# Patient Record
Sex: Female | Born: 1950
Health system: Southern US, Community
[De-identification: ages and names within clinical notes are randomized; demographics above are authoritative.]

## PROBLEM LIST (undated history)

## (undated) DIAGNOSIS — J302 Other seasonal allergic rhinitis: Secondary | ICD-10-CM

## (undated) DIAGNOSIS — I10 Essential (primary) hypertension: Secondary | ICD-10-CM

## (undated) DIAGNOSIS — C801 Malignant (primary) neoplasm, unspecified: Secondary | ICD-10-CM

## (undated) DIAGNOSIS — M5430 Sciatica, unspecified side: Secondary | ICD-10-CM

## (undated) DIAGNOSIS — M1990 Unspecified osteoarthritis, unspecified site: Secondary | ICD-10-CM

## (undated) DIAGNOSIS — K5792 Diverticulitis of intestine, part unspecified, without perforation or abscess without bleeding: Secondary | ICD-10-CM

## (undated) DIAGNOSIS — F32A Depression, unspecified: Secondary | ICD-10-CM

## (undated) DIAGNOSIS — R82998 Other abnormal findings in urine: Secondary | ICD-10-CM

## (undated) DIAGNOSIS — B019 Varicella without complication: Secondary | ICD-10-CM

## (undated) DIAGNOSIS — E079 Disorder of thyroid, unspecified: Secondary | ICD-10-CM

## (undated) DIAGNOSIS — F329 Major depressive disorder, single episode, unspecified: Secondary | ICD-10-CM

## (undated) DIAGNOSIS — I639 Cerebral infarction, unspecified: Secondary | ICD-10-CM

## (undated) HISTORY — DX: Disorder of thyroid, unspecified: E07.9

## (undated) HISTORY — DX: Varicella without complication: B01.9

## (undated) HISTORY — DX: Other abnormal findings in urine: R82.998

## (undated) HISTORY — DX: Other seasonal allergic rhinitis: J30.2

## (undated) HISTORY — DX: Malignant (primary) neoplasm, unspecified: C80.1

## (undated) HISTORY — DX: Diverticulitis of intestine, part unspecified, without perforation or abscess without bleeding: K57.92

## (undated) HISTORY — DX: Depression, unspecified: F32.A

## (undated) HISTORY — DX: Unspecified osteoarthritis, unspecified site: M19.90

## (undated) HISTORY — DX: Major depressive disorder, single episode, unspecified: F32.9

## (undated) HISTORY — PX: WISDOM TOOTH EXTRACTION: SHX21

## (undated) HISTORY — DX: Cerebral infarction, unspecified: I63.9

---

## 1977-03-15 HISTORY — PX: MINOR BREAST BIOPSY: SHX5977

## 2002-07-16 ENCOUNTER — Emergency Department (HOSPITAL_COMMUNITY): Admission: EM | Admit: 2002-07-16 | Discharge: 2002-07-16 | Payer: Self-pay | Admitting: Emergency Medicine

## 2003-04-13 ENCOUNTER — Observation Stay (HOSPITAL_COMMUNITY): Admission: AD | Admit: 2003-04-13 | Discharge: 2003-04-14 | Payer: Self-pay | Admitting: Family Medicine

## 2003-05-15 ENCOUNTER — Encounter: Admission: RE | Admit: 2003-05-15 | Discharge: 2003-05-15 | Payer: Self-pay | Admitting: Family Medicine

## 2008-09-12 DIAGNOSIS — I639 Cerebral infarction, unspecified: Secondary | ICD-10-CM

## 2008-09-12 HISTORY — DX: Cerebral infarction, unspecified: I63.9

## 2008-09-24 ENCOUNTER — Ambulatory Visit: Payer: Self-pay | Admitting: Cardiology

## 2008-09-24 ENCOUNTER — Inpatient Hospital Stay (HOSPITAL_COMMUNITY): Admission: EM | Admit: 2008-09-24 | Discharge: 2008-09-27 | Payer: Self-pay | Admitting: Emergency Medicine

## 2008-09-25 ENCOUNTER — Encounter (INDEPENDENT_AMBULATORY_CARE_PROVIDER_SITE_OTHER): Payer: Self-pay | Admitting: Internal Medicine

## 2008-09-25 ENCOUNTER — Encounter (INDEPENDENT_AMBULATORY_CARE_PROVIDER_SITE_OTHER): Payer: Self-pay | Admitting: Emergency Medicine

## 2008-09-25 ENCOUNTER — Encounter (INDEPENDENT_AMBULATORY_CARE_PROVIDER_SITE_OTHER): Payer: Self-pay | Admitting: Neurology

## 2008-09-26 ENCOUNTER — Ambulatory Visit: Payer: Self-pay | Admitting: Surgery

## 2008-09-26 ENCOUNTER — Encounter (INDEPENDENT_AMBULATORY_CARE_PROVIDER_SITE_OTHER): Payer: Self-pay | Admitting: Internal Medicine

## 2008-10-02 ENCOUNTER — Telehealth (INDEPENDENT_AMBULATORY_CARE_PROVIDER_SITE_OTHER): Payer: Self-pay

## 2008-10-03 ENCOUNTER — Ambulatory Visit: Payer: Self-pay

## 2008-10-03 ENCOUNTER — Encounter: Payer: Self-pay | Admitting: Cardiology

## 2008-10-04 ENCOUNTER — Telehealth (INDEPENDENT_AMBULATORY_CARE_PROVIDER_SITE_OTHER): Payer: Self-pay | Admitting: *Deleted

## 2008-12-02 DIAGNOSIS — J309 Allergic rhinitis, unspecified: Secondary | ICD-10-CM | POA: Insufficient documentation

## 2008-12-04 ENCOUNTER — Ambulatory Visit: Payer: Self-pay | Admitting: Cardiology

## 2008-12-04 DIAGNOSIS — I214 Non-ST elevation (NSTEMI) myocardial infarction: Secondary | ICD-10-CM

## 2008-12-04 DIAGNOSIS — I252 Old myocardial infarction: Secondary | ICD-10-CM | POA: Insufficient documentation

## 2008-12-04 DIAGNOSIS — E669 Obesity, unspecified: Secondary | ICD-10-CM | POA: Insufficient documentation

## 2009-02-03 ENCOUNTER — Encounter: Admission: RE | Admit: 2009-02-03 | Discharge: 2009-02-03 | Payer: Self-pay | Admitting: Family Medicine

## 2009-02-18 ENCOUNTER — Ambulatory Visit: Payer: Self-pay | Admitting: Cardiology

## 2009-02-19 ENCOUNTER — Telehealth (INDEPENDENT_AMBULATORY_CARE_PROVIDER_SITE_OTHER): Payer: Self-pay | Admitting: *Deleted

## 2009-06-03 ENCOUNTER — Encounter: Admission: RE | Admit: 2009-06-03 | Discharge: 2009-06-03 | Payer: Self-pay | Admitting: Family Medicine

## 2009-06-19 ENCOUNTER — Ambulatory Visit: Payer: Self-pay | Admitting: Cardiology

## 2009-10-24 ENCOUNTER — Ambulatory Visit: Payer: Self-pay | Admitting: Cardiology

## 2009-10-24 DIAGNOSIS — E785 Hyperlipidemia, unspecified: Secondary | ICD-10-CM | POA: Insufficient documentation

## 2010-04-14 NOTE — Assessment & Plan Note (Signed)
Summary: 3 month rov 401.1  pfh,rn   Visit Type:  Follow-up Primary Provider:  None  CC:  HTN.  History of Present Illness: The patient presents for follow up of hypertension. Since I last saw her she says that her home blood pressure readings have been in the 130s over 80s. She has had no chest pressure, neck or arm discomfort. She has had no palpitations, presyncope or syncope. She has had no PND or orthopnea. She has been through the stress of the death of her mother and says that at times her blood pressures been creeping up a little bit.  Current Medications (verified): 1)  Hydrochlorothiazide 25 Mg Tabs (Hydrochlorothiazide) .... Daily As Needed Per Pt 2)  Bystolic 10 Mg Tabs (Nebivolol Hcl) .... One Daily 3)  Vitamin D3 400 Unit Tabs (Cholecalciferol) .Marland Kitchen.. 1 By Mouth Daily 4)  Coq10 30 Mg Caps (Coenzyme Q10) .Marland Kitchen.. 1 By Mouth Dialy 5)  Vitamin C 500 Mg  Tabs (Ascorbic Acid) .Marland Kitchen.. 1 By Mouth Daily 6)  Chlorella .Marland Kitchen.. 1 Tsp. Daily  Allergies (verified): 1)  ! Penicillin 2)  ! Sulfa  Past History:  Past Medical History: Reviewed history from 12/04/2008 and no changes required. 1. Acute left-sided cerebrovascular accident. (2010) 2. Dyslipidemia. 3. Elevated troponin. 4. Hypertensive urgency (accelerated hypertension). 5. Hypokalemia. 6. Elevated TSH. 7. Obesity. 8. LVH    Past Surgical History: Reviewed history from 12/02/2008 and no changes required.  Prior breast biopsy.   Review of Systems       As stated in the HPI and negative for all other systems.   Vital Signs:  Patient profile:   60 year old female Height:      64 inches Weight:      197 pounds BMI:     33.94 Pulse rate:   62 / minute Resp:     18 per minute BP sitting:   148 / 98  (right arm)  Vitals Entered By: Marrion Coy, CNA (October 24, 2009 9:52 AM)  Physical Exam  General:  Well developed, well nourished, in no acute distress. Head:  normocephalic and atraumatic Eyes:  PERRLA/EOM intact;  conjunctiva and lids normal. Neck:  Neck supple, no JVD. No masses, thyromegaly or abnormal cervical nodes. Chest Wall:  no deformities or breast masses noted Lungs:  Clear bilaterally to auscultation and percussion. Heart:  Non-displaced PMI, chest non-tender; regular rate and rhythm, S1, S2 without murmurs, rubs or gallops. Carotid upstroke normal, no bruit. Normal abdominal aortic size, no bruits. Femorals normal pulses, no bruits. Pedals normal pulses. No edema, no varicosities. Abdomen:  Bowel sounds positive; abdomen soft and non-tender without masses, organomegaly, or hernias noted. No hepatosplenomegaly, obese Msk:  Back normal, normal gait. Muscle strength and tone normal. Extremities:  No clubbing or cyanosis. Neurologic:  Alert and oriented x 3. Skin:  Intact without lesions or rashes. Cervical Nodes:  no significant adenopathy Psych:  Normal affect.   EKG  Procedure date:  10/24/2009  Findings:      Sinus rhythm, rate 62, left ventricular hypertrophy, premature and treated in traction, poor anterior R-wave progression  Impression & Recommendations:  Problem # 1:  HYPERTENSION (ICD-401.9) She says that on occasional weeks her blood pressure may be elevated slightly. I discussed with her possibly taking 12.5 mg of HCTZ in addition to her other meds on those weeks and watching her salt. She'll keep a close eye on her blood pressure and use this therapy as needed. Hopefully if she continues to lose  weight and exercise blood pressures will continue to be at target. Orders: EKG w/ Interpretation (93000)  Problem # 2:  OBESITY, UNSPECIFIED (ICD-278.00) I applaud her weight loss to date and encourage more of the same.  Problem # 3:  DYSLIPIDEMIA (ICD-272.4) She will get a lipid profile in about 6 months with liver enzymes. This will be either prior to or at the time of the next appointment.  Patient Instructions: 1)  Your physician recommends that you schedule a follow-up  appointment in: 6 months with Dr Antoine Poche 2)  Your physician recommends that you continue on your current medications as directed. Please refer to the Current Medication list given to you today.

## 2010-04-14 NOTE — Assessment & Plan Note (Signed)
Summary: per check out/sf   Visit Type:  Follow-up Primary Provider:  None  CC:  HTN.  History of Present Illness: The patient presents for followup of hypertension. Since the last appointment her blood pressure has predominantly been better controlled though she reports systolics in the 150s typically. She overall is feeling better. She's been exercising. She been watching her diet and has lost 7 pounds. She does notice that if she eats salt her blood pressure tends to go up. She is having no new chest pressure, neck or arm discomfort. She's having no palpitations, presyncope or syncope. She is having no PND or orthopnea. She tolerated the increased dose of diastolic though she notices her heart rate staying in the 50s. She's not having symptoms related to this.  Current Medications (verified): 1)  Simvastatin 10 Mg Tabs (Simvastatin) .... Daily 2)  Hydrochlorothiazide 25 Mg Tabs (Hydrochlorothiazide) .... Daily As Needed Per Pt 3)  Aspirin 325 Mg  Tabs (Aspirin) .... Daily 4)  Bystolic 10 Mg Tabs (Nebivolol Hcl) .... One Daily  Allergies (verified): 1)  ! Penicillin 2)  ! Sulfa  Past History:  Past Medical History: Reviewed history from 12/04/2008 and no changes required. 1. Acute left-sided cerebrovascular accident. (2010) 2. Dyslipidemia. 3. Elevated troponin. 4. Hypertensive urgency (accelerated hypertension). 5. Hypokalemia. 6. Elevated TSH. 7. Obesity. 8. LVH    Past Surgical History: Reviewed history from 12/02/2008 and no changes required.  Prior breast biopsy.   Review of Systems       As stated in the HPI and negative for all other systems.   Vital Signs:  Patient profile:   60 year old female Height:      64 inches Weight:      198 pounds BMI:     34.11 Pulse rate:   76 / minute BP sitting:   153 / 95 Cuff size:   large  Vitals Entered By: Burnett Kanaris, CNA (June 19, 2009 3:24 PM)  Physical Exam  General:  Well developed, well nourished, in no  acute distress. Head:  normocephalic and atraumatic Eyes:  PERRLA/EOM intact; conjunctiva and lids normal. Neck:  Neck supple, no JVD. No masses, thyromegaly or abnormal cervical nodes. Chest Wall:  no deformities or breast masses noted Lungs:  Clear bilaterally to auscultation and percussion. Abdomen:  Bowel sounds positive; abdomen soft and non-tender without masses, organomegaly, or hernias noted. No hepatosplenomegaly. Msk:  Back normal, normal gait. Muscle strength and tone normal. Extremities:  No clubbing or cyanosis. Neurologic:  Alert and oriented x 3. Psych:  Normal affect.   Detailed Cardiovascular Exam  Heart    Inspection: no deformities or lifts noted.      Palpation: normal PMI with no thrills palpable.      Auscultation: S1 and S2 within normal limits, no S3, positive S4, no clicks, no rubs, no murmurs.  Vascular    Abdominal Aorta: no palpable masses, pulsations, or audible bruits.      Femoral Pulses: normal femoral pulses bilaterally.      Pedal Pulses: pulses normal in all 4 extremities    Radial Pulses: normal radial pulses bilaterally.      Peripheral Circulation: no clubbing, cyanosis, or edema noted with normal capillary refill.     EKG  Procedure date:  06/19/2009  Findings:      sinus rhythm, rate 76, left axis deviation, left ventricular hypertrophy with repolarization changes, poor anterior R-wave progression, no change from previous  Impression & Recommendations:  Problem # 1:  HYPERTENSION (ICD-401.9) We had a long discussion about this. She does not want to take another medication at this point and wants very much to pursue lifestyle changes to treat her blood pressure. We discussed strategies for this. If in 3 months she has not had a target of 140/90 or less I will add another agent. Orders: EKG w/ Interpretation (93000)  Problem # 2:  OBESITY, UNSPECIFIED (ICD-278.00) We discussed weight loss strategies and I'm proud of her progress to  date.  Patient Instructions: 1)  Your physician recommends that you schedule a follow-up appointment in: 3 months with Dr Antoine Poche 2)  Your physician recommends that you continue on your current medications as directed. Please refer to the Current Medication list given to you today.

## 2010-06-21 LAB — POCT I-STAT, CHEM 8
BUN: 17 mg/dL (ref 6–23)
Chloride: 104 mEq/L (ref 96–112)
Potassium: 3.1 mEq/L — ABNORMAL LOW (ref 3.5–5.1)
Sodium: 141 mEq/L (ref 135–145)

## 2010-06-21 LAB — POCT CARDIAC MARKERS
CKMB, poc: 2.4 ng/mL (ref 1.0–8.0)
Myoglobin, poc: 158 ng/mL (ref 12–200)
Troponin i, poc: 0.08 ng/mL (ref 0.00–0.09)

## 2010-06-21 LAB — DIFFERENTIAL
Basophils Absolute: 0 10*3/uL (ref 0.0–0.1)
Lymphocytes Relative: 19 % (ref 12–46)
Monocytes Absolute: 0.8 10*3/uL (ref 0.1–1.0)
Neutro Abs: 7.7 10*3/uL (ref 1.7–7.7)

## 2010-06-21 LAB — LIPID PANEL
Cholesterol: 200 mg/dL (ref 0–200)
HDL: 61 mg/dL (ref >39)
Triglycerides: 84 mg/dL (ref <150)

## 2010-06-21 LAB — CK TOTAL AND CKMB (NOT AT ARMC): Total CK: 579 U/L — ABNORMAL HIGH (ref 7–177)

## 2010-06-21 LAB — MAGNESIUM: Magnesium: 2.3 mg/dL (ref 1.5–2.5)

## 2010-06-21 LAB — CBC
Hemoglobin: 14.3 g/dL (ref 12.0–15.0)
Platelets: 170 10*3/uL (ref 150–400)
RDW: 13.5 % (ref 11.5–15.5)

## 2010-06-21 LAB — CARDIAC PANEL(CRET KIN+CKTOT+MB+TROPI)
Relative Index: 0.6 (ref 0.0–2.5)
Troponin I: 0.32 ng/mL — ABNORMAL HIGH (ref 0.00–0.06)

## 2010-06-21 LAB — SEDIMENTATION RATE: Sed Rate: 2 mm/hr (ref 0–22)

## 2010-06-21 LAB — TSH: TSH: 8.145 u[IU]/mL — ABNORMAL HIGH (ref 0.350–4.500)

## 2010-06-21 LAB — POTASSIUM: Potassium: 3.3 mEq/L — ABNORMAL LOW (ref 3.5–5.1)

## 2010-07-28 NOTE — Discharge Summary (Signed)
[name], [name]       ACCOUNT NO.:  [id number]   MEDICAL RECORD NO.:  [id number]          PATIENT TYPE:  INP   LOCATION:  3701                         FACILITY:  [facility]   PHYSICIAN:  [name], MD    DATE OF BIRTH:  31-Jan-1951   DATE OF ADMISSION:  09/24/2008  DATE OF DISCHARGE:  09/27/2008                               DISCHARGE SUMMARY   REASON FOR ADMISSION:  Right-sided numbness and peripheral visual loss  in the right eye.   FINAL DISCHARGE DIAGNOSES:  1. Acute left-sided cerebrovascular accident.  2. Dyslipidemia.  3. Elevated troponin.  4. Hypertensive urgency (accelerated hypertension).  5. Hypokalemia.  6. Elevated TSH.  7. Obesity.   CONSULTS DURING THIS ADMISSION:  1. Cardiology consult for elevated troponin.  2. Neurology consult for acute CVA.   PROCEDURES DONE DURING THIS ADMISSION:  CT scan of the head without  contrast was read as having no acute intracranial abnormalities, but  extensive chronic microvascular white matter disease.  There was an old  superior cerebellar infarct and old left basal ganglia lacunar infarct.  MRI of the brain was done subsequent to that and it showed acute infarct  in the left lateral thalamus.  A 2-D echocardiogram was done and showed  65% ejection fraction with no obvious cardiac source of emboli.  Carotid  Doppler was done and showed no significant extracranial carotid artery  stenosis.  Vertebral arteries were patent bilaterally with antegrade  flow.   BRIEF HISTORY OF PRESENT ILLNESS AND HOSPITAL COURSE:  This is a 60-year-  old African American female with a history of untreated hypertension who  came in to the emergency room with complaint of right-sided numbness of  both right upper and lower extremities and also face with peripheral  visual loss on the right eye, and it started 1 hour prior to  presentation in the emergency room.  As noted above, MRI did reveal that  the patient had an acute left  thalamus infarction and the patient was  treated for this.  The patient started on aspirin prior to diagnosis and  this was continued.  Zocor was added because of the patient's  dyslipidemia and her blood pressure was controlled with initiation of  Norvasc and hydrochlorothiazide.  The patient seen by Cardiology because  of elevated troponin and recommendation was for the patient to undergo a  left cardiac cath considering all her risk factors, but she declined  having a left heart catheterization.  A stress Cardiolite study was  therefore recommended.  However, because this could not be done today,  the patient says that she would like to go home and have this done in  the outpatient setting with [facility].  For re-culture, I told  the physician assistant with [facility] by name [name] and she  has assured me that [facility] would contact this patient and  schedule a stress Cardiolite study in the outpatient setting.  We also  consulted Neurology and the opinion was that the patient should also  undergo an MRA of the cranial arteries.  The patient has however  declined any further study at this time.  She has declined the MRA of  the spine, my explanations as to why to help [name] with further management,  and she has also declined even a transcranial Dopplers at this time.  She says that she is tired of being in the hospital and would like to go  home and she is not going to undergo any more study.  She also knows the  fact that she is a claustrophobic to the MRI machine.  Today, the  patient says that she still has slight numbness in the right arm even  though significantly improved from presentation.  Because of this, I  offered for her to stay and have more workup done, especially with the  MRA, but she declined to staying any further.  Because she is not  willing to have any more studies done, we are forced to discharge at  this time, which is actually her wish.   She has no other symptoms.  No  chest pain.  No shortness of breath.  No abdominal pain.  No nausea.  No  vomiting.  No diarrhea.  No constipation.  No dysuria.  No frequency.  No urgency.  She was noted to have elevated TSH, but the free T4 and T3  has not been resulted here.  So, tentatively the patient has  hypothyroidism, but we will be able to diagnose this better when we have  back these two studies.   PHYSICAL EXAMINATION:  VITALS:  Temperature 97.6, pulse 75, respirations  18, blood pressure 130/85, saturating 99% on room air.  CHEST:  Clear to auscultation bilaterally.  ABDOMEN:  Soft, nontender.  EXTREMITIES:  No clubbing, no cyanosis, no edema.  CARDIOVASCULAR:  First and second heart sounds only.  CENTRAL NERVOUS SYSTEM:  Nonfocal.  Cranial nerves intact II through  XII.  There is slight tingling on the right side, but no loss of  sensation.   MEDICATIONS:  She is to go home on the following medications,  1. Norvasc 10 mg p.o. daily.  2. Hydrochlorothiazide 25 mg p.o. daily.  3. Zocor 10 mg p.o. daily.  4. Aspirin 325 mg p.o. daily.   FOLLOWUP:  She is to follow up with primary care physician in 1-2 weeks  with the help of social workers.  She is also to follow with neurologist  Dr. [name] in 2 weeks.  The patient is to call for this appointment.  She is to follow with [facility] for outpatient stress test.  According to my discussion with the physician assistant, Rosalyn Charters  Cardiology would arrange for this stress test and the patient will be  contacted by them.      [name], MD  Electronically Signed     GU/MEDQ  D:  09/27/2008  T:  09/28/2008  Job:  [id number]   cc:   Hillis Range, MD  Dr. Pearlean Brownie

## 2010-07-28 NOTE — H&P (Signed)
[name], [name]       ACCOUNT NO.:  [id number]   MEDICAL RECORD NO.:  [id number]          PATIENT TYPE:  INP   LOCATION:  3704                         FACILITY:  [facility]   PHYSICIAN:  Oswald Hillock, MD        DATE OF BIRTH:  [birth year]   DATE OF ADMISSION:  09/24/2008  DATE OF DISCHARGE:                              HISTORY & PHYSICAL   CHIEF COMPLAINT:  Right-sided numbness and peripheral vision loss in  right eye.   HISTORY OF PRESENT ILLNESS:  The patient is a 60 year old African  American female with history of untreated hypertension who presented to  the emergency room with complaints of right-sided numbness both right  upper as well as lower extremity including the face and associated with  peripheral vision loss in the right eye that started about 1 hour prior  to her arrival in the emergency room.  Apparently, the patient was  exercising at that time.  Denied any chest pain, shortness of breath,  palpitations, dizziness, loss of consciousness, or any focal weakness of  any part of the body.  The numbness that she describes is just a heavy  feeling with associated tingling.  The patient has never had similar  symptoms in the past.   EMERGENCY DEPARTMENT COURSE:  Dr. Doug Sou, the ER physician  evaluated the patient and had CT done, which revealed extensive chronic  microvascular white matter disease and mild cerebellar atrophy, old  superior cerebral artery infarct, and old left basal ganglia lacune.  Her initial blood pressure was 197/114, which later on improved and as  her blood pressure improved, her symptoms got better.  However, at the  time of this interview, her blood pressure is back up to 209/119 and she  is getting recurrence of her symptoms, though her vision is intact at  this time.  She denies having had similar symptoms in the past, states  that she has been otherwise healthy.  She did have an admission in the  hospital here in 2005 for  diagnosis of chest pain.   PAST MEDICAL HISTORY:  Hypertension.  The patient had been on  hydrochlorothiazide in the past, but has stopped taking it.   PAST SURGICAL HISTORY:  History of breast biopsy.   CURRENT MEDICATIONS:  None.   ALLERGIES:  PENICILLIN.   SOCIAL HISTORY:  The patient denies alcohol, tobacco, or drug use.  She  lives with the family.  She moved to [place] from [place].   FAMILY HISTORY:  Father has history of hypertension and kidney stone.  Her mother has congestive heart failure, hypertension, diabetes, and  osteoarthritis and siblings have asthma, diabetes, and hypertension.   REVIEW OF SYSTEMS:  An extensive system review of systems was done and  all systems are negative except for the positives as mentioned in the  history of present illness.  The patient reports of some weight gain in  the recent past.   PHYSICAL EXAMINATION:  VITAL SIGNS:  On admission, pulse 104, blood  pressure 190/114, respiratory rate of 14, and temperature 98.1 and O2  sats of 99% on 2 L nasal cannula.  At the  time of this interview, her  blood pressure is 209/119.  GENERAL:  The patient is consciousness, alert, oriented to time, place,  and person, in no acute distress.  HEENT:  No scleral icterus.  No pallor.  Ears, negative.  Poor dental  hygiene.  NECK:  Supple.  No lymphadenopathy.  No JVD.  No carotid bruits.  CHEST:  Breath sounds heard bilaterally.  Good air entry.  No  adventitious sounds.  CVS:  S1 and S2 plus regular.  No gallop or rub.  ABDOMEN:  Soft and nontender.  Bowel sounds present.  EXTREMITIES:  No cyanosis, clubbing, or edema.  NEUROLOGIC:  The patient's fundus exam did not reveal any acute  hemorrhages.  The patient does have intact vision at the time of this  interview in both eyes.  No upper and cranial nerve palsy.  Motor  examination, power grade 5/5 all over.  Reflexes are present.  Plantars  are bilaterally downgoing.  Sensations are present to  both light touch  and pain.  The patient still complained of a right-sided numbness and  tingling at the time of this interview.  Gait was within normal limits.   LABORATORY DATA:  CT scan of the of the head revealed extensive chronic  microvascular white disease and mild cerebellar atrophy.   Old superior cerebral artery infarct in old left basal ganglia lacune.  CK was 158, MB 2.4, troponin 0.08.  sodium 141, potassium 3.1, chloride  104, CO2 is 26, BUN 17, creatinine 1.2, WBC count 1.07, hemoglobin 14.3,  hematocrit 41.7, platelet count of 170.  EKG showed sinus tach and left  axis deviation, LVH and nonspecific ST-T wave changes.  No ST elevation  noted.  Significant changes of LVH and some T-wave changes compared with  previous EKGs.   IMPRESSION AND PLAN:  This is a case of 60 year old African American  female with history of untreated hypertension who presents with sudden  onset of right-sided numbness and peripheral vision loss in the right  eye.  1. Right numbness, peripheral vision loss and uncontrolled      hypertension.  There is concern for hypertensive encephalopathy,      given the transient nature of the symptoms and relation to elevated      blood pressure as mentioned above.  The patient's symptoms have      recurred after her blood pressure has gone back up.  We will start      her on a nicardipine drip with goal to titrate her blood pressure      down to 160-170 systolic and 100-110 diastolic at this time.  She      has already received aspirin in the ER and this case was discussed      with the E- link doctor on call for the critical care medicine and      he is in agreement with this.  We will start baseline stroke workup      and get an MRI of the brain and get baseline labs including ESR,      CRP as well as a TSH, fasting lipid panel, hemoglobin A1c, and      homocysteine level.  We will check bilateral carotid Dopplers with      2D echocardiogram and  transcranial Dopplers and followup with her      results.  Neurology will need to be consulted in the morning.  2. Nonspecific EKG changes.  We will admit the patient to telemetry,  recycle enzymes, and followup.  3. Hypokalemia.  The patient received supplementation in the ER, we      will monitor.  4. Deep venous thrombosis/gastrointestinal prophylaxis.  Protonix and      subcu heparin.  5. History of old cerebrovascular accidents with low deficits.  The      patient does have clear old cerebellar and old left basal ganglia      lacunar infarct.  However, she states that she has never had any      symptoms as a result.  As mentioned above, extensive stroke workup      will be done and the patient will be started on aspirin and we may      need to add Plavix to her regimen as well.   Also, case was discussed in detail with Dr. Ethelda Chick, the ER  physician, who refused to consult Neurology.  I discussed the case in  detail with the critical care doctor on call for eLink, and he will  monitor the patient in the ICU.      Oswald Hillock, MD  Electronically Signed     BA/MEDQ  D:  09/24/2008  T:  09/25/2008  Job:  [id number]   cc:   Doug Sou, M.D.  Primcary Care Physician

## 2010-07-28 NOTE — Consult Note (Signed)
NAME:  [name], [name] NO.:  [id number]   MEDICAL RECORD NO.:  [id number]          PATIENT TYPE:  INP   LOCATION:  3704                         FACILITY:  [facility]   PHYSICIAN:  Hillis Range, MD       DATE OF BIRTH:  [birth year]   DATE OF CONSULTATION:  DATE OF DISCHARGE:                                 CONSULTATION   REQUESTING PHYSICIAN:  Hospitalist Team E.   HISTORY OF PRESENT ILLNESS:  [name] is a 60 year old  female with a history of hypertension and obesity who was admitted with  an acute stroke and observed to have elevated cardiac markers.  The  patient reports living a very sedentary lifestyle, but recently has  desired to become more active.  She has had intermittent shortness of  breath chronically.  Over the past few days, she has attempted to do  exercise.  She notes that on July 13 she participated in an exercise  class, which involved sprinting, walking, and running.  She notes that  she developed recurrent shortness of breath during the exercise and had  to drop out of activities on multiple occasions.  She denies chest pain,  arm/jaw pain or other cardiac symptoms.  She subsequently developed  unsteadiness when walking and visual disturbance.  She was brought to  the [facility] for further evaluation.  Upon arrival, she  developed peripheral vision loss within the right eye.  She was found to  have an acute stroke.  Her initial blood pressure was 200/110.  An EKG  at that time revealed sinus rhythm with left ventricular hypertrophy,  septal infarct with ST elevation in V1 through V3.  The patient denies  any symptoms of chest pain or shortness of breath at that particular  time.  She was admitted for further management.  Her neurologic symptoms  have resolved and she has been treated with aspirin.  She is now resting  comfortably.  She denies chest pain, further shortness of breath,  palpitations, presyncope, or syncope.  She  has chronic intermittent  lower extremity edema, but denies orthopnea or PND.   PAST MEDICAL HISTORY:  1. Hypertension.  2. Allergic rhinitis.   SURGERIES:  Prior breast biopsy.   ALLERGIES:  PENICILLIN.   HOME MEDICATIONS:  None.   SOCIAL HISTORY:  The patient lives in [place].  She is widowed.  She  denies tobacco, alcohol, or drug use.  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   FAMILY HISTORY:  The patient has multiple family members with diabetes  and hypertension.  Her mother had congestive heart failure in her 4s.  The patient is unaware of any significant family history of premature  coronary artery disease.   REVIEW OF SYSTEMS:  All systems are reviewed and negative except as  outlined in the HPI above.   PHYSICAL EXAMINATION:  Telemetry reveals sinus rhythm.  VITAL SIGNS:  Blood pressure 147/101, heart rate 82, respirations 18,  and sats 97% on room air, afebrile.  GENERAL:  The patient is an obese female who is  anxious, but in no acute  distress.  She is alert and oriented x3.  HEENT:  Normocephalic and atraumatic.  Sclerae clear.  Conjunctivae  pink.  Oropharynx clear.  NECK:  Supple.  No thyromegaly, JVD, or bruits.  LUNGS:  Clear to auscultation bilaterally.  HEART:  Regular rate and rhythm.  No murmurs, rubs, or gallops.  GASTROINTESTINAL:  Soft, nontender, nondistended.  Positive bowel  sounds.  EXTREMITIES:  No clubbing, cyanosis, or edema.  NEUROLOGIC:  Strength and sensation are intact.  SKIN:  No ecchymosis or lacerations.  MUSCULOSKELETAL:  No deformity or atrophy.  PSYCHIATRIC:  Euthymic mood.  Flat affect.   EKG today revealed sinus rhythm at 90 beats per minute with left  ventricular hypertrophy and early repolarization abnormalities.  I  reviewed her admission EKG, which reveals as described above sinus  rhythm with left ventricular hypertrophy.  She has a septal  infarct  pattern with ST elevation in V1 through V3 of unclear significance.   LABORATORY DATA:  Peak troponin 0.48, peak CK 579, peak MB 4.1, total  cholesterol 200, triglycerides 84, LDL 122, HDL 61, TSH 8.145, and  hemoglobin A1c 5.1.   Echocardiogram reveals a preserved ejection fraction with moderate left  ventricular hypertrophy.   Chest x-ray has not been performed.  A head CT from September 24, 2008,  reveals extensive microvascular disease and mild cerebral hypertrophy  and old superior cerebellar infarct and old left basal ganglia lacune is  noted.   MRI from September 25, 2008, reveals an acute infarct in the left lateral  thalamus with moderately severe chronic microvascular ischemia in the  white matter.   IMPRESSION:  [name] is a 60 year old female with obesity  and hypertension who is admitted with an acute stroke.  She reports  symptoms of shortness of breath over the past few days, but has not had  chest discomfort.  During her hospital stay, her cardiac enzymes have  revealed a troponin of 0.48.  Though, she has had elevation of her CK  and CK-MB, these are not of a traditional cardiac origin.  The patient  has moderate left ventricular hypertrophy, but a preserved ejection  fraction.  She has multiple risk factors for coronary artery disease  including a sedentary lifestyle, obesity, and hypertension.  Given the  patient's elevated troponin in the setting of hypertensive urgency and  an acute stroke, I am concerned for coronary artery disease.  She also  has cerebrovascular disease previously documented.  I have recommended  left heart catheterization to further evaluate the patient's coronary  anatomy.  We had a long discussion regarding risks, benefits, and  alternatives to left heart catheterization.  Presently, the patient  declines catheterization, but would be willing to proceed with a stress  test.  If her exercise stress test is high risk then she  might consider  heart catheterization; however, if it is low risk then she would opt for  medical therapy at this time.  We will ask Neurology for their input  regarding the patient's recent stroke and appropriate therapies.  We  will also ask them to advice [name] regarding the timing of her stress test.  The patient has been initiated on Norvasc for her hypertension and also  Zocor for elevation in her LDL.  We will follow the patient during her  hospital stay.   PLAN:  1. GXT Myoview.  2. Norvasc 10 mg daily.  3. Aspirin 325 mg daily.  4. Zocor 10 mg at  bedtime.  5. Neurologic consultation.      Hillis Range, MD  Electronically Signed     JA/MEDQ  D:  09/26/2008  T:  09/26/2008  Job:  [id number]   cc:   Hospitalist Service

## 2010-07-28 NOTE — Consult Note (Signed)
[name], [name]       ACCOUNT NO.:  [id number]   MEDICAL RECORD NO.:  [id number]          PATIENT TYPE:  INP   LOCATION:  3704                         FACILITY:  [facility]   PHYSICIAN:  Casimiro Needle L. Reynolds, M.D.DATE OF BIRTH:  19-Mar-1950   DATE OF CONSULTATION:  09/26/2008  DATE OF DISCHARGE:                                 CONSULTATION   CHIEF COMPLAINT:  Right-sided weakness with left thalamic  cerebrovascular accident.   HISTORY OF PRESENT ILLNESS:  This is a 60 year old African American  female with untreated hypertension.  The patient was presented  previously on hydrochlorothiazide, but stopped this medication on her  own in the past.  Apparently, the patient on September 24, 2008 was taking  part in a boot camp at her gym at that time they were doing strenuous  exercise.  The patient's class started about 6 p.m. and the patient  states that she was last felt normal was 6:15 p.m.  At approximately  6:15, she noticed that she was also feeling as though she was having  high blood pressure, sweating profusely and did not feel at baseline.  She pulled herself out of the class, kneel down at the edges of the gym  for approximately 1 minute.  At that time, the patient noticed that her  right visual field became blurry and she started seeing black spots in  her right visual field.  The patient was then escorted to a couch where  she laid down put her feet up.  Soon after she noted that her right side  was tingling from the right lower face down to her right foot.  At that  time, EMS was called.  Due to the patient's symptoms not improving, she  was brought to the emergency room.  At the emergency room, the patient  states that she was unable to read the right aspect of a chart placed in  front of her on arrival; however, within 30 to 40-minute, she states her  visual field came back.  Unfortunately her right-sided numbness did not  improve.  The patient at that time was admitted.   On consultation, the  patient is walking around the room.  She states her visual field has no  deficits.  She is left with a slight tingling in her right fingertips  and right toes and the right side of her face does not feel back to  baseline.  It is noted the patient has no dysarthria.  Her gait is  within normal limits and she is able to do fine motor movements that she  is speaking up washcloths and placing pens on her table.   PAST MEDICAL HISTORY:  1. Hypertension.  2. Breast biopsy.  3. History of stroke of unknown timing.   Stroke risk factors include hypertension, hyperlipidemia, obesity.   MEDICATIONS AT HOME:  She was on no medications, did not take aspirin.  At this time, the patient is on amlodipine 10 mg, aspirin 325 mg daily,  heparin 5000 units q.8 h., Protonix 40 mg q.12 h., hydralazine 10 mg q.6  h.   ALLERGIES:  PENICILLIN.   FAMILY HISTORY:  Father had hypertension and kidney stones.  Mother had  congestive heart failure, hypertension, and diabetes.  Siblings have  asthma, diabetes, and hypertension.   SOCIAL HISTORY:  She lives with her family.  She states she does not  drink, smoke, or do illicit drugs.   REVIEW OF SYSTEMS:  Negative except for the above.   PHYSICAL EXAMINATION:  VITAL SIGNS:  Temperature 97.8, blood pressure  147/101, heart rate is 80, respiration 16, O2 sats 100%.  GENERAL:  Lungs are clear to auscultation bilaterally.  No rhonchi or  wheezing.  ABDOMEN:  Soft, nontender, nondistended.  Bowel sounds in all four  quadrants.  CARDIOVASCULAR:  Regular rate and rhythm.  S1 and S2 audible.  No bruits  audible.  NECK:  Negative for bruits bilaterally.  NEUROLOGIC:  Cranial nerves II through XII are intact.  Pupils are  equal, round, and reactive accommodating to light.  Extraocular muscles  are intact.  The patient has no facial droop.  Sensation V1 to V3  bilaterally, grossly intact.  Tongue is midline.  The patient is  negative for  drift upper or lower extremities bilaterally.  Muscle  strength is 5/5 globally.  Finger-to-nose, heel-to-shin is smooth  bilaterally.  Sensation, the patient notes a decrease in sensation on  her right dorsum of her hand and the right dorsum of her foot.  Visual  fields are grossly intact at this time.  The patient's gait is smooth.  normal arm swing, normal stands.  The patient has no problem with  Romberg or balance.   LABORATORY STUDIES:  HbA1c is 5.1, CK is 440, CK-MB is 2.8, troponin  0.27, TSH is high at 8.145.  Sodium 141, potassium 3.1, chloride 104,  BUN 17, creatinine 1.2, glucose 102.  White blood cell count is 10.7,  platelets 170, hemoglobin and hematocrit 15.0 and 44.0.  Cholesterol  200, triglycerides 84, LDL is 182, HDL is 61.  EKG shows normal sinus  rhythm.  CT of head shows extensive microvascular white matter disease,  old superior cerebellum infarct and old left basal ganglia lacuna.  MRI  shows acute left lateral thalamus infarct.   ASSESSMENT:  This is a 60 year old African American female with acute  small vessel/hypertensive thalamic left cerebrovascular accident.  At  this time, recommendations are blood pressure control, aspirin daily  with we control correct hypothyroidism.  We will obtain an MRA of the  brain and transcranial Doppler.  At this time, the carotid Dopplers and  2-D echo are pending.   Thank you very much for this consultation.     ______________________________  Felicie Morn, PA-C      Marolyn Hammock. Thad Ranger, M.D.  Electronically Signed    DS/MEDQ  D:  09/26/2008  T:  09/27/2008  Job:  [id number]   cc:   [facility] team E

## 2010-07-31 NOTE — Discharge Summary (Signed)
[name], [name]       ACCOUNT NO.:  [id number]   MEDICAL RECORD NO.:  [id number]          PATIENT TYPE:  INP   LOCATION:  3701                         FACILITY:  [facility]   PHYSICIAN:  [name], MD    DATE OF BIRTH:  06-18-1950   DATE OF ADMISSION:  09/24/2008  DATE OF DISCHARGE:  09/27/2008                               DISCHARGE SUMMARY   ADDENDUM:  This addendum is to address the issue of elevated troponin  versus possible acute myocardial infarction.  As can be seen by the  final discharge diagnosis, we did not really think this patient had  acute myocardial infarction.  We did believe that her elevated troponin  was from accelerated hypertension.  Because of this, beta-blocker was  not prescribed at the time of discharge.  The patient was to go for  stress Cardiolite study and a possible left heart catheterization with  [facility] in the outpatient setting.  This was desired.  Again,  because we did not a confirmed diagnosis of acute myocardial infarction  and thought the elevated troponin was due to hypertensive urgency, beta-  blocker was not mandatorily prescribed at discharge.  Further evaluation  would be at [facility] post stress test and a possible left  heart catheterization.      [name], MD  Electronically Signed     GU/MEDQ  D:  10/09/2008  T:  10/09/2008  Job:  [id number]

## 2010-07-31 NOTE — H&P (Signed)
[name], VARI                   ACCOUNT NO.:  [id number]   MEDICAL RECORD NO.:  [id number]                   PATIENT TYPE:  INP   LOCATION:  4734                                 FACILITY:  [facility]   PHYSICIAN:  Santiago Bumpers. Hensel, M.D.             DATE OF BIRTH:  1950-07-15   DATE OF ADMISSION:  04/13/2003  DATE OF DISCHARGE:  04/14/2003                                HISTORY & PHYSICAL   CHIEF COMPLAINT:  Chest pressure x2-3 days.   HISTORY OF PRESENT ILLNESS:  A 60 year old black female with a history of  hypertension reports 2-3 days of intermittent chest pressure, left-sided,  occurring at rest, lasting typically a few minutes but at max an hour and a  half.  Feels like the pressure is deep inside, suspended, between her  chest and back.  Sometimes this same feeling extends into the left side of  her neck or deeper into the left side of her back or into the right anterior  chest.  No radiation down the arms.  No shortness of breath.  No nausea,  vomiting, or diaphoresis associated with the pain.  The pain does get better  with resting.  It is not aggravated by anything the patient can remember.  No history of similar pain in the past.  Patient blames her complaints on  not getting enough rest recently.  She is currently without chest pain in  the emergency room.   PAST MEDICAL HISTORY:  1. Hypertension:  She was prescribed hydrochlorothiazide in the past but     stopped taking it years ago secondary to difficulties with insurance and     finances.  2. Spontaneous vaginal delivery x2 many years ago.   PAST SURGICAL HISTORY:  Breast biopsy.   MEDICATIONS:  No over-the-counter or prescription medications but the  patient does take multiple herbals, including Aquazon, [place], elimination  tonic, [place], Tera Partridge (cat's claw), FiberCon, all of which are  based on [employer], which she has been on for at least three  years.   ALLERGIES:   PENICILLIN causes swelling of her face, rash, pruritus, but no  respiratory difficulty.   SOCIAL HISTORY:  Denies tobacco use, alcohol use, or illicit drug use.  She  is self-employed as an Industrial/product designer.  Single.  Lives alone.  No regular caffeine use.  She moved to [place] from [place],  [place] and has family in the area.   FAMILY HISTORY:  Father is living with hypertension and kidney stones.  Mother is living with CHF, hypertension, diabetes, and osteoarthritis.  Siblings have asthma, diabetes, and hypertension.  Her two children are  healthy.  There is no family history of MI, CAD, or sudden death.   REVIEW OF SYSTEMS:  Patient reports burping often over the past several  days, which is something new for her.  Also endorses nasal congestion and a  weight gain of maybe 10 pounds in the last  week, which she blames on foods  and hormone changes.  No headache, visual changes, blurry vision, or spot in  her visual field.  She reports occasionally catching herself holding my  breath.  She does report occasional right shoulder discomfort/pain/numbness  radiating down her right arm but reports a history of right rotator cuff  problems.  She occasionally gets numbness/pins and needles sensation in her  left hand and forearm when that extremity is elevated for several minutes.   PHYSICAL EXAMINATION:  VITAL SIGNS:  Temperature initially 100.2, down to  98.7, blood pressure initially 218/102 and subsequently 156/102, 192/113,  and following clonidine 0.2 mg p.o. x1, her blood pressure was 166/94.  Heart rate ranged from 80 to 94.  Respiratory rate 10-20.  O2 sat 97-98% on  room air.  GENERAL:  A comfortable, overweight black middle-aged female.  No acute  distress.  Alert and oriented x 4.  HEENT:  PERRL.  EOMI.  [place]/AT.  MMM.  OP without erythema or exudate.  NECK:  No lymphadenopathy.  No thyromegaly.  Supple.  LUNGS:  No tenderness on palpation of the chest  wall.  Lungs are clear to  auscultation bilaterally with good air flow and effort.  No wheezes, rales  or rhonchi.  No increased work of breathing.  HEART:  Regular rate and rhythm.  No murmurs, rubs or gallops.  There are 2+  radial and DP pulses bilaterally.  Capillary refill less than 2 seconds.  ABDOMEN:  Soft, nontender, nondistended.  Normal bowel sounds.  No masses.  EXTREMITIES:  No edema.  No calf tenderness to palpation.  No cyanosis.  Moves all extremities symmetrically.  NEUROLOGIC:  Cranial nerves II-XII are intact.  No focal deficits.   LABS:  White count 8.6, hemoglobin 12, hematocrit 36.9, platelets 277, MCV  low normal at 79.8, RDW elevated at 15.6.  I-STAT showed serum sodium 139,  potassium 3.7, chloride 106, bicarb 28, BUN 15, creatinine high normal at  1.5, glucose 91.  The pH 7.308, slightly low, and pCO2 slightly elevated at  55.4.  Point-of-care enzymes were negative x3 sets with myoglobin of 45.5,  40.3, and 43.8.  CK-MB of 1.6, 2.2, and 2.6, and troponin I of less than  0.05 x3 sets.   Chest x-ray, PA and lateral, showed no acute disease but a tortuous aorta.  CT of the chest and abdomen with contrast showed no evidence of dissection  of PE and no acute findings.  A small periumbilical hernia was noted.   EKG:  Normal sinus rhythm at 77 beats per minute.  Normal axis.  No acute  ST/T wave changes.   ASSESSMENT/PLAN:  A 60 year old black female with a history of hypertension  presents with left-sided chest pressure, intermittent over 2-3 days.  Occasional radiation and elevated blood pressures.  Assessment #1:  Chest pressure:  Atypical cardiac pain.  No evidence of  dissection or pulmonary embolus on CT and negative point-of-care cardiac  enzymes and EKG; however, with her elevated blood pressure, the EDP felt she  needed admission for observation, blood pressure control, rule out myocardial infarction.  Her differential includes angina, gastroesophageal   reflux disease, (associated burping?), musculoskeletal, (although less  likely, as not reproducible pain with palpation).  Consider starting the  patient on a PPI or H2 blocker.  Risk stratification as an outpatient with  exercise treadmill test, likely to be adequate, considering her limited  cardiac risk factors.  Consider checking fasting lipids as well, if it has  not been done recently.  Assessment #2:  Hypertension:  Patient was given 0.2 mg of clonidine orally  in the ER with slight improvement.  We will start her on hydrochlorothiazide  25 mg daily to see how her blood pressure does and will not continue the  clonidine.  Discussed the risks of uncontrolled hypertension with the  patient, and she is okay with restarting prescription blood pressure meds if  she needs them.  Assessment #3:  Herbal medication ingestion:  Patient has been on many  herbal treatments for years with unknown side effects.  Could they be  contributing to her hypertension and symptoms?  Assessment #4:  __________:  A 23-hour observation for now.  Will probably  DC home tomorrow if labs are okay and symptoms resolved.  She does need a  primary doctor for hypertension followup and risk stratification and  possibly an outpatient cardiology followup.     Georgina Peer, M.D.                 William A. Leveda Anna, M.D.   JM/MEDQ  D:  04/14/2003  T:  04/14/2003  Job:  [id number]

## 2010-07-31 NOTE — Discharge Summary (Signed)
[name], [name]                   ACCOUNT NO.:  [id number]   MEDICAL RECORD NO.:  [id number]                   PATIENT TYPE:  INP   LOCATION:  4734                                 FACILITY:  [facility]   PHYSICIAN:  Santiago Bumpers. Hensel, M.D.             DATE OF BIRTH:  06/09/1950   DATE OF ADMISSION:  04/13/2003  DATE OF DISCHARGE:  04/14/2003                                 DISCHARGE SUMMARY   DISCHARGE DIAGNOSES:  1. Chest pain, atypical.  2. Hypertension.   DISCHARGE INSTRUCTIONS:   MEDICATIONS:  1. Hydrochlorothiazide 25 mg p.o. daily.  2. Aspirin 81 mg p.o. daily.  3. Prilosec OTC 20 mg 1 p.o. daily for reflux if it continues to be an     issue.   DIET:  To decrease fat and salt intake.   FOLLOWUP:  1. [name] with Dr. Georgina Gordon; [name] is to call     [phone] for an appointment in 2 weeks.  For followup, [name] will need a     cardiac stress treadmill test as an outpatient and labs to rule out     pheochromocytoma at the patient's request.   BRIEF HISTORY AND HOSPITAL COURSE:  PROBLEM #1 - CHEST PAIN, ATYPICAL:  Mr.  [name] is a 60 year old black female with history of hypertension,  currently untreated, who presented with 2-3 days of intermittent chest  pressure and left-sided chest pain, found to be atypical chest pain and  ruled out for PE on admission CT and was ruled out for an MI with serial  cardiac enzymes and EKGs.  [name] was admitted secondary to her history of  uncontrolled hypertension.  Please see #2.  Differential did include angina  versus GERD, of which [name] does have a positive history and symptoms.  History did not suggest musculoskeletal.  It was felt that risk  stratification was best done as an outpatient with an exercise treadmill  test, her risk factors being age greater than 50 and hypertension.   PROBLEM #2 - HYPERTENSION:  In this long-term hypertensive, admission blood  pressure was 218/102 and decreased  with clonidine in the ED.  [name] was placed  on HCTZ, which [name] had been on previous, and an aspirin.  Low-salt/low-fat  diet was advised.  [name] was with a blood pressure of 116/77 upon discharge  and was to follow up closely.  [name] was educated quite a bit on the  importance of control of her hypertension and [name] felt that [name] understood,  did have questions with her husband with a pheochromocytoma, which they felt  was an exposure cause rather than hereditary, and [name] would like to be ruled  out for this as an outpatient.   PROBLEM #3 - DISPOSITION:  [name] was discharged to home on hospital day #2  with followup as above.  [name] was chest-pain-free and asymptomatic.  Of note,  [name] was an unassigned patient to the [facility],  is very interested in finding a local PCP, for which [name] will make an  appointment with Dr. Dahlia Gordon.  [name] has a long list of herbal medications  that [name] continues to take, will need to be researched and educate PCP as  well as patient about these medications.      Krystal Gins, MD                        Santiago Bumpers. Leveda Anna, M.D.    JS/MEDQ  D:  05/23/2003  T:  05/24/2003  Job:  [id number]   cc:   [name]

## 2011-01-24 ENCOUNTER — Emergency Department (HOSPITAL_COMMUNITY)
Admission: EM | Admit: 2011-01-24 | Discharge: 2011-01-24 | Disposition: A | Discharge status: Home / Self Care | Attending: Emergency Medicine | Admitting: Emergency Medicine

## 2011-01-24 ENCOUNTER — Emergency Department (HOSPITAL_COMMUNITY)

## 2011-01-24 DIAGNOSIS — R040 Epistaxis: Secondary | ICD-10-CM | POA: Insufficient documentation

## 2011-01-24 DIAGNOSIS — I10 Essential (primary) hypertension: Secondary | ICD-10-CM

## 2011-01-24 HISTORY — DX: Essential (primary) hypertension: I10

## 2011-01-24 MED ORDER — PHENYLEPHRINE HCL 1 % NA SOLN
NASAL | Status: AC
  Administered 2011-01-24: 20:00:00 via NASAL
  Filled 2011-01-24: qty 15

## 2011-01-24 MED ORDER — NEBIVOLOL HCL 5 MG PO TABS: 5.0000 mg | ORAL_TABLET | Freq: Every day | ORAL | Status: DC

## 2011-01-24 MED ORDER — PHENYLEPHRINE HCL 0.5 % NA SOLN
2.0000 [drp] | Freq: Four times a day (QID) | NASAL | Status: DC | PRN
  Filled 2011-01-24: qty 15

## 2011-01-24 MED ORDER — PHENYLEPHRINE HCL 0.25 % NA SOLN
2.0000 | Freq: Four times a day (QID) | NASAL | Status: DC | PRN
Start: 2011-01-24 — End: 2011-01-24
  Administered 2011-01-24: 2 via NASAL
  Filled 2011-01-24: qty 15

## 2011-01-24 NOTE — ED Notes (Signed)
Made RN aware of the blood pressure

## 2011-01-24 NOTE — ED Notes (Signed)
Pt reports intermittent rt nare epistaxis that began approx 1500 today - pt w/ a hx of htn and states has had difficulty getting her medication and so has not been able to take her medications for a few weeks. No bleeding noted at present. Pt resting comfortably on bed in no acute distress, family at bedside.

## 2011-01-24 NOTE — ED Notes (Signed)
Rx given x1 D/c instructions reviewed w/ pt and family - pt and family deny any further questions or concerns at present.  

## 2011-01-25 ENCOUNTER — Encounter (HOSPITAL_COMMUNITY): Payer: Self-pay | Admitting: *Deleted

## 2011-01-25 ENCOUNTER — Emergency Department (HOSPITAL_COMMUNITY)
Admission: EM | Admit: 2011-01-25 | Discharge: 2011-01-26 | Disposition: A | Discharge status: Home / Self Care | Attending: Emergency Medicine | Admitting: Emergency Medicine

## 2011-01-25 DIAGNOSIS — R040 Epistaxis: Secondary | ICD-10-CM | POA: Insufficient documentation

## 2011-01-25 DIAGNOSIS — I10 Essential (primary) hypertension: Secondary | ICD-10-CM | POA: Insufficient documentation

## 2011-01-25 NOTE — ED Notes (Signed)
Pt in c/o intermittent nose bleeds since yesterday, seen for same last night, pt sent home with new medication for BP but has not been able to fill it yet, pt states she feels tired at this time

## 2011-01-25 NOTE — ED Provider Notes (Signed)
History     CSN: [id number] Arrival date & time: 01/24/2011  5:23 PM   First MD Initiated Contact with Patient 01/24/11 1855      Chief Complaint  Patient presents with  . Hypertension    pt in from home with c/o nose bleed and hypertension states onset around 1500 denies pain or discomfort at the time nose bleed is controlled     (Consider location/radiation/quality/duration/timing/severity/associated sxs/prior treatment) Patient is a 60 y.o. female presenting with hypertension and nosebleeds.  Hypertension This is a chronic problem. The problem occurs constantly. The problem has not changed since onset.Pertinent negatives include no chest pain, no headaches and no shortness of breath. Associated symptoms comments: States sometimes her hand tingles but denies weakness. The symptoms are aggravated by nothing. The symptoms are relieved by nothing. She has tried nothing for the symptoms.  Epistaxis  This is a new problem. The current episode started less than 1 hour ago. The problem occurs constantly. The problem has been resolved. The problem is associated with an unknown factor. The bleeding has been from the right nare. She has tried applying pressure for the symptoms. The treatment provided significant relief. Her past medical history is significant for allergies. Her past medical history does not include frequent nosebleeds.    Past Medical History  Diagnosis Date  . Hypertension     History reviewed. No pertinent past surgical history.  No family history on file.  History  Substance Use Topics  . Smoking status: Never Smoker   . Smokeless tobacco: Not on file  . Alcohol Use: No    OB History    Grav Para Term Preterm Abortions TAB SAB Ect Mult Living                  Review of Systems  HENT: Positive for nosebleeds.   Respiratory: Negative for shortness of breath.   Cardiovascular: Negative for chest pain.  Neurological: Negative for headaches.  All other systems  reviewed and are negative.    Allergies  Penicillins and Sulfonamide derivatives  Home Medications   Current Outpatient Rx  Name Route Sig Dispense Refill  . NEBIVOLOL HCL 5 MG PO TABS Oral Take 5 mg by mouth daily.      . NEBIVOLOL HCL 5 MG PO TABS Oral Take 1 tablet (5 mg total) by mouth daily. 30 tablet 1    BP 183/111  Pulse 82  Temp(Src) 98.8 F (37.1 C) (Oral)  Resp 18  SpO2 98%  Physical Exam  Nursing note and vitals reviewed. Constitutional: She is oriented to person, place, and time. She appears well-developed and well-nourished. No distress.  HENT:  Head: Normocephalic and atraumatic.       Right nare with fresh blood but hemostatic  Eyes: EOM are normal. Pupils are equal, round, and reactive to light.  Cardiovascular: Normal rate, regular rhythm, normal heart sounds and intact distal pulses.  Exam reveals no friction rub.   No murmur heard. Pulmonary/Chest: Effort normal and breath sounds normal. She has no wheezes. She has no rales.  Abdominal: Soft. Bowel sounds are normal. She exhibits no distension. There is no tenderness. There is no rebound and no guarding.  Musculoskeletal: Normal range of motion. She exhibits no tenderness.       No edema  Neurological: She is alert and oriented to person, place, and time. No cranial nerve deficit.  Skin: Skin is warm and dry. No rash noted.  Psychiatric: She has a normal mood and affect.  Her behavior is normal.    ED Course  Procedures (including critical care time)  Labs Reviewed - No data to display Ct Head Wo Contrast  01/24/2011  *RADIOLOGY REPORT*  Clinical Data: Uncontrolled hypertension.  Nose bleed.  CT HEAD WITHOUT CONTRAST  Technique:  Contiguous axial images were obtained from the base of the skull through the vertex without contrast.  Comparison: 09/24/2008  Findings: There is no acute intracranial hemorrhage, infarction, or mass lesion.  Scattered periventricular white matter lucencies consistent with  chronic small vessel ischemic disease.  No visible evidence of the previous left thalamic infarct.  No osseous abnormality.  IMPRESSION: No acute intracranial abnormality.  Original Report Authenticated By: Gwynn Burly, M.D.     1. Epistaxis   2. Hypertension       MDM   Pt with anterior nosebleed most likely from recent allergies.  Hemostatic now and will treat with neosynephrine.  Secondly HTN and stopped BP meds months ago.  Repeat BP with continued HTN and will ppx her home dose of bystolic.  Pt denies HA, CP, or SOB and head CT neg.       Gwyneth Sprout, MD 01/25/11 [phone]

## 2011-01-26 LAB — CBC
HCT: 37.5 % (ref 36.0–46.0)
Hemoglobin: 12.5 g/dL (ref 12.0–15.0)
RBC: 4.08 MIL/uL (ref 3.87–5.11)
RDW: 13.2 % (ref 11.5–15.5)
WBC: 12.8 10*3/uL — ABNORMAL HIGH (ref 4.0–10.5)

## 2011-01-26 MED ORDER — AZITHROMYCIN 250 MG PO TABS: 250.0000 mg | ORAL_TABLET | Freq: Every day | ORAL | Status: DC

## 2011-01-26 MED ORDER — SODIUM CHLORIDE 0.9 % IV SOLN
Freq: Once | INTRAVENOUS | Status: AC
  Administered 2011-01-26: 03:00:00 via INTRAVENOUS

## 2011-01-26 MED ORDER — OXYMETAZOLINE HCL 0.05 % NA SOLN
1.0000 | Freq: Once | NASAL | Status: AC
  Administered 2011-01-26: 1 via NASAL

## 2011-01-26 MED ORDER — OXYMETAZOLINE HCL 0.05 % NA SOLN
NASAL | Status: AC
  Administered 2011-01-26: 04:00:00
  Filled 2011-01-26: qty 15

## 2011-01-26 MED ORDER — COCAINE HCL 4 % EX SOLN: 4.0000 mL | Freq: Once | CUTANEOUS | Status: DC

## 2011-01-26 MED ORDER — HYDROCODONE-ACETAMINOPHEN 5-500 MG PO TABS: 1.0000 | ORAL_TABLET | Freq: Four times a day (QID) | ORAL | Status: DC | PRN

## 2011-01-26 MED ORDER — COCAINE HCL 4 % EX SOLN
CUTANEOUS | Status: AC
  Administered 2011-01-26: 01:00:00
  Filled 2011-01-26: qty 4

## 2011-01-26 NOTE — ED Provider Notes (Signed)
Medical screening examination/treatment/procedure(s) were performed by non-physician practitioner and as supervising physician I was immediately available for consultation/collaboration. Devoria Albe, MD, Armando Gang   Ward Givens, MD 01/26/11 [phone]

## 2011-01-26 NOTE — ED Notes (Signed)
Pt to ED with nosebleed that started approx 40 min ago. Pt states was seen here yesterday for same. Pt denies any dizziness/fatigue. Holding pressure to nose at this time. Dondra Spry, FNP to bedside

## 2011-01-26 NOTE — ED Notes (Signed)
Report to [place], [place]

## 2011-01-26 NOTE — ED Notes (Signed)
Pt ambulated without assistance

## 2011-01-26 NOTE — ED Notes (Signed)
Pt resting on stretcher.  No nosebleed noted at this time. Pt denies any needs or concerns at this time. Will cont to monitor

## 2011-01-26 NOTE — ED Notes (Signed)
Patient stable upon discharge.  Discharged home with son and husband.  Patient nose bleed under control and states understanding of discharge papers.

## 2011-01-26 NOTE — ED Provider Notes (Signed)
History     CSN: [id number] Arrival date & time: 01/25/2011 11:16 PM   First MD Initiated Contact with Patient 01/26/11 0052      Chief Complaint  Patient presents with  . Epistaxis    (Consider location/radiation/quality/duration/timing/severity/associated sxs/prior treatment) Patient is a 60 y.o. female presenting with nosebleeds. The history is provided by the patient and a relative.  Epistaxis  This is a recurrent problem. The current episode started 6 to 12 hours ago. The problem has been gradually worsening. The bleeding has been from the right nare. She has tried applying pressure and vasoconstrictors for the symptoms. The treatment provided no relief.    Past Medical History  Diagnosis Date  . Hypertension     History reviewed. No pertinent past surgical history.  History reviewed. No pertinent family history.  History  Substance Use Topics  . Smoking status: Never Smoker   . Smokeless tobacco: Not on file  . Alcohol Use: No    OB History    Grav Para Term Preterm Abortions TAB SAB Ect Mult Living                  Review of Systems  Constitutional: Negative.   HENT: Positive for nosebleeds. Negative for congestion and rhinorrhea.   Eyes: Negative.   Respiratory: Negative.   Cardiovascular: Negative.   Gastrointestinal: Negative.   Musculoskeletal: Negative.   Neurological: Negative for dizziness, weakness, numbness and headaches.  Hematological: Does not bruise/bleed easily.  Psychiatric/Behavioral: Negative.     Allergies  Penicillins and Sulfonamide derivatives  Home Medications   Current Outpatient Rx  Name Route Sig Dispense Refill  . AZITHROMYCIN 250 MG PO TABS Oral Take 1 tablet (250 mg total) by mouth daily. 6 tablet 0    Take 2 tabs on day one than 1 tab daily therafter  . HYDROCODONE-ACETAMINOPHEN 5-500 MG PO TABS Oral Take 1-2 tablets by mouth every 6 (six) hours as needed for pain. 15 tablet 0  . NEBIVOLOL HCL 5 MG PO TABS Oral Take  5 mg by mouth daily. Has not had this medication filled yet      BP 134/82  Pulse 77  Temp(Src) 97.6 F (36.4 C) (Oral)  Resp 14  SpO2 100%  Physical Exam  Constitutional: She is oriented to person, place, and time. She appears well-developed.  HENT:  Head: Normocephalic.  Nose: Mucosal edema present. No sinus tenderness, nasal deformity or septal deviation. Epistaxis is observed. Right sinus exhibits no maxillary sinus tenderness and no frontal sinus tenderness.  Eyes: EOM are normal.  Neck: Neck supple.  Cardiovascular: Regular rhythm.   Pulmonary/Chest: Breath sounds normal.  Musculoskeletal: Normal range of motion.  Neurological: She is oriented to person, place, and time.  Skin: Skin is warm and dry.    ED Course  EPISTAXIS MANAGEMENT Date/Time: 01/26/2011 1:22 AM Performed by: Arman Filter Authorized by: Arman Filter Consent: Verbal consent obtained. The procedure was performed in an emergent situation. Consent given by: patient Site marked: the operative site was not marked Patient identity confirmed: verbally with patient Treatment site: right anterior Comments: Cocaine applied to anterior nostril after pressure held for 15 minutes   EPISTAXIS MANAGEMENT Date/Time: 01/26/2011 3:35 AM Performed by: Arman Filter Authorized by: Arman Filter Consent: Verbal consent obtained. Consent given by: patient Patient understanding: patient states understanding of the procedure being performed Site marked: the operative site was not marked Patient identity confirmed: verbally with patient Patient sedated: no Treatment site: right anterior  Repair method: nasal balloon Post-procedure assessment: bleeding decreased Treatment complexity: complex Recurrence: recurrence of recent bleed Patient tolerance: Patient tolerated the procedure well with no immediate complications.   (including critical care time)  Labs Reviewed  CBC - Abnormal; Notable for the following:      WBC 12.8 (*)    All other components within normal limits   Ct Head Wo Contrast  01/24/2011  *RADIOLOGY REPORT*  Clinical Data: Uncontrolled hypertension.  Nose bleed.  CT HEAD WITHOUT CONTRAST  Technique:  Contiguous axial images were obtained from the base of the skull through the vertex without contrast.  Comparison: 09/24/2008  Findings: There is no acute intracranial hemorrhage, infarction, or mass lesion.  Scattered periventricular white matter lucencies consistent with chronic small vessel ischemic disease.  No visible evidence of the previous left thalamic infarct.  No osseous abnormality.  IMPRESSION: No acute intracranial abnormality.  Original Report Authenticated By: Gwynn Burly, M.D.   4:53 AM Ambulated in hall without difficulty Will DC home with antibiotic, pain medications and followup with ENT tomorrow 1. Epistaxis       MDM  Anterior nasal bleed        Arman Filter, NP 01/26/11 0336  Arman Filter, NP 01/26/11 [phone]

## 2011-01-26 NOTE — ED Notes (Signed)
Pt ambulated to sink to wash her hands. Pt c/o dizziness and feeling fatigue. Pt got very diaphoretic and passed out while laying on stretcher. Pt easily aroused but is very lethargic. Pt placed on cardiac monitor. PIV placed. IVF infusing. B/P 142/92, HR 77. Gail, FNP to bedside

## 2011-02-01 ENCOUNTER — Encounter (HOSPITAL_COMMUNITY): Payer: Self-pay | Admitting: *Deleted

## 2011-02-01 ENCOUNTER — Emergency Department (HOSPITAL_COMMUNITY)
Admission: EM | Admit: 2011-02-01 | Discharge: 2011-02-01 | Disposition: A | Discharge status: Home / Self Care | Attending: Emergency Medicine | Admitting: Emergency Medicine

## 2011-02-01 DIAGNOSIS — D649 Anemia, unspecified: Secondary | ICD-10-CM

## 2011-02-01 DIAGNOSIS — R5381 Other malaise: Secondary | ICD-10-CM | POA: Insufficient documentation

## 2011-02-01 DIAGNOSIS — I10 Essential (primary) hypertension: Secondary | ICD-10-CM | POA: Insufficient documentation

## 2011-02-01 DIAGNOSIS — Z79899 Other long term (current) drug therapy: Secondary | ICD-10-CM | POA: Insufficient documentation

## 2011-02-01 DIAGNOSIS — R040 Epistaxis: Secondary | ICD-10-CM | POA: Insufficient documentation

## 2011-02-01 DIAGNOSIS — R0602 Shortness of breath: Secondary | ICD-10-CM | POA: Insufficient documentation

## 2011-02-01 LAB — CBC
Hemoglobin: 9.1 g/dL — ABNORMAL LOW (ref 12.0–15.0)
MCH: 30.4 pg (ref 26.0–34.0)
RBC: 2.99 MIL/uL — ABNORMAL LOW (ref 3.87–5.11)

## 2011-02-01 LAB — PROTIME-INR: Prothrombin Time: 13.8 seconds (ref 11.6–15.2)

## 2011-02-01 MED ORDER — CEPHALEXIN 500 MG PO CAPS: 500.0000 mg | ORAL_CAPSULE | Freq: Three times a day (TID) | ORAL | Status: DC

## 2011-02-01 MED ORDER — FERROUS SULFATE 325 (65 FE) MG PO TABS: 325.0000 mg | ORAL_TABLET | Freq: Two times a day (BID) | ORAL | Status: DC

## 2011-02-01 NOTE — ED Provider Notes (Signed)
History     CSN: [id number] Arrival date & time: 02/01/2011  2:59 PM   First MD Initiated Contact with Patient 02/01/11 1527      Chief Complaint  Patient presents with  . Epistaxis  . Weakness    (Consider location/radiation/quality/duration/timing/severity/associated sxs/prior treatment) Patient is a 60 y.o. female presenting with nosebleeds and weakness. The history is provided by the patient.  Epistaxis  This is a recurrent problem. The current episode started more than 1 week ago (started Nov 12). The problem occurs constantly. The problem has been gradually improving. The problem is associated with an unknown factor. The bleeding has been from the right nare. She has tried vasoconstrictors, a nasal tampon and applying pressure for the symptoms. The treatment provided moderate relief. Past medical history comments: HTN.  Weakness Primary symptoms do not include headaches, fever or nausea.  Additional symptoms include weakness.    Past Medical History  Diagnosis Date  . Hypertension     History reviewed. No pertinent past surgical history.  No family history on file.  History  Substance Use Topics  . Smoking status: Never Smoker   . Smokeless tobacco: Not on file  . Alcohol Use: No    OB History    Grav Para Term Preterm Abortions TAB SAB Ect Mult Living                  Review of Systems  Constitutional: Negative for fever.  HENT: Positive for nosebleeds.   Respiratory: Positive for shortness of breath. Negative for cough.   Cardiovascular: Negative for chest pain.  Gastrointestinal: Negative for nausea, abdominal pain and diarrhea.  Neurological: Positive for weakness. Negative for headaches.  All other systems reviewed and are negative.    Allergies  Penicillins and Sulfa antibiotics  Home Medications   Current Outpatient Rx  Name Route Sig Dispense Refill  . VITAMIN C 1000 MG PO TABS Oral Take 1,000 mg by mouth daily.      . AZITHROMYCIN 250 MG  PO TABS Oral Take 250-500 mg by mouth daily. 2 tablets the first day then 1 tablet remainder days. Finished on Saturday (11/17)     . B COMPLEX-C PO TABS Oral Take 1 tablet by mouth daily.      . CO Q 10 PO Oral Take 1 tablet by mouth daily.      Carma Leaven M PLUS PO TABS Oral Take 1 tablet by mouth daily.      . NEBIVOLOL HCL 5 MG PO TABS Oral Take 5 mg by mouth daily.      Marland Kitchen VITAMIN D (CHOLECALCIFEROL) PO Oral Take 1 tablet by mouth daily.      . AZITHROMYCIN 250 MG PO TABS Oral Take 250-500 mg by mouth daily. 2 the first day then 1 tablet for remainder days.  Finished on Saturday (11/17)      BP 160/98  Pulse 83  Temp(Src) 98.2 F (36.8 C) (Oral)  SpO2 99%  Physical Exam  Nursing note and vitals reviewed. Constitutional: She is oriented to person, place, and time. She appears well-developed and well-nourished. No distress.  HENT:  Head: Normocephalic and atraumatic.  Nose: No nasal septal hematoma. Epistaxis (right nostril) is observed. Foreign body (nasal packing in place) is present.  Eyes: EOM are normal. Pupils are equal, round, and reactive to light.  Neck: Normal range of motion.  Cardiovascular: Normal rate and normal heart sounds.   Pulmonary/Chest: Effort normal and breath sounds normal. No respiratory distress.  Abdominal:  Soft. She exhibits no distension. There is no tenderness.  Musculoskeletal: Normal range of motion.  Neurological: She is alert and oriented to person, place, and time.  Skin: Skin is warm and dry.    ED Course  Procedures (including critical care time)  No results found.  Results for orders placed during the hospital encounter of 02/01/11  CBC      Component Value Range   WBC 9.4  4.0 - 10.5 (K/uL)   RBC 2.99 (*) 3.87 - 5.11 (MIL/uL)   Hemoglobin 9.1 (*) 12.0 - 15.0 (g/dL)   HCT 40.9 (*) 81.1 - 46.0 (%)   MCV 93.3  78.0 - 100.0 (fL)   MCH 30.4  26.0 - 34.0 (pg)   MCHC 32.6  30.0 - 36.0 (g/dL)   RDW 91.4  78.2 - 95.6 (%)   Platelets 257  150  - 400 (K/uL)  PROTIME-INR      Component Value Range   Prothrombin Time 13.8  11.6 - 15.2 (seconds)   INR 1.04  0.00 - 1.49     1. Anemia   2. Bleeding nose       MDM  3:39 PM Pt seen and examined. Pt with ongoing nose bleed since Nov 12. She had packing placed either Nov 12 or 13th. She has been on antibiotics since then. Patient states that she has been feeling weak and has noticed SOB when walking up a flight of stairs. Pt was noted to be hypertensive when she was at the ENT office earlier today. She was sent here for bloodwork and to deal with her elevated BP.    5:13 PM Dr. Merceda Elks was at bedside and cauterized three potential areas of bleeding. He then repacked the nose and will see patient in several days in office to remove. Pt with mild anemia on CBC. Will advise iron pills and close follow up with her doctor to recheck her Hgb level. Pt advised to return to ED if weakness or SOB worsens or the bleeding returns.  Daleen Bo 02/02/11 2130

## 2011-02-01 NOTE — ED Notes (Signed)
Dr Jearld Fenton office called and requested cbc to be performed.  ermd to do work up and Dr Jearld Fenton will follow up later

## 2011-02-01 NOTE — ED Notes (Signed)
Pt states feels better. No bleeding at present. No distress. Md states to wait for discharge and will let me know when she can go.  Pt aware waiting for reply.

## 2011-02-01 NOTE — ED Notes (Signed)
To ed for eval of HTN. Pt has nare packed due to nose bleed. Told to come to ed to have b/p evaluated prior to have packing removed.

## 2011-02-01 NOTE — H&P (Signed)
[name] is an 60 y.o. female.   Chief Complaint: Epistaxis HPI:   Subjective:  She is here for epistaxis that's been going on for one week. She is having continuous bleeding through her right-sided pack or 6 days. The pack was placed in the emergency room. She was in the office today with high blood pressure and lightheadedness. She was sent back to the emergency room to be evaluated and control for the hypertension.      *  Past Medical History  Diagnosis Date  . Hypertension     History reviewed. No pertinent past surgical history.  No family history on file. Social History:  reports that she has never smoked. She does not have any smokeless tobacco history on file. She reports that she does not drink alcohol. Her drug history not on file.  Allergies:  Allergies  Allergen Reactions  . Penicillins Swelling  . Sulfa Antibiotics Itching    No current facility-administered medications on file as of 02/01/2011.   No current outpatient prescriptions on file as of 02/01/2011.    Results for orders placed during the hospital encounter of 02/01/11 (from the past 48 hour(s))  CBC     Status: Abnormal   Collection Time   02/01/11  4:00 PM      Component Value Range Comment   WBC 9.4  4.0 - 10.5 (K/uL)    RBC 2.99 (*) 3.87 - 5.11 (MIL/uL)    Hemoglobin 9.1 (*) 12.0 - 15.0 (g/dL)    HCT 04.5 (*) 40.9 - 46.0 (%)    MCV 93.3  78.0 - 100.0 (fL)    MCH 30.4  26.0 - 34.0 (pg)    MCHC 32.6  30.0 - 36.0 (g/dL)    RDW 81.1  91.4 - 78.2 (%)    Platelets 257  150 - 400 (K/uL)   PROTIME-INR     Status: Normal   Collection Time   02/01/11  4:00 PM      Component Value Range Comment   Prothrombin Time 13.8  11.6 - 15.2 (seconds)    INR 1.04  0.00 - 1.49     No results found.  Review of Systems  Constitutional: Negative.   HENT: Negative.   Skin: Negative.     Blood pressure 159/88, pulse 79, temperature 97.9 F (36.6 C), temperature source Oral, SpO2  99.00%. Physical Exam  Constitutional: She appears well-developed.  HENT:  Head: Normocephalic and atraumatic.  Right Ear: External ear normal.  Left Ear: External ear normal.  Mouth/Throat: Oropharynx is clear and moist.       She had some areas of possible bleeding on the upper right anterior septum and right inferior turbinate. There was also a spot on the floor the nose. All of these areas were cauterized with silver nitrate after placing Afrin/lidocaine pledgets. A Merocel sponge soaked in bacitracin was placed into the nose and taped across the dorsum of the nose.     Assessment/Plan Epistaxis-she has a Miracil packing was placed on Keflex by the emergency room physician. She will followup in 4-5 days for pack removal sooner if any further bleeding.  [name] M 02/01/2011, 5:31 PM

## 2011-02-02 NOTE — ED Provider Notes (Signed)
I saw and evaluated the patient, reviewed the resident's note and I agree with the findings and plan.  Patient was referred to ED from ENT office since patient feeling weak and lightheaded. Seen by [name], patient had nasal packing. H/H checked and ENT to ED to remove packing and need to control nasal bleeding.  Shelda Jakes, MD 02/02/11 [phone]

## 2013-05-22 ENCOUNTER — Encounter (HOSPITAL_COMMUNITY): Payer: Self-pay | Admitting: *Deleted

## 2013-05-22 ENCOUNTER — Inpatient Hospital Stay (HOSPITAL_COMMUNITY)
Admission: AD | Admit: 2013-05-22 | Discharge: 2013-05-24 | DRG: 305 | Disposition: A | Discharge status: Home / Self Care | Source: Ambulatory Visit | Attending: Internal Medicine | Admitting: Internal Medicine

## 2013-05-22 DIAGNOSIS — Z8673 Personal history of transient ischemic attack (TIA), and cerebral infarction without residual deficits: Secondary | ICD-10-CM

## 2013-05-22 DIAGNOSIS — R1013 Epigastric pain: Secondary | ICD-10-CM | POA: Diagnosis present

## 2013-05-22 DIAGNOSIS — I214 Non-ST elevation (NSTEMI) myocardial infarction: Secondary | ICD-10-CM

## 2013-05-22 DIAGNOSIS — I160 Hypertensive urgency: Secondary | ICD-10-CM | POA: Diagnosis present

## 2013-05-22 DIAGNOSIS — I69998 Other sequelae following unspecified cerebrovascular disease: Secondary | ICD-10-CM

## 2013-05-22 DIAGNOSIS — E785 Hyperlipidemia, unspecified: Secondary | ICD-10-CM

## 2013-05-22 DIAGNOSIS — E669 Obesity, unspecified: Secondary | ICD-10-CM

## 2013-05-22 DIAGNOSIS — Z8249 Family history of ischemic heart disease and other diseases of the circulatory system: Secondary | ICD-10-CM

## 2013-05-22 DIAGNOSIS — I447 Left bundle-branch block, unspecified: Secondary | ICD-10-CM | POA: Diagnosis present

## 2013-05-22 DIAGNOSIS — J309 Allergic rhinitis, unspecified: Secondary | ICD-10-CM

## 2013-05-22 DIAGNOSIS — R109 Unspecified abdominal pain: Secondary | ICD-10-CM | POA: Diagnosis present

## 2013-05-22 DIAGNOSIS — I10 Essential (primary) hypertension: Principal | ICD-10-CM

## 2013-05-22 DIAGNOSIS — Z6832 Body mass index (BMI) 32.0-32.9, adult: Secondary | ICD-10-CM

## 2013-05-22 DIAGNOSIS — R209 Unspecified disturbances of skin sensation: Secondary | ICD-10-CM | POA: Diagnosis present

## 2013-05-22 DIAGNOSIS — Z598 Other problems related to housing and economic circumstances: Secondary | ICD-10-CM

## 2013-05-22 DIAGNOSIS — Z5987 Material hardship due to limited financial resources, not elsewhere classified: Secondary | ICD-10-CM

## 2013-05-22 LAB — BASIC METABOLIC PANEL
BUN: 13 mg/dL (ref 6–23)
CO2: 27 meq/L (ref 19–32)
Calcium: 9.1 mg/dL (ref 8.4–10.5)
Chloride: 103 mEq/L (ref 96–112)
Creatinine, Ser: 0.96 mg/dL (ref 0.50–1.10)
GFR calc Af Amer: 72 mL/min — ABNORMAL LOW (ref >90)
GFR, EST NON AFRICAN AMERICAN: 62 mL/min — AB (ref >90)
GLUCOSE: 94 mg/dL (ref 70–99)
POTASSIUM: 3.4 meq/L — AB (ref 3.7–5.3)
Sodium: 142 mEq/L (ref 137–147)

## 2013-05-22 LAB — URINALYSIS, ROUTINE W REFLEX MICROSCOPIC
BILIRUBIN URINE: NEGATIVE
Glucose, UA: NEGATIVE mg/dL
Ketones, ur: NEGATIVE mg/dL
Leukocytes, UA: NEGATIVE
NITRITE: NEGATIVE
PROTEIN: NEGATIVE mg/dL
UROBILINOGEN UA: 0.2 mg/dL (ref 0.0–1.0)
pH: 6 (ref 5.0–8.0)

## 2013-05-22 LAB — URINE MICROSCOPIC-ADD ON

## 2013-05-22 NOTE — MAU Note (Addendum)
PT SAYS SHE TOOK  HER BP AT HOME TODAY WITH HOME WRIST  MACHINE-  HIGH-   SO  SHE LAYED DOWN.  THEN SHE WENT OUTSIDE-  AND HER  RIGHT HAND FELT TIGHT-  BUT USUALLY DOES.  SAYS TIP OF TONGUE FELT  BURNING.     SHE TAKES BP MED-  FROM [facility]- DR HOCHRIN- LAST SEEN -2013-   SHE  STOPPED TAKING ON HER OWN AND STARTED TAKING-  NATTOKINASE- JAP HERB-  SHE TAKES EVERYDAY-  BUT HAS NOT HAD ANY X2 WEEKS.    Marland Kitchen. LAST Monday- SAW DR Charlann LangeSCHARTZ-  [address]-  DID LABS-   BP WAS UP-    NO MEDS OR RX.       AT 6PM-TONIGHT -  SHE WENT TO URGENT CARE- FOR BP-  THEY TOLD HER TO GO TO HOSPITAL.   SO SHE CAME HERE.    PT SAYS SHE HAS TOOTH PAIN- WHERE FILLING HAS COME OUT - AND SHE THINKS THAT  IS WHY HER BP IS UP.  SAYS SHE FEELS LIKE SHE HAS A LOT OF GAS.

## 2013-05-22 NOTE — MAU Provider Note (Signed)
History     CSN: [id number]  Arrival date and time: 05/22/13 4098   First Provider Initiated Contact with Patient 05/22/13 2019      Chief Complaint  Patient presents with  . Hypertension   HPI [name] is a 63 y.o. G2P0 who presents to MAU from Urgent Care for HTN. The patient states that she was managed by her PCP and on medications until 2013 when she weaned herself off of her medications and began taking a [place] herbal supplement. She states that she ran out 2 weeks ago. She took BP at home today and it was elevated which prompted her to seek care. She states that she is also having tooth pain because of a lost filling. She already has an appointment with her Dentist. She denies headache, changes in vision, blurred vision, floaters, chest pain, numbness or tingling.   OB History   Grav Para Term Preterm Abortions TAB SAB Ect Mult Living   2         2      Past Medical History  Diagnosis Date  . Hypertension     Past Surgical History  Procedure Laterality Date  . Wisdom tooth extraction    . Minor breast biopsy      History reviewed. No pertinent family history.  History  Substance Use Topics  . Smoking status: Never Smoker   . Smokeless tobacco: Not on file  . Alcohol Use: No    Allergies:  Allergies  Allergen Reactions  . Penicillins Swelling  . Sulfa Antibiotics Itching     (Not in a hospital admission)  Review of Systems  Eyes: Negative for blurred vision and double vision.  Cardiovascular: Negative for chest pain.  Neurological: Negative for dizziness, tingling, sensory change, focal weakness and headaches.   Physical Exam   Blood pressure 160/87, pulse 76, temperature 98.2 F (36.8 C), temperature source Oral, resp. rate 18, height 5\' 4"  (1.626 m), weight 190 lb 12.8 oz (86.546 kg), SpO2 97.00%.  Physical Exam  Constitutional: She is oriented to person, place, and time. She appears well-developed and well-nourished. No  distress.  HENT:  Head: Normocephalic and atraumatic.  Cardiovascular: Normal rate.   Respiratory: Effort normal.  Neurological: She is alert and oriented to person, place, and time.  Skin: Skin is warm and dry. No erythema.  Psychiatric: She has a normal mood and affect.   Results for orders placed during the hospital encounter of 05/22/13 (from the past 24 hour(s))  URINALYSIS, ROUTINE W REFLEX MICROSCOPIC     Status: Abnormal   Collection Time    05/22/13  7:55 PM      Result Value Ref Range   Color, Urine YELLOW  YELLOW   APPearance CLEAR  CLEAR   Specific Gravity, Urine <1.005 (*) 1.005 - 1.030   pH 6.0  5.0 - 8.0   Glucose, UA NEGATIVE  NEGATIVE mg/dL   Hgb urine dipstick TRACE (*) NEGATIVE   Bilirubin Urine NEGATIVE  NEGATIVE   Ketones, ur NEGATIVE  NEGATIVE mg/dL   Protein, ur NEGATIVE  NEGATIVE mg/dL   Urobilinogen, UA 0.2  0.0 - 1.0 mg/dL   Nitrite NEGATIVE  NEGATIVE   Leukocytes, UA NEGATIVE  NEGATIVE  URINE MICROSCOPIC-ADD ON     Status: Abnormal   Collection Time    05/22/13  7:55 PM      Result Value Ref Range   Squamous Epithelial / LPF RARE  RARE   WBC, UA 0-2  <3 WBC/hpf  RBC / HPF 0-2  <3 RBC/hpf   Bacteria, UA FEW (*) RARE  BASIC METABOLIC PANEL     Status: Abnormal   Collection Time    05/22/13  8:55 PM      Result Value Ref Range   Sodium 142  137 - 147 mEq/L   Potassium 3.4 (*) 3.7 - 5.3 mEq/L   Chloride 103  96 - 112 mEq/L   CO2 27  19 - 32 mEq/L   Glucose, Bld 94  70 - 99 mg/dL   BUN 13  6 - 23 mg/dL   Creatinine, Ser 1.610.96  0.50 - 1.10 mg/dL   Calcium 9.1  8.4 - 09.610.5 mg/dL   GFR calc non Af Amer 62 (*) >90 mL/min   GFR calc Af Amer 72 (*) >90 mL/min      Patient Vitals for the past 24 hrs:  BP Temp Temp src Pulse Resp SpO2 Height Weight  05/23/13 0015 160/87 mmHg - - 76 18 97 % - -  05/23/13 0001 145/87 mmHg - - 75 15 97 % - -  05/22/13 2345 173/85 mmHg - - 72 24 98 % - -  05/22/13 2315 152/106 mmHg - - 72 17 99 % - -  05/22/13 2245  157/90 mmHg - - 75 17 98 % - -  05/22/13 2147 190/110 mmHg 98.2 F (36.8 C) Oral 85 20 100 % - -  05/22/13 2143 - - - - - 98 % - -  05/22/13 2112 205/99 mmHg - - 83 - - - -  05/22/13 2022 205/99 mmHg - - 83 - - - -  05/22/13 2012 193/104 mmHg - - 86 - - - -  05/22/13 1947 209/126 mmHg 98.9 F (37.2 C) Oral 92 18 99 % 5\' 4"  (1.626 m) 190 lb 12.8 oz (86.546 kg)    MAU Course  Procedures None  MDM Discussed patient with Dr. Criss AlvineGoldston. Recommends checking BMP, if no acute kidney concerns. Start on HCTZ outpatient [facility].  Discussed with Dr. Erin FullingHarraway-Smith. [facility] due to severe range BPs BMP ordered prior to transfer  Assessment and Plan  A: HTN  P: Transfer to [facility] by [facility] for further evalution  Freddi StarrJulie N Ethier, PA-C  05/23/2013, 12:26 AM

## 2013-05-22 NOTE — ED Notes (Signed)
Bed: WA06 Expected date:  Expected time:  Means of arrival:  Comments: [facility]

## 2013-05-22 NOTE — ED Notes (Signed)
PER EMS - [facility], hx HTN, noncompliant, c/o 1/10 HA, a+ox4, neuros intact, no other complaints.

## 2013-05-22 NOTE — ED Notes (Signed)
Initial Contact - pt resting on stretcher with family at bs, a+ox4.  Pt reports c/o 1/10 L sided HA, also reports "burning" to tip of tongue "which is what happened when i had my stroke before".  Pt denies weakness, dizziness.  Tongue midline, no facial asymmetry, speech clear, speaking full sentences.  PERRL.  MAEI, +csm/+pulses. Skin PWD.  Pt denies CP/palpitations.  NAD.

## 2013-05-22 NOTE — MAU Note (Signed)
Went to urgent care b/c BP was high at home; at urgent care systolic was in the 220s. Urgent care told her to come to the hospital; [facility]. Denies chest pain/SOB/headache/palpitations/dizziness. Has history of HTN but currently not on meds. History of stroke d/t BP in 2010.

## 2013-05-23 ENCOUNTER — Inpatient Hospital Stay (HOSPITAL_COMMUNITY)

## 2013-05-23 ENCOUNTER — Encounter (HOSPITAL_COMMUNITY): Payer: Self-pay | Admitting: Internal Medicine

## 2013-05-23 DIAGNOSIS — I10 Essential (primary) hypertension: Principal | ICD-10-CM

## 2013-05-23 DIAGNOSIS — I160 Hypertensive urgency: Secondary | ICD-10-CM | POA: Diagnosis present

## 2013-05-23 DIAGNOSIS — Z8673 Personal history of transient ischemic attack (TIA), and cerebral infarction without residual deficits: Secondary | ICD-10-CM

## 2013-05-23 DIAGNOSIS — R109 Unspecified abdominal pain: Secondary | ICD-10-CM | POA: Diagnosis present

## 2013-05-23 DIAGNOSIS — I517 Cardiomegaly: Secondary | ICD-10-CM

## 2013-05-23 LAB — COMPREHENSIVE METABOLIC PANEL
ALBUMIN: 4 g/dL (ref 3.5–5.2)
ALBUMIN: 3.6 g/dL (ref 3.5–5.2)
ALK PHOS: 64 U/L (ref 39–117)
ALK PHOS: 57 U/L (ref 39–117)
ALT: 17 U/L (ref 0–35)
ALT: 15 U/L (ref 0–35)
AST: 24 U/L (ref 0–37)
AST: 23 U/L (ref 0–37)
BILIRUBIN TOTAL: 1.3 mg/dL — AB (ref 0.3–1.2)
BUN: 10 mg/dL (ref 6–23)
BUN: 14 mg/dL (ref 6–23)
CHLORIDE: 102 meq/L (ref 96–112)
CO2: 27 meq/L (ref 19–32)
CO2: 27 mEq/L (ref 19–32)
CREATININE: 0.88 mg/dL (ref 0.50–1.10)
Calcium: 9.1 mg/dL (ref 8.4–10.5)
Calcium: 9 mg/dL (ref 8.4–10.5)
Chloride: 101 mEq/L (ref 96–112)
Creatinine, Ser: 1.08 mg/dL (ref 0.50–1.10)
GFR calc Af Amer: 62 mL/min — ABNORMAL LOW (ref >90)
GFR calc non Af Amer: 54 mL/min — ABNORMAL LOW (ref >90)
GFR, EST AFRICAN AMERICAN: 80 mL/min — AB (ref >90)
GFR, EST NON AFRICAN AMERICAN: 69 mL/min — AB (ref >90)
GLUCOSE: 95 mg/dL (ref 70–99)
GLUCOSE: 105 mg/dL — AB (ref 70–99)
POTASSIUM: 3.5 meq/L — AB (ref 3.7–5.3)
POTASSIUM: 3.6 meq/L — AB (ref 3.7–5.3)
Sodium: 142 mEq/L (ref 137–147)
Sodium: 139 mEq/L (ref 137–147)
Total Bilirubin: 1.2 mg/dL (ref 0.3–1.2)
Total Protein: 7.7 g/dL (ref 6.0–8.3)
Total Protein: 6.9 g/dL (ref 6.0–8.3)

## 2013-05-23 LAB — CBC WITH DIFFERENTIAL/PLATELET
BASOS PCT: 0 % (ref 0–1)
Basophils Absolute: 0 10*3/uL (ref 0.0–0.1)
Eosinophils Absolute: 0.1 10*3/uL (ref 0.0–0.7)
Eosinophils Relative: 1 % (ref 0–5)
HEMATOCRIT: 39.2 % (ref 36.0–46.0)
HEMOGLOBIN: 13.7 g/dL (ref 12.0–15.0)
Lymphocytes Relative: 31 % (ref 12–46)
Lymphs Abs: 2.6 10*3/uL (ref 0.7–4.0)
MCH: 31.6 pg (ref 26.0–34.0)
MCHC: 34.9 g/dL (ref 30.0–36.0)
MCV: 90.5 fL (ref 78.0–100.0)
MONOS PCT: 10 % (ref 3–12)
Monocytes Absolute: 0.8 10*3/uL (ref 0.1–1.0)
NEUTROS ABS: 5 10*3/uL (ref 1.7–7.7)
Neutrophils Relative %: 59 % (ref 43–77)
Platelets: 172 10*3/uL (ref 150–400)
RBC: 4.33 MIL/uL (ref 3.87–5.11)
RDW: 13.4 % (ref 11.5–15.5)
WBC: 8.5 10*3/uL (ref 4.0–10.5)

## 2013-05-23 LAB — CBC
HCT: 41 % (ref 36.0–46.0)
Hemoglobin: 14 g/dL (ref 12.0–15.0)
MCH: 31 pg (ref 26.0–34.0)
MCHC: 34.1 g/dL (ref 30.0–36.0)
MCV: 90.9 fL (ref 78.0–100.0)
PLATELETS: 172 10*3/uL (ref 150–400)
RBC: 4.51 MIL/uL (ref 3.87–5.11)
RDW: 13.3 % (ref 11.5–15.5)
WBC: 7.9 10*3/uL (ref 4.0–10.5)

## 2013-05-23 LAB — MRSA PCR SCREENING: MRSA BY PCR: NEGATIVE

## 2013-05-23 LAB — GLUCOSE, CAPILLARY
GLUCOSE-CAPILLARY: 101 mg/dL — AB (ref 70–99)
GLUCOSE-CAPILLARY: 95 mg/dL (ref 70–99)
Glucose-Capillary: 89 mg/dL (ref 70–99)

## 2013-05-23 LAB — TROPONIN I: Troponin I: <0.3 ng/mL (ref <0.30)

## 2013-05-23 LAB — RAPID URINE DRUG SCREEN, HOSP PERFORMED
Amphetamines: NOT DETECTED
BENZODIAZEPINES: NOT DETECTED
Barbiturates: NOT DETECTED
Cocaine: NOT DETECTED
Opiates: NOT DETECTED
Tetrahydrocannabinol: NOT DETECTED

## 2013-05-23 LAB — HEMOGLOBIN A1C
HEMOGLOBIN A1C: 5.7 % — AB (ref <5.7)
Mean Plasma Glucose: 117 mg/dL — ABNORMAL HIGH (ref <117)

## 2013-05-23 LAB — TSH: TSH: 6.914 u[IU]/mL — AB (ref 0.350–4.500)

## 2013-05-23 MED ORDER — POTASSIUM CHLORIDE CRYS ER 20 MEQ PO TBCR
EXTENDED_RELEASE_TABLET | ORAL | Status: AC
  Filled 2013-05-23: qty 2

## 2013-05-23 MED ORDER — POTASSIUM CHLORIDE CRYS ER 20 MEQ PO TBCR: 40.0000 meq | EXTENDED_RELEASE_TABLET | Freq: Once | ORAL | Status: DC

## 2013-05-23 MED ORDER — POTASSIUM CHLORIDE CRYS ER 20 MEQ PO TBCR
40.0000 meq | EXTENDED_RELEASE_TABLET | Freq: Once | ORAL | Status: AC
  Administered 2013-05-23: 40 meq via ORAL

## 2013-05-23 MED ORDER — CARVEDILOL 6.25 MG PO TABS
6.2500 mg | ORAL_TABLET | Freq: Two times a day (BID) | ORAL | Status: DC
  Administered 2013-05-23 – 2013-05-24 (×3): 6.25 mg via ORAL
  Filled 2013-05-23 (×5): qty 1

## 2013-05-23 MED ORDER — ACETAMINOPHEN 325 MG PO TABS: 650.0000 mg | ORAL_TABLET | ORAL | Status: DC | PRN

## 2013-05-23 MED ORDER — CLONIDINE HCL 0.1 MG PO TABS
0.1000 mg | ORAL_TABLET | Freq: Four times a day (QID) | ORAL | Status: DC | PRN
  Administered 2013-05-23: 0.1 mg via ORAL
  Filled 2013-05-23: qty 1

## 2013-05-23 MED ORDER — AMLODIPINE BESYLATE 10 MG PO TABS
10.0000 mg | ORAL_TABLET | Freq: Every day | ORAL | Status: DC
  Administered 2013-05-23 – 2013-05-24 (×2): 10 mg via ORAL
  Filled 2013-05-23 (×2): qty 1

## 2013-05-23 MED ORDER — ASPIRIN 81 MG PO CHEW
81.0000 mg | CHEWABLE_TABLET | Freq: Every day | ORAL | Status: DC
  Administered 2013-05-23 – 2013-05-24 (×2): 81 mg via ORAL
  Filled 2013-05-23 (×2): qty 1

## 2013-05-23 MED ORDER — ENOXAPARIN SODIUM 40 MG/0.4ML ~~LOC~~ SOLN
40.0000 mg | Freq: Every day | SUBCUTANEOUS | Status: DC
  Administered 2013-05-23: 40 mg via SUBCUTANEOUS
  Filled 2013-05-23 (×2): qty 0.4

## 2013-05-23 MED ORDER — LABETALOL HCL 5 MG/ML IV SOLN
0.5000 mg/min | INTRAVENOUS | Status: DC
  Administered 2013-05-23: 1.5 mg/min via INTRAVENOUS
  Administered 2013-05-23: 0.5 mg/min via INTRAVENOUS
  Administered 2013-05-23: 20 mg via INTRAVENOUS
  Filled 2013-05-23 (×2): qty 100

## 2013-05-23 MED ORDER — IOHEXOL 300 MG/ML  SOLN
25.0000 mL | INTRAMUSCULAR | Status: AC
  Administered 2013-05-23 (×2): 25 mL via ORAL

## 2013-05-23 MED ORDER — LABETALOL HCL 5 MG/ML IV SOLN: 20.0000 mg | Freq: Once | INTRAVENOUS | Status: DC

## 2013-05-23 MED ORDER — IOHEXOL 300 MG/ML  SOLN
100.0000 mL | Freq: Once | INTRAMUSCULAR | Status: AC | PRN
  Administered 2013-05-23: 100 mL via INTRAVENOUS

## 2013-05-23 MED ORDER — HYDRALAZINE HCL 20 MG/ML IJ SOLN: 10.0000 mg | Freq: Four times a day (QID) | INTRAMUSCULAR | Status: DC | PRN

## 2013-05-23 NOTE — ED Notes (Signed)
Pharmacy aware of need to send IV infusion to ED

## 2013-05-23 NOTE — ED Notes (Signed)
MD and charge nurse aware that patient still awaiting provider Patient remains hypertensive but denies complaints at this time Call bell in reach

## 2013-05-23 NOTE — Progress Notes (Signed)
SLP called RN who reports pt passed stroke swallow screen and does not need SLP eval.  Please reorder if desire. Krystal Burnetamara Laporsche Hoeger, MS [facility] SLP [phone]

## 2013-05-23 NOTE — ED Notes (Signed)
Meal given to patient after OK by Dr. Dierdre Highmanpitz

## 2013-05-23 NOTE — Progress Notes (Signed)
Nutrition Brief Note  Patient identified on the Malnutrition Screening Tool (MST) Report  Wt Readings from Last 15 Encounters:  05/23/13 189 lb 6 oz (85.9 kg)  10/24/09 197 lb (89.359 kg)  06/19/09 198 lb (89.812 kg)  02/18/09 205 lb (92.987 kg)  12/04/08 200 lb (90.719 kg)  10/03/08 192 lb (87.091 kg)    Body mass index is 32.49 kg/(m^2). Patient meets criteria for class I obesity based on current BMI.   Current diet order is heart healthy, patient is consuming approximately 100% of meals at this time. Labs and medications reviewed. Potassium low, getting oral replacement.   Pt with history of previous CVA and hypertension presently on herbal medications presents to the ER with complaints of elevated blood pressure. Reports good appetite at home, 3 meals/day, and 5 pound intentional weight loss in the past month. Tries to eat healthy, drink water, and eating mostly baked/grilled meats and vegetables.   Provided pt with handout from [employer] entitled "Hypertension Nutrition Therapy". Discussed sources of sodium in the diet and encouraged lower sodium choices in addition to pt consuming adequate fruits, vegetables, low fat dairy, and whole grains. Teach back method used. Expect good compliance.   No nutrition interventions warranted at this time. If nutrition issues arise, please consult RD.   Levon HedgerHeather Baron MS, RD, LDN [phone] Pager [phone] After Hours Pager

## 2013-05-23 NOTE — ED Notes (Addendum)
Order for Stroke Swallow Screen noted  Swallow screen previously completed prior to giving PO K-DUR, see documentation  Dr. Toniann FailKakrakandy made aware

## 2013-05-23 NOTE — ED Notes (Signed)
Pt requesting something to drink 

## 2013-05-23 NOTE — ED Notes (Signed)
Labetalol infusion titrated per order, see MAR Patient continues to remain asymptomatic Side rails up, call bell in reach

## 2013-05-23 NOTE — Progress Notes (Signed)
Echocardiogram 2D Echocardiogram has been performed.  Kamerin Grumbine 05/23/2013, 12:24 PM 

## 2013-05-23 NOTE — ED Notes (Signed)
MD at bedside. 

## 2013-05-23 NOTE — ED Notes (Signed)
Pt complain of right hand edema and chills. Pt states her mouth and skin feels dry. Pt denies pain.

## 2013-05-23 NOTE — Progress Notes (Signed)
CARE MANAGEMENT NOTE 05/23/2013  Patient:  [name],[name]   Account Number:  [id number]  Date Initiated:  05/23/2013  Documentation initiated by:  Helaina Stefano  Subjective/Objective Assessment:   hypertensive crisis     Action/Plan:   home when stable   Anticipated DC Date:  05/26/2013   Anticipated DC Plan:  HOME/SELF CARE  In-house referral  NA      DC Planning Services  CM consult  [facility]      Choice offered to / List presented to:             Status of service:  In process, will continue to follow Medicare Important Message given?  NA - LOS <3 / Initial given by admissions (If response is "NO", the following Medicare IM given date fields will be blank) Date Medicare IM given:   Date Additional Medicare IM given:    Discharge Disposition:    Per UR Regulation:  Reviewed for med. necessity/level of care/duration of stay  If discussed at Long Length of Stay Meetings, dates discussed:    Comments:  [id number]/Tavien Chestnut Stark JockDavis, RN, BSN, [place] [phone] Chart Reviewed for discharge and hospital needs. Discharge needs at time of review:  None present will follow for needs. Review of patient progress due on [id number]. iv labetalol drip for control. Patient will need wellness clinic appt and match program for meds once stable and ready to be dcd.

## 2013-05-23 NOTE — ED Notes (Signed)
MD made aware no one has signed up for pt

## 2013-05-23 NOTE — H&P (Signed)
[facility] History and Physical  Amadi Yoshino ZOX:[id number] DOB: [birth year] DOA: 05/22/2013  Referring physician: ER physician. PCP: No primary provider on file.   Chief Complaint: Elevated blood pressure.  HPI: [name] is a 63 y.o. female with history of previous CVA and hypertension presently on herbal medications presents to the ER with complaints of elevated blood pressure. Patient states that over the last 2-3 days patient has been feeling increased tightness of her right upper extremity with some numbness in the right toes and numbness around her tongue. She has had previous stroke which has left her with numbness of the right side but patient states that over the last 2 days it has further worsened. She usually takes herbal medications for blood pressure which she has not taken for last 2 weeks. She checked her blood pressure and was found to be elevated and had gone to an urgent care center and was referred to the ER. In the ER patient was found to have systolic blood pressure in the 220s and diastolic in the 120s. Patient was started on labetalol infusion. In addition patient states that her last few days patient has been having stabbing pains in the abdomen. Denies any chest pain shortness of breath nausea vomiting diarrhea. Patient otherwise denies any difficulty speaking swallowing or any vision problems.   Review of Systems: As presented in the history of presenting illness, rest negative.  Past Medical History  Diagnosis Date  . Hypertension    Past Surgical History  Procedure Laterality Date  . Wisdom tooth extraction    . Minor breast biopsy     Social History:  reports that she has never smoked. She does not have any smokeless tobacco history on file. She reports that she does not drink alcohol or use illicit drugs. Where does patient live home. Can patient participate in ADLs? Yes.  Allergies  Allergen Reactions  . Penicillins Swelling  .  Sulfa Antibiotics Itching    Family History:  Family History  Problem Relation Age of Onset  . Heart failure Mother       Prior to Admission medications   Medication Sig Start Date End Date Taking? Authorizing Provider  Ascorbic Acid (VITAMIN C) 1000 MG tablet Take 1,000 mg by mouth every morning.    Yes Historical Provider, MD  B Complex-C (B-COMPLEX WITH VITAMIN C) tablet Take 1 tablet by mouth daily.     Yes Historical Provider, MD  Flaxseed, Linseed, (FLAX SEED OIL PO) Take 5 mLs by mouth every morning.    Yes Historical Provider, MD  LevOCARNitine (L-CARNITINE PO) Take 800 mg by mouth every morning.    Yes Historical Provider, MD  Multiple Vitamins-Iron (CHLORELLA PO) Take 2 tablets by mouth every morning.    Yes Historical Provider, MD  NATTOKINASE PO Take 1 tablet by mouth every morning.   Yes Historical Provider, MD  SPIRULINA PO Take 1 tablet by mouth every morning.   Yes Historical Provider, MD  ferrous sulfate 325 (65 FE) MG tablet Take 1 tablet (325 mg total) by mouth 2 (two) times daily. 02/01/11 02/01/12  Daleen Bo, MD    Physical Exam: Filed Vitals:   05/23/13 0530 05/23/13 0545 05/23/13 0600 05/23/13 0615  BP: 122/86 148/94 127/80 154/83  Pulse: 64 64 67 86  Temp:      TempSrc:      Resp: 17  13 21   Height:      Weight:      SpO2: 97% 97% 94% 95%  General:  Well-developed and nourished.  Eyes: Anicteric no pallor.  ENT: No discharge from the ears eyes nose mouth.  Neck: No mass felt.  Cardiovascular: S1-S2 heard.  Respiratory: No rhonchi or crepitations.  Abdomen: Soft nontender bowel sounds present. No guarding or rigidity.  Skin: No rash.  Musculoskeletal: No edema.  Psychiatric: Appears normal.  Neurologic: Alert awake oriented to time place and person. Moves all extremities 5 x 5. No facial symmetry. Tongue is midline.  Labs on Admission:  Basic Metabolic Panel:  Recent Labs Lab 05/22/13 2055 05/23/13 0226  NA 142 142  K 3.4*  3.5*  CL 103 102  CO2 27 27  GLUCOSE 94 95  BUN 13 10  CREATININE 0.96 0.88  CALCIUM 9.1 9.1   Liver Function Tests:  Recent Labs Lab 05/23/13 0226  AST 24  ALT 17  ALKPHOS 64  BILITOT 1.3*  PROT 7.7  ALBUMIN 4.0   No results found for this basename: LIPASE, AMYLASE,  in the last 168 hours No results found for this basename: AMMONIA,  in the last 168 hours CBC:  Recent Labs Lab 05/23/13 0427  WBC 7.9  HGB 14.0  HCT 41.0  MCV 90.9  PLT 172   Cardiac Enzymes:  Recent Labs Lab 05/23/13 0226  TROPONINI <0.30    BNP (last 3 results) No results found for this basename: PROBNP,  in the last 8760 hours CBG: No results found for this basename: GLUCAP,  in the last 168 hours  Radiological Exams on Admission: No results found.  EKG: Independently reviewed. Sinus rhythm with LBBB.  Assessment/Plan Principal Problem:   Hypertensive urgency Active Problems:   History of CVA (cerebrovascular accident)   Abdominal discomfort   1. Hypertensive emergency - patient has been started on labetalol infusion. I have ordered CT of the head and MRI of the brain to make sure there is no acute stroke. Once stroke was ruled out patient's blood pressure has to be controlled with different antihypertensives. Patient will be on neurochecks and swallow evaluation until then. If patient rules in for stroke further stroke workup to be done. 2. New LBBB - have discussed the EKG with Dr. Anne FuSkains cardiologist. At this time patient denies any chest pain but has had some chest discomfort off and on last week. Cycle cardiac markers and check 2-D echo. 3. History of previous CVA presently on no medications - pending CT head MRI brain. 4. Abdominal discomfort - patient's abdomen appears benign but patient states that she's been getting frequent stabbing pain in the epigastric area. Given the hypertension check CT abdomen and pelvis.   I have reviewed patient's old charts and labs.  Code Status:  For code.   Family Communication: None.   Disposition Plan: Admit to inpatient.    Vivika Poythress N. [facility] Pager [phone].  If 7PM-7AM, please contact night-coverage [email or web address] Password [email or web address] 05/23/2013, 6:37 AM

## 2013-05-23 NOTE — MAU Provider Note (Signed)
Attestation of Attending Supervision of Advanced Practitioner (CNM/NP): Evaluation and management procedures were performed by the Advanced Practitioner under my supervision and collaboration.  I have reviewed the Advanced Practitioner's note and chart, and I agree with the management and plan.  HARRAWAY-SMITH, Kaire Stary 6:54 AM

## 2013-05-23 NOTE — Progress Notes (Signed)
Patient Demographics  [name], is a 63 y.o. female, DOB - 10/27/1950, ZOX:[id number]  Admit date - 05/22/2013   Admitting Physician Eduard ClosArshad N Kakrakandy, MD  Outpatient Primary MD for the patient is No primary provider on file.  LOS - 1   Chief Complaint  Patient presents with  . Hypertension        Assessment & Plan    1.Hypertensive emergency - due to running out of home medications caused by financial constraints, blood pressure stable, stop IV labetalol infusion, placed on oral medications, titrate blood pressure medications and monitor clinically.       2. Right upper extremity tingling and numbness with history of CVA in the past. Currently symptom free, head CT is negative, MRI pending. Placed on aspirin 81 mg daily. Will monitor. patient has been started on labetalol infusion. Patient will be on neurochecks and swallow evaluation until then.      3.New LBBB - have discussed the EKG with Dr. Anne FuSkains cardiologist either admitting physician. At this time patient denies any chest pain but has had some chest discomfort off and on last week. Cycle cardiac markers and check 2-D echo.      4. Nonspecific Abdominal discomfort on admission - patient's abdomen appears benign but patient states that she's been getting frequent stabbing pain in the epigastric area. Given the hypertension check CT abdomen and pelvis was ordered upon admission which is pending.       Code Status: Full  Family Communication:    Disposition Plan: Home   Procedures CT head, CT abdomen pelvis, MRI brain, echo gram   Consults   Dr. Loraine LericheMark skains cardiology over the phone by admitting physician   Medications  Scheduled Meds: . amLODipine  10 mg Oral Daily  . carvedilol  6.25 mg Oral BID WC    . potassium chloride  40 mEq Oral Once   Continuous Infusions:  PRN Meds:.acetaminophen, cloNIDine, hydrALAZINE  DVT Prophylaxis  Lovenox   Lab Results  Component Value Date   PLT 172 05/23/2013    Antibiotics     Anti-infectives   None          Subjective:   [name] today has, No headache, No chest pain, mild generalized abdominal pain - No Nausea, No new weakness tingling or numbness, No Cough - SOB.    Objective:   Filed Vitals:   05/23/13 0615 05/23/13 0652 05/23/13 0800 05/23/13 0900  BP: 154/83 150/78  125/68  Pulse: 86 70  66  Temp:  97.3 F (36.3 C) 98 F (36.7 C)   TempSrc:  Oral Oral   Resp: 21 18  0  Height:  5\' 4"  (1.626 m)    Weight:  85.9 kg (189 lb 6 oz)    SpO2: 95% 100%  98%    Wt Readings from Last 3 Encounters:  05/23/13 85.9 kg (189 lb 6 oz)  10/24/09 89.359 kg (197 lb)  06/19/09 89.812 kg (198 lb)     Intake/Output Summary (Last 24 hours) at 05/23/13 1108 Last data filed at 05/23/13 0728  Gross per 24 hour  Intake      0 ml  Output    450 ml  Net   -450 ml  Physical Exam  Awake Alert, Oriented X 3, No new F.N deficits, Normal affect [facility].AT,PERRAL Supple Neck,No JVD, No cervical lymphadenopathy appriciated.  Symmetrical Chest wall movement, Good air movement bilaterally, CTAB RRR,No Gallops,Rubs or new Murmurs, No Parasternal Heave +ve B.Sounds, Abd Soft, Non tender, No organomegaly appriciated, No rebound - guarding or rigidity. No Cyanosis, Clubbing or edema, No new Rash or bruise      Data Review   Micro Results Recent Results (from the past 240 hour(s))  MRSA PCR SCREENING     Status: None   Collection Time    05/23/13  6:49 AM      Result Value Ref Range Status   MRSA by PCR NEGATIVE  NEGATIVE Final   Comment:            The GeneXpert MRSA Assay (FDA     approved for NASAL specimens     only), is one component of a     comprehensive MRSA colonization     surveillance program. It is not      intended to diagnose MRSA     infection nor to guide or     monitor treatment for     MRSA infections.    Radiology Reports Ct Head Wo Contrast  05/23/2013   CLINICAL DATA Headache and hypertension.  Right hand numbness  EXAM CT HEAD WITHOUT CONTRAST  TECHNIQUE Contiguous axial images were obtained from the base of the skull through the vertex without intravenous contrast.  COMPARISON CT 01/24/2011  FINDINGS Ventricle size is normal. Patchy hypodensity throughout the cerebral white matter bilaterally is similar to the prior study. No acute infarct. Negative for hemorrhage or mass. Calvarium is intact. Atherosclerotic disease.  IMPRESSION Chronic microvascular ischemia.  No acute abnormality.  SIGNATURE  Electronically Signed   By: Marlan Palau M.D.   On: 05/23/2013 10:52    CBC  Recent Labs Lab 05/23/13 0427 05/23/13 0750  WBC 7.9 8.5  HGB 14.0 13.7  HCT 41.0 39.2  PLT 172 172  MCV 90.9 90.5  MCH 31.0 31.6  MCHC 34.1 34.9  RDW 13.3 13.4  LYMPHSABS  --  2.6  MONOABS  --  0.8  EOSABS  --  0.1  BASOSABS  --  0.0    Chemistries   Recent Labs Lab 05/22/13 2055 05/23/13 0226 05/23/13 0750  NA 142 142 139  K 3.4* 3.5* 3.6*  CL 103 102 101  CO2 27 27 27   GLUCOSE 94 95 105*  BUN 13 10 14   CREATININE 0.96 0.88 1.08  CALCIUM 9.1 9.1 9.0  AST  --  24 23  ALT  --  17 15  ALKPHOS  --  64 57  BILITOT  --  1.3* 1.2   ------------------------------------------------------------------------------------------------------------------ estimated creatinine clearance is 57.3 ml/min (by C-G formula based on Cr of 1.08). ------------------------------------------------------------------------------------------------------------------ No results found for this basename: HGBA1C,  in the last 72 hours ------------------------------------------------------------------------------------------------------------------ No results found for this basename: CHOL, HDL, LDLCALC, TRIG, CHOLHDL,  LDLDIRECT,  in the last 72 hours ------------------------------------------------------------------------------------------------------------------ No results found for this basename: TSH, T4TOTAL, FREET3, T3FREE, THYROIDAB,  in the last 72 hours ------------------------------------------------------------------------------------------------------------------ No results found for this basename: VITAMINB12, FOLATE, FERRITIN, TIBC, IRON, RETICCTPCT,  in the last 72 hours  Coagulation profile No results found for this basename: INR, PROTIME,  in the last 168 hours  No results found for this basename: DDIMER,  in the last 72 hours  Cardiac Enzymes  Recent Labs Lab 05/23/13 0226 05/23/13 0750  TROPONINI <  0.30 <0.30   ------------------------------------------------------------------------------------------------------------------ No components found with this basename: POCBNP,      Time Spent in minutes   35   Susa Raring K M.D on 05/23/2013 at 11:08 AM  Between 7am to 7pm - Pager - [phone]  After 7pm go to [email or web address] - password [email or web address]  And look for the night coverage person covering for me after hours  [facility] Office  [phone]

## 2013-05-24 LAB — LIPID PANEL
CHOL/HDL RATIO: 2.7 ratio
Cholesterol: 150 mg/dL (ref 0–200)
HDL: 56 mg/dL (ref >39)
LDL Cholesterol: 77 mg/dL (ref 0–99)
Triglycerides: 85 mg/dL (ref <150)
VLDL: 17 mg/dL (ref 0–40)

## 2013-05-24 LAB — POTASSIUM: POTASSIUM: 3.7 meq/L (ref 3.7–5.3)

## 2013-05-24 LAB — GLUCOSE, CAPILLARY: Glucose-Capillary: 86 mg/dL (ref 70–99)

## 2013-05-24 MED ORDER — HYDRALAZINE HCL 50 MG PO TABS: 50.0000 mg | ORAL_TABLET | Freq: Three times a day (TID) | ORAL | Status: DC

## 2013-05-24 MED ORDER — HYDRALAZINE HCL 50 MG PO TABS
50.0000 mg | ORAL_TABLET | Freq: Three times a day (TID) | ORAL | Status: DC
  Administered 2013-05-24: 50 mg via ORAL
  Filled 2013-05-24 (×4): qty 1

## 2013-05-24 MED ORDER — CARVEDILOL 6.25 MG PO TABS: 6.2500 mg | ORAL_TABLET | Freq: Two times a day (BID) | ORAL | Status: DC

## 2013-05-24 MED ORDER — CLONIDINE HCL 0.1 MG PO TABS: 0.1000 mg | ORAL_TABLET | Freq: Four times a day (QID) | ORAL | Status: DC | PRN

## 2013-05-24 MED ORDER — CLONIDINE HCL 0.1 MG PO TABS: 0.1000 mg | ORAL_TABLET | Freq: Three times a day (TID) | ORAL | Status: DC

## 2013-05-24 MED ORDER — ASPIRIN 81 MG PO CHEW: 81.0000 mg | CHEWABLE_TABLET | Freq: Every day | ORAL | Status: DC

## 2013-05-24 MED ORDER — AMLODIPINE BESYLATE 10 MG PO TABS: 10.0000 mg | ORAL_TABLET | Freq: Every day | ORAL | Status: DC

## 2013-05-24 MED ORDER — OMEPRAZOLE 40 MG PO CPDR: 40.0000 mg | DELAYED_RELEASE_CAPSULE | Freq: Every day | ORAL | Status: DC

## 2013-05-24 MED ORDER — CLONIDINE HCL 0.1 MG PO TABS
0.1000 mg | ORAL_TABLET | Freq: Three times a day (TID) | ORAL | Status: DC
  Filled 2013-05-24 (×3): qty 1

## 2013-05-24 NOTE — Discharge Instructions (Signed)
Follow with Primary MD No primary provider on file. in 7 days   Get CBC, CMP, checked 7 days by Primary MD and again as instructed by your Primary MD. Get a 2 view Chest X ray done next visit if you had Pneumonia of Lung problems at the Hospital.   Activity: As tolerated with Full fall precautions use walker/cane & assistance as needed   Disposition Home     Diet: Heart Healthy   For Heart failure patients - Check your Weight same time everyday, if you gain over 2 pounds, or you develop in leg swelling, experience more shortness of breath or chest pain, call your Primary MD immediately. Follow Cardiac Low Salt Diet and 1.8 lit/day fluid restriction.   On your next visit with her primary care physician please Get Medicines reviewed and adjusted.  Please request your Prim.MD to go over all Hospital Tests and Procedure/Radiological results at the follow up, please get all Hospital records sent to your Prim MD by signing hospital release before you go home.   If you experience worsening of your admission symptoms, develop shortness of breath, life threatening emergency, suicidal or homicidal thoughts you must seek medical attention immediately by calling 911 or calling your MD immediately  if symptoms less severe.  You Must read complete instructions/literature along with all the possible adverse reactions/side effects for all the Medicines you take and that have been prescribed to you. Take any new Medicines after you have completely understood and accpet all the possible adverse reactions/side effects.   Do not drive and provide baby sitting services if your were admitted for syncope or siezures until you have seen by Primary MD or a Neurologist and advised to do so again.  Do not drive when taking Pain medications.    Do not take more than prescribed Pain, Sleep and Anxiety Medications  Special Instructions: If you have smoked or chewed Tobacco  in the last 2 yrs please stop smoking,  stop any regular Alcohol  and or any Recreational drug use.  Wear Seat belts while driving.   Please note  You were cared for by a hospitalist during your hospital stay. If you have any questions about your discharge medications or the care you received while you were in the hospital after you are discharged, you can call the unit and asked to speak with the hospitalist on call if the hospitalist that took care of you is not available. Once you are discharged, your primary care physician will handle any further medical issues. Please note that NO REFILLS for any discharge medications will be authorized once you are discharged, as it is imperative that you return to your primary care physician (or establish a relationship with a primary care physician if you do not have one) for your aftercare needs so that they can reassess your need for medications and monitor your lab values.

## 2013-05-24 NOTE — Progress Notes (Signed)
Pt discharged home via family; Pt and family given and explained all discharge instructions, carenotes, and prescriptions; pt and family stated understanding and denied questions/concerns; all f/u appointments in place; IV removed without complicaitons; pt stable at time of discharge  

## 2013-05-24 NOTE — Discharge Summary (Signed)
[name], is a 63 y.o. female  DOB Mar 04, 1951  MRN [id number].  Admission date:  05/22/2013  Admitting Physician  Eduard Clos, MD  Discharge Date:  05/24/2013   Primary MD  No primary provider on file.  Recommendations for primary care physician for things to follow:    Monitor BP, insure outpt Neurology, Cardiology follow up.    Admission Diagnosis  Unspecified essential hypertension [401.9] Abdominal discomfort [789.00] History of CVA (cerebrovascular accident) [V12.54] Hypertensive urgency [401.9]   Discharge Diagnosis  Unspecified essential hypertension [401.9] Abdominal discomfort [789.00] History of CVA (cerebrovascular accident) [V12.54] Hypertensive urgency [401.9]   Principal Problem:   Hypertensive urgency Active Problems:   History of CVA (cerebrovascular accident)   Abdominal discomfort      Past Medical History  Diagnosis Date  . Hypertension     Past Surgical History  Procedure Laterality Date  . Wisdom tooth extraction    . Minor breast biopsy       Discharge Condition: Stable    Follow UP  Follow-up Information   Follow up with [facility]    . Schedule an appointment as soon as possible for a visit in 1 week.   Contact information:   [address] [place] [address] [phone]      Follow up with Donato Schultz, MD. Schedule an appointment as soon as possible for a visit in 1 week.   Specialty:  Cardiology   Contact information:   [address] [place] [address] [phone]       Follow up with Gates Rigg, MD. Schedule an appointment as soon as possible for a visit in 1 week.   Specialties:  Neurology, Radiology   Contact information:   [address] [place]  [address] [phone]         Discharge Instructions  and  Discharge Medications          Discharge Orders   Future Orders Complete By Expires   Diet - low sodium heart healthy  As directed    Discharge instructions  As directed    Comments:     Follow with Primary MD No primary provider on file. in 7 days   Get CBC, CMP, checked 7 days by Primary MD and again as instructed by your Primary MD. Get a 2 view Chest X ray done next visit if you had Pneumonia of Lung problems at the Hospital.   Activity: As tolerated with Full fall precautions use walker/cane & assistance as needed   Disposition  Home    Diet: Heart Healthy    For Heart failure patients - Check your Weight same time everyday, if you gain over 2 pounds, or you develop in leg swelling, experience more shortness of breath or chest pain, call your Primary MD immediately. Follow Cardiac Low Salt Diet and 1.8 lit/day fluid restriction.   On your next visit with her primary care physician please Get Medicines reviewed and adjusted.  Please request your Prim.MD to go over all Hospital Tests and Procedure/Radiological results at the follow up, please get all Hospital records sent to your Prim MD by signing hospital release before you go home.   If you experience worsening of your admission symptoms, develop shortness of breath, life threatening emergency, suicidal or homicidal thoughts you must seek medical attention immediately by calling 911 or calling your MD immediately  if symptoms less severe.  You Must read complete instructions/literature along with all the possible adverse reactions/side effects for all the Medicines you take and that have been prescribed to you. Take any new Medicines after you have completely understood and accpet all the possible adverse reactions/side effects.   Do not drive and provide baby sitting services if your were admitted for syncope or siezures until you have seen by Primary MD or a  Neurologist and advised to do so again.  Do not drive when taking Pain medications.    Do not take more than prescribed Pain, Sleep and Anxiety Medications  Special Instructions: If you have smoked or chewed Tobacco  in the last 2 yrs please stop smoking, stop any regular Alcohol  and or any Recreational drug use.  Wear Seat belts while driving.   Please note  You were cared for by a hospitalist during your hospital stay. If you have any questions about your discharge medications or the care you received while you were in the hospital after you are discharged, you can call the unit and asked to speak with the hospitalist on call if the hospitalist that took care of you is not available. Once you are discharged, your primary care physician will handle any further medical issues. Please note that NO REFILLS for any discharge medications will be authorized once you are discharged, as it is imperative that you return to your primary care physician (or establish a relationship with a primary care physician if you do not have one) for your aftercare needs so that they can reassess your need for medications and monitor your lab values.   Discharge patient  As directed    Increase activity slowly  As directed        Medication List         amLODipine 10 MG tablet  Commonly known as:  NORVASC  Take 1 tablet (10 mg total) by mouth daily.     aspirin 81 MG chewable tablet  Chew 1 tablet (81 mg total) by mouth daily.     B-complex with vitamin C tablet  Take 1 tablet by mouth daily.     carvedilol 6.25 MG tablet  Commonly known as:  COREG  Take 1 tablet (6.25 mg total) by mouth 2 (two) times daily with a meal.     CHLORELLA PO  Take 2 tablets by mouth every morning.     cloNIDine 0.1 MG tablet  Commonly known as:  CATAPRES  Take 1 tablet (0.1 mg total) by mouth 3 (three) times daily.     ferrous sulfate 325 (65 FE) MG tablet  Take 1 tablet (325 mg total) by mouth 2 (two) times daily.      FLAX SEED OIL PO  Take 5 mLs by mouth every morning.     hydrALAZINE 50 MG tablet  Commonly known as:  APRESOLINE  Take 1 tablet (50 mg total) by mouth every 8 (eight) hours.     L-CARNITINE PO  Take 800 mg by mouth every morning.     NATTOKINASE PO  Take 1 tablet by mouth every morning.     omeprazole 40 MG capsule  Commonly known as:  PRILOSEC  Take 1 capsule (40 mg total) by mouth daily.     SPIRULINA PO  Take 1 tablet by mouth every morning.     vitamin C 1000 MG tablet  Take 1,000 mg by mouth every morning.          Diet and Activity recommendation: See Discharge Instructions above   Consults obtained -  Cardiology over the phone Dr Anne Fu   Major procedures and Radiology Reports - PLEASE review detailed and final reports for all details, in brief -       Ct Head Wo Contrast  05/23/2013   CLINICAL DATA Headache and hypertension.  Right hand numbness  EXAM CT HEAD WITHOUT CONTRAST  TECHNIQUE Contiguous axial images were obtained from the base of the skull through the vertex without intravenous contrast.  COMPARISON CT 01/24/2011  FINDINGS Ventricle size is normal. Patchy hypodensity throughout the cerebral white matter bilaterally is similar to the prior study. No acute infarct. Negative for hemorrhage or mass. Calvarium is intact. Atherosclerotic disease.  IMPRESSION Chronic microvascular ischemia.  No acute abnormality.  SIGNATURE  Electronically Signed   By: Marlan Palau M.D.   On: 05/23/2013 10:52   Mri Brain Without Contrast  05/23/2013   CLINICAL DATA Right hand numbness.  Hypertensive emergency.  Headaches.  EXAM MRI HEAD WITHOUT CONTRAST  TECHNIQUE Multiplanar, multiecho pulse sequences of the brain and surrounding structures were obtained without intravenous contrast.  COMPARISON CT HEAD W/O CM dated 05/23/2013  FINDINGS The diffusion-weighted images demonstrate no evidence for acute or subacute infarction. Extensive periventricular and subcortical white  matter changes are advanced for age. There is relatively little atrophy. No hemorrhage or mass lesion is present. Ventricles are of normal size. No significant extra-axial fluid collection is present.  Flow is present in the major intracranial arteries. The globes and orbits are intact. The paranasal sinuses are clear. There is minimal fluid in the left mastoid air cells. No obstructing nasopharyngeal lesion is evident.  IMPRESSION 1. Extensive periventricular and subcortical white matter changes are advanced for age. The finding is nonspecific but can be seen in the setting of chronic microvascular ischemia, a demyelinating process such as multiple sclerosis, vasculitis, complicated migraine headaches, or as the sequelae of a prior infectious or inflammatory process. The pattern is somewhat suspicious for a demyelinating process such as multiple sclerosis. 2. No acute intracranial abnormality. Specifically, no evidence for acute or subacute infarction.  SIGNATURE  Electronically Signed   By: Gennette Pac M.D.   On: 05/23/2013 16:47   Ct Abdomen Pelvis W Contrast  05/23/2013   CLINICAL DATA Abdominal discomfort, gas, history hypertension  EXAM CT ABDOMEN AND PELVIS WITH CONTRAST  TECHNIQUE Multidetector CT imaging of the abdomen and pelvis was performed using the standard protocol following bolus administration of intravenous contrast. Sagittal and coronal MPR images reconstructed from axial data set.  CONTRAST OMNIPAQUE IOHEXOL 300 MG/ML  SOLN.  Dilute oral contrast.  COMPARISON 04/13/2003 CT abdomen  FINDINGS Minimal dependent atelectasis right lower lobe these.  Umbilical hernia containing fat.  9 mm cyst laterally left kidney image 35.  Tiny nonobstructing calculus mid right kidney image 39.  Artifacts from dense contrast in right colon traverse right kidney.  Liver, spleen, pancreas, kidneys, and adrenal glands otherwise normal appearance.  Normal appendix.  Probable 3.0 x 2.5 cm diameter uterine  leiomyoma image 65.  Unremarkable adnexae.  Atherosclerotic calcification at aortic bifurcation.  Scattered diverticulosis of distal descending and sigmoid colon with questionable sigmoid wall thickening versus artifact from underdistention.  Decompressed bladder.  Incomplete gastric distention unable to exclude gastric wall thickening at proximal to mid stomach.  Small bowel loops unremarkable.  No mass, adenopathy, free fluid, or definite inflammatory process.  Degenerative facet disease changes lower lumbar spine.  IMPRESSION Distal colonic diverticulosis with questionable long segment of mild wall thickening versus artifact from underdistention, could be related to muscular hypertrophy or prior diverticulitis; no acute pericolic infiltrate changes identified to suggest acute diverticulitis.  Tiny nonobstructing right renal calculus.  Umbilical hernia containing fat.  Incompletely distended stomach, unable to exclude wall thickening at proximal to mid stomach.  Probable uterine leiomyoma.  SIGNATURE  Electronically Signed   By: Ulyses SouthwardMark  Boles M.D.   On: 05/23/2013 11:09    Echo - see full report, Ef 60%, Grade 2 diastolic dysfunction   Micro Results      Recent Results (from the past 240 hour(s))  MRSA PCR SCREENING     Status: None   Collection Time    05/23/13  6:49 AM      Result Value Ref Range Status   MRSA by PCR NEGATIVE  NEGATIVE Final   Comment:            The GeneXpert MRSA Assay (FDA     approved for NASAL specimens     only), is one component of a     comprehensive MRSA colonization     surveillance program. It is not     intended to diagnose MRSA     infection nor to guide or     monitor treatment for     MRSA infections.     History of present illness and  Hospital Course:     Kindly see H&P for history of present illness and admission details, please review complete Labs, Consult reports and Test reports for all details in brief [name], is a 63 y.o.  female, patient with history of HTN, CVA, was admitted to the hospital with hypertensive crisis subjective headaches and right-sided tingling and numbness, she was placed on IV labetalol drip, underwent head CT which was unremarkable.    Hypertensive emergency - due to running out of home medications caused by financial constraints, blood pressure stable, stopped IV labetalol infusion today, placed on oral medications, she stable she symptom-free will be discharged home.    Right upper extremity tingling and numbness with history of CVA in the past. Currently symptom free, head CT is negative, MRI shows nonspecific chronic changes suggestive of microvascular ischemia versus demyelination, placed on aspirin, outpatient followup with neurology recommended.      New LBBB - her case was discussed with Dr. Anne FuSkains cardiologist either admitting physician. At this time patient denies any chest pain, no chest pain since yesterday or discomfort, troponins negative, echogram shows preserved EF of 60% with chronic grade 2 diastolic dysfunction and with no wall motion abnormalities. Will be placed on aspirin and beta blocker with outpatient followup with cardiology.      Nonspecific Abdominal discomfort on admission - patient's abdomen appears benign but patient states that she's been getting frequent stabbing pain in the epigastric area. Her pain and discomfort has resolved, CT scan with no acute findings, possible uterine Leomyeoma will have her follow with her PCP and primary OB physician for that. Possible stomach thickening placed on omeprazole. If GI symptoms reoccur in the future consider GI  consultation in the outpatient setting.     Today   Subjective:   [name] today has no headache,no chest abdominal pain,no new weakness tingling or numbness, feels much better wants to go home today.    Objective:   Blood pressure 156/82, pulse 65, temperature 98.1 F (36.7 C), temperature  source Oral, resp. rate 17, height 5\' 4"  (1.626 m), weight 85.9 kg (189 lb 6 oz), SpO2 100.00%.   Intake/Output Summary (Last 24 hours) at 05/24/13 1116 Last data filed at 05/23/13 1958  Gross per 24 hour  Intake    480 ml  Output    700 ml  Net   -220 ml    Exam Awake Alert, Oriented *3, No new F.N deficits, Normal affect [name],PERRAL Supple Neck,No JVD, No cervical lymphadenopathy appriciated.  Symmetrical Chest wall movement, Good air movement bilaterally, CTAB RRR,No Gallops,Rubs or new Murmurs, No Parasternal Heave +ve B.Sounds, Abd Soft, Non tender, No organomegaly appriciated, No rebound -guarding or rigidity. No Cyanosis, Clubbing or edema, No new Rash or bruise  Data Review   CBC w Diff: Lab Results  Component Value Date   WBC 8.5 05/23/2013   HGB 13.7 05/23/2013   HCT 39.2 05/23/2013   PLT 172 05/23/2013   LYMPHOPCT 31 05/23/2013   MONOPCT 10 05/23/2013   EOSPCT 1 05/23/2013   BASOPCT 0 05/23/2013    CMP: Lab Results  Component Value Date   NA 139 05/23/2013   K 3.7 05/24/2013   CL 101 05/23/2013   CO2 27 05/23/2013   BUN 14 05/23/2013   CREATININE 1.08 05/23/2013   PROT 6.9 05/23/2013   ALBUMIN 3.6 05/23/2013   BILITOT 1.2 05/23/2013   ALKPHOS 57 05/23/2013   AST 23 05/23/2013   ALT 15 05/23/2013  .   Total Time in preparing paper work, data evaluation and todays exam - 35 minutes  Leroy Sea M.D on 05/24/2013 at 11:16 AM  [facility] Office  [phone]

## 2013-06-01 ENCOUNTER — Telehealth: Payer: Self-pay | Admitting: Family Medicine

## 2013-06-01 NOTE — Telephone Encounter (Signed)
Error

## 2013-06-01 NOTE — Telephone Encounter (Signed)
This is the daughter of Gwenyth AllegraOdenia Hamilton.  [facility].  Currently does not have a PCP.  She was on Bystolic and an OTC herbal supplement.  Because her bp was doing so well, she stopped the Bystolic and continued to take the OTC supplement.  She ran out of the supplement 2 weeks ago and BP spiked to "223/130", per pt. [facility]  Needs to be seen in a week per hospital discharge.  Please advise.  Thanks!

## 2013-06-04 ENCOUNTER — Ambulatory Visit (INDEPENDENT_AMBULATORY_CARE_PROVIDER_SITE_OTHER): Admitting: Cardiology

## 2013-06-04 ENCOUNTER — Encounter: Payer: Self-pay | Admitting: Cardiology

## 2013-06-04 VITALS — BP 124/88 | HR 72 | Ht 64.0 in | Wt 189.0 lb

## 2013-06-04 DIAGNOSIS — E669 Obesity, unspecified: Secondary | ICD-10-CM

## 2013-06-04 DIAGNOSIS — I160 Hypertensive urgency: Secondary | ICD-10-CM

## 2013-06-04 DIAGNOSIS — I447 Left bundle-branch block, unspecified: Secondary | ICD-10-CM

## 2013-06-04 DIAGNOSIS — I10 Essential (primary) hypertension: Secondary | ICD-10-CM

## 2013-06-04 DIAGNOSIS — Z8673 Personal history of transient ischemic attack (TIA), and cerebral infarction without residual deficits: Secondary | ICD-10-CM

## 2013-06-04 NOTE — Patient Instructions (Signed)
Your physician recommends that you continue on your current medications as directed. Please refer to the Current Medication list given to you today.  Your physician has requested that you have a lexiscan myoview. For further information please visit [email or web address]. Please follow instruction sheet, as given.  Your physician recommends that you schedule a follow-up appointment as needed  

## 2013-06-04 NOTE — Progress Notes (Signed)
[address] [place], [place]  [address] Phone: [phone] Fax:  [phone]  Date:  06/04/2013   ID:  [name], DOB 05/10/1950, MRN [id number]  PCP:  No primary provider on file.   History of Present Illness: [name] is a 63 y.o. female with history of hypertension, stroke admitted with hypertensive urgency with subjective headaches, right-sided tingling and numbness originally placed on IV labetalol for blood pressure 220/120, head CT unremarkable. She was restarted on medications. Had trouble getting her previous medications because of financial constraints. MRI of brain done as well showing chronic changes, microvascular ischemia. Aspirin. Outpatient followup with neurology recommended.   Original EKG demonstrated left bundle branch block. This was discussed via telephone with me. No chest pain but may have had discomfort off and on previous week. Echocardiogram with normal LV function.  Left tooth infection. Overall she's feeling better. She has been intolerant to a statin in the past. Not interested. She is going to get set up with primary physician.  Wt Readings from Last 3 Encounters:  06/04/13 189 lb (85.73 kg)  05/23/13 189 lb 6 oz (85.9 kg)  10/24/09 197 lb (89.359 kg)     Past Medical History  Diagnosis Date  . Hypertension     Past Surgical History  Procedure Laterality Date  . Wisdom tooth extraction    . Minor breast biopsy      Current Outpatient Prescriptions  Medication Sig Dispense Refill  . amLODipine (NORVASC) 10 MG tablet Take 1 tablet (10 mg total) by mouth daily.  30 tablet  0  . Ascorbic Acid (VITAMIN C) 1000 MG tablet Take 1,000 mg by mouth every morning.       . B Complex-C (B-COMPLEX WITH VITAMIN C) tablet Take 1 tablet by mouth daily.        . carvedilol (COREG) 6.25 MG tablet Take 1 tablet (6.25 mg total) by mouth 2 (two) times daily with a meal.  60 tablet  0  . cloNIDine (CATAPRES) 0.1 MG tablet  Take 1 tablet (0.1 mg total) by mouth 3 (three) times daily.  90 tablet  0  . Flaxseed, Linseed, (FLAX SEED OIL PO) Take 5 mLs by mouth every morning.       . hydrALAZINE (APRESOLINE) 50 MG tablet Take 50 mg by mouth 2 (two) times daily.      . LevOCARNitine (L-CARNITINE PO) Take 800 mg by mouth every morning.       . Multiple Vitamins-Iron (CHLORELLA PO) Take 2 tablets by mouth every morning.       Marland Kitchen. NATTOKINASE PO Take 1 tablet by mouth every morning.      Marland Kitchen. SPIRULINA PO Take 1 tablet by mouth every morning.       No current facility-administered medications for this visit.    Allergies:    Allergies  Allergen Reactions  . Penicillins Swelling  . Sulfa Antibiotics Itching    Social History:  The patient  reports that she has never smoked. She does not have any smokeless tobacco history on file. She reports that she does not drink alcohol or use illicit drugs.   Family History  Problem Relation Age of Onset  . Heart failure Mother   . Hypertension Father   . Hypertension Sister   . Hypertension Brother   . Hypertension Sister     ROS:  Please see the history of present illness.   Positive occasional abdominal discomfort, stabbing-like pain. Denies any  syncope, chest pain, shortness of breath, fevers   All other systems reviewed and negative.   PHYSICAL EXAM: VS:  BP 124/88  Pulse 72  Ht 5\' 4"  (1.626 m)  Wt 189 lb (85.73 kg)  BMI 32.43 kg/m2 Well nourished, well developed, in no acute distress HEENT: normal, [place]/AT, EOMI Neck: no JVD, normal carotid upstroke, no bruit Cardiac:  normal S1, S2; RRR; no murmur Lungs:  clear to auscultation bilaterally, no wheezing, rhonchi or rales Abd: soft, nontender, no hepatomegaly, no bruits Ext: no edema, 2+ distal pulses Skin: warm and dry GU: deferred Neuro: no focal abnormalities noted, AAO x 3  EKG:  05/22/13-left bundle branch block, sinus rhythm, PVCs  Echocardiogram: 05/23/13- EF 65%, pseudonormalization  Labs: 05/24/13- LDL  77, creatinine 1.08, potassium 3.6, troponin normal, hemoglobin 14, hemoglobin A1c 5.7  ASSESSMENT AND PLAN:  1. Hypertension-admitted 3/15 with hypertensive emergency, right-sided tingling/headache. Once medications were able to be resumed, improved. We discussed the rationale behind her medications. Ultimately, she states that she would not like to take any of the medications. This may not be an option for her. Low-salt diet. We also discussed the possibility of instead of clonidine or hydralazine trying ACE inhibitor and mild diuretic first. I will let her primary physician dictate this. With her previous stroke, I advocated statin use. She does not wish to take statin. Also I asked her to make sure that she has her thyroid hormone fully evaluated. Her TSH was slightly elevated. Continue to lose weight. This will also help with hypertension. 2. Obesity-as above 3. Elevated TSH-as above 4. Left bundle branch block-I will check a pharmacologic stress test to ensure that there is no evidence of ischemia. Echocardiogram reassuring. 5. We will followup with stress test.  Signed, Donato Schultz, MD [facility]  06/04/2013 8:40 AM

## 2013-06-08 ENCOUNTER — Encounter: Admitting: Internal Medicine

## 2013-06-08 NOTE — Telephone Encounter (Signed)
Ok but not until next mext month.

## 2013-06-12 NOTE — Telephone Encounter (Signed)
Pt has been sch for 06-19-13

## 2013-06-19 ENCOUNTER — Ambulatory Visit (INDEPENDENT_AMBULATORY_CARE_PROVIDER_SITE_OTHER): Admitting: Internal Medicine

## 2013-06-19 ENCOUNTER — Encounter: Payer: Self-pay | Admitting: Internal Medicine

## 2013-06-19 VITALS — BP 130/90 | HR 73 | Temp 98.4°F | Ht 62.5 in | Wt 188.0 lb

## 2013-06-19 DIAGNOSIS — Z5189 Encounter for other specified aftercare: Secondary | ICD-10-CM | POA: Insufficient documentation

## 2013-06-19 DIAGNOSIS — Z8673 Personal history of transient ischemic attack (TIA), and cerebral infarction without residual deficits: Secondary | ICD-10-CM

## 2013-06-19 DIAGNOSIS — I447 Left bundle-branch block, unspecified: Secondary | ICD-10-CM

## 2013-06-19 DIAGNOSIS — R0989 Other specified symptoms and signs involving the circulatory and respiratory systems: Secondary | ICD-10-CM

## 2013-06-19 DIAGNOSIS — R7989 Other specified abnormal findings of blood chemistry: Secondary | ICD-10-CM | POA: Insufficient documentation

## 2013-06-19 DIAGNOSIS — R946 Abnormal results of thyroid function studies: Secondary | ICD-10-CM

## 2013-06-19 DIAGNOSIS — R0609 Other forms of dyspnea: Secondary | ICD-10-CM

## 2013-06-19 DIAGNOSIS — I10 Essential (primary) hypertension: Secondary | ICD-10-CM

## 2013-06-19 DIAGNOSIS — R0683 Snoring: Secondary | ICD-10-CM | POA: Insufficient documentation

## 2013-06-19 DIAGNOSIS — E049 Nontoxic goiter, unspecified: Secondary | ICD-10-CM

## 2013-06-19 LAB — BASIC METABOLIC PANEL
BUN: 16 mg/dL (ref 6–23)
CHLORIDE: 102 meq/L (ref 96–112)
CO2: 29 meq/L (ref 19–32)
CREATININE: 1.1 mg/dL (ref 0.4–1.2)
Calcium: 9.2 mg/dL (ref 8.4–10.5)
GFR: 67.53 mL/min (ref >60.00)
GLUCOSE: 94 mg/dL (ref 70–99)
POTASSIUM: 4.2 meq/L (ref 3.5–5.1)
Sodium: 138 mEq/L (ref 135–145)

## 2013-06-19 LAB — TSH: TSH: 2.07 u[IU]/mL (ref 0.35–5.50)

## 2013-06-19 LAB — T4, FREE: Free T4: 0.95 ng/dL (ref 0.60–1.60)

## 2013-06-19 LAB — T3, FREE: T3, Free: 2.6 pg/mL (ref 2.3–4.2)

## 2013-06-19 NOTE — Progress Notes (Signed)
Chief Complaint  Patient presents with  . Establish Care    HPI: Patient comes in as new patient visit . Previous care was at very [name] places until she was hospitalized last month  From hosp for hypertensive emergency  Admitted for blood pressure control and noted she had razor sharp pains in her chest Clydie Braun blood pressure was very high; she went to the ED She has a history of CVA and non-Q-wave AMI 2010 I don't have the records  Previous care through   [facility] previous. 10 years  Ago . Husband was in the [employer] and then passed away from cancer.  Prev local care was through dr Asencion Islam  functional medicine and then cardiac  [place] when she had her event and release .  After stroke  2010   The no one . Primary care.   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  Medication off and  On and was controlled  .   bystolic given by dr Caprice Red and stopped  And ran out of it.  And hctz at begninng  Se feeling dry and had palpitation .  Added.  potassium  Had cva from ht  Was in a boot camp.  More overweight at that ime.  While exercise  developed Loss of vision on right  get up in the hospital. She has lost weight since that time but is still not optimal for her prefers not to take medicines. However she takes a lot of nontraditional supplements that she feels are natural and can bring her blood pressure down but is willing to treat what is necessary. Below is cardiology assessment last month outpatient followup  ASSESSMENT AND PLAN:  1. Hypertension-admitted 3/15 with hypertensive emergency, right-sided tingling/headache. Once medications were able to be resumed, improved. We discussed the rationale behind her medications. Ultimately, she states that she would not like to take any of the medications. This may not be an option for her. Low-salt diet. We also discussed the possibility of instead of clonidine or hydralazine trying ACE  inhibitor and mild diuretic first. I will let her primary physician dictate this. With her previous stroke, I advocated statin use. She does not wish to take statin. Also I asked her to make sure that she has her thyroid hormone fully evaluated. Her TSH was slightly elevated. Continue to lose weight. This will also help with hypertension. 2. Obesity-as above 3. Elevated TSH-as above 4. Left bundle branch block-I will check a pharmacologic stress test to ensure that there is no evidence of ischemia. Echocardiogram reassuring. 5. We will followup with stress test. Signed,  Donato Schultz, MD [facility]  06/04/2013 8:40 AM   ROS: Has noted a string-like thickening on the base of her neck question thyroid no family history GEN/ HEENT: No fever, significant weight changes sweats headaches hs strings of spider like in field of vision  vision problems hearing changes,may have  Sig snoring  fam hx of OSA seasonal allergies no asthma  CV/ PULM; No chest pain shortness of breath cough, syncope,has swelluing since out of hospital  But no change in exercise tolerance. GI /GU: No adominal pain, vomiting, change in bowel habits. No blood in the stool. No significant GU symptoms. SKIN/HEME: ,no acute skin rashes suspicious lesions or bleeding. No lymphadenopathy, nodules, masses.  NEURO/ PSYCH:  No neurologic signs such as weakness numbness. No depression hx depression when husband died  No meds  Ok now  No severe anxiety. IMM/ Allergy: No unusual infections.  Allergy .   REST of 12 system review negative except as per HPI or [place]     Past Medical History  Diagnosis Date  . Hypertension   . Arthritis   . Depression   . Seasonal allergies   . Stroke 09/2008    ? AMI?   Marland Kitchen Chicken pox   . Diverticulitis     Family History  Problem Relation Age of Onset  . Heart failure Mother   . Heart disease Mother   . Diabetes Mother   . Hypertension Father   . Hypertension Sister   . Hypertension Brother   . Cancer  Brother     Prostate  . Hypertension Sister   . Diabetes Maternal Grandmother     History   Social History  . Marital Status: Widowed    Spouse Name: N/A    Number of Children: N/A  . Years of Education: N/A   Social History Main Topics  . Smoking status: Never Smoker   . Smokeless tobacco: None  . Alcohol Use: Yes     Comment: Social Use only  . Drug Use: No  . Sexual Activity: None   Other Topics Concern  . 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 amLODipine (NORVASC) 10 MG tablet Take 1 tablet (10 mg total) by mouth daily.  . Ascorbic Acid (VITAMIN C) 1000 MG tablet Take 1,000 mg by mouth every morning.   . B Complex-C (B-COMPLEX WITH VITAMIN C) tablet Take 1 tablet by mouth daily.    . carvedilol (COREG) 6.25 MG tablet Take 1 tablet (6.25 mg total) by mouth 2 (two) times daily with a meal.  . Cholecalciferol (VITAMIN D PO) Take 1,000 Units by mouth daily.  . cloNIDine (CATAPRES) 0.1 MG tablet Take 1 tablet (0.1 mg total) by mouth 3 (three) times daily.  . Coenzyme Q10 (CO Q-10) 200 MG CAPS Take by mouth.  . Flaxseed, Linseed, (FLAX SEED OIL PO) Take 5 mLs by mouth every morning.   . hydrALAZINE (APRESOLINE) 50 MG tablet Take 50 mg by mouth 2 (two) times daily.  . MULTIPLE VITAMIN PO Take by mouth.  Marland Kitchen NATTOKINASE PO Take 1 tablet by mouth every morning.  Marland Kitchen POTASSIUM PO Take 99 mg by mouth.  . Probiotic Product (PROBIOTIC PO) Take by mouth. Bifido Beadlet 50+  . RA KRILL OIL 500 MG CAPS Take by mouth.  . SPIRULINA PO Take 1 tablet by mouth every morning.  . LevOCARNitine (L-CARNITINE PO) Take  800 mg by mouth every morning.   . [DISCONTINUED] Multiple Vitamins-Iron (CHLORELLA PO) Take 2 tablets by mouth every morning.     EXAM:  BP 130/90  Pulse 73  Temp(Src) 98.4 F (36.9 C) (Oral)  Ht 5' 2.5" (1.588 m)  Wt 188 lb (85.276 kg)  BMI 33.82 kg/m2  SpO2 96%  Body mass index is 33.82 kg/(m^2).  GENERAL: vitals reviewed and listed above, alert,  oriented, appears well hydrated and in no acute distress HEENT: atraumatic, conjunctiva  clear, no obvious abnormalities on inspection of external nose and ears OP : no lesion edema or exudate  NECK: no obvious masses on inspection   Palpable thyroid isthmus non tender  LUNGS: clear to auscultation bilaterally, no wheezes, rales or rhonchi, good air movement CV: HRRR, no clubbing cyanosis or  peripheral edema nl cap refill  MS: moves all extremities without noticeable focal  abnormality PSYCH: pleasant and cooperative, no obvious depression or anxiety Lab Results  Component Value Date   WBC 8.5 05/23/2013   HGB 13.7 05/23/2013   HCT 39.2 05/23/2013   PLT 172 05/23/2013   GLUCOSE 94 06/19/2013   CHOL 150 05/24/2013   TRIG 85 05/24/2013   HDL 56 05/24/2013   LDLCALC 77 05/24/2013   ALT 15 05/23/2013   AST 23 05/23/2013   NA 138 06/19/2013   K 4.2 06/19/2013   CL 102 06/19/2013   CREATININE 1.1 06/19/2013   BUN 16 06/19/2013   CO2 29 06/19/2013   TSH 2.07 06/19/2013   INR 1.04 02/01/2011   HGBA1C 5.7* 05/23/2013    ASSESSMENT AND PLAN:  Discussed the following assessment and plan:  Hypertension - ht heart disease? much better  strategies discussed had se of thioazide ran out of bystolic before presenting this time  - Plan: TSH, T4, free, T3, free, Basic metabolic panel, CANCELED: Thyroid antibodies  Elevated TSH - Plan: TSH, T4, free, T3, free, Basic metabolic panel, Thyroid antibodies, US Soft Tissue Head/Neck, CANCELED: Thyroid antibodies  Goiter - Plan: TSH, T4, free, T3, free, Basic metabolic panel, US Soft Tissue Head/Neck, CANCELED:  Thyroid antibodies  History of CVA (cerebrovascular accident) -  2010 elevated tropininfelt to be from accelerated ht. - Plan: TSH, T4, free, T3, free, Basic metabolic panel, CANCELED: Thyroid antibodies  Left bundle branch block (LBBB) on electrocardiogram - Plan: TSH, T4, free, T3, free, Basic metabolic panel, CANCELED: Thyroid antibodies  Alternative medicine - Taking medicines for blood pressure discussed risk benefit and currently not working agrees to stay on current medicine at this time  Snoring - Possibility of sleep apnea get more information if consistent consider more evaluation if appropriate. Could explain difficult hypertension Discussed importance of blood pressure control no matter what it takes and that lifestyle as off and on and off but certainly important at this point she should be controlled to avoid stroke and heart attack and heart failure. Discussed her left bundle branch block as a risk factor. She will followup with cardiology even if she did not agree with planned. I am unsure if she had a stroke and an MI or one or the other. Either way she has a vascular risk. Healthcare maintenance she is willing to do a colonoscopy doesn't remember her previous one but is overdue probably Not that interested in mammogram. Can discuss these things later. Lipid statin medicine was brought up by cardiology will discuss this later. Also told her we can discuss changing her medication around in the future but since her blood pressure was so much better would not want to change it today. -Patient advised to return or notify health care team  if symptoms worsen ,persist or new concerns arise.  Patient Instructions  Stay on same medications at this time . Consider switching some in the future   But first we should evaluated the thyroid function.  Will  Be contacted about  ultrasound tests of thyroid. Keep cardiology follow up. Suggest  get your colonoscopy   We can do a referral when  you are ready .   Plan preventive visit in    1-2 months no labs  Because already done      PaoniaWanda K. Adalai Perl M.D.  Pre visit review using our clinic review tool, if applicable. No additional management support is needed unless otherwise documented below in the visit note.  hosp summary  History of present illness and Hospital Course:  Kindly see H&P for history of present illness and admission details, please review complete Labs, Consult reports and Test reports for all details in brief Verlon SettingLina Brewington-Glenn, is a 63 y.o. female, patient with history of HTN, CVA, was admitted to the hospital with hypertensive crisis subjective headaches and right-sided tingling and numbness, she was placed on IV labetalol drip, underwent head CT which was unremarkable.  Hypertensive emergency - due to running out of home medications caused by financial constraints, blood pressure stable, stopped IV labetalol infusion today, placed on oral medications, she stable she symptom-free will be discharged home.  Right upper extremity tingling and numbness with history of CVA in the past. Currently symptom free, head CT is negative, MRI shows nonspecific chronic changes suggestive of microvascular ischemia versus demyelination, placed on aspirin, outpatient followup with neurology recommended.  New LBBB - her case was discussed with Dr. Anne FuSkains cardiologist either admitting physician. At this time patient denies any chest pain, no chest pain since yesterday or discomfort, troponins negative, echogram shows preserved EF of 60% with chronic grade 2 diastolic dysfunction and with no wall motion abnormalities. Will be placed on aspirin and beta blocker with outpatient followup with cardiology.  Nonspecific Abdominal discomfort on admission - patient's abdomen appears benign but patient states that she's been getting frequent stabbing pain in the epigastric area. Her pain and discomfort has resolved, CT scan with no acute  findings, possible uterine Leomyeoma will have her follow with her PCP and primary OB physician for that. Possible stomach thickening placed on omeprazole. If GI symptoms reoccur in the future consider GI consultation in the outpatient setting.  Prev hospitalizations  DATE OF ADMISSION: 09/24/2008  DATE OF DISCHARGE: 09/27/2008  DISCHARGE SUMMARY  REASON FOR ADMISSION: Right-sided numbness and peripheral visual loss  in the right eye.  FINAL DISCHARGE DIAGNOSES:  1. Acute left-sided cerebrovascular accident.  2. Dyslipidemia.  3. Elevated troponin.  Felt tp be from  Ht and not mi  4. Hypertensive urgency (accelerated hypertension).  5. Hypokalemia.  6. Elevated TSH.  7. Obesity.  CONSULTS DURING THIS ADMISSION:  1. Cardiology consult for elevated troponin.  2. Neurology consult for acute CVA.  PROCEDURES DONE DURING THIS ADMISSION: CT scan of the head without  contrast was read as having no acute intracranial abnormalities, but  extensive chronic microvascular white matter disease. There was an old  superior cerebellar infarct and old left basal ganglia lacunar infarct.  MRI of the brain was done subsequent to that and it showed acute infarct  in the left lateral thalamus. A 2-D echocardiogram was done and showed  65% ejection fraction with no obvious cardiac source of emboli. Carotid  Doppler was done and showed no significant extracranial carotid artery  stenosis. Vertebral arteries were patent bilaterally with antegrade  flow.

## 2013-06-19 NOTE — Patient Instructions (Signed)
Stay on same medications at this time . Consider switching some in the future   But first we should evaluated the thyroid function.  Will  Be contacted about  ultrasound tests of thyroid. Keep cardiology follow up. Suggest get your colonoscopy   We can do a referral when you are ready .   Plan preventive visit in    1-2 months no labs  Because already done

## 2013-06-20 ENCOUNTER — Encounter: Payer: Self-pay | Admitting: Cardiology

## 2013-06-20 ENCOUNTER — Telehealth: Payer: Self-pay | Admitting: Internal Medicine

## 2013-06-20 LAB — THYROID ANTIBODIES
THYROID PEROXIDASE ANTIBODY: 969 [IU]/mL — AB (ref <35.0)
Thyroglobulin Ab: <20 U/mL (ref <40.0)

## 2013-06-20 NOTE — Telephone Encounter (Signed)
Relevant patient education assigned to patient using Emmi. ° °

## 2013-06-22 ENCOUNTER — Other Ambulatory Visit: Payer: Self-pay | Admitting: Internal Medicine

## 2013-06-24 ENCOUNTER — Encounter: Payer: Self-pay | Admitting: Internal Medicine

## 2013-06-24 DIAGNOSIS — E063 Autoimmune thyroiditis: Secondary | ICD-10-CM | POA: Insufficient documentation

## 2013-06-25 ENCOUNTER — Encounter (HOSPITAL_COMMUNITY)

## 2013-06-25 ENCOUNTER — Ambulatory Visit (HOSPITAL_COMMUNITY): Attending: Cardiovascular Disease | Admitting: Radiology

## 2013-06-25 VITALS — BP 142/80 | Ht 64.0 in | Wt 186.0 lb

## 2013-06-25 DIAGNOSIS — I4949 Other premature depolarization: Secondary | ICD-10-CM

## 2013-06-25 DIAGNOSIS — R002 Palpitations: Secondary | ICD-10-CM | POA: Insufficient documentation

## 2013-06-25 DIAGNOSIS — R079 Chest pain, unspecified: Secondary | ICD-10-CM

## 2013-06-25 DIAGNOSIS — I447 Left bundle-branch block, unspecified: Secondary | ICD-10-CM

## 2013-06-25 DIAGNOSIS — Z8249 Family history of ischemic heart disease and other diseases of the circulatory system: Secondary | ICD-10-CM | POA: Insufficient documentation

## 2013-06-25 DIAGNOSIS — R0989 Other specified symptoms and signs involving the circulatory and respiratory systems: Secondary | ICD-10-CM | POA: Insufficient documentation

## 2013-06-25 DIAGNOSIS — R0609 Other forms of dyspnea: Secondary | ICD-10-CM | POA: Insufficient documentation

## 2013-06-25 DIAGNOSIS — R0789 Other chest pain: Secondary | ICD-10-CM | POA: Insufficient documentation

## 2013-06-25 MED ORDER — TECHNETIUM TC 99M SESTAMIBI GENERIC - CARDIOLITE
30.0000 | Freq: Once | INTRAVENOUS | Status: AC | PRN
  Administered 2013-06-25: 30 via INTRAVENOUS

## 2013-06-25 MED ORDER — ADENOSINE (DIAGNOSTIC) 3 MG/ML IV SOLN
0.5600 mg/kg | Freq: Once | INTRAVENOUS | Status: AC
  Administered 2013-06-25: 47.4 mg via INTRAVENOUS

## 2013-06-25 MED ORDER — TECHNETIUM TC 99M SESTAMIBI GENERIC - CARDIOLITE
10.0000 | Freq: Once | INTRAVENOUS | Status: AC | PRN
  Administered 2013-06-25: 10 via INTRAVENOUS

## 2013-06-25 NOTE — Progress Notes (Signed)
[facility] SITE 3 NUCLEAR MED [address] [place], [place] [address] [phone]    Cardiology Nuclear Med Study  [name] is a 63 y.o. female     MRN : [id number]     DOB: 09/19/1950  Procedure Date: 06/25/2013  Nuclear Med Background Indication for Stress Test:  Evaluation for Ischemia, 3/15 Post Hospital:Hypertensive emergency and New Diagnosis of LBBB History:  '10 MPI: NL EF: 65% 3/15 ECHO: EF: 65%  Cardiac Risk Factors: CVA, Family History - CAD, Hypertension and LBBB  Symptoms:  Chest Tightness, DOE and Palpitations   Nuclear Pre-Procedure Caffeine/Decaff Intake:  None NPO After: 12:00am   Lungs:  clear O2 Sat: 98% on room air. IV 0.9% NS with Angio Cath:  22g  IV Site: R Hand  IV Started by:  Cathlyn Parsonsynthia Hasspacher, RN  Chest Size (in):  40 Cup Size: C  Height: 5\' 4"  (1.626 m)  Weight:  186 lb (84.369 kg)  BMI:  Body mass index is 31.91 kg/(m^2). Tech Comments:  No Coreg x 12 hrs    Nuclear Med Study 1 or 2 day study: 1 day  Stress Test Type:  Adenosine  Reading MD: n/a  Order Authorizing Provider:  Louis MatteMark Skains,MD  Resting Radionuclide: Technetium 7633m Sestamibi  Resting Radionuclide Dose: 11.0 mCi   Stress Radionuclide:  Technetium 2833m Sestamibi  Stress Radionuclide Dose: 33.0 mCi           Stress Protocol Rest HR: 60 Stress HR: 89  Rest BP:127/60 Stress BP: 157/78  Exercise Time (min): n/a METS: n/a   Predicted Max HR: 138 bpm % Max HR: 56.52 bpm Rate Pressure Product: [id number]   Dose of Adenosine (mg):  47.3 Dose of Lexiscan: n/a mg  Dose of Atropine (mg): n/a Dose of Dobutamine: n/a mcg/kg/min (at max HR)  Stress Test Technologist: Milana NaSabrina Williams, EMT-P  Nuclear Technologist:  Doyne Keelonya Yount, CNMT     Rest Procedure:  Myocardial perfusion imaging was performed at rest 45 minutes following the intravenous administration of Technetium 6333m Sestamibi. Rest ECG: NSR-LVH  Stress Procedure:  The patient received IV adenosine at 140  mcg/kg/min for 4 minutes.  Technetium 5533m Sestamibi was injected at the 2 minute mark and quantitative spect images were obtained after a 45 minute delay. Stress ECG: PVC's, couplets and AIVR noted during the study  QPS Raw Data Images:  Mild breast attenuation.  Normal left ventricular size. Stress Images:  There is decreased uptake in the anterior wall. Rest Images:  There is decreased uptake in the anterior wall. Subtraction (SDS):  There is a fixed anteriour defect that is most consistent with breast attenuation. Transient Ischemic Dilatation (Normal <1.22):  0.99 Lung/Heart Ratio (Normal <0.45):  0.26  Quantitative Gated Spect Images QGS EDV:  N/A ml QGS ESV:  N/A ml  Impression Exercise Capacity:  Adenosine study with no exercise. BP Response:  Normal blood pressure response. Clinical Symptoms:  Dyspnea, left neck pressure ECG Impression:  Scattered PVC's, couplets and period of AIVR Comparison with Prior Nuclear Study: No significant change from previous study  Overall Impression:  Low risk stress nuclear study with fixed distal anteroseptal defect, worse at rest than stress. Suspect breast attenuation artifact, although study not gated. Frequent arrhythmias noted in the setting of adenosine infusion.  LV Ejection Fraction: Study not gated.  LV Wall Motion:  Non-gated.  Chrystie NoseKenneth C. Hilty, MD, [facility]ACC Board Certified in Nuclear Cardiology Attending Cardiologist [facility]

## 2013-06-27 NOTE — Telephone Encounter (Signed)
Are you taking over these medications?

## 2013-06-28 NOTE — Telephone Encounter (Signed)
Yes please refill x 6 months

## 2013-07-02 ENCOUNTER — Encounter: Payer: Self-pay | Admitting: Family Medicine

## 2013-07-06 ENCOUNTER — Ambulatory Visit
Admission: RE | Admit: 2013-07-06 | Discharge: 2013-07-06 | Disposition: A | Discharge status: Home / Self Care | Source: Ambulatory Visit | Attending: Internal Medicine | Admitting: Internal Medicine

## 2013-07-06 DIAGNOSIS — R7989 Other specified abnormal findings of blood chemistry: Secondary | ICD-10-CM

## 2013-07-06 DIAGNOSIS — E049 Nontoxic goiter, unspecified: Secondary | ICD-10-CM

## 2013-07-08 NOTE — ED Provider Notes (Signed)
CSN: [id number]     Arrival date & time 05/22/13  1926 History   First MD Initiated Contact with Patient 05/22/13 2158     Chief Complaint  Patient presents with  . Hypertension      [facility], hx HTN, noncompliant, c/o 1/10 HA, a+ox4, neuros intact,   Past Medical History  Diagnosis Date  . Hypertension   . Arthritis   . Depression   . Seasonal allergies   . Stroke 09/2008    ? AMI?   Marland Kitchen. Chicken pox   . Diverticulitis    Past Surgical History  Procedure Laterality Date  . Wisdom tooth extraction    . Minor breast biopsy  1979   Family History  Problem Relation Age of Onset  . Heart failure Mother   . Heart disease Mother   . Diabetes Mother   . Hypertension Father   . Hypertension Sister   . Hypertension Brother   . Cancer Brother     Prostate  . Hypertension Sister   . Diabetes Maternal Grandmother    History  Substance Use Topics  . Smoking status: Never Smoker   . Smokeless tobacco: Not on file  . Alcohol Use: Yes     Comment: Social Use only   OB History   Grav Para Term Preterm Abortions TAB SAB Ect Mult Living   2         2     Review of Systems  All other systems reviewed and are negative  Allergies  Penicillins and Sulfa antibiotics  Home Medications   Prior to Admission medications   Medication Sig Start Date End Date Taking? Authorizing Provider  Ascorbic Acid (VITAMIN C) 1000 MG tablet Take 1,000 mg by mouth every morning.    Yes Historical Provider, MD  B Complex-C (B-COMPLEX WITH VITAMIN C) tablet Take 1 tablet by mouth daily.     Yes Historical Provider, MD  Flaxseed, Linseed, (FLAX SEED OIL PO) Take 5 mLs by mouth every morning.    Yes Historical Provider, MD  LevOCARNitine (L-CARNITINE PO) Take 800 mg by mouth every morning.    Yes Historical Provider, MD  NATTOKINASE PO Take 1 tablet by mouth every morning.   Yes Historical Provider, MD  SPIRULINA PO Take 1 tablet by mouth every morning.   Yes Historical  Provider, MD  amLODipine (NORVASC) 10 MG tablet TAKE ONE TABLET BY MOUTH DAILY    Madelin HeadingsWanda K Panosh, MD  carvedilol (COREG) 6.25 MG tablet Take 1 tablet (6.25 mg total) by mouth 2 (two) times daily with a meal. 05/24/13   Leroy SeaPrashant K Singh, MD  Cholecalciferol (VITAMIN D PO) Take 1,000 Units by mouth daily.    Historical Provider, MD  cloNIDine (CATAPRES) 0.1 MG tablet TAKE ONE TABLET BY MOUTH EVERY 6 HOURS AS NEEDED    Madelin HeadingsWanda K Panosh, MD  Coenzyme Q10 (CO Q-10) 200 MG CAPS Take by mouth.    Historical Provider, MD  hydrALAZINE (APRESOLINE) 50 MG tablet TAKE 1 TABLET BY MOUTH EVERY 8 HOURS    Madelin HeadingsWanda K Panosh, MD  MULTIPLE VITAMIN PO Take by mouth.    Historical Provider, MD  POTASSIUM PO Take 99 mg by mouth.    Historical Provider, MD  Probiotic Product (PROBIOTIC PO) Take by mouth. Bifido Beadlet 50+    Historical Provider, MD  RA KRILL OIL 500 MG CAPS Take by mouth.    Historical Provider, MD   BP 156/82  Pulse 65  Temp(Src) 98.1 F (36.7 C) (Oral)  Resp 17  Ht 5\' 4"  (1.626 m)  Wt 189 lb 6 oz (85.9 kg)  BMI 32.49 kg/m2  SpO2 100% Physical Exam  Nursing note and vitals reviewed. Constitutional: She is oriented to person, place, and time. She appears well-developed and well-nourished. No distress.  HENT:  Head: Normocephalic and atraumatic.  Eyes: Pupils are equal, round, and reactive to light.  Neck: Normal range of motion.  Cardiovascular: Normal rate and intact distal pulses.   Pulmonary/Chest: No respiratory distress.  Abdominal: Normal appearance. She exhibits no distension.  Musculoskeletal: Normal range of motion.  Neurological: She is alert and oriented to person, place, and time. She has normal strength. No cranial nerve deficit. GCS eye subscore is 4. GCS verbal subscore is 5. GCS motor subscore is 6.  Skin: Skin is warm and dry. No rash noted.  Psychiatric: She has a normal mood and affect. Her behavior is normal.    ED Course  Procedures (including critical care  time) Labs Review Labs Reviewed  URINALYSIS, ROUTINE W REFLEX MICROSCOPIC - Abnormal; Notable for the following:    Specific Gravity, Urine <1.005 (*)    Hgb urine dipstick TRACE (*)    All other components within normal limits  URINE MICROSCOPIC-ADD ON - Abnormal; Notable for the following:    Bacteria, UA FEW (*)    All other components within normal limits  BASIC METABOLIC PANEL - Abnormal; Notable for the following:    Potassium 3.4 (*)    GFR calc non Af Amer 62 (*)    GFR calc Af Amer 72 (*)    All other components within normal limits  COMPREHENSIVE METABOLIC PANEL - Abnormal; Notable for the following:    Potassium 3.5 (*)    Total Bilirubin 1.3 (*)    GFR calc non Af Amer 69 (*)    GFR calc Af Amer 80 (*)    All other components within normal limits  COMPREHENSIVE METABOLIC PANEL - Abnormal; Notable for the following:    Potassium 3.6 (*)    Glucose, Bld 105 (*)    GFR calc non Af Amer 54 (*)    GFR calc Af Amer 62 (*)    All other components within normal limits  TSH - Abnormal; Notable for the following:    TSH 6.914 (*)    All other components within normal limits  HEMOGLOBIN A1C - Abnormal; Notable for the following:    Hemoglobin A1C 5.7 (*)    Mean Plasma Glucose 117 (*)    All other components within normal limits  GLUCOSE, CAPILLARY - Abnormal; Notable for the following:    Glucose-Capillary 101 (*)    All other components within normal limits  MRSA PCR SCREENING  TROPONIN I  CBC  TROPONIN I  TROPONIN I  CBC WITH DIFFERENTIAL  URINE RAPID DRUG SCREEN (HOSP PERFORMED)  GLUCOSE, CAPILLARY  LIPID PANEL  GLUCOSE, CAPILLARY  POTASSIUM  GLUCOSE, CAPILLARY          EKG Interpretation   Date/Time:  Tuesday May 22 2013 21:48:37 EDT Ventricular Rate:  78 PR Interval:  202 QRS Duration: 123 QT Interval:  413 QTC Calculation: 470 R Axis:   -45 Text Interpretation:  Sinus rhythm Multiple premature complexes, vent   Left bundle branch block  Nonspecific ST abnormality Confirmed by OPITZ   MD, BRIAN ([id number]) on 05/23/2013 2:14:14 AM      MDM   Final diagnoses:  HYPERTENSION  Nelia Shi, MD 07/08/13 [phone]

## 2013-07-09 ENCOUNTER — Telehealth: Payer: Self-pay | Admitting: Family Medicine

## 2013-07-09 NOTE — Telephone Encounter (Signed)
Patient called and left a message on my machine that she needs a refill of Coreg.  This was given to her in the hospital.  Will you be taking over prescription?  Please advise.  Thanks!

## 2013-07-10 ENCOUNTER — Other Ambulatory Visit: Payer: Self-pay | Admitting: Family Medicine

## 2013-07-10 DIAGNOSIS — E041 Nontoxic single thyroid nodule: Secondary | ICD-10-CM

## 2013-07-10 MED ORDER — CARVEDILOL 6.25 MG PO TABS: 6.2500 mg | ORAL_TABLET | Freq: Two times a day (BID) | ORAL | Status: DC

## 2013-07-10 NOTE — Telephone Encounter (Signed)
[facility].

## 2013-07-17 ENCOUNTER — Telehealth: Payer: Self-pay | Admitting: Internal Medicine

## 2013-07-17 NOTE — Telephone Encounter (Signed)
Patient Information:  Caller Name: [name]  Phone: [phone]  Patient: [name], [name]  Gender: Female  DOB: 10/29/1950  Age: [age] Years  PCP: [name], [name] (Family Practice)  Office Follow Up:  Does the office need to follow up with this patient?: Yes  Instructions For The Office: needs instructions on current BP meds and if anything should change based on her sxs   Symptoms  Reason For Call & Symptoms: BP 119/71 and HR69 and 0830; BP 131/72 and HR44 and 0840; readings were prior to taking meds; HR back to 60 currently; has been feeling drained; has no energy; ankles have swollen at times; also c/o cough for the past few days, but wondered if this is allergies; says the cough is random, even while she is in the house; feels nervous at times; feels like her heart is fast, but when she checks it, it is not; feels dry, but is drinking plenty of fluids; coloring in hands is darker; [name] is wondering if her meds are related to the sxs; started Apresoline 50mg  BID, Clonidine 0.1mg  q6h, Amlodipine 10mg  daily and Coreg 6.25mg  BID 3/12; was off Coreg for a wk because she was out, but restarted 4/28  Reviewed Health History In EMR: Yes  Reviewed Medications In EMR: Yes  Reviewed Allergies In EMR: Yes  Reviewed Surgeries / Procedures: Yes  Date of Onset of Symptoms: 07/16/2013  Guideline(s) Used:  High Blood Pressure  Disposition Per Guideline:   Discuss with PCP and Callback by Nurse Today  Reason For Disposition Reached:   Taking BP medications and feels is having side effects (e.g., impotence, cough, dizziness)  Advice Given:  N/A  Patient Will Follow Care Advice:  YES

## 2013-07-17 NOTE — Telephone Encounter (Signed)
[facility] be seen in the office.  Appt made for 07/18/2013 @ 3:45.

## 2013-07-18 ENCOUNTER — Ambulatory Visit (INDEPENDENT_AMBULATORY_CARE_PROVIDER_SITE_OTHER): Admitting: Internal Medicine

## 2013-07-18 ENCOUNTER — Encounter: Payer: Self-pay | Admitting: Internal Medicine

## 2013-07-18 VITALS — BP 126/82 | HR 72 | Temp 98.6°F | Ht 62.5 in | Wt 185.0 lb

## 2013-07-18 DIAGNOSIS — I447 Left bundle-branch block, unspecified: Secondary | ICD-10-CM

## 2013-07-18 DIAGNOSIS — E063 Autoimmune thyroiditis: Secondary | ICD-10-CM | POA: Diagnosis not present

## 2013-07-18 DIAGNOSIS — I10 Essential (primary) hypertension: Secondary | ICD-10-CM

## 2013-07-18 DIAGNOSIS — R5383 Other fatigue: Secondary | ICD-10-CM

## 2013-07-18 DIAGNOSIS — K089 Disorder of teeth and supporting structures, unspecified: Secondary | ICD-10-CM | POA: Insufficient documentation

## 2013-07-18 DIAGNOSIS — E049 Nontoxic goiter, unspecified: Secondary | ICD-10-CM | POA: Diagnosis not present

## 2013-07-18 DIAGNOSIS — E041 Nontoxic single thyroid nodule: Secondary | ICD-10-CM

## 2013-07-18 DIAGNOSIS — R5381 Other malaise: Secondary | ICD-10-CM

## 2013-07-18 NOTE — Assessment & Plan Note (Signed)
Reviewed ultrasound with patient as well as thyroid tests consistent with Hashimoto's thyroiditis or multinodular goiter with predominant mitral it needs biopsy.

## 2013-07-18 NOTE — Patient Instructions (Addendum)
It is possible that tgoing off and on the medicine could be part of the problem but concern that you had a low pulse at some point.   I want you to see cardiology  We need to get you appt.   Ask them about the tooth isse.   i will send a message also to the cardiologist  About this  i am concerned that you pulse goes up and down and unceratin ifthe meidcation could be part of the problem or perhaps you should have a loop recorder to see if getting  rhythym problems .  Please  Proceed. With thyroid biopsy after this assessment.

## 2013-07-18 NOTE — Progress Notes (Signed)
Chief Complaint  Patient presents with  . Follow-up    HPI: Comes in as requested because of sx defined in CAN   Had ran out of coreg for a week and then restarted   Felt tired  And pulse slow reported 50 range  .Pulse up and dpown 45 and then 60 .  Not really SOB but body a low burn. And right arm hand tight. Feels extremely tired and has not felt this way before but no specific chest pain shortness of breath she has had some swelling in her feet over the last couple days. If she puts them up it does decrease. To have thyroid biopsy but  had questions and not yet scheduled  For a week was oiut of coreg cause pharmacy. Didn't have it in stock and then did give her some that looked different she started it back since April 28. She wonders if her blood pressures too low at times in the 118 range. Denies any palpitations otherwise. She states that she had some type of cardiology test what looks like an adenosine stress test and cardiology was trying to call her and they playing phone tag and she didn't have a followup. Visit that she knows of. Cough  At night involuntary movements   At night.  She describes them as twitchy.  Left  Lower molar   has lost a filling 3-4 times wants it to get fixed no fever or pain at this point but wonders if it's making her feel bad and she doesn't want to have a mercury Because of it worries about exposure is saving up to get a porcelain   ROS: See pertinent positives and negatives per HPI. She is on a few herbals but nothing new mostly food dehydrated. She is taking over-the-counter potassium pills.  Past Medical History  Diagnosis Date  . Hypertension   . Arthritis   . Depression   . Seasonal allergies   . Stroke 09/2008    ? AMI?   Marland Kitchen Chicken pox   . Diverticulitis     Family History  Problem Relation Age of Onset  . Heart failure Mother   . Heart disease Mother   . Diabetes Mother   . Hypertension Father   . Hypertension Sister   . Hypertension  Brother   . Cancer Brother     Prostate  . Hypertension Sister   . Diabetes Maternal Grandmother     History   Social History  . Marital Status: Widowed    Spouse Name: N/A    Number of Children: N/A  . Years of Education: N/A   Social History Main Topics  . Smoking status: Never Smoker   . Smokeless tobacco: None  . Alcohol Use: Yes     Comment: Social Use only  . Drug Use: No  . Sexual Activity: None   Other Topics Concern  . 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 amLODipine (NORVASC) 10 MG tablet TAKE ONE TABLET BY MOUTH DAILY  .  Ascorbic Acid (VITAMIN C) 1000 MG tablet Take 1,000 mg by mouth every morning.   . B Complex-C (B-COMPLEX WITH VITAMIN C) tablet Take 1 tablet by mouth daily.    . carvedilol (COREG) 6.25 MG tablet Take 1 tablet (6.25 mg total) by mouth 2 (two) times daily with a meal.  . Cholecalciferol (VITAMIN D PO) Take 1,000 Units by mouth daily.  . cloNIDine (CATAPRES) 0.1 MG tablet TAKE ONE TABLET BY MOUTH EVERY 6 HOURS AS NEEDED  . Coenzyme Q10 (CO Q-10) 200 MG CAPS Take by mouth.  . hydrALAZINE (APRESOLINE) 50 MG tablet TAKE 1 TABLET BY MOUTH EVERY 8 HOURS  . MULTIPLE VITAMIN PO Take by mouth.  . Multiple Vitamins-Iron (CHLORELLA PO) Take by mouth.  Marland Kitchen. NATTOKINASE PO Take 1 tablet by mouth every morning.  . NON FORMULARY [place]ley [place]  . POTASSIUM PO Take 99 mg by mouth.  . Probiotic Product (PROBIOTIC PO) Take by mouth. Bifido Beadlet 50+  . RA KRILL OIL 500 MG CAPS Take by mouth.  . SPIRULINA PO Take 1 tablet by mouth every morning.  . Flaxseed, Linseed, (FLAX SEED OIL  PO) Take 5 mLs by mouth every morning.   . LevOCARNitine (L-CARNITINE PO) Take 800 mg by mouth every morning.     EXAM:  BP 126/82  Pulse 72  Temp(Src) 98.6 F (37 C) (Oral)  Ht 5' 2.5" (1.588 m)  Wt 185 lb (83.915 kg)  BMI 33.28 kg/m2  SpO2 96%  Body mass index is 33.28 kg/(m^2). Repeat blood pressure by me was 144/80 her machine wrist was 144/96 on the left. Review of her readings showed mostly in control below 140/90 occasional 118 pulse is usually in the 70s but occasionally 45 and 50. Wyatt respirations no respiratory distress nontoxic GENERAL: vitals reviewed and listed above, alert, oriented, appears well hydrated and in no acute distress HEENT: atraumatic, conjunctiva  clear, no obvious abnormalities on inspection of external nose and ears left lower molar Off no swelling OP : no lesion edema or exudate  NECK: no obvious masses on inspection mild palpability of the left submandibular node nontender LUNGS: clear to auscultation bilaterally, no wheezes, rales or rhonchi, good air movement CV: HRRR, no clubbing cyanosis there is +1  peripheral edema nl cap refill  MS: moves all extremities without noticeable focal  abnormality PSYCH: pleasant and cooperative, no obvious depression or anxiety Lab Results  Component Value Date   WBC 8.5 05/23/2013   HGB 13.7 05/23/2013   HCT 39.2 05/23/2013   PLT 172 05/23/2013   GLUCOSE 94 06/19/2013   CHOL 150 05/24/2013   TRIG 85 05/24/2013   HDL 56 05/24/2013   LDLCALC 77 05/24/2013   ALT 15 05/23/2013   AST 23 05/23/2013   NA 138 06/19/2013   K 4.2 06/19/2013   CL 102 06/19/2013   CREATININE 1.1 06/19/2013   BUN 16 06/19/2013   CO2 29 06/19/2013   TSH 2.07 06/19/2013   INR 1.04 02/01/2011   HGBA1C 5.7* 05/23/2013    ASSESSMENT AND PLAN:  Discussed the following assessment and plan:  Hypertension - seemingly controlled  home monitor is acceptable for monitoring  Fatigue - Concern that this could be cardiac and/or related to her heart a vascular  medications and not her thyroid at this time. She's recorded a low pulse rate at times  Left bundle branch block (LBBB) on electrocardiogram  Autoimmune thyroiditis  Goiter  Right thyroid nodule  Disorder of tooth - pt doesnot want a mercury  filling Concern about low energy feeling and new onset edema and history of low pulse rate intermittently. She was off Corag and went back on but it doesn't seem to fit that pattern. Her medicines may need to be changed I am concerned she could be having some other arrhythmia or intermittent heart block or other rhythms .  I don't think her thyroid disease is causing her symptoms at this time but will need followup would have her proceed with the thyroid biopsy after she is evaluated by cardiology for the current symptoms. Encouraged her to sign up for the patient portal so communication will be better cassette is been an issue with intact. I would sure to make of the twitching at night he is on some supplements her last potassium was normal but may need a recheck. Keep her June appointment in the meantime We'll send a message to cardiology for him to contact her and get an appointment to address the symptoms. -Patient advised to return or notify health care team  if symptoms worsen ,persist or new concerns arise.  Patient Instructions  It is possible that tgoing off and on the medicine could be part of the problem but concern that you had a low pulse at some point.   I want you to see cardiology  We need to get you appt.   Ask them about the tooth isse.   i will send a message also to the cardiologist  About this  i am concerned that you pulse goes up and down and unceratin ifthe meidcation could be part of the problem or perhaps you should have a loop recorder to see if getting  rhythym problems .  Please  Proceed. With thyroid biopsy after this assessment.     Neta MendsWanda K. Panosh M.D.

## 2013-07-19 ENCOUNTER — Telehealth: Payer: Self-pay | Admitting: Cardiology

## 2013-07-19 NOTE — Telephone Encounter (Signed)
New message ° ° ° ° ° °Returned Kenyatta's call °

## 2013-07-20 NOTE — Telephone Encounter (Signed)
Follow up    Pt has not heard back from the nurse---she said please call her before you leave today

## 2013-07-24 NOTE — Telephone Encounter (Signed)
Follow up     Returning [name]'s call.  Pt is having ankle edema, heart "flip flopping", low energy, coughing,and dry mouth.

## 2013-07-24 NOTE — Telephone Encounter (Signed)
Patient saw PCP. Dr. Fabian SharpPanosh last week who recommends she f/u with her cardiologist. Patient has been feeling unwell for 2 weeks on/off and is concerned it has to do with all the medications she takes.  HR as low as high 30s at times. Ankle edema Sleeping poorly Dizzy Low energy   Since seeing her PCP she has cut her heart meds down. Taking Amlodipine--as ordered             Coreg 6.25 mg--1/2 tab bid  Hydralazine 50 mg  1/2 tab bid  Advised I would send message to Dr. Anne FuSkains for advice, poss add on appointment.

## 2013-07-25 ENCOUNTER — Encounter: Payer: Self-pay | Admitting: Cardiology

## 2013-07-25 ENCOUNTER — Ambulatory Visit (INDEPENDENT_AMBULATORY_CARE_PROVIDER_SITE_OTHER): Admitting: Cardiology

## 2013-07-25 VITALS — BP 136/96 | HR 84 | Ht 62.5 in | Wt 183.0 lb

## 2013-07-25 DIAGNOSIS — I447 Left bundle-branch block, unspecified: Secondary | ICD-10-CM | POA: Insufficient documentation

## 2013-07-25 DIAGNOSIS — I498 Other specified cardiac arrhythmias: Secondary | ICD-10-CM

## 2013-07-25 DIAGNOSIS — R001 Bradycardia, unspecified: Secondary | ICD-10-CM

## 2013-07-25 DIAGNOSIS — I10 Essential (primary) hypertension: Secondary | ICD-10-CM

## 2013-07-25 NOTE — Patient Instructions (Signed)
Schedule 24 hour holter monitor   Your physician recommends that you schedule a follow-up appointment in: 1 month

## 2013-07-25 NOTE — Progress Notes (Signed)
[address] [place], [place]  [address] Phone: [phone] Fax:  [phone]  Date:  07/25/2013   ID:  [name], DOB 09/06/1950, MRN [id number]  PCP:  Lorretta HarpPANOSH,WANDA KOTVAN, MD   History of Present Illness: [name] is a 63 y.o. female with history of hypertension, stroke admitted with hypertensive urgency with subjective headaches, right-sided tingling and numbness originally placed on IV labetalol for blood pressure 220/120, head CT unremarkable. She was restarted on medications. Had trouble getting her previous medications because of financial constraints. MRI of brain done as well showing chronic changes, microvascular ischemia. Aspirin. Outpatient followup with neurology recommended.   Original EKG demonstrated left bundle branch block. Echocardiogram with normal LV function.  She has been intolerant to a statin in the past. Not interested.  Dr. Fabian SharpPanosh has requested my assistance once again because of concern that she may have had a low pulse at some point. She also wanted me to address the tooth issue. She was concerned that pulse fluctuates up and down and is uncertain if the medication could be part of the problem or perhaps she may need further recording to see if rhythm problems are present. Waiting for my opinion prior to biopsy of thyroid.   Heart was fluttering, slow and fast.   Hydralazine and amlopdipine in the morning feels a mild fluttering. Mild cough. Edema in feet (? Amlodipine).   Wt Readings from Last 3 Encounters:  07/25/13 183 lb (83.008 kg)  07/18/13 185 lb (83.915 kg)  06/25/13 186 lb (84.369 kg)     Past Medical History  Diagnosis Date  . Hypertension   . Arthritis   . Depression   . Seasonal allergies   . Stroke 09/2008    ? AMI?   Marland Kitchen. Chicken pox   . Diverticulitis     Past Surgical History  Procedure Laterality Date  . Wisdom tooth extraction    . Minor breast biopsy  1979    Current Outpatient  Prescriptions  Medication Sig Dispense Refill  . amLODipine (NORVASC) 10 MG tablet TAKE ONE TABLET BY MOUTH DAILY  30 tablet  5  . Ascorbic Acid (VITAMIN C) 1000 MG tablet Take 1,000 mg by mouth every morning.       . B Complex-C (B-COMPLEX WITH VITAMIN C) tablet Take 1 tablet by mouth daily.        . carvedilol (COREG) 6.25 MG tablet Take 3.125 mg by mouth 2 (two) times daily with a meal.      . Cholecalciferol (VITAMIN D PO) Take 1,000 Units by mouth daily.      . cloNIDine (CATAPRES) 0.1 MG tablet TAKE 0.05 TABLET BY MOUTH twice a day with 6 hours in between each dose      . Coenzyme Q10 (CO Q-10) 200 MG CAPS Take by mouth.      . Flaxseed, Linseed, (FLAX SEED OIL PO) Take 5 mLs by mouth every morning.       . hydrALAZINE (APRESOLINE) 50 MG tablet TAKE 1/2 TABLET BY MOUTH EVERY 8 HOURS( once in the am and once in the pm)      . LevOCARNitine (L-CARNITINE PO) Take 800 mg by mouth every morning.       . MULTIPLE VITAMIN PO Take by mouth.      . Multiple Vitamins-Iron (CHLORELLA PO) Take by mouth.      Marland Kitchen. NATTOKINASE PO Take 1 tablet by mouth every morning. Helps blood flow      .  NON FORMULARY Barley Green      . POTASSIUM PO Take 99 mg by mouth.      . Probiotic Product (PROBIOTIC PO) Take by mouth. Bifido Beadlet 50+      . RA KRILL OIL 500 MG CAPS Take by mouth.      . SPIRULINA PO Take 1 tablet by mouth every morning.       No current facility-administered medications for this visit.    Allergies:    Allergies  Allergen Reactions  . Penicillins Swelling  . Sulfa Antibiotics Itching    Social History:  The patient  reports that she has never smoked. She does not have any smokeless tobacco history on file. She reports that she drinks alcohol. She reports that she does not use illicit drugs.   Family History  Problem Relation Age of Onset  . Heart failure Mother   . Heart disease Mother   . Diabetes Mother   . Hypertension Father   . Hypertension Sister   . Hypertension  Brother   . Cancer Brother     Prostate  . Hypertension Sister   . Diabetes Maternal Grandmother     ROS:  Please see the history of present illness.   Positive occasional abdominal discomfort, stabbing-like pain. Denies any syncope, chest pain, shortness of breath, fevers   All other systems reviewed and negative.   PHYSICAL EXAM: VS:  BP 136/96  Pulse 84  Ht 5' 2.5" (1.588 m)  Wt 183 lb (83.008 kg)  BMI 32.92 kg/m2 Well nourished, well developed, in no acute distress HEENT: normal, [place]/AT, EOMI Neck: no JVD, normal carotid upstroke, no bruit Cardiac:  normal S1, S2; RRR; no murmur Lungs:  clear to auscultation bilaterally, no wheezing, rhonchi or rales Abd: soft, nontender, no hepatomegaly, no bruits Ext: no edema, 2+ distal pulses Skin: warm and dry GU: deferred Neuro: no focal abnormalities noted, AAO x 3  EKG:  05/22/13-left bundle branch block, sinus rhythm, PVCs  Echocardiogram: 05/23/13- EF 65%, pseudonormalization  NUC stress 06/26/13 - low risk. No ischemia.  Labs: 05/24/13- LDL 77, creatinine 1.08, potassium 3.6, troponin normal, hemoglobin 14, hemoglobin A1c 5.7  ASSESSMENT AND PLAN:  1. Bradycardia-I will check an event monitor. 35 as low at home. On auscultation I didn't appreciate ectopic beats. This may be what she is feeling as well. Some of her shoulder pain/left upper chest wall pain maybe musculoskeletal. If Holter monitor is reassuring, she may proceed with dental work. 2. Hypertension-admitted 3/15 with hypertensive emergency, right-sided tingling/headache. Once medications were able to be resumed, improved. I'm fine with her taking half dose of the hydralazine as directed. Continue to take daily blood pressures and bring them with you to your followup appointment. Previously we discussed the rationale behind her medications. Ultimately, she states that she would not like to take any of the medications. This may not be an option for her. Low-salt diet. We also  discussed the possibility of instead of clonidine or hydralazine trying ACE inhibitor and mild diuretic first. I will let her primary physician dictate this. With her previous stroke, I advocated statin use. She does not wish to take statin. Awaiting biopsy. Continue to lose weight. This will also help with hypertension. 3. Obesity-as above 4. Elevated TSH-as above 5. Left bundle branch block-NUC stress reassuring. Echocardiogram reassuring. 6. 1 month follow up.   Signed, Donato SchultzMark Skains, MD [facility]  07/25/2013 2:51 PM

## 2013-07-25 NOTE — Telephone Encounter (Signed)
Have her come in today (2:15pm ok). Thanks.

## 2013-07-25 NOTE — Telephone Encounter (Signed)
lmovm Debbie Rilla Buckman RN  

## 2013-07-26 ENCOUNTER — Encounter (INDEPENDENT_AMBULATORY_CARE_PROVIDER_SITE_OTHER)

## 2013-07-26 ENCOUNTER — Encounter: Payer: Self-pay | Admitting: *Deleted

## 2013-07-26 DIAGNOSIS — I495 Sick sinus syndrome: Secondary | ICD-10-CM | POA: Diagnosis not present

## 2013-07-26 DIAGNOSIS — R001 Bradycardia, unspecified: Secondary | ICD-10-CM

## 2013-07-26 NOTE — Telephone Encounter (Signed)
LVM for patient to call the office regarding scheduling an appointment.

## 2013-07-26 NOTE — Progress Notes (Signed)
Patient ID: Verlon SettingLina Gordon, female   DOB: 06/03/1950, 63 y.o.   MRN: [id number] E-Cardio 24 hour holter monitor applied to patient.

## 2013-08-03 ENCOUNTER — Telehealth: Payer: Self-pay | Admitting: Family Medicine

## 2013-08-03 NOTE — Telephone Encounter (Signed)
Message copied by Nils Flack on Fri Aug 03, 2013  9:11 AM ------      Message from: [facility], [place] [facility]      Created: Thu Aug 02, 2013 12:29 PM      Regarding: thyroid biopsy       I know there has been and unexpected death in the family  And that could be the problem.             Misty could you contact Patient and see  When she can proceed with this procedure?      Thanks      WP      ----- Message -----         From: Alden Server, RN         Sent: 08/02/2013   8:43 AM           To: Madelin Headings, MD            Dr Fabian Sharp,            Just wanted to give you an update on the above patient:            Our office has made mutliple attempts to contact the above patient to schedule a thyroid biopsy.   The patient has not returned our calls.              Thanks!      Henri, RN      [facility]      [phone]                   ------

## 2013-08-10 NOTE — Telephone Encounter (Signed)
Left a message on home/cell for the pt to return my call. 

## 2013-08-10 NOTE — Telephone Encounter (Signed)
Patient called no answer.Left message to call me Dr.Skains reviewed 24 hour holter monitor.

## 2013-08-17 NOTE — Telephone Encounter (Signed)
Left a message on the patient's home/cell for her to return my call.

## 2013-08-20 ENCOUNTER — Telehealth: Payer: Self-pay

## 2013-08-20 NOTE — Telephone Encounter (Signed)
Patient called Dr.Skains reviewed monitor which revealed frequent PAC's,PVC's.Dr.Skains advised to increase coreg to 6.25 mg twice a day.Patient stated she has had a slow heart rate and she did not want to increase.Stated her heart rate ranges 34 to 50 beats/min.Stated she has never had a slow heart beat.Stated she has appointment with Dr.Skains 09/24/13 but would like to be seen sooner.Appointment scheduled with Dr.Skains 08/30/13 at 4:15 pm.

## 2013-08-21 ENCOUNTER — Encounter: Payer: Self-pay | Admitting: Internal Medicine

## 2013-08-21 ENCOUNTER — Ambulatory Visit (INDEPENDENT_AMBULATORY_CARE_PROVIDER_SITE_OTHER): Admitting: Internal Medicine

## 2013-08-21 VITALS — BP 138/86 | HR 76 | Temp 98.7°F | Ht 62.5 in | Wt 186.0 lb

## 2013-08-21 DIAGNOSIS — E063 Autoimmune thyroiditis: Secondary | ICD-10-CM | POA: Diagnosis not present

## 2013-08-21 DIAGNOSIS — I10 Essential (primary) hypertension: Secondary | ICD-10-CM

## 2013-08-21 DIAGNOSIS — E041 Nontoxic single thyroid nodule: Secondary | ICD-10-CM | POA: Diagnosis not present

## 2013-08-21 DIAGNOSIS — Z634 Disappearance and death of family member: Secondary | ICD-10-CM

## 2013-08-21 DIAGNOSIS — I447 Left bundle-branch block, unspecified: Secondary | ICD-10-CM | POA: Diagnosis not present

## 2013-08-21 NOTE — Telephone Encounter (Signed)
Patient seen in the office today.

## 2013-08-21 NOTE — Progress Notes (Signed)
Chief Complaint  Patient presents with  . Follow-up  . Hypertension    HPI: [name]  comes in today for follow up of  multiple medical problems.  Mostly sever ht management autoimmune thyroid disease with dominant nodule bx advised but delayed for various reasons  Including sudden death of nephew was "large and dm" but uncertain cause of death.  Bp readings  generally low   Doing ok has ? About poss underlying cause ( her husband had a pheo undx  ? related to agen orange exposure in [employer]) bno genetic endocrin  Relation.  Having concern about slow HR cause of carvedilol asks about bystolic as had good effect in past .   ASSESSMENT AND PLAN:  Per cardiology recnetly  1. Bradycardia-I will check an event monitor. 35 as low at home. On auscultation I didn't appreciate ectopic beats. This may be what she is feeling as well. Some of her shoulder pain/left upper chest wall pain maybe musculoskeletal. If Holter monitor is reassuring, she may proceed with dental work. 2. Hypertension-admitted 3/15 with hypertensive emergency, right-sided tingling/headache. Once medications were able to be resumed, improved. I'm fine with her taking half dose of the hydralazine as directed. Continue to take daily blood pressures and bring them with you to your followup appointment. Previously we discussed the rationale behind her medications. Ultimately, she states that she would not like to take any of the medications. This may not be an option for her. Low-salt diet. We also discussed the possibility of instead of clonidine or hydralazine trying ACE inhibitor and mild diuretic first. I will let her primary physician dictate this. With her previous stroke, I advocated statin use. She does not wish to take statin. Awaiting biopsy. Continue to lose weight. This will also help with hypertension. 3. Obesity-as above 4. Elevated TSH-as above 5. Left bundle branch block-NUC stress reassuring. Echocardiogram  reassuring. 6. 1 month follow up.  Signed,  Donato Schultz, MD [facility]  07/25/2013 2:51 PM  Krystal Elizabeth, LPN at 03/20/1094 1:52 PM    Status: Signed        Patient called Dr.Skains reviewed monitor which revealed frequent PAC's,PVC's.Dr.Skains advised to increase coreg to 6.25 mg twice a day.Patient stated she has had a slow heart rate and she did not want to increase.Stated her heart rate ranges 34 to 50 beats/min.Stated she has never had a slow heart beat.Stated she has appointment with Dr.Skains 09/24/13 but would like to be seen sooner.Appointment scheduled with Dr.Skains 08/30/13 at 4:15 pm.       ROS: See pertinent positives and negatives per HPI. No current cp sob   Past Medical History  Diagnosis Date  . Hypertension   . Arthritis   . Depression   . Seasonal allergies   . Stroke 09/2008    ? AMI?   Marland Kitchen Chicken pox   . Diverticulitis     Family History  Problem Relation Age of Onset  . Heart failure Mother   . Heart disease Mother   . Diabetes Mother   . Hypertension Father   . Hypertension Sister   . Hypertension Brother   . Cancer Brother     Prostate  . Hypertension Sister   . Diabetes Maternal Grandmother     History   Social History  . Marital Status: Widowed    Spouse Name: N/A    Number of Children: N/A  . Years of Education: N/A   Social History Main Topics  . Smoking status:  Never Smoker   . Smokeless tobacco: None  . Alcohol Use: Yes     Comment: Social Use only  . Drug Use: No  . Sexual Activity: None   Other Topics Concern  . 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 amLODipine (NORVASC) 10 MG tablet TAKE ONE TABLET BY MOUTH DAILY  . Ascorbic Acid (VITAMIN C) 1000 MG tablet Take 1,000 mg by mouth every morning.   . B Complex-C (B-COMPLEX WITH VITAMIN C) tablet Take 1 tablet by mouth daily.    . carvedilol (COREG) 6.25 MG tablet Take 3.125 mg by mouth 2 (two) times daily with a meal.  . Cholecalciferol (VITAMIN D PO) Take 1,000 Units by mouth daily.  . cloNIDine (CATAPRES) 0.1 MG tablet TAKE 0.05 TABLET BY MOUTH twice a day with 6 hours in between each dose  . Coenzyme Q10 (CO Q-10) 200 MG CAPS Take by mouth.  . Flaxseed, Linseed, (FLAX SEED OIL PO) Take 5 mLs by mouth every morning.   . hydrALAZINE (APRESOLINE) 50 MG tablet TAKE 1/2 TABLET BY MOUTH EVERY 8 HOURS( once in the am and once in the pm)  . LevOCARNitine (L-CARNITINE PO) Take 800 mg by mouth every morning.   . MULTIPLE VITAMIN PO Take by mouth.  . Multiple Vitamins-Iron (CHLORELLA PO) Take by mouth.  Marland Kitchen NATTOKINASE PO Take 1 tablet by mouth every morning. Helps blood flow  . NON FORMULARY Barley Green  . POTASSIUM PO Take 99 mg by mouth.  . Probiotic Product (PROBIOTIC PO) Take by mouth. Bifido Beadlet 50+  . RA KRILL OIL 500 MG CAPS Take by mouth.  . SPIRULINA PO Take 1 tablet by mouth every morning.    EXAM:  BP 138/86  Pulse 76  Temp(Src) 98.7 F (37.1 C) (Oral)  Ht 5' 2.5" (1.588 m)  Wt 186 lb (84.369 kg)  BMI 33.46 kg/m2  SpO2 98%  Body mass index is 33.46 kg/(m^2).  GENERAL: vitals reviewed and listed above, alert, oriented, appears well hydrated and in no acute distress HEENT: atraumatic, conjunctiva  clear, no obvious abnormalities on inspection of external nose and ears NECK: no obvious masses on inspection palpation bp reachecked LUNGS: clear to auscultation bilaterally, no wheezes, rales or rhonchi,  CV: HRRR, 68 no clubbing cyanosis or  peripheral edema nl cap refill   MS: moves all extremities without noticeable focal  abnormality PSYCH: pleasant and  cooperative, no obvious depression or anxiety Lab Results  Component Value Date   WBC 8.5 05/23/2013   HGB 13.7 05/23/2013   HCT 39.2 05/23/2013   PLT 172 05/23/2013   GLUCOSE 94 06/19/2013   CHOL 150 05/24/2013   TRIG 85 05/24/2013   HDL 56 05/24/2013   LDLCALC 77 05/24/2013   ALT 15 05/23/2013   AST 23 05/23/2013   NA 138 06/19/2013   K 4.2 06/19/2013   CL 102 06/19/2013   CREATININE 1.1 06/19/2013   BUN 16 06/19/2013   CO2 29 06/19/2013   TSH 2.07 06/19/2013  INR 1.04 02/01/2011   HGBA1C 5.7* 05/23/2013    ASSESSMENT AND PLAN:  Discussed the following assessment and plan:  Hypertension - adequate control consdier change meds doesnt seem like secondary ht but disc this with patient can also ask cards  Left bundle branch block  Left bundle branch block (LBBB) on electrocardiogram  Right thyroid nodule - to get needle bx  Autoimmune thyroiditis  Death of family member Reviewed how to take pulse for herself as sometimes machines missed beats. For now continue same meds consider change acei diuret.   Disc benefit asnd risk of vari meds .  Reviewed thyroid nodule eval needed and thyroid monitoring  Consider endo eval if needed she will contact about rescheduling that . Total visit 25mins > 50% spent counseling and coordinating care regarding test thyroid etc.   -Patient advised to return or notify health care team  if symptoms worsen ,persist or new concerns arise.  Patient Instructions  I agree to follow up with  Cardiology.  Ask the questions we discussed about coreg and other meds .   Proceed  With the thyroid   Needle  Biopsy.    Consider other work up.    If needed Advise checking thyroid function.   For now about every 4-6 months which is tsh and free t4  We may need to get endocrinology to see you.    [name] M.D.   Pre visit review using our clinic review tool, if applicable. No additional management support is needed unless otherwise documented below in the visit  note.

## 2013-08-21 NOTE — Patient Instructions (Signed)
I agree to follow up with  Cardiology.  Ask the questions we discussed about coreg and other meds .   Proceed  With the thyroid   Needle  Biopsy.    Consider other work up.    If needed Advise checking thyroid function.   For now about every 4-6 months which is tsh and free t4  We may need to get endocrinology to see you.

## 2013-08-23 ENCOUNTER — Encounter: Payer: Self-pay | Admitting: Internal Medicine

## 2013-08-24 DIAGNOSIS — Z634 Disappearance and death of family member: Secondary | ICD-10-CM | POA: Insufficient documentation

## 2013-08-30 ENCOUNTER — Ambulatory Visit: Admitting: Cardiology

## 2013-09-11 ENCOUNTER — Encounter: Payer: Self-pay | Admitting: Cardiology

## 2013-09-11 ENCOUNTER — Ambulatory Visit (INDEPENDENT_AMBULATORY_CARE_PROVIDER_SITE_OTHER): Admitting: Cardiology

## 2013-09-11 VITALS — BP 148/80 | HR 83 | Ht 63.0 in | Wt 186.0 lb

## 2013-09-11 DIAGNOSIS — E785 Hyperlipidemia, unspecified: Secondary | ICD-10-CM | POA: Diagnosis not present

## 2013-09-11 DIAGNOSIS — I4949 Other premature depolarization: Secondary | ICD-10-CM

## 2013-09-11 DIAGNOSIS — I10 Essential (primary) hypertension: Secondary | ICD-10-CM | POA: Diagnosis not present

## 2013-09-11 DIAGNOSIS — I447 Left bundle-branch block, unspecified: Secondary | ICD-10-CM

## 2013-09-11 DIAGNOSIS — I493 Ventricular premature depolarization: Secondary | ICD-10-CM | POA: Insufficient documentation

## 2013-09-11 NOTE — Patient Instructions (Signed)
The current medical regimen is effective;  continue present plan and medications.  Follow up as needed 

## 2013-09-11 NOTE — Progress Notes (Signed)
[address] [place], [place]  [address] Phone: [phone] Fax:  [phone]  Date:  09/11/2013   ID:  Khaliya Golinski, DOB 1950-12-25, MRN [id number]  PCP:  Lorretta Harp, MD   History of Present Illness: [name] is a 63 y.o. female with history of hypertension, stroke admitted with hypertensive urgency with subjective headaches, right-sided tingling and numbness originally placed on IV labetalol for blood pressure 220/120, head CT unremarkable. She was restarted on medications. Had trouble getting her previous medications because of financial constraints. MRI of brain done as well showing chronic changes, microvascular ischemia. Aspirin. Outpatient followup with neurology recommended.   Original EKG demonstrated left bundle branch block. Echocardiogram with normal LV function.  She has been intolerant to a statin in the past. Not interested.  Dr. Fabian Sharp has requested my assistance once again because of concern that she may have had a low pulse at some point. She was concerned that pulse fluctuates up and down and is uncertain if the medication could be part of the problem or perhaps she may need further recording to see if rhythm problems are present. Waiting for my opinion prior to biopsy of thyroid.   Heart was fluttering, slow and fast.   Hydralazine and amlopdipine in the morning feels a mild fluttering. Mild cough. Edema in feet (? Amlodipine).   Overall, she has had a reassuring cardiac workup. She did demonstrate frequent PVCs on Holter monitor as described below. No adverse arrhythmias. Nuclear stress test reassuring. Echocardiogram reassuring.  Wt Readings from Last 3 Encounters:  09/11/13 186 lb (84.369 kg)  08/21/13 186 lb (84.369 kg)  07/25/13 183 lb (83.008 kg)     Past Medical History  Diagnosis Date  . Hypertension   . Arthritis   . Depression   . Seasonal allergies   . Stroke 09/2008    ? AMI?   Marland Kitchen Chicken pox     . Diverticulitis     Past Surgical History  Procedure Laterality Date  . Wisdom tooth extraction    . Minor breast biopsy  1979    Current Outpatient Prescriptions  Medication Sig Dispense Refill  . amLODipine (NORVASC) 10 MG tablet TAKE ONE TABLET BY MOUTH DAILY  30 tablet  5  . Ascorbic Acid (VITAMIN C) 1000 MG tablet Take 1,000 mg by mouth every morning.       . B Complex-C (B-COMPLEX WITH VITAMIN C) tablet Take 1 tablet by mouth daily.        . carvedilol (COREG) 6.25 MG tablet Take 3.125 mg by mouth 2 (two) times daily with a meal.      . Cholecalciferol (VITAMIN D PO) Take 1,000 Units by mouth daily.      . cloNIDine (CATAPRES) 0.1 MG tablet TAKE 0.05 TABLET BY MOUTH twice a day with 6 hours in between each dose      . Coenzyme Q10 (CO Q-10) 200 MG CAPS Take by mouth.      . Flaxseed, Linseed, (FLAX SEED OIL PO) Take 5 mLs by mouth every morning.       . hydrALAZINE (APRESOLINE) 50 MG tablet TAKE 1/2 TABLET BY MOUTH EVERY 8 HOURS( once in the am and once in the pm)      . LevOCARNitine (L-CARNITINE PO) Take 800 mg by mouth every morning.       . MULTIPLE VITAMIN PO Take by mouth.      . Multiple Vitamins-Iron (CHLORELLA PO) Take by mouth.      Marland Kitchen  NATTOKINASE PO Take 1 tablet by mouth every morning. Helps blood flow      . NON FORMULARY Barley Green      . POTASSIUM PO Take 99 mg by mouth.      . Probiotic Product (PROBIOTIC PO) Take by mouth. Bifido Beadlet 50+      . RA KRILL OIL 500 MG CAPS Take by mouth.      . SPIRULINA PO Take 1 tablet by mouth every morning.       No current facility-administered medications for this visit.    Allergies:    Allergies  Allergen Reactions  . Penicillins Swelling  . Sulfa Antibiotics Itching    Social History:  The patient  reports that she has never smoked. She does not have any smokeless tobacco history on file. She reports that she drinks alcohol. She reports that she does not use illicit drugs.   Family History  Problem  Relation Age of Onset  . Heart failure Mother   . Heart disease Mother   . Diabetes Mother   . Hypertension Father   . Hypertension Sister   . Hypertension Brother   . Cancer Brother     Prostate  . Hypertension Sister   . Diabetes Maternal Grandmother     ROS:  Please see the history of present illness.   Positive occasional abdominal discomfort, stabbing-like pain. Denies any syncope, chest pain, shortness of breath, fevers   All other systems reviewed and negative.   PHYSICAL EXAM: VS:  BP 148/80  Pulse 83  Ht 5\' 3"  (1.6 m)  Wt 186 lb (84.369 kg)  BMI 32.96 kg/m2 Well nourished, well developed, in no acute distress HEENT: normal, [place]/AT, EOMI Neck: no JVD, normal carotid upstroke, no bruit Cardiac:  normal S1, S2; RRR; no murmur Lungs:  clear to auscultation bilaterally, no wheezing, rhonchi or rales Abd: soft, nontender, no hepatomegaly, no bruits Ext: no edema, 2+ distal pulses Skin: warm and dry GU: deferred Neuro: no focal abnormalities noted, AAO x 3  EKG:  05/22/13-left bundle branch block, sinus rhythm, PVCs  Echocardiogram: 05/23/13- EF 65%, pseudonormalization  NUC stress 06/26/13 - low risk. No ischemia.  Labs: 05/24/13- LDL 77, creatinine 1.08, potassium 3.6, troponin normal, hemoglobin 14, hemoglobin A1c 5.7  ASSESSMENT AND PLAN:  1. Bradycardia-There is no significant bradycardia on Holter monitor. Her minimum heart rate was in fact 52 beats per minute. It is likely that if she was taking her pulse during an episode of ventricular bigeminy or trigeminy that her effective pulse would be low because of the non-transmitted PVC. I explained this in detail. Thankfully, she does not have an inordinate amount of PVCs or PACs. Her PVC burden was 3619. No dangerously slow rhythms. She did have intermittent left bundle branch block and thankfully, her nuclear stress test was reassuring with no ischemia. Her echocardiogram also was reassuring showing no evidence of  cardiomyopathy. I'm comfortable with her continuing with her current medication dosing. She is hopeful that she will be able to come off of some medications in the future. Continue to exercise, lose weight and this may be a possibility. I'm comfortable with her proceeding with any procedures if necessary. 2. Hypertension-admitted 3/15 with hypertensive emergency, right-sided tingling/headache. Once medications were able to be resumed, improved. I'm fine with her taking half dose of the hydralazine as directed. With her previous stroke, I advocated statin use. She does not wish to take statin. 3. Obesity-as above 4. Left bundle branch block-NUC stress reassuring.  Echocardiogram reassuring. 5. PRN follow up.   Signed, Donato SchultzMark Skains, MD [facility]  09/11/2013 12:16 PM

## 2013-09-24 ENCOUNTER — Ambulatory Visit: Admitting: Cardiology

## 2013-11-22 ENCOUNTER — Telehealth: Payer: Self-pay | Admitting: Internal Medicine

## 2013-11-22 ENCOUNTER — Ambulatory Visit (INDEPENDENT_AMBULATORY_CARE_PROVIDER_SITE_OTHER): Admitting: Family Medicine

## 2013-11-22 ENCOUNTER — Encounter: Payer: Self-pay | Admitting: Family Medicine

## 2013-11-22 VITALS — BP 150/96 | HR 78 | Temp 97.5°F | Ht 63.0 in | Wt 188.5 lb

## 2013-11-22 DIAGNOSIS — Z8673 Personal history of transient ischemic attack (TIA), and cerebral infarction without residual deficits: Secondary | ICD-10-CM | POA: Diagnosis not present

## 2013-11-22 DIAGNOSIS — R002 Palpitations: Secondary | ICD-10-CM

## 2013-11-22 DIAGNOSIS — I10 Essential (primary) hypertension: Secondary | ICD-10-CM

## 2013-11-22 NOTE — Telephone Encounter (Signed)
Patient Information:  Caller Name: Ladiamond  Phone: [phone]  Patient: [name], [name]  Gender: Female  DOB: November 06, 1950  Age: 63 Years  PCP: Berniece Andreas (Family Practice)  Office Follow Up:  Does the office need to follow up with this patient?: No  Instructions For The Office: N/A  RN Note:  Appt scheduled for office today 11/22/13  at 13:30 with Dr. Selena Batten . Care advice given . Understanding expressed.  Symptoms  Reason For Call & Symptoms: Patient states her heart is "slipping".   She is currently  on Hydralazine  daily Bid, Amlodipine  once daily.  She has not taken her clonidine 0.1mg  and Coreg 6.25mg    for 1 week.  She state rhythm is not normal for 3 weeks.  Feels tired , not resting well. Denies Chest pain . No shortness of breath.  she has been seen by Cardiology for irregular pulse/Holter mointor- LBBB and discharged .  Reviewed Health History In EMR: Yes  Reviewed Medications In EMR: Yes  Reviewed Allergies In EMR: Yes  Reviewed Surgeries / Procedures: Yes  Date of Onset of Symptoms: 11/01/2013  Guideline(s) Used:  Heart Rate and Heartbeat Questions  Disposition Per Guideline:   See Today in Office  Reason For Disposition Reached:   Age > 60 years  Advice Given:  Call Back If:  Chest pain, lightheadedness, or difficulty breathing occurs  Heart beating more than 130 beats / minute  More than 3 extra or skipped beats / minute  You become worse.  Avoid Caffeine  Avoid caffeine-containing beverages (Reason: caffeine is a stimulant and can aggravate palpitations).  Examples of caffeine-containing beverages include coffee, tea, colas, [address], [employer], and some energy drinks.  Health Basics  Liquid Intake: Drink adequate liquids, 6-8 glasses of water daily.  Reassurance  Occasional extra heart beats are experienced by most everyone. Lack of sleep, stress, and caffeinated beverages can make palpitations worse.  Patients with anxiety or stress may  describe a "rapid heartbeat" or "pounding" in their chest from their heart beating.  Limit Alcohol:  Limit your alcohol consumption to no more than 2 drinks a day. Ideally, eliminate alcohol entirely for the next 2 weeks.  Avoid Diet Pills  : Do not use diet pills (Reason: they act as stimulants).  Limit Smoking:   Stop or reduce your smoking.  Patient Will Follow Care Advice:  YES  Appointment Scheduled:  11/22/2013 13:30:00 Appointment Scheduled Provider:  Selena Batten (TEXT 1st, after 20 mins can call), [facility] Client [facility])

## 2013-11-22 NOTE — Telephone Encounter (Signed)
Patient was seen today by Dr.Kim.

## 2013-11-22 NOTE — Progress Notes (Signed)
Pre visit review using our clinic review tool, if applicable. No additional management support is needed unless otherwise documented below in the visit note. 

## 2013-11-22 NOTE — Patient Instructions (Addendum)
-  never stop the clonidine suddenly - can cause rebound hypertension  -restart the coreg (carvedilol) which helps with the heart racing and palpitations  -follow up with your cardiologist and Dr. Fabian Sharp if persists back on medications

## 2013-11-22 NOTE — Progress Notes (Signed)
No chief complaint on file.   HPI:  Hypertension: -out of several medicaitons clonidine and coreg for 1 week  - PCP out of office this afternoon - reports thought couldn't get refill, but now getting refill - reports at th -seen by cardiologist after hypertensive emergency - BP improved once medications resumed -hx stroke - cardiologist advised statin which pt refused -denies: CP, SOB, DOE, HA, swelling  PVCs/Palpitaitons: -extensive evaluation with cardiologist recenlty with holter monitor and nuclear stress test - PVCs and intermittent LBBB -intermittent palpitations again recently since stopping coreg but doesn't know if supposed to be on this - hates being on a bunch of medications -advised to follow up with cardiologist if further issues but she doesn't know if she liked this cardiologist -considering calling her prior cardiologist -upset that cardiologist advised statin, does not understand this recommendation and does not want to take  ROS: See pertinent positives and negatives per HPI.  Past Medical History  Diagnosis Date  . Hypertension   . Arthritis   . Depression   . Seasonal allergies   . Stroke 09/2008    ? AMI?   Marland Kitchen Chicken pox   . Diverticulitis     Past Surgical History  Procedure Laterality Date  . Wisdom tooth extraction    . Minor breast biopsy  1979    Family History  Problem Relation Age of Onset  . Heart failure Mother   . Heart disease Mother   . Diabetes Mother   . Hypertension Father   . Hypertension Sister   . Hypertension Brother   . Cancer Brother     Prostate  . Hypertension Sister   . Diabetes Maternal Grandmother     History   Social History  . Marital Status: Widowed    Spouse Name: N/A    Number of Children: N/A  . Years of Education: N/A   Social History Main Topics  . Smoking status: Never Smoker   . Smokeless tobacco: None  . Alcohol Use: Yes     Comment: Social Use only  . Drug Use: No  . Sexual Activity: None    Other Topics Concern  . 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 Take 0.5 tablet daily, Disp: , Rfl: ;  Ascorbic Acid (VITAMIN C) 1000 MG tablet, Take 1,000 mg by mouth every morning. , Disp: , Rfl: ;  B Complex-C (B-COMPLEX WITH VITAMIN C) tablet, Take 1 tablet by mouth daily.  , Disp: , Rfl: ;  Cholecalciferol (VITAMIN D PO), Take 1,000 Units by mouth daily., Disp: , Rfl:  cloNIDine (CATAPRES) 0.1 MG tablet, TAKE 0.05 TABLET BY MOUTH twice a day with 6 hours in between each dose, Disp: , Rfl: ;  Coenzyme Q10 (CO Q-10) 200 MG CAPS, Take by mouth., Disp: , Rfl: ;  hydrALAZINE (APRESOLINE) 50 MG tablet, TAKE 1/2 TABLET BY MOUTH EVERY 8 HOURS( once in the am and once in the pm), Disp: , Rfl: ;  LevOCARNitine (L-CARNITINE PO), Take 800  mg by mouth every morning. , Disp: , Rfl:  MULTIPLE VITAMIN PO, Take by mouth., Disp: , Rfl: ;  Multiple Vitamins-Iron (CHLORELLA PO), Take by mouth., Disp: , Rfl: ;  NATTOKINASE PO, Take 1 tablet by mouth every morning. Helps blood flow, Disp: , Rfl: ;  NON FORMULARY, [place], Disp: , Rfl: ;  POTASSIUM PO, Take 99 mg by mouth., Disp: , Rfl: ;  Probiotic Product (PROBIOTIC PO), Take by mouth. Bifido Beadlet 50+, Disp: , Rfl: ;  RA KRILL OIL 500 MG CAPS, Take by mouth., Disp: , Rfl:  SPIRULINA PO, Take 1 tablet by mouth every morning., Disp: , Rfl:   EXAM:  Filed Vitals:   11/22/13 1341  BP: 150/96  Pulse: 78  Temp: 97.5 F (36.4 C)    Body mass index is 33.4 kg/(m^2).  GENERAL: vitals reviewed and listed above, alert, oriented, appears well hydrated and  in no acute distress  HEENT: atraumatic, conjunttiva clear, no obvious abnormalities on inspection of external nose and ears  NECK: no obvious masses on inspection  LUNGS: clear to auscultation bilaterally, no wheezes, rales or rhonchi, good air movement  CV: HRRR, no peripheral edema  MS: moves all extremities without noticeable abnormality  PSYCH: pleasant and cooperative, no obvious depression or anxiety  ASSESSMENT AND PLAN:  Discussed the following assessment and plan:  History of CVA (cerebrovascular accident)  Essential hypertension  Palpitations  -restart medications -warned of rebound when clonidine stopped -close follow up with PCP and cardiologist if symptoms persist after restarting medications -emergent precautions -discussed statin and she is adamant that she will not take this -Patient advised to return or notify a doctor immediately if symptoms worsen or persist or new concerns arise.  Patient Instructions  -never stop the clonidine suddenly - can cause rebound hypertension  -restart the coreg (carvedilol) which helps with the heart racing and palpitations  -follow up with your cardiologist and Dr. Fabian Sharp if persists back on medications     Zaviyar Rahal R.

## 2013-12-14 ENCOUNTER — Other Ambulatory Visit

## 2013-12-17 ENCOUNTER — Other Ambulatory Visit (INDEPENDENT_AMBULATORY_CARE_PROVIDER_SITE_OTHER)

## 2013-12-17 DIAGNOSIS — R946 Abnormal results of thyroid function studies: Secondary | ICD-10-CM

## 2013-12-17 DIAGNOSIS — R7989 Other specified abnormal findings of blood chemistry: Secondary | ICD-10-CM

## 2013-12-17 LAB — T4, FREE: Free T4: 1.03 ng/dL (ref 0.60–1.60)

## 2013-12-17 LAB — TSH: TSH: 2.55 u[IU]/mL (ref 0.35–4.50)

## 2013-12-21 ENCOUNTER — Encounter: Payer: Self-pay | Admitting: Internal Medicine

## 2013-12-21 ENCOUNTER — Ambulatory Visit (INDEPENDENT_AMBULATORY_CARE_PROVIDER_SITE_OTHER): Admitting: Internal Medicine

## 2013-12-21 VITALS — BP 170/110 | Temp 98.6°F | Wt 191.0 lb

## 2013-12-21 DIAGNOSIS — I10 Essential (primary) hypertension: Secondary | ICD-10-CM | POA: Insufficient documentation

## 2013-12-21 DIAGNOSIS — Z5189 Encounter for other specified aftercare: Secondary | ICD-10-CM

## 2013-12-21 DIAGNOSIS — I447 Left bundle-branch block, unspecified: Secondary | ICD-10-CM | POA: Diagnosis not present

## 2013-12-21 DIAGNOSIS — E063 Autoimmune thyroiditis: Secondary | ICD-10-CM | POA: Diagnosis not present

## 2013-12-21 DIAGNOSIS — I119 Hypertensive heart disease without heart failure: Secondary | ICD-10-CM | POA: Diagnosis not present

## 2013-12-21 DIAGNOSIS — Z2821 Immunization not carried out because of patient refusal: Secondary | ICD-10-CM

## 2013-12-21 DIAGNOSIS — Z8673 Personal history of transient ischemic attack (TIA), and cerebral infarction without residual deficits: Secondary | ICD-10-CM

## 2013-12-21 MED ORDER — CARVEDILOL 6.25 MG PO TABS: 6.2500 mg | ORAL_TABLET | Freq: Two times a day (BID) | ORAL | Status: DC

## 2013-12-21 NOTE — Patient Instructions (Signed)
Take  Carvedilol .625 mg twice a day  Every day  I do not know why it is not on your medication list. Take medicines same time of day ( not  Depending on when you eat.)  Take blood pressure readings twice a day for 7- 10 days  Record and get us the readings   .Send in readings      Then we will make decision about next step.  clonoidine and hydalazine  Are  Short acting medications for Bp  Carvedilol should be taken twice a day to get a smooth antihypertensive effect. .  Your thyroid is functioning normally  Now but you have autoimmune thyroid  Disease   consider seeing endocrinologist  .  Plan bring in your  BP machine aging to next visit  To check. We may have cardiology see you again.  ROV in 2-3 weeks or as needed .

## 2013-12-21 NOTE — Progress Notes (Signed)
Pre visit review using our clinic review tool, if applicable. No additional management support is needed unless otherwise documented below in the visit note.  Chief Complaint  Patient presents with  . Follow-up  . Hypertension    HPI: [name] comesin for fu of HT and control since hospitalized with severe hypertension status post CVA left bundle branch block and most recently palpitations. Since her last visit she still feels flip-flops of her heart in the morning and her monitor says that her pulse is in the 40s but then comes up into the 60s unrelated to her medications. Was told by cardiology that things were okay. She states her blood pressure at home was 137/70 range this morning.  And is surprised it is so high in the office. She continues to take carvedilol but only taking it once a day and it is no longer on her med list for whatever reason. She says bystolic had worked in the past .  She is taking 5 mg of amlodipine and a half of a 0.1 clonidine twice a day and a quarter of hydralazine twice a day because she feels bad when she takes it with fatigue and maybe elevated heart rate. She was surprised when the pharmacist said that 50 mg with a high dose.  takes a blood pressure reading in the morning and it is usually not bad;doesn't take her blood pressure medicines until she eats sometimes that's not until 10:30. She takes her afternoon and evening medicines with her evening meal. She has questions about calcium "leaching out of her bones" and wonders if the calcium channel blocker is part of the problem. Knee joinjt  Cracks .  Something  Teeth shifting.  Jaw bone .  Deep pain in knuckles  needl pain  In fingers  Burning pain.  Leg crackls . She gets constipation. Felt like legs give away at times.  She saw cardiology a few months ago and was told that her heart rhythm was okay and apparently had a Event/Holterr monitor her not felt to be serious problem.  Was told to go  on cholesterol medicine but she doesn't want to take a statin.  ROS: See pertinent positives and negatives per HPI. Alternative medicine started taking iodine again in regard to her thyroid has seen Dr.  Asencion IslamWanek. in the past question about low calcium level and another doctor chiropractor who said that her adrenal gland hormones were abnormal  Past Medical History  Diagnosis Date  . Hypertension   . Arthritis   . Depression   . Seasonal allergies   . Stroke 09/2008    ? AMI?   Marland Kitchen. Chicken pox   . Diverticulitis     Family History  Problem Relation Age of Onset  . Heart failure Mother   . Heart disease Mother   . Diabetes Mother   . Hypertension Father   . Hypertension Sister   . Hypertension Brother   . Cancer Brother     Prostate  . Hypertension Sister   . Diabetes Maternal Grandmother     History   Social History  . Marital Status: Widowed    Spouse Name: N/A    Number of Children: N/A  . Years of Education: N/A   Social History Main Topics  . Smoking status: Never Smoker   . Smokeless tobacco: None  . Alcohol Use: Yes     Comment: Social Use only  . Drug Use: No  . Sexual Activity: None   Other Topics  Concern  . 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 amLODipine (NORVASC) 10 MG tablet Take 5 mg by mouth daily.   . Ascorbic Acid (VITAMIN C) 1000 MG tablet Take 1,000 mg by mouth every morning.   . B Complex-C (B-COMPLEX WITH VITAMIN C) tablet Take 1 tablet by mouth daily.    . carvedilol (COREG) 6.25 MG tablet Take 1 tablet (6.25 mg total) by mouth 2 (two) times daily with a meal.  .  Cholecalciferol (VITAMIN D PO) Take 1,000 Units by mouth daily.  . cloNIDine (CATAPRES) 0.1 MG tablet TAKE 0.05 TABLET BY MOUTH twice a day with 6 hours in between each dose  . Coenzyme Q10 (CO Q-10) 200 MG CAPS Take by mouth.  . hydrALAZINE (APRESOLINE) 50 MG tablet Take 1/4 tab twice daily  . LevOCARNitine (L-CARNITINE PO) Take 800 mg by mouth every morning.   . MULTIPLE VITAMIN PO Take by mouth.  . Multiple Vitamins-Iron (CHLORELLA PO) Take by mouth.  Marland Kitchen NATTOKINASE PO Take 1 tablet by mouth every morning. Helps blood flow  . NON FORMULARY [place]  . POTASSIUM PO Take 99 mg by mouth.  . Probiotic Product (PROBIOTIC PO) Take by mouth. Bifido Beadlet 50+  . RA KRILL OIL 500 MG CAPS Take by mouth.  . SPIRULINA PO Take 1 tablet by mouth every morning.  . [DISCONTINUED] carvedilol (COREG) 6.25 MG tablet Take 1 tablet (6.25 mg total) by mouth 2 (two) times daily with a meal.  . [DISCONTINUED] carvedilol (COREG) 6.25 MG tablet Take 1 tablet (6.25 mg total) by mouth 2 (two) times daily with a meal.    EXAM:  BP 170/110  Temp(Src) 98.6 F (37 C) (Oral)  Wt 191 lb (86.637 kg)  Body mass index is 33.84 kg/(m^2). Repeat blood pressures 164/100 150 06/14/1994 158/98. Right and left large GENERAL: vitals reviewed and listed above, alert, oriented, appears well hydrated and in no acute distress LUNGS: clear to auscultation bilaterally, no wheezes, rales or rhonchi, good air movement CV: HRRR, pulse in the 70 range no clubbing cyanosis or  peripheral edema nl cap refill  PSYCH: pleasant and cooperative, no obvious depression or anxiety Lab Results  Component Value Date   WBC 8.5 05/23/2013   HGB 13.7 05/23/2013   HCT 39.2 05/23/2013   PLT 172 05/23/2013   GLUCOSE 94 06/19/2013   CHOL 150 05/24/2013   TRIG 85 05/24/2013   HDL 56 05/24/2013   LDLCALC 77 05/24/2013   ALT 15 05/23/2013   AST 23 05/23/2013   NA 138 06/19/2013   K 4.2 06/19/2013   CL 102 06/19/2013   CREATININE 1.1 06/19/2013   BUN 16  06/19/2013   CO2 29 06/19/2013   TSH 2.55 12/17/2013   INR 1.04 02/01/2011   HGBA1C 5.7* 05/23/2013    BP Readings from Last 3 Encounters:  12/21/13 170/110  11/22/13 150/96  09/11/13 148/80    ASSESSMENT AND PLAN:  Discussed the following assessment and plan:  Hypertensive heart disease without heart failure  Essential hypertension  Left bundle branch block  Autoimmune thyroiditis  Alternative medicine  Influenza vaccination declined  History of CVA (cerebrovascular accident) - with sever HT 3 15  very concerned that her blood pressure is so high in the office today. However she says okay this morning at home. 137 range?  Extraordinary amount of time trying to figure out  how she ended up with her current regimen she is on some that she cut in half on her own the med list is again incorrect discussed with her the clonidine should never be taken as needed.  On a  low dose could cause some low pulse rate and fatigue in adition. We assured her that amlodipine ccb have nothing to do with calcium metabolism but she is skeptical of Explanation. She is taking carvedilol although only taking a once a day and not twice a day uncertain why it is no longer on her med list someone must of taken it off Discussed having her see an endocrinologist about her autoimmune thyroiditis has many questions about how she could have this problem and what she should do to prevent it. She is adamant at this time and not taking a statin medicine but does want to have her blood pressure controlled. She takes multiple supplements don't know if it's affecting her blood pressure control. At this time she must take the carvedilol twice a day. Do not may take blood pressure medicine when necessary. I will try to review the chart about the next step. She doesn't like the way hydralazine makes her feel and have told her that the clonidine is him very low dose and may not be effective at this dose.  Close followup  observation appropriate she should contact us with her blood pressure readings in a week to 10 days and then followup in 3-4 weeks. She should bring her monitor in to see correlation again. -Patient advised to return or notify health care team  if symptoms worsen ,persist or new concerns arise.  Patient Instructions  Take  Carvedilol .625 mg twice a day  Every day  I do not know why it is not on your medication list. Take medicines same time of day ( not  Depending on when you eat.)  Take blood pressure readings twice a day for 7- 10 days  Record and get [name] the readings   .Send in readings      Then we will make decision about next step.  clonoidine and hydalazine  Are  Short acting medications for Bp  Carvedilol should be taken twice a day to get a smooth antihypertensive effect. .  Your thyroid is functioning normally  Now but you have autoimmune thyroid  Disease   consider seeing endocrinologist  .  Plan bring in your  BP machine aging to next visit  To check. We may have cardiology see you again.  ROV in 2-3 weeks or as needed .     Neta Mends. Panosh M.D.  Total visit > 50% spent counseling and coordinating care   reviewed of record. After patient left : We should probably plan for her to bring in all her bottles we can see what she is actually taking. Appears that she stopped the carvedilol at some point but I don't know what dose she was taking the hydralazine was supposed to be every 8 hours but then says take it twice a day. She was originally on 10 mg of amlodipine and I don't know when it  was cut in half  But i think done by patient herself..  agree we need to try to get her off of the hydralazine /clonidine if possible  Would perhaps like her to try  to ace diuretic combo .Marland Kitchen.Marland Kitchen.apparently had ? Se of thiazide in the past ?  uncertain if ever had acei  in the past...close fu in the next 1-2 weeks esp if bp is indeed up.

## 2013-12-22 DIAGNOSIS — I119 Hypertensive heart disease without heart failure: Secondary | ICD-10-CM | POA: Insufficient documentation

## 2013-12-22 NOTE — Assessment & Plan Note (Signed)
reviewed of record. After patient left : We should probably plan for her to bring in all her bottles we can see what she is actually taking. Appears that she stopped the carvedilol at some point but I don't know what dose she was taking  the hydralazine was supposed to be every 8 hours but then says take it twice a day.now taking 1/4 as makes her feel bad  She was originally on 10 mg of amlodipine and I don't know when it was cut in half  But i think done by patient herself..  agree we need to try to get her off of the hydralazine /clonidine if possible  Would perhaps like her to try  to ace diuretic combo .Marland Kitchen.Marland Kitchen.apparently had ? Se of thiazide in the past ?  uncertain if ever had acei  in the past...close fu in the next 1-2 weeks esp if bp is indeed up.

## 2014-01-11 ENCOUNTER — Encounter: Payer: Self-pay | Admitting: Internal Medicine

## 2014-01-11 ENCOUNTER — Ambulatory Visit (INDEPENDENT_AMBULATORY_CARE_PROVIDER_SITE_OTHER): Admitting: Internal Medicine

## 2014-01-11 VITALS — BP 138/90 | Temp 98.3°F | Ht 63.0 in | Wt 194.0 lb

## 2014-01-11 DIAGNOSIS — E063 Autoimmune thyroiditis: Secondary | ICD-10-CM

## 2014-01-11 DIAGNOSIS — R001 Bradycardia, unspecified: Secondary | ICD-10-CM

## 2014-01-11 DIAGNOSIS — I119 Hypertensive heart disease without heart failure: Secondary | ICD-10-CM

## 2014-01-11 DIAGNOSIS — Z2821 Immunization not carried out because of patient refusal: Secondary | ICD-10-CM

## 2014-01-11 DIAGNOSIS — T887XXS Unspecified adverse effect of drug or medicament, sequela: Secondary | ICD-10-CM | POA: Diagnosis not present

## 2014-01-11 DIAGNOSIS — Z8673 Personal history of transient ischemic attack (TIA), and cerebral infarction without residual deficits: Secondary | ICD-10-CM

## 2014-01-11 DIAGNOSIS — I499 Cardiac arrhythmia, unspecified: Secondary | ICD-10-CM

## 2014-01-11 DIAGNOSIS — T50905S Adverse effect of unspecified drugs, medicaments and biological substances, sequela: Secondary | ICD-10-CM

## 2014-01-11 DIAGNOSIS — Z5189 Encounter for other specified aftercare: Secondary | ICD-10-CM

## 2014-01-11 MED ORDER — LOSARTAN POTASSIUM 50 MG PO TABS: 25.0000 mg | ORAL_TABLET | Freq: Every day | ORAL | Status: DC

## 2014-01-11 NOTE — Patient Instructions (Addendum)
Ask your insurance  ..  about covering bystolic . stip the clonidine  for now  Stay on the amlodipine and coreg   Begin  Low dose arb  Good for heart  Continue to monitor and plan decreasing the hydralazine.  To once a day and then off.       Will review records.  About possibly getting 24 hours urine for cortisol and catecholamines metanephrines. ROV in a months otherwise

## 2014-01-11 NOTE — Progress Notes (Signed)
Pre visit review using our clinic review tool, if applicable. No additional management support is needed unless otherwise documented below in the visit note.  Chief Complaint  Patient presents with  . Follow-up  . Hypertension    HPI: [name] 63 y.o. lady with hypertensive heart disease left bundle branch block status post CVA hospitalization who comes in for follow-up of blood pressure management and control. Since her last visit her blood pressures have been okay in the high 130s 140 range occasionally lower in the teens in the afternoon. She brings in her wrist cough today. She takes morning pills amlodipine hydralazine a quarter of a pill at breakfast takes carvedilol and the half of the clonidine at the evening meal takes the second Coreg at the evening takes a quarter of hydralazine.  She reports again that she did so much better when she's was on the by systolic and just had run out is uncertain whether her insurance will not pay for or such.  She denies new symptoms but states that her bones are still creaking and thinks they're something with the medicines that is leaching calcium out of her bones. She continues to take her Boris LownKrill oil because she states that it does decrease blood pressure some. Her pulse is slow at times based on her monitor in sometimes into the 40s. No syncope or significant edema at this time She also talks about her adrenal glands that she had had a 24-hour urine [place] got the results back and that someone locally told her she had a cortisol adrenal problem who is a Landchiropractor. ROS: See pertinent positives and negatives per HPI.  Past Medical History  Diagnosis Date  . Hypertension   . Arthritis   . Depression   . Seasonal allergies   . Stroke 09/2008    ? AMI?   Marland Kitchen. Chicken pox   . Diverticulitis     Family History  Problem Relation Age of Onset  . Heart failure Mother   . Heart disease Mother   . Diabetes  Mother   . Hypertension Father   . Hypertension Sister   . Hypertension Brother   . Cancer Brother     Prostate  . Hypertension Sister   . Diabetes Maternal Grandmother     History   Social History  . Marital Status: Widowed    Spouse Name: N/A    Number of Children: N/A  . Years of Education: N/A   Social History Main Topics  . Smoking status: Never Smoker   . Smokeless tobacco: None  . Alcohol Use: Yes     Comment: Social Use only  . Drug Use: No  . Sexual Activity: None   Other Topics Concern  . 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 amLODipine (NORVASC) 10 MG tablet Take 5 mg by mouth daily.   . Ascorbic Acid (VITAMIN C) 1000 MG tablet Take 1,000 mg by mouth every morning.   . B Complex-C (B-COMPLEX  WITH VITAMIN C) tablet Take 1 tablet by mouth daily.    . carvedilol (COREG) 6.25 MG tablet Take 3.125 mg by mouth 2 (two) times daily with a meal.  . Cholecalciferol (VITAMIN D PO) Take 1,000 Units by mouth daily.  . cloNIDine (CATAPRES) 0.1 MG tablet TAKE 0.05 TABLET BY MOUTH twice a day with 6 hours in between each dose  . Coenzyme Q10 (CO Q-10) 200 MG CAPS Take by mouth.  . hydrALAZINE (APRESOLINE) 50 MG tablet Take 1/4 tab twice daily  . LevOCARNitine (L-CARNITINE PO) Take 800 mg by mouth every morning.   . MULTIPLE VITAMIN PO Take by mouth.  . Multiple Vitamins-Iron (CHLORELLA PO) Take by mouth.  Marland Kitchen NATTOKINASE PO Take 1 tablet by mouth every morning. Helps blood flow  . NON FORMULARY Barley Green  . POTASSIUM PO Take 99 mg by mouth.  . Probiotic Product (PROBIOTIC PO) Take by mouth.  Bifido Beadlet 50+  . RA KRILL OIL 500 MG CAPS Take by mouth.  . SPIRULINA PO Take 1 tablet by mouth every morning.  . [DISCONTINUED] carvedilol (COREG) 6.25 MG tablet Take 1 tablet (6.25 mg total) by mouth 2 (two) times daily with a meal.  . losartan (COZAAR) 50 MG tablet Take 0.5-1 tablets (25-50 mg total) by mouth daily.    EXAM:  BP 138/90 mmHg  Temp(Src) 98.3 F (36.8 C) (Oral)  Ht 5\' 3"  (1.6 m)  Wt 194 lb (87.998 kg)  BMI 34.37 kg/m2  Body mass index is 34.37 kg/(m^2).  GENERAL: vitals reviewed and listed above, alert, oriented, appears well hydrated and in no acute distress HEENT: atraumatic, conjunctiva  clear, no obvious abnormalities on inspection of external nose and ears NECK: no obvious masses on inspection palpation  Multiple blood pressures were taken today with her machine monitor wrist and with a large office cuff.  140/88-90 right arm large machine 139/84 142/84 128/80 158/100 (first reading)  It appears that her blood pressure readings decrease after sitting relaxing and deep breathing. CV: HRRR, no clubbing cyanosis or  peripheral edema nl cap refill  MS: moves all extremities without noticeable focal  abnormality PSYCH: pleasant and cooperative, no obvious depression or anxiety BP Readings from Last 3 Encounters:  01/11/14 138/90  12/21/13 170/110  11/22/13 150/96   Lab Results  Component Value Date   WBC 8.5 05/23/2013   HGB 13.7 05/23/2013   HCT 39.2 05/23/2013   PLT 172 05/23/2013   GLUCOSE 94 06/19/2013   CHOL 150 05/24/2013   TRIG 85 05/24/2013   HDL 56 05/24/2013   LDLCALC 77 05/24/2013   ALT 15 05/23/2013   AST 23 05/23/2013   NA 138 06/19/2013   K 4.2 06/19/2013   CL 102 06/19/2013   CREATININE 1.1 06/19/2013   BUN 16 06/19/2013   CO2 29 06/19/2013   TSH 2.55 12/17/2013   INR 1.04 02/01/2011   HGBA1C 5.7* 05/23/2013    ASSESSMENT AND PLAN:  Discussed the following assessment and plan:  Hypertensive heart disease without heart  failure - transition meds today .check for secondary concerns as per pt discussion. - Plan: 5 HIAA, quantitative, urine, 24 hour, Catecholamines, fractionated, urine, 24 hour, Creatinine clearance, urine, 24 hour, Metanephrines, urine, 24 hour, Cortisol, urine, 24 hour, PTH, intact and calcium, Basic metabolic panel  Autoimmune thyroiditis - Plan: 5 HIAA, quantitative, urine, 24 hour, Catecholamines, fractionated, urine, 24 hour, Creatinine clearance, urine, 24 hour, Metanephrines, urine, 24 hour, Cortisol, urine, 24 hour, PTH, intact and calcium, Basic metabolic panel  Medication side effect, sequela - Plan: 5 HIAA, quantitative, urine, 24 hour, Catecholamines, fractionated, urine, 24 hour, Creatinine clearance, urine, 24 hour, Metanephrines, urine, 24 hour, Cortisol, urine, 24 hour, PTH, intact and calcium, Basic metabolic panel  Alternative medicine - chiro told her problem with adrenals nx  supplements for a while feels not causing any se. - Plan: 5 HIAA, quantitative, urine, 24 hour, Catecholamines, fractionated, urine, 24 hour, Creatinine clearance, urine, 24 hour, Metanephrines, urine, 24 hour, Cortisol, urine, 24 hour, PTH, intact and calcium, Basic metabolic panel  History of CVA (cerebrovascular accident) - Plan: 5 HIAA, quantitative, urine, 24 hour, Catecholamines, fractionated, urine, 24 hour, Creatinine clearance, urine, 24 hour, Metanephrines, urine, 24 hour, Cortisol, urine, 24 hour, PTH, intact and calcium, Basic metabolic panel  Influenza vaccination declined - Plan: 5 HIAA, quantitative, urine, 24 hour, Catecholamines, fractionated, urine, 24 hour, Creatinine clearance, urine, 24 hour, Metanephrines, urine, 24 hour, Cortisol, urine, 24 hour, PTH, intact and calcium, Basic metabolic panel  Pulse slow - pt reports  poss from her PVCs see past eventholter cards . - Plan: 5 HIAA, quantitative, urine, 24 hour, Catecholamines, fractionated, urine, 24 hour, Creatinine clearance, urine, 24  hour, Metanephrines, urine, 24 hour, Cortisol, urine, 24 hour, PTH, intact and calcium, Basic metabolic panel Spent a good deal of time going over her medicines and indeed they are difficult to take has to take about 4 times a day and mix and match them. We are going to stop the clonidine first because it is so little and may decrease her pulse rate. The next step would be to decrease the hydralazine that she is on a very small amount twice a day. We will transition over to low-dose ace receptor blocker to avoid potential cough side effect of an ACE inhibitor. With the understanding that we may increase the medicine as we go. She states that she felt dehydrated and dry on a diuretic and is not keen about using this. Not sure what to say about the bones creaking in her blood pressure medicine. Review her records she may be a candidate to do a 24-hour urine for metanephrines catecholamines if not done and urinary free cortisol. Consider PTH if not done she shows me a nontraditional printout about vitamins and minerals and her blood and states that she is order line low on calcium but there is no serum calcium level reported. She'll come back in in about a month but contact us in the meantime with questions as appropriate. Consider EKG  Had event Holter monitor inspring  to make sure she is not getting bradycardia from heart block as she does have a left bundle branch block. Bradycardia-There is no significant bradycardia on Holter monitor. Her minimum heart rate was in fact 52 beats per minute. It is likely that if she was taking her pulse during an episode of ventricular bigeminy or trigeminy that her effective pulse would be low because of the non-transmitted PVC. I explained this in detail. Thankfully, she does not have an inordinate amount of PVCs or PACs. Her PVC burden was 3619. No dangerously slow rhythms. She did have intermittent left bundle branch block and thankfully, her nuclear stress test was  reassuring with no ischemia. Her echocardiogram also was reassuring showing no evidence of cardiomyopathy. I'm comfortable with her continuing with her current medication dosing. She is hopeful that she will be able to come off of some medications in the future. Continue to exercise, lose weight and this may be a possibility. I'm comfortable  with her proceeding with any procedures if necessary.  -Patient advised to return or notify health care team  if symptoms worsen ,persist or new concerns arise.  Patient Instructions  Ask your insurance  ..  about covering bystolic . stip the clonidine  for now  Stay on the amlodipine and coreg   Begin  Low dose arb  Good for heart  Continue to monitor and plan decreasing the hydralazine.  To once a day and then off.       Will review records.  About possibly getting 24 hours urine for cortisol and catecholamines metanephrines. ROV in a months otherwise      Burna Mortimer K. Panosh M.D.  24 hour urine, pth bmp  Orders placed   Still needs thyroid biopsy.  Total visit 30 mins > 50% spent counseling and coordinating care

## 2014-01-14 ENCOUNTER — Encounter: Payer: Self-pay | Admitting: Internal Medicine

## 2014-01-14 ENCOUNTER — Telehealth: Payer: Self-pay | Admitting: Family Medicine

## 2014-01-14 DIAGNOSIS — Z5181 Encounter for therapeutic drug level monitoring: Secondary | ICD-10-CM

## 2014-01-14 DIAGNOSIS — R5383 Other fatigue: Secondary | ICD-10-CM

## 2014-01-14 DIAGNOSIS — I10 Essential (primary) hypertension: Secondary | ICD-10-CM

## 2014-01-14 NOTE — Telephone Encounter (Signed)
-----   Message from Madelin HeadingsWanda K Panosh, MD sent at 01/13/2014 11:09 AM EST ----- Regarding: urine tests and labs  ordered after patient visit  [name]  Please contact patient tell her I reviewed  Record   I placed  orders for 24 hour urine and blood test to check  For adrenal pheo and calcium tests that we had discussed in OV.  Have her do this before her ROV in  A month.  [facility]

## 2014-01-16 NOTE — Telephone Encounter (Signed)
The home/cell number we have on file is not working.  Tried the telephone number of [phone] that was taken from a recent CAN nurse.  Left a message for the pt to return my call.

## 2014-01-18 NOTE — Telephone Encounter (Signed)
LM on [phone] for the pt to return my call

## 2014-01-22 NOTE — Telephone Encounter (Signed)
Need dx code for magnesium and calcium.

## 2014-01-22 NOTE — Telephone Encounter (Signed)
Expand All Collapse All   LM on [phone] for the pt to return my call

## 2014-01-22 NOTE — Telephone Encounter (Signed)
Pt would like to have magnesium lab along w/ calcium test also. Can you add to this or is that already part of lab? pls advise.   Pt states ok to leave message on [phone].  That is a temp number bc they are having issues w/  [employer]  Otherwise labs appt has been made.

## 2014-01-22 NOTE — Telephone Encounter (Signed)
Labile  hypertension and medication monitoring.  And fatigue should work

## 2014-01-22 NOTE — Telephone Encounter (Signed)
Ok to add magnesium test

## 2014-02-14 ENCOUNTER — Other Ambulatory Visit (INDEPENDENT_AMBULATORY_CARE_PROVIDER_SITE_OTHER)

## 2014-02-14 DIAGNOSIS — I10 Essential (primary) hypertension: Secondary | ICD-10-CM

## 2014-02-14 DIAGNOSIS — Z5189 Encounter for other specified aftercare: Secondary | ICD-10-CM

## 2014-02-14 DIAGNOSIS — Z2821 Immunization not carried out because of patient refusal: Secondary | ICD-10-CM

## 2014-02-14 DIAGNOSIS — E063 Autoimmune thyroiditis: Secondary | ICD-10-CM

## 2014-02-14 DIAGNOSIS — T50905S Adverse effect of unspecified drugs, medicaments and biological substances, sequela: Secondary | ICD-10-CM

## 2014-02-14 DIAGNOSIS — Z5181 Encounter for therapeutic drug level monitoring: Secondary | ICD-10-CM

## 2014-02-14 DIAGNOSIS — R5383 Other fatigue: Secondary | ICD-10-CM

## 2014-02-14 DIAGNOSIS — Z8673 Personal history of transient ischemic attack (TIA), and cerebral infarction without residual deficits: Secondary | ICD-10-CM

## 2014-02-14 DIAGNOSIS — R001 Bradycardia, unspecified: Secondary | ICD-10-CM

## 2014-02-14 DIAGNOSIS — I119 Hypertensive heart disease without heart failure: Secondary | ICD-10-CM

## 2014-02-19 LAB — BASIC METABOLIC PANEL
BUN: 13 mg/dL (ref 6–23)
CHLORIDE: 102 meq/L (ref 96–112)
CO2: 29 meq/L (ref 19–32)
Calcium: 8.8 mg/dL (ref 8.4–10.5)
Creatinine, Ser: 1.1 mg/dL (ref 0.4–1.2)
GFR: 67.39 mL/min (ref >60.00)
GLUCOSE: 75 mg/dL (ref 70–99)
POTASSIUM: 3.1 meq/L — AB (ref 3.5–5.1)
Sodium: 138 mEq/L (ref 135–145)

## 2014-02-19 LAB — MAGNESIUM: MAGNESIUM: 2 mg/dL (ref 1.5–2.5)

## 2014-02-20 LAB — CREATININE CLEARANCE, URINE, 24 HOUR
CREAT CLEAR: 170 mL/min — AB (ref 75–115)
Creatinine, 24H Ur: 2221 mg/d — ABNORMAL HIGH (ref 700–1800)
Creatinine, Urine: 63.5 mg/dL
Creatinine: 0.91 mg/dL (ref 0.50–1.10)

## 2014-02-20 LAB — PTH, INTACT AND CALCIUM
Calcium: 9.3 mg/dL (ref 8.4–10.5)
PTH: 37 pg/mL (ref 14–64)

## 2014-02-23 LAB — METANEPHRINES, URINE, 24 HOUR
Metaneph Total, Ur: 740 mcg/24 h (ref 224–832)
Metanephrines, Ur: 105 mcg/24 h (ref 90–315)
Normetanephrine, 24H Ur: 635 mcg/24 h (ref 122–676)

## 2014-02-23 LAB — CATECHOLAMINES, FRACTIONATED, URINE, 24 HOUR
Calculated Total (E+NE): 82 mcg/24 h (ref 26–121)
Creatinine, Urine mg/day-CATEUR: 2 g/(24.h) (ref 0.63–2.50)
Dopamine, 24 hr Urine: 426 mcg/24 h (ref 52–480)
Norepinephrine, 24 hr Ur: 82 mcg/24 h (ref 15–100)
Total Volume - CF 24Hr U: 3500 mL

## 2014-02-23 LAB — 5 HIAA, QUANTITATIVE, URINE, 24 HOUR: 5-HIAA, 24 Hr Urine: 3.8 mg/24 h (ref <=6.0)

## 2014-02-25 LAB — CORTISOL, URINE, 24 HOUR
Cortisol (Ur), Free: 60 mcg/24 h — ABNORMAL HIGH (ref 4.0–50.0)
RESULTS RECEIVED: 2.22 g/(24.h) (ref 0.63–2.50)

## 2014-02-28 ENCOUNTER — Ambulatory Visit: Admitting: Internal Medicine

## 2014-03-22 ENCOUNTER — Telehealth: Payer: Self-pay | Admitting: Internal Medicine

## 2014-03-22 NOTE — Telephone Encounter (Signed)
Pt has appt Monday, but returning misty's call about lab results/  Pt would like someone to call her about results, but if not, pt will keep Monday's appt anyway.

## 2014-03-25 ENCOUNTER — Ambulatory Visit (INDEPENDENT_AMBULATORY_CARE_PROVIDER_SITE_OTHER): Admitting: Internal Medicine

## 2014-03-25 ENCOUNTER — Encounter: Payer: Self-pay | Admitting: Internal Medicine

## 2014-03-25 VITALS — BP 160/98 | HR 77 | Temp 97.8°F | Wt 196.3 lb

## 2014-03-25 DIAGNOSIS — I119 Hypertensive heart disease without heart failure: Secondary | ICD-10-CM

## 2014-03-25 DIAGNOSIS — R82998 Other abnormal findings in urine: Secondary | ICD-10-CM

## 2014-03-25 DIAGNOSIS — I10 Essential (primary) hypertension: Secondary | ICD-10-CM

## 2014-03-25 DIAGNOSIS — E063 Autoimmune thyroiditis: Secondary | ICD-10-CM

## 2014-03-25 DIAGNOSIS — Z5189 Encounter for other specified aftercare: Secondary | ICD-10-CM

## 2014-03-25 DIAGNOSIS — E876 Hypokalemia: Secondary | ICD-10-CM | POA: Insufficient documentation

## 2014-03-25 DIAGNOSIS — R8299 Other abnormal findings in urine: Secondary | ICD-10-CM

## 2014-03-25 HISTORY — DX: Other abnormal findings in urine: R82.998

## 2014-03-25 MED ORDER — LOSARTAN POTASSIUM 50 MG PO TABS: 25.0000 mg | ORAL_TABLET | Freq: Every day | ORAL | Status: DC

## 2014-03-25 NOTE — Patient Instructions (Signed)
Start the cozaar to help potassium and check lab 3 weeks Monitor and fu in 2 months or as needed Plan endocrine referral   Mild elevation of cortisol

## 2014-03-25 NOTE — Progress Notes (Signed)
Chief Complaint  Patient presents with  . 6 week follow up    HPI: [name] 64 y.o.   Fu of severe ht and issues surrounding this .  Bp readings t home 130 128 range this am   Now off the clinidine and hydralazine feels ok but sometimes up. Swelling legs are better .   On norvasc 5  And  Coreg   Taking otc potass pill never got in touch with cma aftetre call  Never tookt he cozaar didn't know it was there at [facility]  Now off the clonidine and hydralazine .  Gets  gass upper abd  And sometimes to left shoulder   Not exercise related   Not chest pain sob .  Sometime back ROS: See pertinent positives and negatives per HPI.  Past Medical History  Diagnosis Date  . Hypertension   . Arthritis   . Depression   . Seasonal allergies   . Stroke 09/2008    ? AMI?   Marland Kitchen Chicken pox   . Diverticulitis     Family History  Problem Relation Age of Onset  . Heart failure Mother   . Heart disease Mother   . Diabetes Mother   . Hypertension Father   . Hypertension Sister   . Hypertension Brother   . Cancer Brother     Prostate  . Hypertension Sister   . Diabetes Maternal Grandmother     History   Social History  . Marital Status: Widowed    Spouse Name: N/A    Number of Children: N/A  . Years of Education: N/A   Social History Main Topics  . Smoking status: Never Smoker   . Smokeless tobacco: None  . Alcohol Use: Yes     Comment: Social Use only  . Drug Use: No  . Sexual Activity: None   Other Topics Concern  . 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 amLODipine (NORVASC) 10 MG  tablet Take 5 mg by mouth daily.   . Ascorbic Acid (VITAMIN C) 1000 MG tablet Take 1,000 mg by mouth every morning.   . B Complex-C (B-COMPLEX WITH VITAMIN C) tablet Take 1 tablet by mouth daily.    . carvedilol (COREG) 6.25 MG tablet Take 3.125 mg by mouth 2 (two) times daily with a meal.  . Cholecalciferol (VITAMIN D PO) Take 1,000 Units by mouth daily.  . Coenzyme Q10 (CO Q-10) 200 MG CAPS Take by mouth.  . LevOCARNitine (L-CARNITINE PO) Take 800 mg by mouth every morning.   Marland Kitchen losartan (COZAAR) 50 MG tablet Take 0.5-1 tablets (25-50 mg total) by mouth daily.  . MULTIPLE VITAMIN PO Take by mouth.  . Multiple Vitamins-Iron (CHLORELLA PO) Take by mouth.  Marland Kitchen NATTOKINASE PO Take 1 tablet by mouth every morning. Helps blood flow  . NON FORMULARY Barley Green  . POTASSIUM PO Take 99 mg by mouth.  . Probiotic Product (PROBIOTIC PO) Take by mouth. Bifido Beadlet 50+  . RA KRILL OIL 500 MG  CAPS Take by mouth.  . SPIRULINA PO Take 1 tablet by mouth every morning.  . [DISCONTINUED] cloNIDine (CATAPRES) 0.1 MG tablet TAKE 0.05 TABLET BY MOUTH twice a day with 6 hours in between each dose  . [DISCONTINUED] hydrALAZINE (APRESOLINE) 50 MG tablet Take 1/4 tab twice daily  . [DISCONTINUED] losartan (COZAAR) 50 MG tablet Take 0.5-1 tablets (25-50 mg total) by mouth daily.    EXAM:  BP 160/98 mmHg  Pulse 77  Temp(Src) 97.8 F (36.6 C) (Oral)  Wt 196 lb 4.8 oz (89.041 kg)  SpO2 97%  Body mass index is 34.78 kg/(m^2).  GENERAL: vitals reviewed and listed above, alert, oriented, appears well hydrated and in no acute distress HEENT: atraumatic, conjunctiva  clear, no obvious abnormalities on inspection of external nose and ears NECK: no obvious masses on inspection palpation  Neg jvd  LUNGS: clear to auscultation bilaterally, no wheezes, rales or rhonchi, good air movement CV: HRRR, no clubbing cyanosis trc  peripheral edema nl cap refill  MS: moves all extremities without noticeable focal   abnormality PSYCH: pleasant and cooperative, no obvious depression or anxiety Lab Results  Component Value Date   WBC 8.5 05/23/2013   HGB 13.7 05/23/2013   HCT 39.2 05/23/2013   PLT 172 05/23/2013   GLUCOSE 75 02/19/2014   CHOL 150 05/24/2013   TRIG 85 05/24/2013   HDL 56 05/24/2013   LDLCALC 77 05/24/2013   ALT 15 05/23/2013   AST 23 05/23/2013   NA 138 02/19/2014   K 3.1* 02/19/2014   CL 102 02/19/2014   CREATININE 1.1 02/19/2014   BUN 13 02/19/2014   CO2 29 02/19/2014   TSH 2.55 12/17/2013   INR 1.04 02/01/2011   HGBA1C 5.7* 05/23/2013   BP Readings from Last 3 Encounters:  03/25/14 160/98  01/11/14 138/90  12/21/13 170/110   Wt Readings from Last 3 Encounters:  03/25/14 196 lb 4.8 oz (89.041 kg)  01/11/14 194 lb (87.998 kg)  12/21/13 191 lb (86.637 kg)    hom er eadings in October wer mostly at goal some in 150 range   Pulse 35? Then 60 70 after change  ASSESSMENT AND PLAN:  Discussed the following assessment and plan:  Hypertensive heart disease without heart failure - never got the cozaar  begin this  now instead of adding extra potassium pills  bmp 3 weeks rob 2 months her readings much better at home still could be improved  Essential hypertension - Plan: Ambulatory referral to Endocrinology  Elevated urinary free cortisol level - mild with low potasssium  endo consult   disc with patient  - Plan: Ambulatory referral to Endocrinology  Alternative medicine  Low serum potassium level - not taking rx med  add cozaar  for now and reassess - Plan: Ambulatory referral to Endocrinology  Autoimmune thyroiditis - Plan: Ambulatory referral to Endocrinology Plan endo referral  Add the cozaar    Bmp in 3 weeks K and cr  rov in 2 months and check bp readings   Med list wasn't correct .  -Patient advised to return or notify health care team  if symptoms worsen ,persist or new concerns arise.  Patient Instructions  Start the cozaar to help potassium and check lab  3 weeks Monitor and fu in 2 months or as needed Plan endocrine referral   Mild elevation of cortisol      Wada K. Panosh M.D.  Pre visit review using our clinic review tool, if applicable. No additional management support  is needed unless otherwise documented below in the visit note.

## 2014-03-26 NOTE — Telephone Encounter (Signed)
Pt seen in office and given results.

## 2014-04-02 ENCOUNTER — Emergency Department (HOSPITAL_COMMUNITY)
Admission: EM | Admit: 2014-04-02 | Discharge: 2014-04-02 | Disposition: A | Discharge status: Home / Self Care | Attending: Emergency Medicine | Admitting: Emergency Medicine

## 2014-04-02 ENCOUNTER — Telehealth: Payer: Self-pay | Admitting: Internal Medicine

## 2014-04-02 ENCOUNTER — Encounter (HOSPITAL_COMMUNITY): Payer: Self-pay | Admitting: Emergency Medicine

## 2014-04-02 DIAGNOSIS — Z8739 Personal history of other diseases of the musculoskeletal system and connective tissue: Secondary | ICD-10-CM | POA: Diagnosis not present

## 2014-04-02 DIAGNOSIS — Z8673 Personal history of transient ischemic attack (TIA), and cerebral infarction without residual deficits: Secondary | ICD-10-CM | POA: Insufficient documentation

## 2014-04-02 DIAGNOSIS — Z880 Allergy status to penicillin: Secondary | ICD-10-CM | POA: Insufficient documentation

## 2014-04-02 DIAGNOSIS — H539 Unspecified visual disturbance: Secondary | ICD-10-CM | POA: Insufficient documentation

## 2014-04-02 DIAGNOSIS — Z8619 Personal history of other infectious and parasitic diseases: Secondary | ICD-10-CM | POA: Diagnosis not present

## 2014-04-02 DIAGNOSIS — I10 Essential (primary) hypertension: Secondary | ICD-10-CM | POA: Insufficient documentation

## 2014-04-02 DIAGNOSIS — Z8709 Personal history of other diseases of the respiratory system: Secondary | ICD-10-CM | POA: Insufficient documentation

## 2014-04-02 DIAGNOSIS — Z8719 Personal history of other diseases of the digestive system: Secondary | ICD-10-CM | POA: Diagnosis not present

## 2014-04-02 DIAGNOSIS — Z79899 Other long term (current) drug therapy: Secondary | ICD-10-CM | POA: Diagnosis not present

## 2014-04-02 DIAGNOSIS — Z8659 Personal history of other mental and behavioral disorders: Secondary | ICD-10-CM | POA: Insufficient documentation

## 2014-04-02 DIAGNOSIS — R200 Anesthesia of skin: Secondary | ICD-10-CM | POA: Insufficient documentation

## 2014-04-02 LAB — CBC WITH DIFFERENTIAL/PLATELET
BASOS PCT: 1 % (ref 0–1)
Basophils Absolute: 0 10*3/uL (ref 0.0–0.1)
EOS ABS: 0.1 10*3/uL (ref 0.0–0.7)
Eosinophils Relative: 1 % (ref 0–5)
HEMATOCRIT: 45.2 % (ref 36.0–46.0)
Hemoglobin: 15.2 g/dL — ABNORMAL HIGH (ref 12.0–15.0)
LYMPHS PCT: 34 % (ref 12–46)
Lymphs Abs: 3 10*3/uL (ref 0.7–4.0)
MCH: 30.9 pg (ref 26.0–34.0)
MCHC: 33.6 g/dL (ref 30.0–36.0)
MCV: 91.9 fL (ref 78.0–100.0)
MONO ABS: 0.6 10*3/uL (ref 0.1–1.0)
MONOS PCT: 7 % (ref 3–12)
Neutro Abs: 5.1 10*3/uL (ref 1.7–7.7)
Neutrophils Relative %: 57 % (ref 43–77)
PLATELETS: 196 10*3/uL (ref 150–400)
RBC: 4.92 MIL/uL (ref 3.87–5.11)
RDW: 13.3 % (ref 11.5–15.5)
WBC: 8.8 10*3/uL (ref 4.0–10.5)

## 2014-04-02 LAB — I-STAT CHEM 8, ED
BUN: 13 mg/dL (ref 6–23)
Calcium, Ion: 1.14 mmol/L (ref 1.13–1.30)
Chloride: 102 mEq/L (ref 96–112)
Creatinine, Ser: 1.1 mg/dL (ref 0.50–1.10)
GLUCOSE: 94 mg/dL (ref 70–99)
HCT: 50 % — ABNORMAL HIGH (ref 36.0–46.0)
Hemoglobin: 17 g/dL — ABNORMAL HIGH (ref 12.0–15.0)
Potassium: 3.3 mmol/L — ABNORMAL LOW (ref 3.5–5.1)
Sodium: 141 mmol/L (ref 135–145)
TCO2: 24 mmol/L (ref 0–100)

## 2014-04-02 LAB — URINALYSIS, ROUTINE W REFLEX MICROSCOPIC
Bilirubin Urine: NEGATIVE
GLUCOSE, UA: NEGATIVE mg/dL
HGB URINE DIPSTICK: NEGATIVE
Ketones, ur: NEGATIVE mg/dL
LEUKOCYTES UA: NEGATIVE
NITRITE: NEGATIVE
PH: 6 (ref 5.0–8.0)
PROTEIN: NEGATIVE mg/dL
Specific Gravity, Urine: 1.008 (ref 1.005–1.030)
Urobilinogen, UA: 0.2 mg/dL (ref 0.0–1.0)

## 2014-04-02 LAB — I-STAT TROPONIN, ED: Troponin i, poc: 0 ng/mL (ref 0.00–0.08)

## 2014-04-02 MED ORDER — CLONIDINE HCL 0.1 MG PO TABS
0.1000 mg | ORAL_TABLET | Freq: Once | ORAL | Status: AC
  Administered 2014-04-02: 0.1 mg via ORAL
  Filled 2014-04-02: qty 1

## 2014-04-02 MED ORDER — LOSARTAN POTASSIUM 50 MG PO TABS: 50.0000 mg | ORAL_TABLET | Freq: Every day | ORAL | Status: DC

## 2014-04-02 NOTE — ED Notes (Signed)
Pt does not want to try for UA at this time

## 2014-04-02 NOTE — ED Provider Notes (Signed)
CSN: [id number]     Arrival date & time 04/02/14  1719 History   First MD Initiated Contact with Patient 04/02/14 1747     Chief Complaint  Patient presents with  . Hypertension     (Consider location/radiation/quality/duration/timing/severity/associated sxs/prior Treatment) Patient is a 64 y.o. female presenting with hypertension. The history is provided by the patient.  Hypertension This is a recurrent problem. Pertinent negatives include no chest pain, no abdominal pain, no headaches and no shortness of breath.   patient has had hypertension. Has been on some medications. Was elevated a week ago at her PCPs office and was started on new medication. She states she's felt slightly strength since. She's had some constipation. She states that her right hand has been a little more tight. She states she had a previous stroke in this hand and a lot of times it will tighten up her blood pressure goes high. No headache. No confusion. States she does have occasional blurred vision. No swelling or legs. She states she has had some urinary frequency.  Past Medical History  Diagnosis Date  . Hypertension   . Arthritis   . Depression   . Seasonal allergies   . Stroke 09/2008    ? AMI?   Marland Kitchen. Chicken pox   . Diverticulitis    Past Surgical History  Procedure Laterality Date  . Wisdom tooth extraction    . Minor breast biopsy  1979   Family History  Problem Relation Age of Onset  . Heart failure Mother   . Heart disease Mother   . Diabetes Mother   . Hypertension Father   . Hypertension Sister   . Hypertension Brother   . Cancer Brother     Prostate  . Hypertension Sister   . Diabetes Maternal Grandmother    History  Substance Use Topics  . Smoking status: Never Smoker   . Smokeless tobacco: Not on file  . Alcohol Use: Yes     Comment: Social Use only   OB History    Gravida Para Term Preterm AB TAB SAB Ectopic Multiple Living   2         2     Review of Systems   Constitutional: Negative for activity change and appetite change.  Eyes: Positive for visual disturbance. Negative for pain.  Respiratory: Negative for chest tightness and shortness of breath.   Cardiovascular: Negative for chest pain and leg swelling.  Gastrointestinal: Negative for nausea, vomiting, abdominal pain and diarrhea.  Genitourinary: Negative for flank pain.  Musculoskeletal: Negative for back pain and neck stiffness.  Skin: Negative for rash.  Neurological: Positive for numbness. Negative for weakness and headaches.  Psychiatric/Behavioral: Negative for behavioral problems.      Allergies  Penicillins and Sulfa antibiotics  Home Medications   Prior to Admission medications   Medication Sig Start Date End Date Taking? Authorizing Provider  amLODipine (NORVASC) 10 MG tablet Take 5 mg by mouth daily.    Yes Historical Provider, MD  Ascorbic Acid (VITAMIN C) 1000 MG tablet Take 1,000 mg by mouth every morning.    Yes Historical Provider, MD  B Complex-C (B-COMPLEX WITH VITAMIN C) tablet Take 1 tablet by mouth daily.     Yes Historical Provider, MD  carvedilol (COREG) 6.25 MG tablet Take 3.125 mg by mouth 2 (two) times daily with a meal. 12/21/13  Yes Madelin HeadingsWanda K Panosh, MD  Cholecalciferol (VITAMIN D PO) Take 1,000 Units by mouth daily.   Yes Historical Provider, MD  Coenzyme Q10 (CO Q-10) 200 MG CAPS Take 1 capsule by mouth daily.    Yes Historical Provider, MD  LevOCARNitine (L-CARNITINE PO) Take 800 mg by mouth every morning.    Yes Historical Provider, MD  Multiple Vitamins-Iron (CHLORELLA PO) Take 1 Bottle by mouth daily.    Yes Historical Provider, MD  NATTOKINASE PO Take 1 tablet by mouth every morning. Helps blood flow   Yes Historical Provider, MD  NON FORMULARY Take 1 Bottle by mouth daily. Barley Green   Yes Historical Provider, MD  POTASSIUM PO Take 1 Bottle by mouth daily.    Yes Historical Provider, MD  Probiotic Product (PROBIOTIC PO) Take 1 tablet by mouth daily.  Bifido Beadlet 50+   Yes Historical Provider, MD  SPIRULINA PO Take 1 Bottle by mouth every morning.    Yes Historical Provider, MD  losartan (COZAAR) 50 MG tablet Take 1 tablet (50 mg total) by mouth daily. 04/02/14   Juliet Rude. Yaqub Arney, MD   BP 117/81 mmHg  Pulse 64  Temp(Src) 98.3 F (36.8 C) (Oral)  Resp 15  SpO2 98% Physical Exam  Constitutional: She is oriented to person, place, and time. She appears well-developed and well-nourished.  HENT:  Head: Normocephalic and atraumatic.  Eyes: EOM are normal. Pupils are equal, round, and reactive to light.  Neck: Normal range of motion. Neck supple.  Cardiovascular: Normal rate, regular rhythm and normal heart sounds.   No murmur heard. Pulmonary/Chest: Effort normal and breath sounds normal. No respiratory distress. She has no wheezes. She has no rales.  Abdominal: Soft. Bowel sounds are normal. She exhibits no distension. There is no tenderness. There is no rebound and no guarding.  Musculoskeletal: Normal range of motion.  Neurological: She is alert and oriented to person, place, and time. No cranial nerve deficit.  Good strength and sensation in bilateral hands.  Skin: Skin is warm and dry.  Psychiatric: She has a normal mood and affect. Her speech is normal.  Nursing note and vitals reviewed.   ED Course  Procedures (including critical care time) Labs Review Labs Reviewed  CBC WITH DIFFERENTIAL - Abnormal; Notable for the following:    Hemoglobin 15.2 (*)    All other components within normal limits  I-STAT CHEM 8, ED - Abnormal; Notable for the following:    Potassium 3.3 (*)    Hemoglobin 17.0 (*)    HCT 50.0 (*)    All other components within normal limits  URINALYSIS, ROUTINE W REFLEX MICROSCOPIC  I-STAT TROPOININ, ED    Imaging Review No results found.   EKG Interpretation   Date/Time:  Tuesday April 02 2014 19:07:25 EST Ventricular Rate:  73 PR Interval:  209 QRS Duration: 117 QT Interval:  444 QTC  Calculation: 489 R Axis:   -40 Text Interpretation:  Sinus rhythm  Premature ventricular complexes Left  atrial enlargement LVH with IVCD, LAD and secondary repol abnrm Anterior  ST elevation, probably due to LVH Borderline prolonged QT interval  Confirmed by Rubin Payor  MD, Harrold Donath [phone]) on 04/02/2014 8:46:46 PM      MDM   Final diagnoses:  Essential hypertension    Patient with hypertension. Recent increase in medication. EKG reassuring. Enzymes negative. No end organ damage. Will increase losartan. Will follow with her PCP.    Juliet Rude. Rubin Payor, MD 04/02/14 2217

## 2014-04-02 NOTE — Telephone Encounter (Signed)
[facility] - Client TELEPHONE ADVICE RECORD [facility] Patient Name: K[facility]hen[facility]LINA [facility] DOB: 05/20/1950 Initial Comment Caller states blood pressure has been high. On medication that does not seem to be helping. 162/108 pulse 74. Vision blurry, chills, cough, lips dry, a lot more urination. Constipated. Nurse Assessment Nurse: Kemper Durielarke, RN, [name] Date/Time Lamount Cohen(Eastern Time): 04/02/2014 4:39:08 PM Confirm and document reason for call. If symptomatic, describe symptoms. ---Caller states blood pressure has been high. On medication that does not seem to be helping. 162/108 pulse 74. Vision blurry, chills, cough, lips dry, a lot more urination. Constipated. Has the patient traveled out of the country within the last 30 days? ---Not Applicable Does the patient require triage? ---Yes Related visit to physician within the last 2 weeks? ---No Does the PT have any chronic conditions? (i.e. diabetes, asthma, etc.) ---Yes List chronic conditions. ---HTN Guidelines Guideline Title Affirmed Question Affirmed Notes High Blood Pressure [1] BP # 160 / 100 AND [2] cardiac or neurologic symptoms (e.g., chest pain, difficulty breathing, unsteady gait, blurred vision) Final Disposition User Go to ED Now Kemper Durielarke, RN, [name]

## 2014-04-02 NOTE — Discharge Instructions (Signed)

## 2014-04-02 NOTE — ED Notes (Signed)
Pt c/o hypertension, states she had blurred vision earlier today but not currently. BP is 163/116. Denies HA.

## 2014-04-16 ENCOUNTER — Other Ambulatory Visit (INDEPENDENT_AMBULATORY_CARE_PROVIDER_SITE_OTHER)

## 2014-04-16 DIAGNOSIS — I10 Essential (primary) hypertension: Secondary | ICD-10-CM | POA: Diagnosis not present

## 2014-04-16 LAB — BASIC METABOLIC PANEL
BUN: 14 mg/dL (ref 6–23)
CO2: 30 mEq/L (ref 19–32)
CREATININE: 0.99 mg/dL (ref 0.40–1.20)
Calcium: 9 mg/dL (ref 8.4–10.5)
Chloride: 104 mEq/L (ref 96–112)
GFR: 72.88 mL/min (ref >60.00)
Glucose, Bld: 95 mg/dL (ref 70–99)
Potassium: 3.8 mEq/L (ref 3.5–5.1)
Sodium: 138 mEq/L (ref 135–145)

## 2014-04-24 ENCOUNTER — Telehealth: Payer: Self-pay | Admitting: Family Medicine

## 2014-04-24 ENCOUNTER — Other Ambulatory Visit: Payer: Self-pay | Admitting: Internal Medicine

## 2014-04-24 ENCOUNTER — Other Ambulatory Visit: Payer: Self-pay | Admitting: Family Medicine

## 2014-04-24 DIAGNOSIS — R001 Bradycardia, unspecified: Secondary | ICD-10-CM

## 2014-04-24 DIAGNOSIS — I447 Left bundle-branch block, unspecified: Secondary | ICD-10-CM

## 2014-04-24 DIAGNOSIS — I10 Essential (primary) hypertension: Secondary | ICD-10-CM

## 2014-04-24 DIAGNOSIS — I493 Ventricular premature depolarization: Secondary | ICD-10-CM

## 2014-04-24 NOTE — Telephone Encounter (Signed)
Sent to the pharmacy by e-scribe. 

## 2014-04-24 NOTE — Telephone Encounter (Signed)
Pt informed of her test results by telephone.  She informed me that her blood pressure has been high and "all over the place."  She is currently taking a whole tab (increased in the hospital) of losartan (COZAAR) 50 MG tablet, amLODipine (NORVASC) 10 MG tablet and carvedilol (COREG) 6.25 MG tablet.  Pt notified to keep her follow up appt next month to discuss bp and also since she was in the ED on 04/02/14.    Pt thinks her high bp is due to fillings in her mouth that contain mercury.  She wanted to come off medication entirely.  Advised the pt that she not stop any medications because her bp may spike.  Pt understands and agreed to not stop medication.  She is having a tooth extraction on 05/07/14 with Dr[facility] Anselm [facility]ancoastJames [place] [facility]  She thinks her bp will return to normal at that time.

## 2014-04-24 NOTE — Telephone Encounter (Signed)
Increase the coreg to 12.5 mg  bid   If remains  145 and above  Until i see her

## 2014-04-25 NOTE — Telephone Encounter (Signed)
Attempted to call pt; left a message for return call.  

## 2014-04-25 NOTE — Telephone Encounter (Signed)
Left a message for return call.  

## 2014-04-26 DIAGNOSIS — R001 Bradycardia, unspecified: Secondary | ICD-10-CM | POA: Insufficient documentation

## 2014-04-26 NOTE — Telephone Encounter (Signed)
Please see cardiology   As soon as possible .   If HR stays in the 40s  Get emergency help ... Because of the heart rate issue  DON'T  increase the coreg and instead  Increase the losartan to 100 mg per day .    And stay on the 3.125 coreg. Bid

## 2014-04-26 NOTE — Telephone Encounter (Signed)
Spoke to the pt and informed her to increase the carvedilol to 12.5 mg.  Pt then notified me that she has only been taking half on 6.25.  Instructed her to take a whole tab.  BPs in the 180s and 190s.  She stated her hr has been down in the 30s and 40s sometimes when she wakes in the morning.  Asked if she wanted a cardiology referal.  She would like to go back and see Dr. Caprice RedHockrein.  Referral placed in the system.  Will send to Dr. Fabian SharpPanosh for review.

## 2014-04-26 NOTE — Telephone Encounter (Signed)
Pt notifed but will increase losartan to 75mg  qhs and maintain coreg as in the past.  Taking losartan at night due to headaches and fatigue.  Will recheck with the pt on Monday.

## 2014-04-27 ENCOUNTER — Other Ambulatory Visit: Payer: Self-pay | Admitting: Internal Medicine

## 2014-04-29 NOTE — Telephone Encounter (Signed)
Sent to the pharmacy by e-scribe. 

## 2014-05-02 NOTE — Telephone Encounter (Signed)
LM for the pt to return my call. 

## 2014-05-14 ENCOUNTER — Encounter: Payer: Self-pay | Admitting: Internal Medicine

## 2014-05-14 ENCOUNTER — Ambulatory Visit (INDEPENDENT_AMBULATORY_CARE_PROVIDER_SITE_OTHER): Admitting: Internal Medicine

## 2014-05-14 VITALS — BP 142/102 | HR 83 | Temp 98.5°F | Resp 12 | Ht 63.0 in | Wt 193.0 lb

## 2014-05-14 DIAGNOSIS — R82998 Other abnormal findings in urine: Secondary | ICD-10-CM

## 2014-05-14 DIAGNOSIS — E063 Autoimmune thyroiditis: Secondary | ICD-10-CM | POA: Diagnosis not present

## 2014-05-14 DIAGNOSIS — R8299 Other abnormal findings in urine: Secondary | ICD-10-CM

## 2014-05-14 DIAGNOSIS — E041 Nontoxic single thyroid nodule: Secondary | ICD-10-CM | POA: Diagnosis not present

## 2014-05-14 NOTE — Patient Instructions (Signed)
Please collect a 24h urine for a recheck of cortisol. Stay off the supplements in the day of the collection. Patient information (Up-to-Date): Collection of a 24-hour urine specimen  - You should collect every drop of urine during each 24-hour period. It does not matter how much or little urine is passed each time, as long as every drop is collected. - Begin the urine collection in the morning after you wake up, after you have emptied your bladder for the first time. - Urinate (empty the bladder) for the first time and flush it down the toilet. Note the exact time (eg, 6:15 AM). You will begin the urine collection at this time. - Collect every drop of urine during the day and night in an empty collection bottle. Store the bottle at room temperature or in the refrigerator. - If you need to have a bowel movement, any urine passed with the bowel movement should be collected. Try not to include feces with the urine collection. If feces does get mixed in, do not try to remove the feces from the urine collection bottle. - Finish by collecting the first urine passed the next morning, adding it to the collection bottle. This should be within ten minutes before or after the time of the first morning void on the first day (which was flushed). In this example, you would try to void between 6:05 and 6:25 on the second day. - If you need to urinate one hour before the final collection time, drink a full glass of water so that you can void again at the appropriate time. If you have to urinate 20 minutes before, try to hold the urine until the proper time. - Please note the exact time of the final collection, even if it is not the same time as when collection began on day 1. - The bottle(s) may be kept at room temperature for a day or two, but should be kept cool or refrigerated for longer periods of time.  Please let me know in ~6 months if you like me to order the new thyroid ultrasound.

## 2014-05-14 NOTE — Telephone Encounter (Signed)
Tried reaching the pt.  Unable to leave a message.  Will try again at a later time.

## 2014-05-14 NOTE — Progress Notes (Signed)
Patient ID: [name], female   DOB: 04/21/1950, 64 y.o.   MRN: [id number]   HPI  [name] is a 64 y.o.-year-old female, referred by her PCP, Dr. Fabian SharpPanosh, in consultation for elevated urinary cortisol.  Pt describes that she has had fluctuating BP episodes for year.  Pt presented to the ED for HTN: 168/116 on 04/02/2014. She believes this may have been related to an infected tooth - ths has now been extracted 2 weeks ago.  She has been investigated for possible endocrine causes for HTN by PCP recently and a high urinary cortisol was found as the only abnormality:  Component     Latest Ref Rng 02/19/2014  Total Volume - CF 24Hr U      3500  Epinephrine, 24 hr Urine      REPORT  Norepinephrine, 24 hr Ur     15 - 100 mcg/24 h 82  Calculated Total (E+NE)     26 - 121 mcg/24 h 82  Dopamine, 24 hr Urine     52 - 480 mcg/24 h 426  Creatinine, Urine mg/day-CATEUR     0.63 - 2.50 g/24 h 2.00  Metanephrines, Ur     90 - 315 mcg/24 h 105  Normetanephrine, 24H Ur     122 - 676 mcg/24 h 635  Metaneph Total, Ur     224 - 832 mcg/24 h 740  Cortisol (Ur), Free     4.0 - 50.0 mcg/24 h 60.0 (H)  Results received     0.63 - 2.50 g/24 h 2.22  PTH     14 - 64 pg/mL 37  Calcium     8.4 - 10.5 mg/dL 9.3  5-HIAA, 24 Hr Urine     <=6.0 mg/24 h 3.8   Of note, a CT scan of the abdomen did not show an adrenal nodule in 05/23/2013.  No h/o DM2. No proximal mm weakness. No large, purple, striae.  No steroid creams, injections, po courses.   Pt describes: - weight gain - fatigue - cold intolerance - depression - constipation - dry skin - hair loss  Pt also has a h/o Hashimoto's thyroiditis with previously elevated TSH. I reviewed pt's thyroid tests: Lab Results  Component Value Date   TSH 2.55 12/17/2013   TSH 2.07 06/19/2013   TSH 6.914 05/23/2013   TSH 8.145 09/25/2008   FREET4 1.03 12/17/2013   FREET4 0.95 06/19/2013    Component     Latest Ref Rng  06/19/2013  Thyroperoxidase Ab SerPl-aCnc     <35.0 IU/mL 969.0 (H)  Thyroglobulin Ab     <40.0 U/mL <20.0   She also has a R thyroid nodule per thyroid U/S (07/06/2013): 1.7 x 1.6 x 1.7 cm. No previous Bx.  Pt denies feeling nodules in neck, hoarseness, dysphagia/odynophagia, SOB with lying down.  ROS: Constitutional: + both weight gain/loss, + fatigue, no subjective hyperthermia/hypothermia Eyes: + blurry vision, no xerophthalmia ENT: no sore throat, no nodules palpated in throat, no dysphagia/odynophagia, no hoarseness Cardiovascular: no CP/+ SOB/+ palpitations/+ leg swelling Respiratory: + cough/+ SOB Gastrointestinal: no N/V/D/+ C Musculoskeletal: no muscle/+ joint aches Skin: no rashes, + hair loss, + excessive hair growth Neurological: no tremors/numbness/tingling/dizziness Psychiatric: no depression/anxiety  Past Medical History  Diagnosis Date  . Hypertension   . Arthritis   . Depression   . Seasonal allergies   . Stroke 09/2008    ? AMI?   Marland Kitchen. Chicken pox   . Diverticulitis    Past Surgical History  Procedure Laterality Date  . Wisdom tooth extraction    . Minor breast biopsy  1979   History   Social History  . Marital Status: Widowed    Spouse Name: N/A  . Number of Children: 2   Occupational History  . consultatnt   Social History Main Topics  . Smoking status: Never Smoker   . Smokeless tobacco: Not on file  . Alcohol Use: Yes     Comment: Social Use only  . 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 amLODipine (NORVASC) 10 MG tablet TAKE 1 TABLET  BY MOUTH DAILY 30 tablet 1  . Ascorbic Acid (VITAMIN C) 1000 MG tablet Take 1,000 mg by mouth every morning.     . B Complex-C (B-COMPLEX WITH VITAMIN C) tablet Take 1 tablet by mouth daily.      . carvedilol (COREG) 6.25 MG tablet Take 3.125 mg by mouth 2 (two) times daily with a meal.    . Cholecalciferol (VITAMIN D PO) Take 1,000 Units by mouth daily.    . Coenzyme Q10 (CO Q-10) 200 MG CAPS Take 1 capsule by mouth daily.     . LevOCARNitine (L-CARNITINE PO) Take 800 mg by mouth every morning.     Marland Kitchen losartan (COZAAR) 50 MG tablet Take 1 tablet (50 mg total) by mouth daily. 30 tablet 3  . Multiple Vitamins-Iron (CHLORELLA PO) Take 1 Bottle by mouth daily.     Marland Kitchen NATTOKINASE PO Take 1 tablet by mouth every morning. Helps blood flow    . NON FORMULARY Take 1 Bottle by mouth daily. Barley Green    . POTASSIUM PO Take 1 Bottle by mouth daily.     . Probiotic Product (PROBIOTIC PO) Take 1 tablet by mouth daily. Bifido Beadlet 50+    . SPIRULINA PO Take 1 Bottle by mouth every morning.      No current facility-administered medications on file prior to visit.   Allergies  Allergen Reactions  . Penicillins Swelling  . Sulfa Antibiotics Itching   Family History  Problem Relation Age of Onset  . Heart failure Mother   . Heart disease Mother   . Diabetes Mother   . Hypertension Father   . Hypertension Sister   . Hypertension Brother   . Cancer Brother     Prostate  . Hypertension Sister   . Diabetes Maternal Grandmother    PE: BP 142/102 mmHg  Pulse 83  Temp(Src) 98.5 F (36.9 C) (Oral)  Resp 12  Ht  (1.6 m)  Wt 193 lb (87.544 kg)  BMI 34.20 kg/m2  SpO2 97% Wt Readings from Last 3 Encounters:  05/14/14 193 lb (87.544 kg)  03/25/14 196 lb 4.8 oz (89.041 kg)  01/11/14 194 lb (87.998 kg)   Constitutional: overweight, in NAD Eyes: PERRLA, EOMI, no exophthalmos ENT: moist mucous membranes, no thyromegaly, no nodules palpated in neck; no cervical  lymphadenopathy Cardiovascular: RRR, No MRG Respiratory: CTA B Gastrointestinal: abdomen soft, NT, ND, BS+  Musculoskeletal: no deformities, strength intact in all 4 Skin: moist, warm, no rashes Neurological: no tremor with outstretched hands, DTR normal in all 4  ASSESSMENT: 1. Elevated urinary cortisol  2. Hashimoto's thyroiditis  3. R thyroid nodule  PLAN:  1. Elevated urinary cortisol - cortisol minimally elevated at check in 02/2014 - will need to repeat collection >> explained how to do this correctly. Advised her not to drink too much water when collecting the urine - we discussed that this may be 2/2 her supplements (she is taking many...) >> advised her to skip the supplements for the day of the collection - if abnormal again >> will advise to skip supplements for 1 mo and then recheck.  - we discussed about the possibility of Cushing's sd. And ds. Explained difference between primary and secondary Cushing sd. Also, discussed presentation of the above. - will schedule her to come back if labs abnormal again  2. Hashimoto's ds. - pt with euthyroid Hashimoto's disease - explained how the dx was made - discussed her high TPO Ab levels  - explained evolution of the ds towards requiring LT4 - for now, will continue to follow with PCP, with TFTs checked once a year  3. R thyroid nodule - She does not appear to have a goiter, thyroid nodules, or neck compression symptoms - discussed the results of the U/S from 2015 - advised her to have a thyroid U/S repeated this year >> she will decide whether she wants to have this done by PCP or by me >> will let me know if she wants me to order it >> we will then need to meet to discuss the results and see if we need a Bx  Return if symptoms worsen or fail to improve.  - time spent with the patient: 1 hour, of which >50% was spent in obtaining information about her 3 conditions above, reviewing her previous labs, evaluations, and counseling  her about her conditions (please see the discussed topics above), and developing a plan to further investigate it. She had a number of questions which I addressed.  Component     Latest Ref Rng 02/19/2014 05/21/2014  Creatinine, Urine      63.5 59.7  Creatinine, 24H Ur     700 - 1800 mg/day 2221 (H) 1612  Cortisol (Ur), Free     4.0 - 50.0 mcg/24 h 60.0 (H) 36.9  Results received     0.63 - 2.50 g/24 h 2.22 1.69  The repeat UFC is normal. At last check, her UCr was also high, and this can be associated with increased 24h urine cortisol. At this check, the UCr is normal, as is the UFC.

## 2014-05-17 NOTE — Telephone Encounter (Signed)
LM for the pt to return my call.  Will now close the note.  Have tried multiple times to reach the pt.

## 2014-05-21 ENCOUNTER — Other Ambulatory Visit

## 2014-05-22 LAB — CREATININE, URINE, 24 HOUR
Creatinine, 24H Ur: 1612 mg/d (ref 700–1800)
Creatinine, Urine: 59.7 mg/dL

## 2014-05-24 ENCOUNTER — Ambulatory Visit (INDEPENDENT_AMBULATORY_CARE_PROVIDER_SITE_OTHER): Admitting: Internal Medicine

## 2014-05-24 ENCOUNTER — Encounter: Payer: Self-pay | Admitting: Internal Medicine

## 2014-05-24 VITALS — BP 154/104 | Temp 98.3°F | Ht 62.5 in | Wt 192.4 lb

## 2014-05-24 DIAGNOSIS — R0683 Snoring: Secondary | ICD-10-CM

## 2014-05-24 DIAGNOSIS — D582 Other hemoglobinopathies: Secondary | ICD-10-CM | POA: Insufficient documentation

## 2014-05-24 DIAGNOSIS — I119 Hypertensive heart disease without heart failure: Secondary | ICD-10-CM

## 2014-05-24 DIAGNOSIS — Z5189 Encounter for other specified aftercare: Secondary | ICD-10-CM | POA: Diagnosis not present

## 2014-05-24 DIAGNOSIS — E063 Autoimmune thyroiditis: Secondary | ICD-10-CM | POA: Diagnosis not present

## 2014-05-24 LAB — CORTISOL, URINE, 24 HOUR
CORTISOL (UR), FREE: 36.9 ug/(24.h) (ref 4.0–50.0)
RESULTS RECEIVED: 1.69 g/(24.h) (ref 0.63–2.50)

## 2014-05-24 MED ORDER — LOSARTAN POTASSIUM 100 MG PO TABS: 100.0000 mg | ORAL_TABLET | Freq: Every day | ORAL | Status: DC

## 2014-05-24 NOTE — Progress Notes (Signed)
Pre visit review using our clinic review tool, if applicable. No additional management support is needed unless otherwise documented below in the visit note.  Chief Complaint  Patient presents with  . Follow-up    HPI: [name] here for fu of hard to control ht with sp sca and abnormal cortisol levels  hashimotos and altnerative med supplementation and skeptism of  Current medicical knowledge base  Feels that mercury in fillings causing som sx  Taking 25 losartan at night no problems. BP.  Marland Kitchen  this am was 135/92 157/91 117/82 131/81 Dentist  Had tooth moved  Dental work may have helped. With abscess.  Antibiotic  .Marland KitchenMarland Kitchen  Mercury .   Got it chelated .  Chporella  Saw dr Reece Agar and urinary cortisol repeatd no results yet  Hs hashimotos  Euthyroid as discussed  Can follow pcp Less swelling  Snores at times awake her self ocass doesn't klnow  If has osa  PLAN:  1. Elevated urinary cortisol - cortisol minimally elevated at check in 02/2014 - will need to repeat collection >> explained how to do this correctly. Advised her not to drink too much water when collecting the urine - we discussed that this may be 2/2 her supplements (she is taking many...) >> advised her to skip the supplements for the day of the collection - if abnormal again >> will advise to skip supplements for 1 mo and then recheck.  - we discussed about the possibility of Cushing's sd. And ds. Explained difference between primary and secondary Cushing sd. Also, discussed presentation of the above. - will schedule her to come back if labs abnormal again  2. Hashimoto's ds. - pt with euthyroid Hashimoto's disease - explained how the dx was made - discussed her high TPO Ab levels  - explained evolution of the ds towards requiring LT4 - for now, will continue to follow with PCP, with TFTs checked once a year  3. R thyroid nodule - She does not appear to have a goiter, thyroid nodules, or neck compression symptoms -  discussed the results of the U/S from 2015 - advised her to have a thyroid U/S repeated this year >> she will decide whether she wants to have this done by PCP or by me >> will let me know if she wants me to order it >> we will then need to meet to discuss the results and see if we need a Bx  ROS: See pertinent positives and negatives per HPI. No cp sob currently   Past Medical History  Diagnosis Date  . Hypertension   . Arthritis   . Depression   . Seasonal allergies   . Stroke 09/2008    ? AMI?   Marland Kitchen Chicken pox   . Diverticulitis     Family History  Problem Relation Age of Onset  . Heart failure Mother   . Heart disease Mother   . Diabetes Mother   . Hypertension Father   . Hypertension Sister   . Hypertension Brother   . Cancer Brother     Prostate  . Hypertension Sister   . Diabetes Maternal Grandmother     History   Social History  . Marital Status: Widowed    Spouse Name: N/A  . Number of Children: N/A  . Years of Education: N/A   Social History Main Topics  . Smoking status: Never Smoker   . Smokeless tobacco: Not on file  . Alcohol Use: Yes     Comment:  Social Use only  . Drug Use: No  . Sexual Activity: Not on file   Other Topics Concern  . 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 amLODipine (NORVASC) 10 MG tablet TAKE 1 TABLET BY MOUTH DAILY  . Ascorbic Acid (VITAMIN C) 1000 MG tablet Take 1,000 mg by mouth every morning.   . B Complex-C (B-COMPLEX WITH VITAMIN C) tablet Take 1 tablet by mouth daily.    . carvedilol (COREG) 6.25 MG tablet Take 3.125 mg by mouth 2 (two)  times daily with a meal.  . Cholecalciferol (VITAMIN D PO) Take 1,000 Units by mouth daily.  . Coenzyme Q10 (CO Q-10) 200 MG CAPS Take 1 capsule by mouth daily.   . LevOCARNitine (L-CARNITINE PO) Take 800 mg by mouth every morning.   . MULTIPLE VITAMIN PO Take by mouth.  Marland Kitchen NATTOKINASE PO Take 1 tablet by mouth every morning. Helps blood flow  . NON FORMULARY Barley Green:  Takes 1 tsp daily  . NON FORMULARY Chlorella:  Takes 15 capsules daily  . Probiotic Product (PROBIOTIC PO) Take 1 tablet by mouth daily. Bifido Beadlet 50+  . SPIRULINA PO Take 1 Bottle by mouth every morning.   . [DISCONTINUED] losartan (COZAAR) 50 MG tablet Take 1 tablet (50 mg total) by mouth daily.  Marland Kitchen losartan (COZAAR) 100 MG tablet Take 1 tablet (100 mg total) by mouth daily.  . [DISCONTINUED] Multiple Vitamins-Iron (CHLORELLA PO) Take 1 Bottle by mouth daily.   . [DISCONTINUED] POTASSIUM PO Take 1 Bottle by mouth daily.     EXAM:  BP 154/104 mmHg  Temp(Src) 98.3 F (36.8 C) (Oral)  Ht 5' 2.5" (1.588 m)  Wt 192 lb 6.4 oz (87.272 kg)  BMI 34.61 kg/m2  Body mass index is 34.61 kg/(m^2). bp confirmed  Monitor checked  152/96 150/98 GENERAL: vitals reviewed and listed above, alert, oriented, appears well hydrated and in no acute distress HEENT: atraumatic, conjunctiva  clear, no obvious abnormalities on inspection of external nose and ears  NECK: no obvious masses on inspection palpation  LUNGS: clear to auscultation bilaterally, no wheezes, rales or rhonchi, good air movement CV: HRRR, no clubbing cyanosis o trace peripheral edema nl cap refill  MS: moves all extremities without noticeable focal  abnormality PSYCH: pleasant and cooperative, no obvious depression or anxiety Lab Results  Component Value Date   WBC 8.8 04/02/2014   HGB 17.0* 04/02/2014   HCT 50.0* 04/02/2014   PLT 196 04/02/2014   GLUCOSE 95 04/16/2014   CHOL 150 05/24/2013   TRIG 85 05/24/2013   HDL 56 05/24/2013   LDLCALC 77 05/24/2013     ALT 15 05/23/2013   AST 23 05/23/2013   NA 138 04/16/2014   K 3.8 04/16/2014   CL 104 04/16/2014   CREATININE 0.99 04/16/2014   BUN 14 04/16/2014   CO2 30 04/16/2014   TSH 2.55 12/17/2013   INR 1.04 02/01/2011   HGBA1C 5.7* 05/23/2013   BP Readings from Last 3 Encounters:  05/24/14 154/104  05/14/14 142/102  04/02/14 112/77   Wt Readings from Last 3 Encounters:  05/24/14 192 lb 6.4 oz (87.272 kg)  05/14/14 193 lb (87.544 kg)  03/25/14 196 lb 4.8 oz (89.041 kg)     ASSESSMENT AND PLAN:  Discussed the following assessment and plan:  Autoimmune thyroiditis  Hypertensive heart disease without heart failure  Snoring - consider  osa  Alternative medicine  Elevated hemoglobin disc about multiple concerns issues  Had done well on bystolic but insurance wont cover  Inc losartan 100 .  consider at next visit about sleep referral  -Patient advised to return or notify health care team  if symptoms worsen ,persist or new concerns arise. Total visit 30mins > 50% spent counseling and coordinating care   Patient Instructions  Increase to 100 mg cozaar per day .  every thing else the same .   For now Consider  Assessment for sleep apnea  Plan recheck : Cbc  diff  and  Liver panel  And bmp in about 6-8 weeks   .  And then ROV.       Neta MendsWanda K. Chaye Misch M.D.

## 2014-05-24 NOTE — Patient Instructions (Signed)
Increase to 100 mg cozaar per day .  every thing else the same .   For now Consider  Assessment for sleep apnea  Plan recheck : Cbc  diff  and  Liver panel  And bmp in about 6-8 weeks   .  And then ROV.

## 2014-05-28 ENCOUNTER — Encounter: Payer: Self-pay | Admitting: *Deleted

## 2014-06-06 ENCOUNTER — Institutional Professional Consult (permissible substitution): Admitting: Cardiology

## 2014-06-18 ENCOUNTER — Encounter: Payer: Self-pay | Admitting: Cardiology

## 2014-06-18 ENCOUNTER — Ambulatory Visit (INDEPENDENT_AMBULATORY_CARE_PROVIDER_SITE_OTHER): Admitting: Cardiology

## 2014-06-18 VITALS — BP 198/112 | HR 74 | Ht 64.0 in | Wt 191.1 lb

## 2014-06-18 DIAGNOSIS — R0683 Snoring: Secondary | ICD-10-CM

## 2014-06-18 MED ORDER — NEBIVOLOL HCL 2.5 MG PO TABS: 2.5000 mg | ORAL_TABLET | Freq: Every day | ORAL | Status: DC

## 2014-06-18 MED ORDER — AMLODIPINE BESYLATE 5 MG PO TABS: 5.0000 mg | ORAL_TABLET | Freq: Every day | ORAL | Status: DC

## 2014-06-18 NOTE — Patient Instructions (Addendum)
Bystolic 2.5mg  Daily  STOP Carvedilol   Your physician has recommended that you have a sleep study. This test records several body functions during sleep, including: brain activity, eye movement, oxygen and carbon dioxide blood levels, heart rate and rhythm, breathing rate and rhythm, the flow of air through your mouth and nose, snoring, body muscle movements, and chest and belly movement.  Your physician recommends that you schedule a follow-up appointment in 2 months with Dr. Antoine PocheHochrein

## 2014-06-18 NOTE — Progress Notes (Signed)
Cardiology Office Note   Date:  06/18/2014   ID:  [name], DOB 10-24-1950, MRN [id number]  PCP:  Lorretta Harp, MD  Cardiologist:   Rollene Rotunda, MD   Chief Complaint  Patient presents with  . Hypertension      History of Present Illness: [name] is a 64 y.o. female who presents for for evaluation of difficult to control hypertension. I saw her several years ago. She also has frequent ventricular ectopy. She most recently saw one of my partners but is now coming back here. She was in the hospital last year and saw him in follow-up after that. I did review some records including a stress perfusion study in April of last year which demonstrated a fixed anterior defect probably breast attenuation. She had a monitor following that demonstrates that 25% of the time she has PACs or PVCs. She's had very difficult to control hypertension. She's been sensitive to medications. She is weaning herself down off of Norvasc. She doesn't like the Cozaar that she's taking because it doesn't make her feel good. She has multiple somatic complaints although she still walks.  She does have snoring and daytime somnolence. She just hasn't felt well since it is in the hospital last year. Is frustrated by it difficult to control blood pressure. She says at home is in the 130s over 90s typically.  She does not describe PND or orthopnea. She does feel the palpitations but doesn't think it quite as bad as they used to be and she's not have presyncope or syncope.    Past Medical History  Diagnosis Date  . Hypertension   . Arthritis   . Depression   . Seasonal allergies   . Stroke 09/2008  . Chicken pox   . Diverticulitis     Past Surgical History  Procedure Laterality Date  . Wisdom tooth extraction    . Minor breast biopsy  1979     Current Outpatient Prescriptions  Medication Sig Dispense Refill  . amLODipine (NORVASC) 10 MG tablet TAKE 1 TABLET BY MOUTH DAILY 30  tablet 1  . Ascorbic Acid (VITAMIN C) 1000 MG tablet Take 1,000 mg by mouth every morning.     . B Complex-C (B-COMPLEX WITH VITAMIN C) tablet Take 1 tablet by mouth daily.      . carvedilol (COREG) 6.25 MG tablet Take 3.125 mg by mouth 2 (two) times daily with a meal.    . Cholecalciferol (VITAMIN D PO) Take 1,000 Units by mouth daily.    . Coenzyme Q10 (CO Q-10) 200 MG CAPS Take 1 capsule by mouth daily.     . Iodine, Kelp, (KELP PO) Take by mouth. Take 1 teaspoon by mouth daily    . LevOCARNitine (L-CARNITINE PO) Take 800 mg by mouth every morning.     Marland Kitchen losartan (COZAAR) 100 MG tablet Take 1 tablet (100 mg total) by mouth daily. 90 tablet 3  . MULTIPLE VITAMIN PO Take by mouth.    Marland Kitchen NATTOKINASE PO Take 1 tablet by mouth every morning. Helps blood flow    . NON FORMULARY Barley Green:  Takes 1 tsp daily    . NON FORMULARY Chlorella:  Takes 15 capsules daily    . Probiotic Product (PROBIOTIC PO) Take 1 tablet by mouth daily. Bifido Beadlet 50+    . SPIRULINA PO Take 1 teaspoon by mouth daily     No current facility-administered medications for this visit.    Allergies:   Penicillins and Sulfa  antibiotics    ROS:  Please see the history of present illness.   Otherwise, review of systems are positive for none.   All other systems are reviewed and negative.    PHYSICAL EXAM: VS:  BP 198/112 mmHg  Pulse 74  Ht 5\' 4"  (1.626 m)  Wt 191 lb 1.6 oz (86.682 kg)  BMI 32.79 kg/m2 , BMI Body mass index is 32.79 kg/(m^2). GENERAL:  Well appearing HEENT:  Pupils equal round and reactive, fundi not visualized, oral mucosa unremarkable NECK:  No jugular venous distention, waveform within normal limits, carotid upstroke brisk and symmetric, no bruits, no thyromegaly LYMPHATICS:  No cervical, inguinal adenopathy LUNGS:  Clear to auscultation bilaterally BACK:  No CVA tenderness CHEST:  Unremarkable HEART:  PMI not displaced or sustained,S1 and S2 within normal limits, no S3, no S4, no clicks, no  rubs, no murmurs ABD:  Flat, positive bowel sounds normal in frequency in pitch, no bruits, no rebound, no guarding, no midline pulsatile mass, no hepatomegaly, no splenomegaly EXT:  2 plus pulses throughout, no edema, no cyanosis no clubbing SKIN:  No rashes no nodules NEURO:  Cranial nerves II through XII grossly intact, motor grossly intact throughout PSYCH:  Cognitively intact, oriented to person place and time    EKG:  EKG is not ordered today. The ekg ordered 04/02/14. demonstrates sinus rhythm, rate 73, left axis deviation, minimal voltage criteria for left ventricular hypertrophy, intervals within normal limits, premature ventricular actions   Recent Labs: 12/17/2013: TSH 2.55 02/19/2014: Magnesium 2.0 04/02/2014: Hemoglobin 17.0*; Platelets 196 04/16/2014: BUN 14; Creatinine 0.99; Potassium 3.8; Sodium 138    Lipid Panel    Component Value Date/Time   CHOL 150 05/24/2013 0317   TRIG 85 05/24/2013 0317   HDL 56 05/24/2013 0317   CHOLHDL 2.7 05/24/2013 0317   VLDL 17 05/24/2013 0317   LDLCALC 77 05/24/2013 0317      Wt Readings from Last 3 Encounters:  06/18/14 191 lb 1.6 oz (86.682 kg)  05/24/14 192 lb 6.4 oz (87.272 kg)  05/14/14 193 lb (87.544 kg)      Other studies Reviewed: Additional studies/ records that were reviewed today include: Lexiscan Myoview, holter. Review of the above records demonstrates:  Please see elsewhere in the note.     ASSESSMENT AND PLAN:  HTN:  She has difficult to control hypertension compounded by sensitivity to medications. She did well in the past with Bystolic.  This was likely stopped because of the fact that it wasn't covered. I will write for this again at a low dose and stop her carvedilol. I would probably add spironolactone as she has not been on this previously. She also has low potassium. I'm going to order a sleep study because of snoring, daytime somnolence and her difficult to control hypertension.  We discussed there appeared  lifestyle changes to include weight loss and exercise as well.  PVCs:  She has a lot of ectopy including PVCs and PACs but she's not particularly symptomatic with this. This will be managed in the context of treating her hypertension.   Current medicines are reviewed at length with the patient today.  The patient does not have concerns regarding medicines.  The following changes have been made:  As above  Labs/ tests ordered today include: Sleep study     Disposition:   FU with me in two months    Signed, Rollene RotundaJames Adreena Willits, MD  06/18/2014 3:03 PM    [facility]

## 2014-07-19 ENCOUNTER — Other Ambulatory Visit: Payer: Self-pay | Admitting: Cardiology

## 2014-08-16 ENCOUNTER — Other Ambulatory Visit

## 2014-08-20 ENCOUNTER — Other Ambulatory Visit: Payer: Self-pay | Admitting: *Deleted

## 2014-08-20 MED ORDER — NEBIVOLOL HCL 2.5 MG PO TABS: ORAL_TABLET | ORAL | Status: DC

## 2014-08-23 ENCOUNTER — Other Ambulatory Visit: Payer: Self-pay | Admitting: Family Medicine

## 2014-08-23 ENCOUNTER — Ambulatory Visit: Admitting: Internal Medicine

## 2014-08-23 ENCOUNTER — Telehealth: Payer: Self-pay | Admitting: Physician Assistant

## 2014-08-23 MED ORDER — NEBIVOLOL HCL 2.5 MG PO TABS: ORAL_TABLET | ORAL | Status: DC

## 2014-08-23 MED ORDER — LOSARTAN POTASSIUM 100 MG PO TABS: 100.0000 mg | ORAL_TABLET | Freq: Every day | ORAL | Status: DC

## 2014-08-23 NOTE — Telephone Encounter (Signed)
Patient called.  Cost of Bystolic too much at current pharmacy .  Resent to [facility]. 90days worth  Nialah Saravia, PAC

## 2014-08-23 NOTE — Telephone Encounter (Signed)
Originally filled on 05/24/14 for 1 year.  Sent 6 months to [facility] by e-scribe.

## 2014-08-25 ENCOUNTER — Encounter (HOSPITAL_BASED_OUTPATIENT_CLINIC_OR_DEPARTMENT_OTHER)

## 2014-08-26 ENCOUNTER — Telehealth: Payer: Self-pay

## 2014-08-26 ENCOUNTER — Ambulatory Visit: Admitting: Cardiology

## 2014-08-26 NOTE — Telephone Encounter (Signed)
Left mess to return call ( concerning overdue mammogram) 

## 2014-08-26 NOTE — Telephone Encounter (Signed)
Pt will call for appt

## 2014-08-26 NOTE — Telephone Encounter (Signed)
x

## 2014-08-30 ENCOUNTER — Other Ambulatory Visit: Payer: Self-pay | Admitting: *Deleted

## 2014-09-03 ENCOUNTER — Other Ambulatory Visit: Payer: Self-pay

## 2014-09-03 MED ORDER — NEBIVOLOL HCL 2.5 MG PO TABS: ORAL_TABLET | ORAL | Status: DC

## 2014-09-03 MED ORDER — AMLODIPINE BESYLATE 5 MG PO TABS: 5.0000 mg | ORAL_TABLET | Freq: Every day | ORAL | Status: DC

## 2014-09-10 ENCOUNTER — Other Ambulatory Visit (INDEPENDENT_AMBULATORY_CARE_PROVIDER_SITE_OTHER)

## 2014-09-10 DIAGNOSIS — D649 Anemia, unspecified: Secondary | ICD-10-CM | POA: Diagnosis not present

## 2014-09-10 DIAGNOSIS — I10 Essential (primary) hypertension: Secondary | ICD-10-CM | POA: Diagnosis not present

## 2014-09-10 LAB — HEPATIC FUNCTION PANEL
ALT: 18 U/L (ref 0–35)
AST: 25 U/L (ref 0–37)
Albumin: 4.3 g/dL (ref 3.5–5.2)
Alkaline Phosphatase: 64 U/L (ref 39–117)
Bilirubin, Direct: 0.1 mg/dL (ref 0.0–0.3)
Total Bilirubin: 1.1 mg/dL (ref 0.2–1.2)
Total Protein: 7.8 g/dL (ref 6.0–8.3)

## 2014-09-10 LAB — BASIC METABOLIC PANEL WITH GFR
BUN: 15 mg/dL (ref 6–23)
CO2: 29 meq/L (ref 19–32)
Calcium: 9.5 mg/dL (ref 8.4–10.5)
Chloride: 102 meq/L (ref 96–112)
Creatinine, Ser: 0.98 mg/dL (ref 0.40–1.20)
GFR: 73.64 mL/min
Glucose, Bld: 99 mg/dL (ref 70–99)
Potassium: 3.7 meq/L (ref 3.5–5.1)
Sodium: 139 meq/L (ref 135–145)

## 2014-09-10 LAB — CBC WITH DIFFERENTIAL/PLATELET
Basophils Absolute: 0 K/uL (ref 0.0–0.1)
Basophils Relative: 0.5 % (ref 0.0–3.0)
Eosinophils Absolute: 0.3 K/uL (ref 0.0–0.7)
Eosinophils Relative: 3.7 % (ref 0.0–5.0)
HCT: 44.2 % (ref 36.0–46.0)
Hemoglobin: 14.7 g/dL (ref 12.0–15.0)
Lymphocytes Relative: 36.2 % (ref 12.0–46.0)
Lymphs Abs: 2.6 K/uL (ref 0.7–4.0)
MCHC: 33.4 g/dL (ref 30.0–36.0)
MCV: 92.3 fl (ref 78.0–100.0)
Monocytes Absolute: 0.6 K/uL (ref 0.1–1.0)
Monocytes Relative: 8.6 % (ref 3.0–12.0)
Neutro Abs: 3.7 K/uL (ref 1.4–7.7)
Neutrophils Relative %: 51 % (ref 43.0–77.0)
Platelets: 210 K/uL (ref 150.0–400.0)
RBC: 4.78 Mil/uL (ref 3.87–5.11)
RDW: 13.9 % (ref 11.5–15.5)
WBC: 7.2 K/uL (ref 4.0–10.5)

## 2014-09-18 ENCOUNTER — Telehealth: Payer: Self-pay | Admitting: Family Medicine

## 2014-09-18 NOTE — Telephone Encounter (Signed)
lmom for pt to call and resch 09-20-14 appt. I gave the patient my extension

## 2014-09-18 NOTE — Telephone Encounter (Signed)
-----   Message from Madelin HeadingsWanda K Panosh, MD sent at 09/17/2014  6:22 PM EDT ----- Regarding: I want  change appt to later time (after she sees cardiology ) Please change/ move  patient's appointment  From this week , Friday to be seen sometime AFTER  her cardiology visit by Dr Antoine PocheHochrein on July 13 .    ? End of July or August ?  [facility].    Patient was originally suppose to have fu in may but correctly saw cardiology  Instead     Would be a more efficient visit to be seen after cards evaluation to help her BP control .

## 2014-09-19 NOTE — Telephone Encounter (Signed)
Pt has been resc to 10-15-14

## 2014-09-19 NOTE — Telephone Encounter (Signed)
lmom for pt to call and rsc 09-20-14 appt

## 2014-09-20 ENCOUNTER — Ambulatory Visit: Admitting: Internal Medicine

## 2014-09-25 ENCOUNTER — Encounter: Payer: Self-pay | Admitting: Cardiology

## 2014-09-25 ENCOUNTER — Ambulatory Visit (INDEPENDENT_AMBULATORY_CARE_PROVIDER_SITE_OTHER): Admitting: Cardiology

## 2014-09-25 VITALS — BP 176/108 | HR 78 | Ht 63.0 in | Wt 190.0 lb

## 2014-09-25 DIAGNOSIS — I10 Essential (primary) hypertension: Secondary | ICD-10-CM | POA: Diagnosis not present

## 2014-09-25 DIAGNOSIS — I2510 Atherosclerotic heart disease of native coronary artery without angina pectoris: Secondary | ICD-10-CM | POA: Diagnosis not present

## 2014-09-25 NOTE — Patient Instructions (Signed)
Your physician wants you to follow-up in: 6 Months. You will receive a reminder letter in the mail two months in advance. If you don't receive a letter, please call our office to schedule the follow-up appointment.  If Blood Pressure is consistently high please call our office and let us know.

## 2014-09-25 NOTE — Progress Notes (Signed)
Cardiology Office Note   Date:  09/25/2014   ID:  [name], DOB 11/29/1950, MRN [id number]  PCP:  Lorretta HarpPANOSH,WANDA KOTVAN, MD  Cardiologist:   Rollene RotundaJames Carlissa Pesola, MD   Chief Complaint  Patient presents with  . Coronary Artery Disease      History of Present Illness: [name] is a 64 y.o. female who presents for for evaluation of difficult to control hypertension.   She has been keeping a blood pressure diary and will hold although in the last fe days she has had some pain in her hamstrings. She thinks from doing some activities.  With this pain and stress her blood pressure. She really does not want to take more medications however. She hasn't noticed any palpitations, presyncope or syncope. She's not describing any new chest pressure, neck or arm discomfort. She's had no weight gain or edema.  Past Medical History  Diagnosis Date  . Hypertension   . Arthritis   . Depression   . Seasonal allergies   . Stroke 09/2008  . Chicken pox   . Diverticulitis   . Thyroid disease     Past Surgical History  Procedure Laterality Date  . Wisdom tooth extraction    . Minor breast biopsy  1979     Current Outpatient Prescriptions  Medication Sig Dispense Refill  . amLODipine (NORVASC) 5 MG tablet Take 1 tablet (5 mg total) by mouth daily. 90 tablet 1  . Ascorbic Acid (VITAMIN C) 1000 MG tablet Take 1,000 mg by mouth every morning.     . B Complex-C (B-COMPLEX WITH VITAMIN C) tablet Take 1 tablet by mouth daily.      . Cholecalciferol (VITAMIN D PO) Take 1,000 Units by mouth daily.    . Coenzyme Q10 (CO Q-10) 200 MG CAPS Take 1 capsule by mouth daily.     . Iodine, Kelp, (KELP PO) Take by mouth. Take 1 teaspoon by mouth daily    . LevOCARNitine (L-CARNITINE PO) Take 800 mg by mouth every morning.     Marland Kitchen. losartan (COZAAR) 100 MG tablet Take 1 tablet (100 mg total) by mouth daily. (Patient taking differently: Take 50 mg by mouth daily. ) 90 tablet 1  . MULTIPLE VITAMIN  PO Take by mouth.    Marland Kitchen. NATTOKINASE PO Take 1 tablet by mouth every morning. Helps blood flow    . nebivolol (BYSTOLIC) 2.5 MG tablet TAKE 1 TABLET (2.5 MG TOTAL) BY MOUTH DAILY. 90 tablet 1  . NON FORMULARY Barley Green:  Takes 1 tsp daily    . NON FORMULARY Chlorella:  Takes 15 capsules daily    . Probiotic Product (PROBIOTIC PO) Take 1 tablet by mouth daily. Bifido Beadlet 50+    . SPIRULINA PO Take 1 teaspoon by mouth daily     No current facility-administered medications for this visit.    Allergies:   Penicillins and Sulfa antibiotics    ROS:  Please see the history of present illness.   Otherwise, review of systems are positive for none.   All other systems are reviewed and negative.    PHYSICAL EXAM: VS:  BP 176/108 mmHg  Pulse 78  Ht 5\' 3"  (1.6 m)  Wt 190 lb (86.183 kg)  BMI 33.67 kg/m2 , BMI Body mass index is 33.67 kg/(m^2). GENERAL:  Well appearing NECK:  No jugular venous distention, waveform within normal limits, carotid upstroke brisk and symmetric, no bruits, no thyromegaly LUNGS:  Clear to auscultation bilaterally CHEST:  Unremarkable HEART:  PMI  not displaced or sustained,S1 and S2 within normal limits, no S3, no S4, no clicks, no rubs, no murmurs ABD:  Flat, positive bowel sounds normal in frequency in pitch, no bruits, no rebound, no guarding, no midline pulsatile mass, no hepatomegaly, no splenomegaly EXT:  2 plus pulses throughout, no edema, no cyanosis no clubbing   EKG:  EKG is not ordered today.   Recent Labs: 12/17/2013: TSH 2.55 02/19/2014: Magnesium 2.0 09/10/2014: ALT 18; BUN 15; Creatinine, Ser 0.98; Hemoglobin 14.7; Platelets 210.0; Potassium 3.7; Sodium 139    Lipid Panel    Component Value Date/Time   CHOL 150 05/24/2013 0317   TRIG 85 05/24/2013 0317   HDL 56 05/24/2013 0317   CHOLHDL 2.7 05/24/2013 0317   VLDL 17 05/24/2013 0317   LDLCALC 77 05/24/2013 0317      Wt Readings from Last 3 Encounters:  09/25/14 190 lb (86.183 kg)    06/18/14 191 lb 1.6 oz (86.682 kg)  05/24/14 192 lb 6.4 oz (87.272 kg)      Other studies Reviewed: Additional studies/ records that were reviewed today include: None  ASSESSMENT AND PLAN:  HTN: We had a long discussion about this. She did not have a sleep study that suggested because of some insurance problems. She does not want to take higher doses of medicines. We therefore, talked about therapeutic lifestyle changes that she will keep her blood pressure readings and if they continue to run high as they have in the last day he will let me know if she likely had spiral lactone.  PVCs:  She has a lot of ectopy including PVCs and PACs but she's not particularly symptomatic with this. This will be managed in the context of treating her hypertension.   Current medicines are reviewed at length with the patient today.  The patient does not have concerns regarding medicines.  The following changes have been made:  As above  Labs/ tests ordered today include: None    Disposition:   FU with me in sx months    Signed, Rollene Rotunda, MD  09/25/2014 9:19 AM    [facility]

## 2014-10-11 ENCOUNTER — Ambulatory Visit (HOSPITAL_BASED_OUTPATIENT_CLINIC_OR_DEPARTMENT_OTHER)

## 2014-10-15 ENCOUNTER — Encounter: Payer: Self-pay | Admitting: Internal Medicine

## 2014-10-15 ENCOUNTER — Ambulatory Visit (INDEPENDENT_AMBULATORY_CARE_PROVIDER_SITE_OTHER): Admitting: Internal Medicine

## 2014-10-15 VITALS — BP 185/84 | Temp 98.5°F | Wt 192.3 lb

## 2014-10-15 DIAGNOSIS — I119 Hypertensive heart disease without heart failure: Secondary | ICD-10-CM

## 2014-10-15 DIAGNOSIS — IMO0001 Reserved for inherently not codable concepts without codable children: Secondary | ICD-10-CM

## 2014-10-15 DIAGNOSIS — I10 Essential (primary) hypertension: Secondary | ICD-10-CM | POA: Diagnosis not present

## 2014-10-15 DIAGNOSIS — M79605 Pain in left leg: Secondary | ICD-10-CM

## 2014-10-15 DIAGNOSIS — M79602 Pain in left arm: Secondary | ICD-10-CM

## 2014-10-15 DIAGNOSIS — Z5189 Encounter for other specified aftercare: Secondary | ICD-10-CM

## 2014-10-15 DIAGNOSIS — R0683 Snoring: Secondary | ICD-10-CM | POA: Diagnosis not present

## 2014-10-15 DIAGNOSIS — R030 Elevated blood-pressure reading, without diagnosis of hypertension: Secondary | ICD-10-CM

## 2014-10-15 NOTE — Patient Instructions (Addendum)
Proceed with sleep evaluation when you can  I think it  Can help.   Stay on bp med  And     And fu with   Cardiology again .   Plan   ROV after seeing dr H   In about 5 months Left leg pain seems from  Back or ms .      But if    persistent or progressive  Contact us we can get a doppler  Of the leg .

## 2014-10-15 NOTE — Progress Notes (Signed)
Pre visit review using our clinic review tool, if applicable. No additional management support is needed unless otherwise documented below in the visit note.  Chief Complaint  Patient presents with  . Hypertension  . Follow-up    HPI:  [name] 64 y.o.  comes in for chronic disease/ medication management  She has resistant ht  And has seen cardiology team   Ht  See cards note  wrist readings  First am reading 166 then down to 130 140 range   Reviewed her machine .   Snoring   Appts... insurance issues  But will proceed with sleep eval    She did some deck work and on knees soon after "Hamstrings  seized up on her"  And hard to work  And then stretched and did better   Now down leg left . r esidula local pain  No acute back issues asks opinon coul dit be circulation  Etc    Not recenetly exercising otherwise but using  trampoline  Every other day .  ROS: See pertinent positives and negatives per HPI. No cough syncope  Past Medical History  Diagnosis Date  . Hypertension   . Arthritis   . Depression   . Seasonal allergies   . Stroke 09/2008  . Chicken pox   . Diverticulitis   . Thyroid disease     Family History  Problem Relation Age of Onset  . Heart failure Mother   . Heart disease Mother   . Diabetes Mother   . Hypertension Mother   . Hypertension Father   . Hypertension Sister   . Hypertension Brother   . Cancer Brother     Prostate  . Hypertension Sister   . Diabetes Maternal Grandmother     History   Social History  . Marital Status: Widowed    Spouse Name: N/A  . Number of Children: N/A  . Years of Education: N/A   Social History Main Topics  . Smoking status: Never Smoker   . Smokeless tobacco: Not on file  . Alcohol Use: Yes     Comment: Social Use only  . Drug Use: No  . Sexual Activity: Not on file   Other Topics Concern  . 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 amLODipine (NORVASC) 5 MG tablet Take 1 tablet (5 mg total) by mouth daily. 90 tablet 1  . Ascorbic Acid (VITAMIN C) 1000 MG tablet Take 1,000 mg by mouth every morning.     . B Complex-C (B-COMPLEX WITH VITAMIN C) tablet Take 1 tablet by mouth daily.      . Cholecalciferol (VITAMIN D PO) Take 1,000 Units by mouth daily.    . Coenzyme Q10 (CO Q-10) 200 MG CAPS Take 1 capsule by mouth daily.     . Iodine, Kelp, (KELP PO) Take by mouth. Take 1 teaspoon by mouth daily    . LevOCARNitine (L-CARNITINE PO) Take 800 mg by mouth every morning.     Marland Kitchen losartan (COZAAR)  100 MG tablet Take 1 tablet (100 mg total) by mouth daily. (Patient taking differently: Take 50 mg by mouth daily. ) 90 tablet 1  . MULTIPLE VITAMIN PO Take by mouth.    Marland Kitchen NATTOKINASE PO Take 1 tablet by mouth every morning. Helps blood flow    . nebivolol (BYSTOLIC) 2.5 MG tablet TAKE 1 TABLET (2.5 MG TOTAL) BY MOUTH DAILY. 90 tablet 1  . NON FORMULARY Barley Green:  Takes 1 tsp daily    . NON FORMULARY Chlorella:  Takes 15 capsules daily    . Probiotic Product (PROBIOTIC PO) Take 1 tablet by mouth daily. Bifido Beadlet 50+    . SPIRULINA PO Take 1 teaspoon by mouth daily     No facility-administered medications prior to visit.     EXAM:  BP 185/84 mmHg  Temp(Src) 98.5 F (36.9 C) (Oral)  Wt 192 lb 4.8 oz (87.227 kg)  Body mass index is 34.07 kg/(m^2).  GENERAL: vitals reviewed and listed above, alert, oriented, appears well hydrated and in no acute distress HEENT: atraumatic, conjunctiva  clear, no obvious abnormalities on inspection of external nose and ears  NECK: no obvious masses on inspection  palpation  LUNGS: clear to auscultation bilaterally, no wheezes, rales or rhonchi, good air movement CV: HRRR, no clubbing cyanosis or  peripheral edema nl cap refill  MS: moves all extremities without noticeable focal  Abnormality  Left leg no edema no focal swelling  Points to left lateral distal thight o just below knee  PSYCH: pleasant and cooperative, no obvious depression or anxiety Lab Results  Component Value Date   WBC 7.2 09/10/2014   HGB 14.7 09/10/2014   HCT 44.2 09/10/2014   PLT 210.0 09/10/2014   GLUCOSE 99 09/10/2014   CHOL 150 05/24/2013   TRIG 85 05/24/2013   HDL 56 05/24/2013   LDLCALC 77 05/24/2013   ALT 18 09/10/2014   AST 25 09/10/2014   NA 139 09/10/2014   K 3.7 09/10/2014   CL 102 09/10/2014   CREATININE 0.98 09/10/2014   BUN 15 09/10/2014   CO2 29 09/10/2014   TSH 2.55 12/17/2013   INR 1.04 02/01/2011   HGBA1C 5.7* 05/23/2013   BP Readings from Last 3 Encounters:  10/15/14 185/84  09/25/14 176/108  06/18/14 198/112    ASSESSMENT AND PLAN:  Discussed the following assessment and plan:  Hypertensive heart disease without heart failure  Essential hypertension  Alternative medicine  Snoring  Leg pain, lateral, left  White coat hypertension Erratic readings prob WC effect on top of labile resistant ht  My initial readings were surprisingly better when relaxed and talking of other subjects  . Her wrist readings s later were in the 180 range and then 160  She is not currently interested in adjusting  meds  Consider inc bystolic to 5  .  Left leg sx seem mecahnical and ms but has ? About vascular causes   nto seemingly dvt but if  persistent or progressive then contact us  -Patient advised to return or notify health care team  if symptoms worsen ,persist or new concerns arise. Total visit > 50% spent counseling and coordinating care as indicated in above note and in instructions to patient .    Patient Instructions  Proceed with sleep  evaluation when you can  I think it  Can help.   Stay on bp med  And     And fu with   Cardiology again .   Plan  ROV after seeing dr H   In about 5 months Left leg pain seems from  Back or ms .      But if    persistent or progressive  Contact us we can get a doppler  Of the leg .      Neta Mends. Garyn Waguespack M.D.  ASSESSMENT AND PLAN:  HTN: We had a long discussion about this. She did not have a sleep study that suggested because of some insurance problems. She does not want to take higher doses of medicines. We therefore, talked about therapeutic lifestyle changes that she will keep her blood pressure readings and if they continue to run high as they have in the last day he will let me know if she likely had spiral lactone.  PVCs: She has a lot of ectopy including PVCs and PACs but she's not particularly symptomatic with this. This will be managed in the context of treating her hypertension.   Current medicines are reviewed at length with the patient today. The patient does not have concerns regarding medicines.  The following changes have been made: As above  Labs/ tests ordered today include: None    Disposition: FU with me in sx months    Signed, Rollene Rotunda, MD  09/25/2014 9:19 AM  [facility]

## 2014-10-21 ENCOUNTER — Ambulatory Visit: Admitting: Cardiology

## 2014-12-29 ENCOUNTER — Ambulatory Visit (INDEPENDENT_AMBULATORY_CARE_PROVIDER_SITE_OTHER)

## 2014-12-29 ENCOUNTER — Other Ambulatory Visit: Payer: Self-pay | Admitting: Emergency Medicine

## 2014-12-29 ENCOUNTER — Ambulatory Visit (INDEPENDENT_AMBULATORY_CARE_PROVIDER_SITE_OTHER): Admitting: Emergency Medicine

## 2014-12-29 VITALS — BP 142/100 | HR 73 | Temp 98.1°F | Resp 18 | Ht 63.0 in | Wt 189.5 lb

## 2014-12-29 DIAGNOSIS — M79661 Pain in right lower leg: Secondary | ICD-10-CM | POA: Diagnosis not present

## 2014-12-29 DIAGNOSIS — M5440 Lumbago with sciatica, unspecified side: Secondary | ICD-10-CM | POA: Diagnosis not present

## 2014-12-29 DIAGNOSIS — I10 Essential (primary) hypertension: Secondary | ICD-10-CM

## 2014-12-29 DIAGNOSIS — Z5189 Encounter for other specified aftercare: Secondary | ICD-10-CM

## 2014-12-29 DIAGNOSIS — R935 Abnormal findings on diagnostic imaging of other abdominal regions, including retroperitoneum: Secondary | ICD-10-CM

## 2014-12-29 DIAGNOSIS — M79662 Pain in left lower leg: Secondary | ICD-10-CM

## 2014-12-29 DIAGNOSIS — R1010 Upper abdominal pain, unspecified: Secondary | ICD-10-CM | POA: Diagnosis not present

## 2014-12-29 LAB — POCT CBC
GRANULOCYTE PERCENT: 64.7 % (ref 37–80)
HCT, POC: 44 % (ref 37.7–47.9)
HEMOGLOBIN: 14.8 g/dL (ref 12.2–16.2)
Lymph, poc: 2.7 (ref 0.6–3.4)
MCH: 30.3 pg (ref 27–31.2)
MCHC: 33.6 g/dL (ref 31.8–35.4)
MCV: 90 fL (ref 80–97)
MID (cbc): 0.3 (ref 0–0.9)
MPV: 7.9 fL (ref 0–99.8)
POC Granulocyte: 5.4 (ref 2–6.9)
POC LYMPH PERCENT: 32.2 %L (ref 10–50)
POC MID %: 3.1 %M (ref 0–12)
Platelet Count, POC: 172 10*3/uL (ref 142–424)
RBC: 4.88 M/uL (ref 4.04–5.48)
RDW, POC: 13.8 %
WBC: 8.3 10*3/uL (ref 4.6–10.2)

## 2014-12-29 LAB — COMPLETE METABOLIC PANEL WITH GFR
ALT: 17 U/L (ref 6–29)
AST: 23 U/L (ref 10–35)
Albumin: 4.3 g/dL (ref 3.6–5.1)
Alkaline Phosphatase: 65 U/L (ref 33–130)
BUN: 12 mg/dL (ref 7–25)
CO2: 28 mmol/L (ref 20–31)
Calcium: 9.7 mg/dL (ref 8.6–10.4)
Chloride: 104 mmol/L (ref 98–110)
Creat: 0.94 mg/dL (ref 0.50–0.99)
GFR, EST NON AFRICAN AMERICAN: 65 mL/min (ref >=60)
GFR, Est African American: 75 mL/min (ref >=60)
GLUCOSE: 98 mg/dL (ref 65–99)
Potassium: 3.7 mmol/L (ref 3.5–5.3)
SODIUM: 140 mmol/L (ref 135–146)
Total Bilirubin: 1.6 mg/dL — ABNORMAL HIGH (ref 0.2–1.2)
Total Protein: 7.7 g/dL (ref 6.1–8.1)

## 2014-12-29 LAB — GLUCOSE, POCT (MANUAL RESULT ENTRY): POC GLUCOSE: 89 mg/dL (ref 70–99)

## 2014-12-29 MED ORDER — LOSARTAN POTASSIUM 50 MG PO TABS: ORAL_TABLET | ORAL | Status: DC

## 2014-12-29 NOTE — Progress Notes (Addendum)
Patient ID: [name], female   DOB: [birth year], 64 y.o.   MRN: [id number]     This chart was scribed for Lesle Chris, MD by Littie Deeds, Medical Scribe. This patient was seen in room 11 and the patient's care was started at 1:23 PM.   Chief Complaint:  Chief Complaint  Patient presents with  . Abdominal Pain  . Leg Pain    C/O right leg pressure & hat a spot on left leg that she wants checked    HPI: [name] is a 64 y.o. female with a history of hypertension who reports to [facility] today complaining of bilateral hip pain radiating down her posterior thighs to her knees that started about 5 months ago after she did some work on her deck. Patient states this is due to sciatica. Her left leg has worsened overall. She went through acupuncture at the time. She feels as if her leg muscles have strengthened due to yoga and stretching; however, she sometimes feels as if her leg may give out when walking.   Patient also complains of abdominal pain. She reports having gas and a gurgling noise. She believes this may be related to her intestines. She has not had a colonoscopy. CT of her abdomen on 05/23/13 showed: Scattered diverticulosis of distal descending and sigmoid colon with questionable sigmoid wall thickening versus artifact from underdistention.  She also reports having residual numbness on the right side of her face, the tips of her right toes, and her right hand due to a stroke 6 years ago.  Patient works as a Production manager. This does not involve physical activity.  PCP: Dr. Fabian Sharp; she last saw Dr. Fabian Sharp this past June. Her next appointment will be in November. Cardiologist: Dr. Antoine Poche, was seeing Dr. Anne Fu previously  Past Medical History  Diagnosis Date  . Hypertension   . Arthritis   . Depression   . Seasonal allergies   . Stroke (HCC) 09/2008  . Chicken pox   . Diverticulitis   . Thyroid disease    Past Surgical History  Procedure  Laterality Date  . Wisdom tooth extraction    . Minor breast biopsy  1979   Social History   Social History  . Marital Status: Widowed    Spouse Name: N/A  . Number of Children: N/A  . Years of Education: N/A   Social History Main Topics  . Smoking status: Never Smoker   . Smokeless tobacco: None  . Alcohol Use: Yes     Comment: Social Use only  . Drug Use: No  . Sexual Activity: Not Asked   Other Topics Concern  . 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 Heart failure Mother   . Heart disease Mother   . Diabetes Mother   . Hypertension Mother   . Hypertension Father   . Hypertension Sister   . Hypertension Brother   . Cancer Brother     Prostate  . Hypertension Sister   . Diabetes Maternal Grandmother  Allergies  Allergen Reactions  . Penicillins Swelling  . Sulfa Antibiotics Itching   Prior to Admission medications   Medication Sig Start Date End Date Taking? Authorizing Provider  amLODipine (NORVASC) 5 MG tablet Take 1 tablet (5 mg total) by mouth daily. 09/03/14  Yes Rollene RotundaJames Hochrein, MD  Ascorbic Acid (VITAMIN C) 1000 MG tablet Take 1,000 mg by mouth every morning.    Yes Historical Provider, MD  B Complex-C (B-COMPLEX WITH VITAMIN C) tablet Take 1 tablet by mouth daily.     Yes Historical Provider, MD  Cholecalciferol (VITAMIN D PO) Take 1,000 Units by mouth daily.   Yes Historical Provider, MD  Coenzyme Q10 (CO Q-10) 200 MG CAPS Take 1 capsule by mouth daily.    Yes Historical Provider, MD  Iodine, Kelp, (KELP PO) Take by mouth. Take 1 teaspoon by mouth daily   Yes Historical Provider, MD  MULTIPLE  VITAMIN PO Take by mouth.   Yes Historical Provider, MD  NATTOKINASE PO Take 1 tablet by mouth every morning. Helps blood flow   Yes Historical Provider, MD  nebivolol (BYSTOLIC) 2.5 MG tablet TAKE 1 TABLET (2.5 MG TOTAL) BY MOUTH DAILY. 09/03/14  Yes Rollene RotundaJames Hochrein, MD  NON FORMULARY Barley Green:  Takes 1 tsp daily   Yes Historical Provider, MD  Probiotic Product (PROBIOTIC PO) Take 1 tablet by mouth daily. Bifido Beadlet 50+   Yes Historical Provider, MD  SPIRULINA PO Take 1 teaspoon by mouth daily   Yes Historical Provider, MD  LevOCARNitine (L-CARNITINE PO) Take 800 mg by mouth every morning.     Historical Provider, MD  losartan (COZAAR) 100 MG tablet Take 1 tablet (100 mg total) by mouth daily. Patient not taking: Reported on 12/29/2014 08/23/14   Madelin HeadingsWanda K Panosh, MD  NON FORMULARY Chlorella:  Takes 15 capsules daily    Historical Provider, MD     ROS: The patient denies fevers, chills, night sweats, unintentional weight loss, chest pain, palpitations, wheezing, dyspnea on exertion, dysuria, hematuria, melena.   All other systems have been reviewed and were otherwise negative with the exception of those mentioned in the HPI and as above.    PHYSICAL EXAM: Filed Vitals:   12/29/14 1211  BP: 142/100  Pulse: 73  Temp: 98.1 F (36.7 C)  Resp: 18   Body mass index is 33.58 kg/(m^2).   General: Alert, no acute distress HEENT:  Normocephalic, atraumatic, oropharynx patent. Eye: Nonie HoyerOMI, [facility] Cardiovascular:  Regular rate and rhythm, no rubs murmurs or gallops.  No Carotid bruits, radial pulse intact. No pedal edema. Repeat blood pressure: 170/110. Respiratory: Clear to auscultation bilaterally.  No wheezes, rales, or rhonchi.  No cyanosis, no use of accessory musculature Abdominal:  Musculoskeletal: Gait intact. No edema, tenderness Skin: No rashes. Neurologic: Facial musculature symmetric. Psychiatric: Patient acts appropriately throughout our interaction. Lymphatic: No cervical  or submandibular lymphadenopathy    LABS: Results for orders placed or performed in visit on 12/29/14  POCT CBC  Result Value Ref Range   WBC 8.3 4.6 - 10.2 K/uL   Lymph, poc 2.7 0.6 - 3.4   POC LYMPH PERCENT 32.2 10 - 50 %L   MID (cbc) 0.3 0 - 0.9   POC MID % 3.1 0 - 12 %M   POC Granulocyte 5.4 2 - 6.9   Granulocyte percent 64.7 37 - 80 %G   RBC 4.88 4.04 - 5.48 M/uL   Hemoglobin 14.8 12.2 - 16.2 g/dL   HCT, POC 16.144.0 09.637.7 - 47.9 %  MCV 90.0 80 - 97 fL   MCH, POC 30.3 27 - 31.2 pg   MCHC 33.6 31.8 - 35.4 g/dL   RDW, POC 21.3 %   Platelet Count, POC 172 142 - 424 K/uL   MPV 7.9 0 - 99.8 fL  POCT glucose (manual entry)  Result Value Ref Range   POC Glucose 89 70 - 99 mg/dl     EKG/XRAY:   Primary read interpreted by [facility]. Cleta Alberts at [facility]. Back films are normal. The mediastinum is wide on the frontal view. Please comment. There may be a few small isolated air-fluid levels right side of the abdomen.   ASSESSMENT/PLAN: Radiologist did not see any acute problems in her films. She does have multilevel degenerative disc disease. The what appeared to be enlargement of the anterior mediastinum was felt to be secondary to uncoiling of the aorta. Patient interested in a vascular Doppler of the lower extremities to rule out a clot. Financially she cannot afford to have her sleep study. She is willing to start on losartan at a low dose for better blood pressure control. She will call Dr. Rosezella [place] office for a sooner appointment. She was agreeable to follow-up with GI.  By signing my name below, I, Littie Deeds, attest that this documentation has been prepared under the direction and in the presence of Lesle Chris, MD.  Electronically Signed: Littie Deeds, Medical Scribe. 12/29/2014. 1:23 PM.   Gross sideeffects, risk and benefits, and alternatives of medications d/w patient. Patient is aware that all medications have potential sideeffects and we are unable to predict every sideeffect or drug-drug  interaction that may occur.  Lesle Chris MD 12/29/2014 1:23 PM

## 2014-12-29 NOTE — Patient Instructions (Addendum)
Take one half tablet a day of your new blood pressure medicine. I have scheduled you for an appointment to see GI. I have scheduled you for an appointment to have a Doppler of the lower extremities. Please call and get an appointment with your doctor to follow-up on your blood pressure. See if you can obtain insurance to cover a sleep study in the future.

## 2014-12-30 LAB — BILIRUBIN, FRACTIONATED(TOT/DIR/INDIR)
Bilirubin, Direct: 0.3 mg/dL — ABNORMAL HIGH (ref <=0.2)
Indirect Bilirubin: 1.3 mg/dL — ABNORMAL HIGH (ref 0.2–1.2)
Total Bilirubin: 1.6 mg/dL — ABNORMAL HIGH (ref 0.2–1.2)

## 2014-12-31 ENCOUNTER — Other Ambulatory Visit: Payer: Self-pay | Admitting: Cardiology

## 2014-12-31 ENCOUNTER — Other Ambulatory Visit: Payer: Self-pay | Admitting: Internal Medicine

## 2014-12-31 NOTE — Telephone Encounter (Signed)
She is supposed to be on losartan 25 mg 1 a day not the 100 mg.

## 2015-01-01 ENCOUNTER — Other Ambulatory Visit: Payer: Self-pay | Admitting: Cardiology

## 2015-01-01 MED ORDER — NEBIVOLOL HCL 2.5 MG PO TABS: 2.5000 mg | ORAL_TABLET | Freq: Every day | ORAL | Status: DC

## 2015-01-01 NOTE — Telephone Encounter (Signed)
°  STAT if patient is at the pharmacy , call can be transferred to refill team.   1. Which medications need to be refilled? Bystolic 2.5 mg   2. Which pharmacy/location is medication to be sent to?[facility] on [address]   3. Do they need a 30 day or 90 day supply?30

## 2015-01-01 NOTE — Telephone Encounter (Signed)
30 day refills was send in to [facility] at [address]

## 2015-01-18 ENCOUNTER — Ambulatory Visit (INDEPENDENT_AMBULATORY_CARE_PROVIDER_SITE_OTHER): Admitting: Family Medicine

## 2015-01-18 ENCOUNTER — Ambulatory Visit (INDEPENDENT_AMBULATORY_CARE_PROVIDER_SITE_OTHER)

## 2015-01-18 VITALS — BP 175/108 | HR 69 | Temp 98.4°F | Resp 20 | Ht 63.0 in | Wt 190.0 lb

## 2015-01-18 DIAGNOSIS — R1013 Epigastric pain: Secondary | ICD-10-CM

## 2015-01-18 DIAGNOSIS — I10 Essential (primary) hypertension: Secondary | ICD-10-CM

## 2015-01-18 LAB — POCT CBC
Granulocyte percent: 64.9 %G (ref 37–80)
HCT, POC: 43.8 % (ref 37.7–47.9)
Hemoglobin: 14.6 g/dL (ref 12.2–16.2)
Lymph, poc: 2.3 (ref 0.6–3.4)
MCH, POC: 30.1 pg (ref 27–31.2)
MCHC: 33.4 g/dL (ref 31.8–35.4)
MCV: 90.3 fL (ref 80–97)
MID (cbc): 0.3 (ref 0–0.9)
MPV: 7.8 fL (ref 0–99.8)
POC Granulocyte: 4.9 (ref 2–6.9)
POC LYMPH PERCENT: 30.7 %L (ref 10–50)
POC MID %: 4.4 %M (ref 0–12)
Platelet Count, POC: 187 10*3/uL (ref 142–424)
RBC: 4.85 M/uL (ref 4.04–5.48)
RDW, POC: 13.7 %
WBC: 7.6 10*3/uL (ref 4.6–10.2)

## 2015-01-18 LAB — POCT URINALYSIS DIP (MANUAL ENTRY)
Bilirubin, UA: NEGATIVE
Blood, UA: NEGATIVE
Glucose, UA: NEGATIVE
Ketones, POC UA: NEGATIVE
Leukocytes, UA: NEGATIVE
Nitrite, UA: NEGATIVE
Protein Ur, POC: NEGATIVE
Spec Grav, UA: 1.01
Urobilinogen, UA: 0.2
pH, UA: 7

## 2015-01-18 MED ORDER — CLONIDINE HCL 0.1 MG PO TABS
0.1000 mg | ORAL_TABLET | Freq: Once | ORAL | Status: AC
  Administered 2015-01-18: 0.1 mg via ORAL

## 2015-01-18 MED ORDER — NEBIVOLOL HCL 5 MG PO TABS
5.0000 mg | ORAL_TABLET | Freq: Every day | ORAL | Status: DC
Start: 2015-01-18 — End: 2015-05-05

## 2015-01-18 MED ORDER — METRONIDAZOLE 500 MG PO TABS
500.0000 mg | ORAL_TABLET | Freq: Two times a day (BID) | ORAL | Status: DC
Start: 2015-01-18 — End: 2015-02-04

## 2015-01-18 NOTE — Addendum Note (Signed)
Addended by: Elvina SidleLAUENSTEIN, Briton Sellman on: 01/18/2015 03:50 PM   Modules accepted: Orders

## 2015-01-18 NOTE — Progress Notes (Addendum)
This chart was scribed for Elvina Sidle, MD by Stann Ore, medical scribe at [facility].The patient was seen in exam room 14 and the patient's care was started at 2:15 PM.  Patient ID: [name] MRN: [id number], DOB: 08-03-1950, 64 y.o. Date of Encounter: 01/18/2015  Primary Physician: Lorretta Harp, MD  Chief Complaint:  Chief Complaint  Patient presents with   Abdominal Pain   Hypertension   Chills    HPI:  [name] is a 64 y.o. female who presents to [facility] complaining of intermittent sharp abd pain that radiates towards her back and down her legs, since her last visit 3 weeks ago. She also feels bloated and gassy. She was previously seen in the ED for similar symptoms but they did not inform her about what had happened. She came into the office, seen by Dr. Cleta Alberts, and was referred to GI. However, her insurance refused to pay for her visit with the GI. She denies constipation, diarrhea; however, she noticed her stools being pieces.   She notes that her BP is really high and cannot get it down even with medication.  She's a Systems developer.   Past Medical History  Diagnosis Date   Hypertension    Arthritis    Depression    Seasonal allergies    Stroke (HCC) 09/2008   Chicken pox    Diverticulitis    Thyroid disease      Home Meds: Prior to Admission medications   Medication Sig Start Date End Date Taking? Authorizing Provider  amLODipine (NORVASC) 5 MG tablet Take 1 tablet (5 mg total) by mouth daily. 09/03/14  Yes Rollene Rotunda, MD  Ascorbic Acid (VITAMIN C) 1000 MG tablet Take 1,000 mg by mouth every morning.    Yes Historical Provider, MD  B Complex-C (B-COMPLEX WITH VITAMIN C) tablet Take 1 tablet by mouth daily.     Yes Historical Provider, MD  Cholecalciferol (VITAMIN D PO) Take 1,000 Units by mouth daily.   Yes Historical Provider, MD  Coenzyme Q10 (CO Q-10) 200 MG CAPS Take 1  capsule by mouth daily.    Yes Historical Provider, MD  Iodine, Kelp, (KELP PO) Take by mouth. Take 1 teaspoon by mouth daily   Yes Historical Provider, MD  LevOCARNitine (L-CARNITINE PO) Take 800 mg by mouth every morning.    Yes Historical Provider, MD  losartan (COZAAR) 50 MG tablet Take a half tablet daily for a dose of 25 mg. 12/29/14  Yes Collene Gobble, MD  MULTIPLE VITAMIN PO Take by mouth.   Yes Historical Provider, MD  NATTOKINASE PO Take 1 tablet by mouth every morning. Helps blood flow   Yes Historical Provider, MD  nebivolol (BYSTOLIC) 2.5 MG tablet Take 1 tablet (2.5 mg total) by mouth daily. 01/01/15  Yes Rollene Rotunda, MD  NON FORMULARY Barley Green:  Takes 1 tsp daily   Yes Historical Provider, MD  NON FORMULARY Chlorella:  Takes 15 capsules daily   Yes Historical Provider, MD  Probiotic Product (PROBIOTIC PO) Take 1 tablet by mouth daily. Bifido Beadlet 50+   Yes Historical Provider, MD  SPIRULINA PO Take 1 teaspoon by mouth daily   Yes Historical Provider, MD    Allergies:  Allergies  Allergen Reactions   Penicillins Swelling   Sulfa Antibiotics Itching    Social History   Social History   Marital Status: Widowed    Spouse Name: N/A   Number of Children: N/A  Years of Education: N/A   Occupational History   Not on file.   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 right now very much   g2p2   Neg tad herbals exercise      Review of Systems: Constitutional: negative for fever,  chills, night sweats, weight changes, or fatigue  HEENT: negative for vision changes, hearing loss, congestion, rhinorrhea, ST, epistaxis, or sinus pressure Cardiovascular: negative for chest pain or palpitations Respiratory: negative for hemoptysis, wheezing, shortness of breath, or cough Abdominal: negative for nausea, vomiting, diarrhea, or constipation; positive for abd pain, abd distention Dermatological: negative for rash Neurologic: negative for headache, dizziness, or syncope All other systems reviewed and are otherwise negative with the exception to those above and in the HPI.  Physical Exam: Blood pressure 183/113, pulse 69, temperature 98.4 F (36.9 C), temperature source Oral, resp. rate 20, height 5\' 3"  (1.6 m), weight 190 lb (86.183 kg), SpO2 97 %., Body mass index is 33.67 kg/(m^2). General: Well developed, well nourished, in no acute distress. Head: Normocephalic, atraumatic, eyes without discharge, sclera non-icteric, nares are without discharge. Bilateral auditory canals clear, TM's are without perforation, pearly grey and translucent with reflective cone of light bilaterally. Oral cavity moist, posterior pharynx without exudate, erythema, peritonsillar abscess, or post nasal drip.  Neck: Supple. No thyromegaly. Full ROM. No lymphadenopathy. Lungs: Clear bilaterally to auscultation without wheezes, rales, or rhonchi. Breathing is unlabored. Heart: RRR with S1 S2. No murmurs, rubs, or gallops appreciated. Abdomen: Soft, non-tender, non-distended. No hepatomegaly. No rebound/guarding. No obvious abdominal masses; high pitch bowel sounds Msk:  Strength and tone normal for age. Extremities/Skin: Warm and dry. No clubbing or cyanosis. No edema. No rashes or suspicious lesions. Neuro: Alert and oriented X 3. Moves all extremities spontaneously. Gait is normal. CNII-XII grossly in tact. Psych:  Responds to questions appropriately with a normal affect.   BP recheck in room, left arm  (sitting) : 180/94  Labs: Results for orders placed or performed in visit on 01/18/15  POCT CBC  Result Value Ref Range   WBC 7.6 4.6 - 10.2 K/uL   Lymph, poc 2.3 0.6 - 3.4   POC LYMPH PERCENT 30.7 10 - 50 %L   MID (cbc) 0.3 0 - 0.9   POC MID % 4.4 0 - 12 %M   POC Granulocyte 4.9 2 - 6.9   Granulocyte percent 64.9 37 - 80 %G   RBC 4.85 4.04 - 5.48 M/uL   Hemoglobin 14.6 12.2 - 16.2 g/dL   HCT, POC 16.143.8 09.637.7 - 47.9 %   MCV 90.3 80 - 97 fL   MCH, POC 30.1 27 - 31.2 pg   MCHC 33.4 31.8 - 35.4 g/dL   RDW, POC 04.513.7 %   Platelet Count, POC 187 142 - 424 K/uL   MPV 7.8 0 - 99.8 fL  POCT urinalysis dipstick  Result Value Ref Range   Color, UA light yellow (A) yellow   Clarity, UA clear clear   Glucose, UA negative negative   Bilirubin, UA negative negative   Ketones, POC UA negative negative   Spec Grav, UA 1.010    Blood, UA negative negative   pH, UA 7.0    Protein Ur, POC negative negative   Urobilinogen, UA 0.2    Nitrite, UA Negative Negative   Leukocytes, UA Negative Negative   [facility] reading (PRIMARY) by  Dr.Milus Glaziertein: xray abdomen: no obvious cause for the epigastric cramping    ASSESSMENT AND PLAN:  64 y.o. year old female with high pitched bowel sounds, crampy epigastric pain which is ongoing.  Patient is overdue for colonoscopy as well. This chart was scribed in my presence and reviewed by me personally.    ICD-9-CM ICD-10-CM   1. Abdominal pain, epigastric 789.06 R10.13 POCT CBC     POCT urinalysis dipstick     COMPLETE METABOLIC PANEL WITH GFR     DG Abd 1 View     metroNIDAZOLE (FLAGYL) 500 MG tablet     Ambulatory referral to Gastroenterology  2. Essential hypertension 401.9 I10 nebivolol (BYSTOLIC) 5 MG tablet   Stop the Losartan   By signing my name below, I, Stann Ore, attest that this documentation has been prepared under the direction and in the presence of Elvina Sidle, MD. Electronically Signed: Stann Ore, Scribe. 01/18/2015 , 2:15  PM .  Signed, Elvina Sidle, MD 01/18/2015 2:15 PM   CT Abdomen Pelvis W Contrast   Status: Final result       PACS Images     Show images for CT Abdomen Pelvis W Contrast     Study Result     CLINICAL DATA Abdominal discomfort, gas, history hypertension  EXAM CT ABDOMEN AND PELVIS WITH CONTRAST  TECHNIQUE Multidetector CT imaging of the abdomen and pelvis was performed using the standard protocol following bolus administration of intravenous contrast. Sagittal and coronal MPR images reconstructed from axial data set.  CONTRAST OMNIPAQUE IOHEXOL 300 MG/ML SOLN. Dilute oral contrast.  COMPARISON 04/13/2003 CT abdomen  FINDINGS Minimal dependent atelectasis right lower lobe these.  Umbilical hernia containing fat.  9 mm cyst laterally left kidney image 35.  Tiny nonobstructing calculus mid right kidney image 39.  Artifacts from dense contrast in right colon traverse right kidney.  Liver, spleen, pancreas, kidneys, and adrenal glands otherwise normal appearance.  Normal appendix.  Probable 3.0 x 2.5 cm diameter uterine leiomyoma image 65.  Unremarkable adnexae.  Atherosclerotic calcification at aortic bifurcation.  Scattered diverticulosis of distal descending and sigmoid colon with questionable sigmoid wall thickening versus artifact from underdistention.  Decompressed bladder.  Incomplete gastric distention unable to exclude gastric wall thickening at proximal to mid stomach.  Small bowel loops unremarkable.  No mass, adenopathy, free fluid, or definite inflammatory process.  Degenerative facet disease changes lower lumbar spine.  IMPRESSION Distal colonic diverticulosis with questionable long segment of mild wall thickening versus artifact from underdistention, could be related to muscular hypertrophy or prior diverticulitis; no acute pericolic infiltrate changes identified to suggest  acute diverticulitis.  Tiny nonobstructing right renal calculus.  Umbilical hernia containing fat.  Incompletely distended stomach, unable to exclude wall thickening at proximal to mid stomach.  Probable uterine leiomyoma.  SIGNATURE  Electronically Signed  By: Ulyses Southward M.D.  On: 05/23/2013 11:09           Vitals     Height Weight BMI (  Calculated)     (1.626 m) 189 lb (85.73 kg) 32.5      Interpretation Summary     CLINICAL DATA Abdominal discomfort, gas, history hypertension  EXAM CT ABDOMEN AND PELVIS WITH CONTRAST  TECHNIQUE Multidetector CT imaging of the abdomen and pelvis was performed using the standard protocol following bolus administration of intravenous contrast. Sagittal and coronal MPR images reconstructed from axial data set.  CONTRAST OMNIPAQUE IOHEXOL 300 MG/ML SOLN. Dilute oral contrast.  COMPARISON 04/13/2003 CT abdomen  FINDINGS Minimal dependent atelectasis right lower lobe these.  Umbilical hernia containing fat.  9 mm cyst laterally left kidney image 35.  Tiny nonobstructing calculus mid right kidney image 39.  Artifacts from dense contrast in right colon traverse right kidney.  Liver, spleen, pancreas, kidneys, and adrenal glands otherwise normal appearance.  Normal appendix.  Probable 3.0 x 2.5 cm diameter uterine leiomyoma image 65.  Unremarkable adnexae.  Atherosclerotic calcification at aortic bifurcation.  Scattered diverticulosis of distal descending and sigmoid colon with questionable sigmoid wall thickening versus artifact from underdistention.  Decompressed bladder.  Incomplete gastric distention unable to exclude gastric wall thickening at proximal to mid stomach.  Small bowel loops unremarkable.  No mass, adenopathy, free fluid, or definite inflammatory process.  Degenerative facet disease changes lower lumbar spine.  IMPRESSION Distal colonic  diverticulosis with questionable long segment of mild wall thickening versus artifact from underdistention, could be related to muscular hypertrophy or prior diverticulitis; no acute pericolic infiltrate changes identified to suggest acute diverticulitis.  Tiny nonobstructing right renal calculus.  Umbilical hernia containing fat.  Incompletely distended stomach, unable to exclude wall thickening at proximal to mid stomach.  Probable uterine leiomyoma.  SIGNATURE  Electronically Signed  By: Ulyses Southward M.D.  On: 05/23/2013 11:09      External Result Report     External Result Report     Imaging     Imaging Information     Signed by     Signed Date/Time   Phone Pager    Ulyses Southward 05/23/2013 11:09 AM [phone]      Exam Information     Status Exam Begun   Exam Ended      Final [99] 05/23/2013 10:16 AM 05/23/2013 10:46 AM      Signed     Electronically signed by Ulyses Southward, MD on 05/23/13 at 1109 EDT     Imaging Related Medications     Medication    iohexol (OMNIPAQUE) 300 MG/ML solution 100 mL    Route: Intravenous    Admin Amount: 100 mL    Volume: 100 mL    PRN Reason(s): contrast    Last Admin Time: 05/23/13 1028    Number of Expected Doses: 1      Most Recent Administration:    User Action Time Recorded Time Dose Route Site Comment Action Reason    Elaina Hoops 05/23/13 1028 05/23/13 1028 100 mL Intravenous   Contrast Given     Full Administration Report                  Original Order     Ordered On Ordered By      05/23/2013 6:17 AM Eduard Clos, MD             PACS Images     Show images for CT Abdomen Pelvis W Contrast    CT Abdomen Pelvis W Contrast (Order [id number])  Imaging  Order: [id number]   Date: 05/23/2013  Department: [facility]  Released By/Authorizing: Eduard Clos, MD  (auto-released)       Order Information     Order Date/Time Release Date/Time Start Date/Time End Date/Time    05/23/13 06:17 AM 05/23/13 06:17 AM 05/23/13 06:18 AM 05/23/13 06:18 AM      Order Details     Frequency Duration Priority Order Class    1 time imaging 1 occurrence STAT Hospital Performed      Order Questions     Question Answer Comment    Exam reason Abdominal discomfort.     Note:  Enter reason for exam    Does the patient have a contrast media/X-ray dye allergy? No     Note:  If yes, STOP and contact a CT Technologist, [facility]      Order History  Inpatient    Date/Time Action Taken User Additional Information    0000 Result Elaina Hoops In process    05/23/13 1610 Release Eduard Clos, MD (auto-released) [phone]    05/23/13 1109 Result Rad Results In Interface Final      Imaging CC Recipients       Collection Information     Resulting Agency: [facility]       Order Provider Info       Office phone Pager/beeper E-mail    Ordering User Eduard Clos, MD [phone] -- [email or web address]    Authorizing Provider Eduard Clos, MD [phone] -- [email or web address]    Billing Provider Ulyses Southward, MD [phone] [email or web address]      Reprint Requisition     CT Abdomen Pelvis W Contrast (Order #[id number]) on 05/23/13     Original Order     Ordered On Ordered By      05/23/2013 6:17 AM Eduard Clos, MD

## 2015-01-18 NOTE — Patient Instructions (Signed)
I am referring you to the gastroenterologist.  If you are worsening, please let me know.

## 2015-01-19 LAB — COMPLETE METABOLIC PANEL WITH GFR
ALT: 16 U/L (ref 6–29)
AST: 24 U/L (ref 10–35)
Albumin: 4 g/dL (ref 3.6–5.1)
Alkaline Phosphatase: 54 U/L (ref 33–130)
BUN: 14 mg/dL (ref 7–25)
CO2: 26 mmol/L (ref 20–31)
Calcium: 9 mg/dL (ref 8.6–10.4)
Chloride: 102 mmol/L (ref 98–110)
Creat: 0.89 mg/dL (ref 0.50–0.99)
GFR, Est African American: 80 mL/min (ref >=60)
GFR, Est Non African American: 69 mL/min (ref >=60)
Glucose, Bld: 80 mg/dL (ref 65–99)
Potassium: 3.7 mmol/L (ref 3.5–5.3)
Sodium: 139 mmol/L (ref 135–146)
Total Bilirubin: 1.5 mg/dL — ABNORMAL HIGH (ref 0.2–1.2)
Total Protein: 7 g/dL (ref 6.1–8.1)

## 2015-01-20 ENCOUNTER — Telehealth: Payer: Self-pay | Admitting: Family Medicine

## 2015-01-20 NOTE — Telephone Encounter (Signed)
Pt called again regarding issue mentioned in previous message. Her BP reading is 190/113. I encouraged pt to come into the clinic if this worsens or if she is feeling very ill.

## 2015-01-20 NOTE — Telephone Encounter (Signed)
Patient states that she was seen on 01/18/2015 for high BP. She states that she was given a Clonodin?? (a little orange pill) and that helped it to go down. She states that her BP has skyrocketed again today. She is requesting this medication be called to her pharmacy. I encouraged patient to come in and be seen or go to the ER if she is unable to wait for a return phone call.  [phone]

## 2015-01-21 ENCOUNTER — Ambulatory Visit (INDEPENDENT_AMBULATORY_CARE_PROVIDER_SITE_OTHER): Admitting: Family Medicine

## 2015-01-21 VITALS — BP 140/86 | HR 66 | Temp 97.8°F | Resp 20 | Ht 60.0 in | Wt 186.0 lb

## 2015-01-21 DIAGNOSIS — I10 Essential (primary) hypertension: Secondary | ICD-10-CM | POA: Diagnosis not present

## 2015-01-21 MED ORDER — AMLODIPINE BESYLATE 10 MG PO TABS: 10.0000 mg | ORAL_TABLET | Freq: Every day | ORAL | Status: DC

## 2015-01-21 NOTE — Patient Instructions (Addendum)
Drink plenty of fluids and avoid excessive salt  Increase amlodipine to 10 mg daily. A new prescription has been sent in on this.  Take the bystolic 5 mg daily   Complete your course of metronidazole  Contact the [facility] re: referral has been sent in by Dr. Milus GlazierLauenstein so you should check and see if they have set up an appointment yet. If you have problems call Dr. Elbert EwingsL back. The CT scan did show you have some diverticulosis.  In the long run it is beneficial if you could work on trying to lose some weight in order to better control your blood pressure.  Plan to follow-up with Dr. Elbert EwingsL or your primary care or cardiologist in about 1 month regarding her blood pressure, sooner if it is not doing better.

## 2015-01-21 NOTE — Telephone Encounter (Signed)
Left vmail.Advised that if BP is in fact 190/113 should RTC or go to ER.

## 2015-01-21 NOTE — Telephone Encounter (Signed)
She was advised to come in. Please advise.

## 2015-01-21 NOTE — Progress Notes (Signed)
Patient ID: [name], female    DOB: 09/28/1950  Age: 64 y.o. MRN: [id number]  Chief Complaint  Patient presents with  . Hypertension    yesterday and today     Subjective:   64 year old lady who was here a few days ago. She is back because her blood pressure was running high at home. It is a little difficult to assess her number because she had some old clonidine at home she took earlier. She feels very fatigued from that and wondered whether the medicine can make her tired. She is taking the metronidazole for her bowels. She has been referred to a gastroenterologist but has not heard from them yet. In review of the record it appears that referrals tried to contact her but could not reach her yesterday. The referral has already been sent in to the gastroenterology office. I told patient to contact them to find out when the appointment with me. She has some gurgling and discomfort across the upper abdomen primarily. No nausea or vomiting. Some loose stools. She is not having any cardiovascular symptoms. She does have a cardiologist and primary care physician. She has been tried on numerous blood pressure medicines in the past. She is currently taking Bystolic and amlodipine, 5 mg each.  Current allergies, medications, problem list, past/family and social histories reviewed.  Objective:  BP 140/86 mmHg  Pulse 66  Temp(Src) 97.8 F (36.6 C) (Oral)  Resp 20  Ht 5' (1.524 m)  Wt 186 lb (84.369 kg)  BMI 36.33 kg/m2  SpO2 98%  Obese female no major acute distress. Chest clear. Heart regular without murmurs gallops or arrhythmias. Abdomen has relatively normal bowel sounds. Soft without organomegaly, masses, but mildly tender across the epigastric region. No ankle edema. Blood pressure was rechecked at 158/86.  Assessment & Plan:   Assessment: 1. Essential hypertension       Plan: Discussed weight loss. Discussed salt reduction. Increase amlodipine to 10 mg daily. She  apparently has not done well with hydrochlorothiazide in the past. Continue the same Bystolic dose since it was just changed a few days ago. Monitor blood pressure. If stable, can about a month or sooner primary care cardiologist.  She is to check on her gastroenterology appointment if she doesn't hear soon.     Patient Instructions  Drink plenty of fluids and avoid excessive salt  Increase amlodipine to 10 mg daily. A new prescription has been sent in on this.  Take the bystolic 5 mg daily   Complete your course of metronidazole  Contact the [facility] re: referral has been sent in by Dr. Milus GlazierLauenstein so you should check and see if they have set up an appointment yet. If you have problems call Dr. Elbert EwingsL back. The CT scan did show you have some diverticulosis.  In the long run it is beneficial if you could work on trying to lose some weight in order to better control your blood pressure.  Plan to follow-up with Dr. Elbert EwingsL or your primary care or cardiologist in about 1 month regarding her blood pressure, sooner if it is not doing better.     No Follow-up on file.   HOPPER,DAVID, MD 01/21/2015

## 2015-01-23 ENCOUNTER — Encounter: Payer: Self-pay | Admitting: Physician Assistant

## 2015-01-24 ENCOUNTER — Encounter (HOSPITAL_COMMUNITY): Payer: Self-pay

## 2015-01-24 ENCOUNTER — Telehealth: Payer: Self-pay

## 2015-01-24 ENCOUNTER — Emergency Department (HOSPITAL_COMMUNITY)
Admission: EM | Admit: 2015-01-24 | Discharge: 2015-01-24 | Disposition: A | Discharge status: Home / Self Care | Attending: Emergency Medicine | Admitting: Emergency Medicine

## 2015-01-24 DIAGNOSIS — Z880 Allergy status to penicillin: Secondary | ICD-10-CM | POA: Diagnosis not present

## 2015-01-24 DIAGNOSIS — R197 Diarrhea, unspecified: Secondary | ICD-10-CM

## 2015-01-24 DIAGNOSIS — R109 Unspecified abdominal pain: Secondary | ICD-10-CM | POA: Insufficient documentation

## 2015-01-24 DIAGNOSIS — Z8719 Personal history of other diseases of the digestive system: Secondary | ICD-10-CM | POA: Diagnosis not present

## 2015-01-24 DIAGNOSIS — Z8673 Personal history of transient ischemic attack (TIA), and cerebral infarction without residual deficits: Secondary | ICD-10-CM | POA: Insufficient documentation

## 2015-01-24 DIAGNOSIS — Z8659 Personal history of other mental and behavioral disorders: Secondary | ICD-10-CM | POA: Diagnosis not present

## 2015-01-24 DIAGNOSIS — Z8739 Personal history of other diseases of the musculoskeletal system and connective tissue: Secondary | ICD-10-CM | POA: Diagnosis not present

## 2015-01-24 DIAGNOSIS — I159 Secondary hypertension, unspecified: Secondary | ICD-10-CM | POA: Diagnosis not present

## 2015-01-24 DIAGNOSIS — Z8639 Personal history of other endocrine, nutritional and metabolic disease: Secondary | ICD-10-CM | POA: Diagnosis not present

## 2015-01-24 DIAGNOSIS — Z8619 Personal history of other infectious and parasitic diseases: Secondary | ICD-10-CM | POA: Insufficient documentation

## 2015-01-24 DIAGNOSIS — I10 Essential (primary) hypertension: Secondary | ICD-10-CM | POA: Diagnosis not present

## 2015-01-24 LAB — COMPREHENSIVE METABOLIC PANEL
ALT: 31 U/L (ref 14–54)
ANION GAP: 7 (ref 5–15)
AST: 42 U/L — ABNORMAL HIGH (ref 15–41)
Albumin: 4.3 g/dL (ref 3.5–5.0)
Alkaline Phosphatase: 51 U/L (ref 38–126)
BUN: 20 mg/dL (ref 6–20)
CHLORIDE: 103 mmol/L (ref 101–111)
CO2: 28 mmol/L (ref 22–32)
Calcium: 9.4 mg/dL (ref 8.9–10.3)
Creatinine, Ser: 1.12 mg/dL — ABNORMAL HIGH (ref 0.44–1.00)
GFR calc non Af Amer: 51 mL/min — ABNORMAL LOW (ref >60)
GFR, EST AFRICAN AMERICAN: 59 mL/min — AB (ref >60)
Glucose, Bld: 94 mg/dL (ref 65–99)
POTASSIUM: 3.4 mmol/L — AB (ref 3.5–5.1)
SODIUM: 138 mmol/L (ref 135–145)
Total Bilirubin: 1.6 mg/dL — ABNORMAL HIGH (ref 0.3–1.2)
Total Protein: 7.5 g/dL (ref 6.5–8.1)

## 2015-01-24 LAB — URINALYSIS, ROUTINE W REFLEX MICROSCOPIC
Bilirubin Urine: NEGATIVE
GLUCOSE, UA: NEGATIVE mg/dL
Hgb urine dipstick: NEGATIVE
Ketones, ur: NEGATIVE mg/dL
Nitrite: NEGATIVE
PH: 6 (ref 5.0–8.0)
PROTEIN: NEGATIVE mg/dL
Specific Gravity, Urine: 1.017 (ref 1.005–1.030)
Urobilinogen, UA: 0.2 mg/dL (ref 0.0–1.0)

## 2015-01-24 LAB — CBC
HCT: 43.9 % (ref 36.0–46.0)
HEMOGLOBIN: 15 g/dL (ref 12.0–15.0)
MCH: 31.2 pg (ref 26.0–34.0)
MCHC: 34.2 g/dL (ref 30.0–36.0)
MCV: 91.3 fL (ref 78.0–100.0)
Platelets: 208 10*3/uL (ref 150–400)
RBC: 4.81 MIL/uL (ref 3.87–5.11)
RDW: 12.9 % (ref 11.5–15.5)
WBC: 8.2 10*3/uL (ref 4.0–10.5)

## 2015-01-24 LAB — LIPASE, BLOOD: LIPASE: 23 U/L (ref 11–51)

## 2015-01-24 LAB — C DIFFICILE QUICK SCREEN W PCR REFLEX
C DIFFICILE (CDIFF) TOXIN: NEGATIVE
C DIFFICLE (CDIFF) ANTIGEN: NEGATIVE
C Diff interpretation: NEGATIVE

## 2015-01-24 LAB — URINE MICROSCOPIC-ADD ON

## 2015-01-24 NOTE — Discharge Instructions (Signed)
Please follow-up with primary care doctor regarding elevated blood pressure. Your stool cultures are pending and we will call you if they return abnormal.   Diarrhea Diarrhea is frequent loose and watery bowel movements. It can cause you to feel weak and dehydrated. Dehydration can cause you to become tired and thirsty, have a dry mouth, and have decreased urination that often is dark yellow. Diarrhea is a sign of another problem, most often an infection that will not last long. In most cases, diarrhea typically lasts 2-3 days. However, it can last longer if it is a sign of something more serious. It is important to treat your diarrhea as directed by your caregiver to lessen or prevent future episodes of diarrhea. CAUSES  Some common causes include:  Gastrointestinal infections caused by viruses, bacteria, or parasites.  Food poisoning or food allergies.  Certain medicines, such as antibiotics, chemotherapy, and laxatives.  Artificial sweeteners and fructose.  Digestive disorders. HOME CARE INSTRUCTIONS  Ensure adequate fluid intake (hydration): Have 1 cup (8 oz) of fluid for each diarrhea episode. Avoid fluids that contain simple sugars or sports drinks, fruit juices, whole milk products, and sodas. Your urine should be clear or pale yellow if you are drinking enough fluids. Hydrate with an oral rehydration solution that you can purchase at pharmacies, retail stores, and online. You can prepare an oral rehydration solution at home by mixing the following ingredients together:   - tsp table salt.   tsp baking soda.   tsp salt substitute containing potassium chloride.  1  tablespoons sugar.  1 L (34 oz) of water.  Certain foods and beverages may increase the speed at which food moves through the gastrointestinal (GI) tract. These foods and beverages should be avoided and include:  Caffeinated and alcoholic beverages.  High-fiber foods, such as raw fruits and vegetables, nuts,  seeds, and whole grain breads and cereals.  Foods and beverages sweetened with sugar alcohols, such as xylitol, sorbitol, and mannitol.  Some foods may be well tolerated and may help thicken stool including:  Starchy foods, such as rice, toast, pasta, low-sugar cereal, oatmeal, grits, baked potatoes, crackers, and bagels.  Bananas.  Applesauce.  Add probiotic-rich foods to help increase healthy bacteria in the GI tract, such as yogurt and fermented milk products.  Wash your hands well after each diarrhea episode.  Only take over-the-counter or prescription medicines as directed by your caregiver.  Take a warm bath to relieve any burning or pain from frequent diarrhea episodes. SEEK IMMEDIATE MEDICAL CARE IF:   You are unable to keep fluids down.  You have persistent vomiting.  You have blood in your stool, or your stools are black and tarry.  You do not urinate in 6-8 hours, or there is only a small amount of very dark urine.  You have abdominal pain that increases or localizes.  You have weakness, dizziness, confusion, or light-headedness.  You have a severe headache.  Your diarrhea gets worse or does not get better.  You have a fever or persistent symptoms for more than 2-3 days.  You have a fever and your symptoms suddenly get worse. MAKE SURE YOU:   Understand these instructions.  Will watch your condition.  Will get help right away if you are not doing well or get worse.   This information is not intended to replace advice given to you by your health care provider. Make sure you discuss any questions you have with your health care provider.   Document Released:  02/19/2002 Document Revised: 03/22/2014 Document Reviewed: 11/07/2011 Elsevier Interactive Patient Education 2016 [employer].  Hypertension Hypertension, commonly called high blood pressure, is when the force of blood pumping through your arteries is too strong. Your arteries are the blood vessels  that carry blood from your heart throughout your body. A blood pressure reading consists of a higher number over a lower number, such as 110/72. The higher number (systolic) is the pressure inside your arteries when your heart pumps. The lower number (diastolic) is the pressure inside your arteries when your heart relaxes. Ideally you want your blood pressure below 120/80. Hypertension forces your heart to work harder to pump blood. Your arteries may become narrow or stiff. Having untreated or uncontrolled hypertension can cause heart attack, stroke, kidney disease, and other problems. RISK FACTORS Some risk factors for high blood pressure are controllable. Others are not.  Risk factors you cannot control include:   Race. You may be at higher risk if you are African American.  Age. Risk increases with age.  Gender. Men are at higher risk than women before age 60 years. After age 44, women are at higher risk than men. Risk factors you can control include:  Not getting enough exercise or physical activity.  Being overweight.  Getting too much fat, sugar, calories, or salt in your diet.  Drinking too much alcohol. SIGNS AND SYMPTOMS Hypertension does not usually cause signs or symptoms. Extremely high blood pressure (hypertensive crisis) may cause headache, anxiety, shortness of breath, and nosebleed. DIAGNOSIS To check if you have hypertension, your health care provider will measure your blood pressure while you are seated, with your arm held at the level of your heart. It should be measured at least twice using the same arm. Certain conditions can cause a difference in blood pressure between your right and left arms. A blood pressure reading that is higher than normal on one occasion does not mean that you need treatment. If it is not clear whether you have high blood pressure, you may be asked to return on a different day to have your blood pressure checked again. Or, you may be asked to  monitor your blood pressure at home for 1 or more weeks. TREATMENT Treating high blood pressure includes making lifestyle changes and possibly taking medicine. Living a healthy lifestyle can help lower high blood pressure. You may need to change some of your habits. Lifestyle changes may include:  Following the DASH diet. This diet is high in fruits, vegetables, and whole grains. It is low in salt, red meat, and added sugars.  Keep your sodium intake below 2,300 mg per day.  Getting at least 30-45 minutes of aerobic exercise at least 4 times per week.  Losing weight if necessary.  Not smoking.  Limiting alcoholic beverages.  Learning ways to reduce stress. Your health care provider may prescribe medicine if lifestyle changes are not enough to get your blood pressure under control, and if one of the following is true:  You are 26-26 years of age and your systolic blood pressure is above 140.  You are 55 years of age or older, and your systolic blood pressure is above 150.  Your diastolic blood pressure is above 90.  You have diabetes, and your systolic blood pressure is over 140 or your diastolic blood pressure is over 90.  You have kidney disease and your blood pressure is above 140/90.  You have heart disease and your blood pressure is above 140/90. Your personal target blood  pressure may vary depending on your medical conditions, your age, and other factors. HOME CARE INSTRUCTIONS  Have your blood pressure rechecked as directed by your health care provider.   Take medicines only as directed by your health care provider. Follow the directions carefully. Blood pressure medicines must be taken as prescribed. The medicine does not work as well when you skip doses. Skipping doses also puts you at risk for problems.  Do not smoke.   Monitor your blood pressure at home as directed by your health care provider. SEEK MEDICAL CARE IF:   You think you are having a reaction to  medicines taken.  You have recurrent headaches or feel dizzy.  You have swelling in your ankles.  You have trouble with your vision. SEEK IMMEDIATE MEDICAL CARE IF:  You develop a severe headache or confusion.  You have unusual weakness, numbness, or feel faint.  You have severe chest or abdominal pain.  You vomit repeatedly.  You have trouble breathing. MAKE SURE YOU:   Understand these instructions.  Will watch your condition.  Will get help right away if you are not doing well or get worse.   This information is not intended to replace advice given to you by your health care provider. Make sure you discuss any questions you have with your health care provider.   Document Released: 03/01/2005 Document Revised: 07/16/2014 Document Reviewed: 12/22/2012 Elsevier Interactive Patient Education [employer]2016 Elsevier Inc.

## 2015-01-24 NOTE — Telephone Encounter (Signed)
Patient states that her BP was 165/103 on 01/23/2015 and 179/113 on 01/24/2015. Patient is unsure how to handle this. Advised patient to come to walk in center or go to the ER.   [phone]

## 2015-01-24 NOTE — ED Notes (Signed)
Pt c/o intermittent abdominal pain and diarrhea x 3 weeks.  Pain score 2/10.  Pt reports "it feels like a gurgly feeling."   Hx of diverticulitis.

## 2015-01-24 NOTE — ED Provider Notes (Signed)
CSN: [id number]     Arrival date & time 01/24/15  1627 History   First MD Initiated Contact with Patient 01/24/15 2137     Chief Complaint  Patient presents with  . Abdominal Pain  . Diarrhea     (Consider location/radiation/quality/duration/timing/severity/associated sxs/prior Treatment) HPI [name] is a 64 y.o. female with history of hypertension, stroke, depression, presents to emergency department complaining of diarrhea and elevated blood pressure. Patient states that she has had diarrhea and abdominal cramping for about 3 weeks. She has seen her primary care doctor 3 times, states had x-rays and CT abdomen and pelvis done which showed diverticulosis. She reports finishing a course of Flagyl. Patient states she continues to have intermittent diarrhea. Denies any blood in her stool. She states she is not longer having any abdominal pain just "bubbly feeling." She states the main reason for her visit to emergency department as her blood pressure has been elevated in the last several days. Patient states that she had some medications doses adjusted on her last visit to primary care doctor. She is compliant with all of her medications. She states at home her blood pressure was 190/110. She is followed by cardiologist and primary care doctor for her difficult to control blood pressure. She denies any chest pain or headache. Denies any swelling in extremities. No other complaints.  Past Medical History  Diagnosis Date  . Hypertension   . Arthritis   . Depression   . Seasonal allergies   . Stroke (HCC) 09/2008  . Chicken pox   . Diverticulitis   . Thyroid disease    Past Surgical History  Procedure Laterality Date  . Wisdom tooth extraction    . Minor breast biopsy  1979   Family History  Problem Relation Age of Onset  . Heart failure Mother   . Heart disease Mother   . Diabetes Mother   . Hypertension Mother   . Hypertension Father   . Hypertension Sister   .  Hypertension Brother   . Cancer Brother     Prostate  . Hypertension Sister   . Diabetes Maternal Grandmother    Social History  Substance Use Topics  . Smoking status: Never Smoker   . Smokeless tobacco: None  . Alcohol Use: Yes     Comment: Social Use only   OB History    Gravida Para Term Preterm AB TAB SAB Ectopic Multiple Living   2         2     Review of Systems  Constitutional: Negative for fever and chills.  Respiratory: Negative for cough, chest tightness and shortness of breath.   Cardiovascular: Negative for chest pain, palpitations and leg swelling.  Gastrointestinal: Positive for diarrhea. Negative for nausea, vomiting and abdominal pain.  Genitourinary: Negative for dysuria, flank pain and pelvic pain.  Musculoskeletal: Negative for myalgias, arthralgias, neck pain and neck stiffness.  Skin: Negative for rash.  Neurological: Negative for dizziness, weakness and headaches.  All other systems reviewed and are negative.     Allergies  Penicillins and Sulfa antibiotics  Home Medications   Prior to Admission medications   Medication Sig Start Date End Date Taking? Authorizing Provider  amLODipine (NORVASC) 10 MG tablet Take 1 tablet (10 mg total) by mouth daily. 01/21/15  Yes Peyton Najjar, MD  Ascorbic Acid (VITAMIN C) 1000 MG tablet Take 1,000 mg by mouth every morning.    Yes Historical Provider, MD  B Complex-C (B-COMPLEX WITH VITAMIN C) tablet Take  1 tablet by mouth daily.     Yes Historical Provider, MD  Cholecalciferol (VITAMIN D PO) Take 1,000 Units by mouth daily.   Yes Historical Provider, MD  Coenzyme Q10 (CO Q-10) 200 MG CAPS Take 1 capsule by mouth daily.    Yes Historical Provider, MD  Iodine, Kelp, (KELP PO) Take by mouth. Take 1 teaspoon by mouth daily   Yes Historical Provider, MD  LevOCARNitine (L-CARNITINE PO) Take 800 mg by mouth every morning.    Yes Historical Provider, MD  metroNIDAZOLE (FLAGYL) 500 MG tablet Take 1 tablet (500 mg total)  by mouth 2 (two) times daily with a meal. DO NOT CONSUME ALCOHOL WHILE TAKING THIS MEDICATION. 01/18/15  Yes Elvina SidleKurt Lauenstein, MD  MULTIPLE VITAMIN PO Take 1 tablet by mouth daily.    Yes Historical Provider, MD  NATTOKINASE PO Take 1 tablet by mouth every morning. Helps blood flow   Yes Historical Provider, MD  nebivolol (BYSTOLIC) 5 MG tablet Take 1 tablet (5 mg total) by mouth daily. 01/18/15  Yes Elvina SidleKurt Lauenstein, MD  NON FORMULARY Barley Green:  Takes 1 tsp daily   Yes Historical Provider, MD  NON FORMULARY Chlorella:  Takes 15 capsules daily   Yes Historical Provider, MD  POTASSIUM PO Take 1 tablet by mouth daily.   Yes Historical Provider, MD  Probiotic Product (PROBIOTIC PO) Take 1 tablet by mouth daily. Bifido Beadlet 50+   Yes Historical Provider, MD  SPIRULINA PO Take 1 teaspoon by mouth daily   Yes Historical Provider, MD  amLODipine (NORVASC) 5 MG tablet Take 1 tablet (5 mg total) by mouth daily. Patient not taking: Reported on 01/24/2015 09/03/14   Rollene RotundaJames Hochrein, MD   BP 140/100 mmHg  Pulse 63  Temp(Src) 97.6 F (36.4 C) (Oral)  Resp 16  SpO2 100% Physical Exam  Constitutional: She is oriented to person, place, and time. She appears well-developed and well-nourished. No distress.  HENT:  Head: Normocephalic.  Eyes: Conjunctivae are normal.  Neck: Neck supple.  Cardiovascular: Normal rate, regular rhythm and normal heart sounds.   Pulmonary/Chest: Effort normal and breath sounds normal. No respiratory distress. She has no wheezes. She has no rales.  Abdominal: Soft. Bowel sounds are normal. She exhibits no distension. There is no tenderness. There is no rebound and no guarding.  Musculoskeletal: She exhibits no edema.  Neurological: She is alert and oriented to person, place, and time. No cranial nerve deficit. Coordination normal.  Skin: Skin is warm and dry.  Psychiatric: She has a normal mood and affect. Her behavior is normal.  Nursing note and vitals reviewed.   ED  Course  Procedures (including critical care time) Labs Review Labs Reviewed  COMPREHENSIVE METABOLIC PANEL - Abnormal; Notable for the following:    Potassium 3.4 (*)    Creatinine, Ser 1.12 (*)    AST 42 (*)    Total Bilirubin 1.6 (*)    GFR calc non Af Amer 51 (*)    GFR calc Af Amer 59 (*)    All other components within normal limits  URINALYSIS, ROUTINE W REFLEX MICROSCOPIC ([facility]) - Abnormal; Notable for the following:    Leukocytes, UA SMALL (*)    All other components within normal limits  URINE MICROSCOPIC-ADD ON - Abnormal; Notable for the following:    Crystals CA OXALATE CRYSTALS (*)    All other components within normal limits  C DIFFICILE QUICK SCREEN W PCR REFLEX  STOOL CULTURE  LIPASE, BLOOD  CBC  Imaging Review No results found. I have personally reviewed and evaluated these images and lab results as part of my medical decision-making.   EKG Interpretation None      MDM   Final diagnoses:  Diarrhea, unspecified type  Secondary hypertension, unspecified    patient's emergency department complaining of diarrhea and elevated blood pressure. Her abdomen is benign and is nontender. Given she has had diarrhea for 3 weeks now will get stool cultures and send sample for C. difficile. Patient's blood pressure in emergency department is only mildly elevated. 158/87. She is asymptomatic. Blood work unremarkable except for mildly low potassium of 3.4. Creatinine 1.12. AST 42. Discussed with Dr. Rhunette Croft. Home with PCP and cardiology follow up regarding BP. Return precautions discussed with pt who voiced understanding.   Filed Vitals:   01/24/15 1648 01/24/15 2156 01/24/15 2314  BP: 158/87 140/100 152/86  Pulse: 63  56  Temp: 97.6 F (36.4 C)  98.1 F (36.7 C)  TempSrc: Oral  Oral  Resp: 16  16  SpO2: 100%  99%     Jaynie Crumble, PA-C 01/25/15 0115  Derwood Kaplan, MD 01/25/15 0134

## 2015-01-24 NOTE — Telephone Encounter (Signed)
Pt being seen in ED now. 

## 2015-01-28 LAB — STOOL CULTURE

## 2015-01-30 ENCOUNTER — Ambulatory Visit: Admitting: Physician Assistant

## 2015-02-04 ENCOUNTER — Ambulatory Visit (INDEPENDENT_AMBULATORY_CARE_PROVIDER_SITE_OTHER): Admitting: Gastroenterology

## 2015-02-04 VITALS — BP 146/88 | HR 90 | Ht 62.5 in | Wt 186.0 lb

## 2015-02-04 DIAGNOSIS — Z1211 Encounter for screening for malignant neoplasm of colon: Secondary | ICD-10-CM

## 2015-02-04 DIAGNOSIS — R194 Change in bowel habit: Secondary | ICD-10-CM

## 2015-02-04 NOTE — Patient Instructions (Signed)
We have sent your demographic and insurance information to [facility]. They should contact you within the next week regarding your Cologuard (colon cancer screening) test. If you have not heard from them within the next week, please call our office at [phone]. 

## 2015-03-05 ENCOUNTER — Encounter: Payer: Self-pay | Admitting: Gastroenterology

## 2015-03-05 DIAGNOSIS — R194 Change in bowel habit: Secondary | ICD-10-CM | POA: Insufficient documentation

## 2015-03-05 DIAGNOSIS — Z1211 Encounter for screening for malignant neoplasm of colon: Secondary | ICD-10-CM | POA: Insufficient documentation

## 2015-03-05 NOTE — Progress Notes (Signed)
Agree with assessment and plan. Given bowel habit changes, nonspecific findings on CT, and lack of prior CRC screening, a colonoscopy is highly recommended at this time. Given she declines it at this time, Cologuard is an option for screening, however with her symptoms colonoscopy is strongly recommended.

## 2015-03-05 NOTE — Progress Notes (Addendum)
02/04/2015 [name] [id number] 01/01/1951   HISTORY OF PRESENT ILLNESS:  This is a 64 year old female who is new to our practice and presents to our office today at the request of her PCP, Dr. Cleta Albertsaub.  She comes in complaining of changes in bowel habits; change in shape and frequency.  Had two episodes of sharp, generalized abdominal pain.  She does not offer much detail regarding her symptoms.  Was in the ED for diarrhea and elevated BP on 11/11 at which time stool culture, Cdiff, lipase, CBC, and CMP were all normal/negative except a mildly elevated total bili at 1.6.  She seems to have a very alternative approach to healthcare, takes several vitamins and supplements.  Thinks that these GI issues are affecting her blood pressure and does not like to take "medicine".  She want to know how to "heal her digestive system and bring into balance".  Denies any rectal bleeding.  No weight loss, in fact says that she has gained some weight.  No family history of colon cancer.  Patient is opposed to colonoscopy so has never undergone one in the past.  Just of note, CT scan of the abdomen and pelvis with contrast from 05/2013 showed the following:  IMPRESSION Distal colonic diverticulosis with questionable long segment of mild wall thickening versus artifact from underdistention, could be related to muscular hypertrophy or prior diverticulitis; no acute pericolic infiltrate changes identified to suggest acute diverticulitis.  Tiny nonobstructing right renal calculus.  Umbilical hernia containing fat.  Incompletely distended stomach, unable to exclude wall thickening at proximal to mid stomach.  Probable uterine leiomyoma.   Past Medical History  Diagnosis Date  . Hypertension   . Arthritis   . Depression   . Seasonal allergies   . Stroke (HCC) 09/2008  . Chicken pox   . Diverticulitis   . Thyroid disease    Past Surgical History  Procedure Laterality Date  . Wisdom tooth  extraction    . Minor breast biopsy  1979    reports that she has never smoked. She does not have any smokeless tobacco history on file. She reports that she drinks alcohol. She reports that she does not use illicit drugs. family history includes Cancer in her brother; Diabetes in her maternal grandmother and mother; Heart disease in her mother; Heart failure in her mother; Hypertension in her brother, father, mother, sister, and sister. Allergies  Allergen Reactions  . Penicillins Swelling  . Sulfa Antibiotics Itching      Outpatient Encounter Prescriptions as of 02/04/2015  Medication Sig  . amLODipine (NORVASC) 10 MG tablet Take 1 tablet (10 mg total) by mouth daily.  Marland Kitchen. amLODipine (NORVASC) 5 MG tablet Take 1 tablet (5 mg total) by mouth daily.  . Ascorbic Acid (VITAMIN C) 1000 MG tablet Take 1,000 mg by mouth every morning.   . B Complex-C (B-COMPLEX WITH VITAMIN C) tablet Take 1 tablet by mouth daily.    . Cholecalciferol (VITAMIN D PO) Take 1,000 Units by mouth daily.  . Coenzyme Q10 (CO Q-10) 200 MG CAPS Take 1 capsule by mouth daily.   . Iodine, Kelp, (KELP PO) Take by mouth. Take 1 teaspoon by mouth daily  . LevOCARNitine (L-CARNITINE PO) Take 800 mg by mouth every morning.   . MULTIPLE VITAMIN PO Take 1 tablet by mouth daily.   Marland Kitchen. NATTOKINASE PO Take 1 tablet by mouth every morning. Helps blood flow  . nebivolol (BYSTOLIC) 5 MG tablet Take 1 tablet (  5 mg total) by mouth daily.  . NON FORMULARY Barley Green:  Takes 1 tsp daily  . NON FORMULARY Chlorella:  Takes 15 capsules daily  . POTASSIUM PO Take 1 tablet by mouth daily.  . Probiotic Product (PROBIOTIC PO) Take 1 tablet by mouth daily. Bifido Beadlet 50+  . SPIRULINA PO Take 1 teaspoon by mouth daily  . [DISCONTINUED] metroNIDAZOLE (FLAGYL) 500 MG tablet Take 1 tablet (500 mg total) by mouth 2 (two) times daily with a meal. DO NOT CONSUME ALCOHOL WHILE TAKING THIS MEDICATION.   No facility-administered encounter medications  on file as of 02/04/2015.     REVIEW OF SYSTEMS  : All other systems reviewed and negative except where noted in the History of Present Illness.   PHYSICAL EXAM: BP 146/88 mmHg  Pulse 90  Ht 5' 2.5" (1.588 m)  Wt 186 lb (84.369 kg)  BMI 33.46 kg/m2 General: Well developed black female in no acute distress Head: Normocephalic and atraumatic Eyes:  Sclerae anicteric, conjunctiva pink. Ears: Normal auditory acuity Lungs: Clear throughout to auscultation Heart: Regular rate and rhythm Abdomen: Soft, non-distended.  Normal bowel sounds.  Non-tender. Musculoskeletal: Symmetrical with no gross deformities  Skin: No lesions on visible extremities Extremities: No edema  Neurological: Alert oriented x 4, grossly non-focal Psychological:  Alert and cooperative. Normal mood and affect  ASSESSMENT AND PLAN: -Colon cancer screening:  Will order Cologuard as patient is declining/refusing colonoscopy.  Explained that if the test is positive then we will strongly recommend colonoscopy. -Change in bowel habits:  Change in shape and frequency.  Once again, patient declining colonoscopy.   CC:  Collene Gobble, MD

## 2015-03-06 ENCOUNTER — Telehealth: Payer: Self-pay | Admitting: *Deleted

## 2015-03-06 NOTE — Telephone Encounter (Signed)
-----   Message from Leta BaptistJessica D Zehr, PA-C sent at 03/05/2015  4:30 PM EST ----- I saw this patient on 11/22.  Will you please follow-up with her and see if she did the cologuard?  She declined colonoscopy so we ordered that instead but I do not recall ever seeing a result.  Thank you,  Jess

## 2015-03-06 NOTE — Telephone Encounter (Signed)
Unable to reach patient. Phone rings but no answering machine. Will try again later.

## 2015-03-07 NOTE — Telephone Encounter (Signed)
Left a message for patient to call back. 

## 2015-03-07 NOTE — Telephone Encounter (Signed)
Patient has not completed the Cologard yet. She will complete.

## 2015-04-08 ENCOUNTER — Ambulatory Visit: Admitting: Cardiology

## 2015-04-15 ENCOUNTER — Ambulatory Visit: Admitting: Internal Medicine

## 2015-04-18 ENCOUNTER — Other Ambulatory Visit: Payer: Self-pay | Admitting: Cardiology

## 2015-04-18 ENCOUNTER — Other Ambulatory Visit: Payer: Self-pay

## 2015-05-01 ENCOUNTER — Ambulatory Visit: Admitting: Cardiology

## 2015-05-05 ENCOUNTER — Other Ambulatory Visit: Payer: Self-pay

## 2015-05-05 DIAGNOSIS — I10 Essential (primary) hypertension: Secondary | ICD-10-CM

## 2015-05-05 MED ORDER — NEBIVOLOL HCL 5 MG PO TABS: 5.0000 mg | ORAL_TABLET | Freq: Every day | ORAL | Status: DC

## 2015-05-06 ENCOUNTER — Ambulatory Visit: Admitting: Internal Medicine

## 2015-05-15 ENCOUNTER — Other Ambulatory Visit: Payer: Self-pay

## 2015-05-15 DIAGNOSIS — I10 Essential (primary) hypertension: Secondary | ICD-10-CM

## 2015-05-15 MED ORDER — NEBIVOLOL HCL 5 MG PO TABS: 5.0000 mg | ORAL_TABLET | Freq: Every day | ORAL | Status: DC

## 2015-05-22 ENCOUNTER — Telehealth: Payer: Self-pay | Admitting: *Deleted

## 2015-05-22 NOTE — Telephone Encounter (Signed)
Forward to [employer]inda Reiland LPN authorization

## 2015-05-22 NOTE — Telephone Encounter (Signed)
Patient stated that she was informed by [facility] that they need a medical necessity form for the bystolic so that she can get it at a reduced copay. The number that she provided to call [facility] was [phone]. Please advise. Thanks, MI

## 2015-05-27 ENCOUNTER — Ambulatory Visit (INDEPENDENT_AMBULATORY_CARE_PROVIDER_SITE_OTHER): Admitting: Cardiology

## 2015-05-27 ENCOUNTER — Encounter: Payer: Self-pay | Admitting: Cardiology

## 2015-05-27 VITALS — BP 200/118 | HR 70 | Ht 63.0 in | Wt 186.2 lb

## 2015-05-27 DIAGNOSIS — I10 Essential (primary) hypertension: Secondary | ICD-10-CM

## 2015-05-27 MED ORDER — CLONIDINE HCL 0.1 MG PO TABS
0.0500 mg | ORAL_TABLET | Freq: Once | ORAL | Status: AC
  Administered 2015-05-27: 0.05 mg via ORAL

## 2015-05-27 NOTE — Patient Instructions (Signed)
Your physician wants you to follow-up in: 4 Months. You will receive a reminder letter in the mail two months in advance. If you don't receive a letter, please call our office to schedule the follow-up appointment.  

## 2015-05-27 NOTE — Telephone Encounter (Signed)
Left message for patient to call me back re: Furniture conservator/restorernsurance info.

## 2015-05-27 NOTE — Progress Notes (Signed)
Cardiology Office Note   Date:  05/27/2015   ID:  [name], DOB 03/13/1951, MRN [id number] PCP:  Lorretta HarpPANOSH,WANDA KOTVAN, MD  Cardiologist:   Rollene RotundaJames Danille Oppedisano, MD   No chief complaint on file.     History of Present Illness: [name] is a 65 y.o. female who presents for for evaluation of difficult to control hypertension.   She has been keeping a blood pressure diary and says that it is relatively well controlled at home.  However, it is very elevated today.  She has only tolerated 5 mg of Norvasc and was prescribed 10 mg.  She cannot afford the full dose of Bystolic which is the one drug that she can tolerate and that seems to work.  She does have sciatic pain today and was late to the appt having gone to there wrong office.  She is not having any chest pain or new SOB.  Past Medical History  Diagnosis Date  . Hypertension   . Arthritis   . Depression   . Seasonal allergies   . Stroke (HCC) 09/2008  . Chicken pox   . Diverticulitis   . Thyroid disease     Past Surgical History  Procedure Laterality Date  . Wisdom tooth extraction    . Minor breast biopsy  1979     Current Outpatient Prescriptions  Medication Sig Dispense Refill  . amLODipine (NORVASC) 5 MG tablet TAKE 1 TABLET DAILY 90 tablet 0  . Ascorbic Acid (VITAMIN C) 1000 MG tablet Take 1,000 mg by mouth every morning.     . B Complex-C (B-COMPLEX WITH VITAMIN C) tablet Take 1 tablet by mouth daily.      . Cholecalciferol (VITAMIN D PO) Take 1,000 Units by mouth daily.    . Coenzyme Q10 (CO Q-10) 200 MG CAPS Take 1 capsule by mouth daily.     . Iodine, Kelp, (KELP PO) Take by mouth. Take 1 teaspoon by mouth daily    . LevOCARNitine (L-CARNITINE PO) Take 800 mg by mouth every morning.     . MULTIPLE VITAMIN PO Take 1 tablet by mouth daily.     Marland Kitchen. NATTOKINASE PO Take 1 tablet by mouth every morning. Helps blood flow    . nebivolol (BYSTOLIC) 5 MG tablet Take 1 tablet (5 mg total) by mouth daily.  90 tablet 0  . NON FORMULARY Barley Green:  Takes 1 tsp daily    . NON FORMULARY Chlorella:  Takes 15 capsules daily    . POTASSIUM PO Take 1 tablet by mouth daily.    . Probiotic Product (PROBIOTIC PO) Take 1 tablet by mouth daily. Bifido Beadlet 50+    . SPIRULINA PO Take 1 teaspoon by mouth daily     No current facility-administered medications for this visit.    Allergies:   Penicillins and Sulfa antibiotics    ROS:  Please see the history of present illness.   Otherwise, review of systems are positive for none.   All other systems are reviewed and negative.    PHYSICAL EXAM: VS:  BP 200/118 mmHg  Pulse 70  Ht 5\' 3"  (1.6 m)  Wt 186 lb 3 oz (84.454 kg)  BMI 32.99 kg/m2 , BMI Body mass index is 32.99 kg/(m^2). GENERAL:  Well appearing NECK:  No jugular venous distention, waveform within normal limits, carotid upstroke brisk and symmetric, no bruits, no thyromegaly LUNGS:  Clear to auscultation bilaterally CHEST:  Unremarkable HEART:  PMI not displaced or sustained,S1 and S2 within  normal limits, no S3, no S4, no clicks, no rubs, no murmurs ABD:  Flat, positive bowel sounds normal in frequency in pitch, no bruits, no rebound, no guarding, no midline pulsatile mass, no hepatomegaly, no splenomegaly EXT:  2 plus pulses throughout, no edema, no cyanosis no clubbing   EKG:  EKG is ordered today. Sinus rhythm, rate 70, axis leftward, interventricular conduction delay, left ventricular hypertrophy by voltage criteria.   Recent Labs: 01/24/2015: ALT 31; BUN 20; Creatinine, Ser 1.12*; Hemoglobin 15.0; Platelets 208; Potassium 3.4*; Sodium 138    Lipid Panel    Component Value Date/Time   CHOL 150 05/24/2013 0317   TRIG 85 05/24/2013 0317   HDL 56 05/24/2013 0317   CHOLHDL 2.7 05/24/2013 0317   VLDL 17 05/24/2013 0317   LDLCALC 77 05/24/2013 0317      Wt Readings from Last 3 Encounters:  05/27/15 186 lb 3 oz (84.454 kg)  02/04/15 186 lb (84.369 kg)  01/21/15 186 lb  (84.369 kg)      Other studies Reviewed: Additional studies/ records that were reviewed today include: None  ASSESSMENT AND PLAN:  HTN:   She has been intolerant of many medications and has financial issues that make getting the Bystolic difficult.  She cannot afford a sleep study.  This makes it a difficult situation and she is going to have to tolerate some side effects.  Today in the office she was given clonidine and the BP came down to 156/80.  We gave her Bystolic samples.  She agrees to restart full dose Norvasc and Bystolic.  We have had a long discussion about this.      Current medicines are reviewed at length with the patient today.  The patient does not have concerns regarding medicines.  The following changes have been made:  As above  Labs/ tests ordered today include: None    Disposition:   FU with me in sx months    Signed, Rollene Rotunda, MD  05/27/2015 2:43 PM    [facility]

## 2015-05-30 NOTE — Telephone Encounter (Signed)
A tier exception has been submitted online for her Bystolic 5 mg.

## 2015-06-02 NOTE — Progress Notes (Signed)
Pre visit review using our clinic review tool, if applicable. No additional management support is needed unless otherwise documented below in the visit note.  Chief Complaint  Patient presents with  . Follow-up    has right sciatica    today     HPI: [name] 65 y.o.  multipl issue follow up. See last notes Resistant HT?   :   Has samples with  bystolic and amlodipine.    Had been better at home  130   Yesterday  Was  116.  At home.   Wrist monitor .  hasimotos  See dr Reece AgarG note  To do tfts q year  Last us  2015  Asks about mercury in broken filling  Has head cold  Congestion no fever Right sciatica botethering her numnbess at times no traum stretching some help  ROS: See pertinent positives and negatives per HPI.no current cp sob   Trying to do healthy thinks doesn feel anxious in office   Past Medical History  Diagnosis Date  . Hypertension   . Arthritis   . Depression   . Seasonal allergies   . Stroke (HCC) 09/2008  . Chicken pox   . Diverticulitis   . Thyroid disease     Family History  Problem Relation Age of Onset  . Heart failure Mother   . Heart disease Mother   . Diabetes Mother   . Hypertension Mother   . Hypertension Father   . Hypertension Sister   . Hypertension Brother   . Cancer Brother     Prostate  . Hypertension Sister   . Diabetes Maternal Grandmother     Social History   Social History  . Marital Status: Widowed    Spouse Name: N/A  . Number of Children: N/A  . Years of Education: N/A   Social History Main Topics  . Smoking status: Never Smoker   . Smokeless tobacco: None  . Alcohol Use: Yes     Comment: Social Use only  . Drug Use: No  . Sexual Activity: Not Asked   Other Topics Concern  . 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 amLODipine (NORVASC) 5 MG tablet TAKE 1 TABLET DAILY 90 tablet 0  . Ascorbic Acid (VITAMIN C) 1000 MG tablet Take 1,000 mg by mouth every morning.     . B Complex-C (B-COMPLEX WITH VITAMIN C) tablet Take 1 tablet by mouth daily.      . Cholecalciferol (VITAMIN D PO) Take 1,000 Units by mouth daily.    . Coenzyme Q10 (CO Q-10) 200 MG CAPS Take 1 capsule by mouth daily.     . Iodine, Kelp, (KELP PO) Take by mouth. Take 1 teaspoon by mouth daily    . LevOCARNitine (L-CARNITINE PO) Take 800 mg by mouth every morning.     . MULTIPLE VITAMIN PO Take 1 tablet by mouth daily.     Marland Kitchen. NATTOKINASE PO Take 1 tablet by mouth every morning. Helps blood flow    .  NON FORMULARY Barley Green:  Takes 1 tsp daily    . NON FORMULARY Chlorella:  Takes 15 capsules daily    . POTASSIUM PO Take 1 tablet by mouth daily.    . Probiotic Product (PROBIOTIC PO) Take 1 tablet by mouth daily. Bifido Beadlet 50+    . SPIRULINA PO Take 1 teaspoon by mouth daily    . nebivolol (BYSTOLIC) 5 MG tablet Take 1 tablet (5 mg total) by mouth daily. 90 tablet 0   No facility-administered medications prior to visit.     EXAM:  BP 152/90 mmHg  Temp(Src) 98.9 F (37.2 C) (Oral)  Wt 182 lb 12.8 oz (82.918 kg)  Body mass index is 32.39 kg/(m^2). Mild congestion non toxic  GENERAL: vitals reviewed and listed above, alert, oriented, appears well hydrated and in no acute distress HEENT: atraumatic, conjunctiva  clear, no obvious abnormalities on inspection of external nose and earstm clea mild congestions  OP : no lesion edema or exudate  NECK: no obvious masses on inspection palpation  LUNGS: clear to auscultation bilaterally, no wheezes, rales or rhonchi,  CV: HRRR, no clubbing cyanosis or min to no  peripheral edema nl cap refill  MS:  moves all extremities without noticeable focal  abnormality PSYCH: pleasant and cooperative, no obvious depression or anxiety Lab Results  Component Value Date   WBC 8.2 01/24/2015   HGB 15.0 01/24/2015   HCT 43.9 01/24/2015   PLT 208 01/24/2015   GLUCOSE 94 01/24/2015   CHOL 150 05/24/2013   TRIG 85 05/24/2013   HDL 56 05/24/2013   LDLCALC 77 05/24/2013   ALT 31 01/24/2015   AST 42* 01/24/2015   NA 138 01/24/2015   K 3.4* 01/24/2015   CL 103 01/24/2015   CREATININE 1.12* 01/24/2015   BUN 20 01/24/2015   CO2 28 01/24/2015   TSH 2.55 12/17/2013   INR 1.04 02/01/2011   HGBA1C 5.7* 05/23/2013   BP Readings from Last 3 Encounters:  06/03/15 152/90  05/27/15 200/118  02/04/15 146/88   bp readings take with office cuff and her monitor and correlea ASSESSMENT AND PLAN:  Discussed the following assessment and plan:  Essential hypertension - see card note   better at home   has wrist monitor here today 160 confirmed :monitor  memory shows 118 130 139/90  78 range on bystolic  - Plan: Basic metabolic panel, TSH, T4, free, Hepatic function panel  Hashimoto's disease - Plan: TSH, T4, free, Hepatic function panel  Right thyroid nodule - need fu  hastimotos  see dr Reece Agar note  get repaet US neck and follow - Plan: Basic metabolic panel, TSH, T4, free, Hepatic function panel  URI, acute - viral  sx rx for nowcaution with decongestants some help  Sciatic leg pain - inproving exercises stretching right leg  noweakness intermittent numbness   Last [name] 2015  Should repeat  Yearly tsh repeat lfts also  Disc White coat effect and continue monitor seems like bystolic a good fit for her  Taking 10 mg at this time. We dont have samples in our office  Fu with cards .  Do not think dental filling is related to her sx  -Patient advised to return or notify health care team  if symptoms worsen ,persist or new concerns arise.  Patient Instructions  Will notify you  of labs when available. Seems like  white coat effect in the office. bystolic seems to help you.  Continue   Home monitoring  Plan repeat ultrasound of neck to make sure thyroid nodule is not growing .   Continue exercises for the sciatica.   ROV in 6 months       Neta Mends. Panosh M.D.  Assessment & Plan:   Assessment: 1. Essential hypertension       Plan: Discussed weight loss. Discussed salt reduction. Increase amlodipine to 10 mg daily. She apparently has not done well with hydrochlorothiazide in the past. Continue the same Bystolic dose since it was just changed a few days ago. Monitor blood pressure. If stable, can about a month or sooner primary care cardiologist.  She is to check on her gastroenterology appointment if she doesn't hear soon.     Patient Instructions  Drink plenty of fluids and avoid excessive salt  Increase amlodipine to 10 mg daily. A new prescription has been sent in on this.  Take the bystolic 5 mg daily   Complete your course of metronidazole  Contact the [facility] re: referral has been sent in by Dr. Milus Glazier so you should check and see if they have set up an appointment yet. If you have problems call Dr. Elbert Ewings back. The CT scan did show you have some diverticulosis.  In the long run it is beneficial if you could work on trying to lose some weight in order to better control your blood pressure.  Plan to follow-up with Dr. Elbert Ewings or your primary care or cardiologist in about 1 month regarding her blood pressure, sooner if it is not doing better.         ASSESSMENT AND PLAN:  HTN: She has been intolerant of many medications and has financial issues that make getting the Bystolic difficult. She cannot afford a sleep study. This makes it a difficult situation and she is going to have to tolerate some side effects. Today in the office she was given clonidine and the BP came down to 156/80. We gave her Bystolic samples. She agrees to restart full dose  Norvasc and Bystolic. We have had a long discussion about this.     Current medicines are reviewed at length with the patient today. The patient does not have concerns regarding medicines.  The following changes have been made: As above  Labs/ tests ordered today include: None    Disposition: FU with me in sx months    Signed, Rollene Rotunda, MD  05/27/2015 2:43 PM  [facility]

## 2015-06-03 ENCOUNTER — Ambulatory Visit (INDEPENDENT_AMBULATORY_CARE_PROVIDER_SITE_OTHER): Admitting: Internal Medicine

## 2015-06-03 ENCOUNTER — Encounter: Payer: Self-pay | Admitting: Internal Medicine

## 2015-06-03 VITALS — BP 152/90 | Temp 98.9°F | Wt 182.8 lb

## 2015-06-03 DIAGNOSIS — M5430 Sciatica, unspecified side: Secondary | ICD-10-CM

## 2015-06-03 DIAGNOSIS — E041 Nontoxic single thyroid nodule: Secondary | ICD-10-CM | POA: Diagnosis not present

## 2015-06-03 DIAGNOSIS — I10 Essential (primary) hypertension: Secondary | ICD-10-CM | POA: Diagnosis not present

## 2015-06-03 DIAGNOSIS — J069 Acute upper respiratory infection, unspecified: Secondary | ICD-10-CM | POA: Diagnosis not present

## 2015-06-03 DIAGNOSIS — E063 Autoimmune thyroiditis: Secondary | ICD-10-CM

## 2015-06-03 LAB — HEPATIC FUNCTION PANEL
ALBUMIN: 4 g/dL (ref 3.5–5.2)
ALT: 17 U/L (ref 0–35)
AST: 29 U/L (ref 0–37)
Alkaline Phosphatase: 47 U/L (ref 39–117)
Bilirubin, Direct: 0.2 mg/dL (ref 0.0–0.3)
TOTAL PROTEIN: 6.8 g/dL (ref 6.0–8.3)
Total Bilirubin: 0.8 mg/dL (ref 0.2–1.2)

## 2015-06-03 LAB — T4, FREE: Free T4: 0.99 ng/dL (ref 0.60–1.60)

## 2015-06-03 LAB — BASIC METABOLIC PANEL
BUN: 14 mg/dL (ref 6–23)
CALCIUM: 8.8 mg/dL (ref 8.4–10.5)
CHLORIDE: 100 meq/L (ref 96–112)
CO2: 28 meq/L (ref 19–32)
CREATININE: 1.34 mg/dL — AB (ref 0.40–1.20)
GFR: 51.21 mL/min — AB (ref >60.00)
GLUCOSE: 90 mg/dL (ref 70–99)
Potassium: 3.4 mEq/L — ABNORMAL LOW (ref 3.5–5.1)
Sodium: 137 mEq/L (ref 135–145)

## 2015-06-03 LAB — TSH: TSH: 2.17 u[IU]/mL (ref 0.35–4.50)

## 2015-06-03 NOTE — Patient Instructions (Signed)
Will notify you  of labs when available. Seems like white coat effect in the office. bystolic seems to help you.  Continue   Home monitoring    Plan repeat ultrasound of neck to make sure thyroid nodule is not growing .   Continue exercises for the sciatica.   ROV in 6 months

## 2015-06-05 ENCOUNTER — Telehealth: Payer: Self-pay | Admitting: Cardiology

## 2015-06-05 NOTE — Telephone Encounter (Signed)
Patient calling the office for samples of medication: ° ° °1.  What medication and dosage are you requesting samples for? Bystolic 5mg ° °2.  Are you currently out of this medication? Yes ° ° °

## 2015-06-05 NOTE — Telephone Encounter (Signed)
Medication samples have been provided to the patient.  Drug name: bystolic 10mg   Qty: 21 (3 bottles)  LOT: [id number]  Exp.Date: 06/17  Samples left at front desk for patient pick-up. Patient notified.  Lindell Sparlkins, Kathrin Folden M 4:56 PM 06/05/2015   Patient would like to speak with Bonita QuinLinda, LPN about med assistance. Message routed

## 2015-06-11 ENCOUNTER — Encounter: Payer: Self-pay | Admitting: Family Medicine

## 2015-06-25 ENCOUNTER — Emergency Department (HOSPITAL_COMMUNITY)
Admission: EM | Admit: 2015-06-25 | Discharge: 2015-06-26 | Disposition: A | Discharge status: Home / Self Care | Attending: Emergency Medicine | Admitting: Emergency Medicine

## 2015-06-25 ENCOUNTER — Encounter (HOSPITAL_COMMUNITY): Payer: Self-pay | Admitting: Emergency Medicine

## 2015-06-25 DIAGNOSIS — I10 Essential (primary) hypertension: Secondary | ICD-10-CM | POA: Insufficient documentation

## 2015-06-25 DIAGNOSIS — Z8673 Personal history of transient ischemic attack (TIA), and cerebral infarction without residual deficits: Secondary | ICD-10-CM | POA: Insufficient documentation

## 2015-06-25 DIAGNOSIS — Z8719 Personal history of other diseases of the digestive system: Secondary | ICD-10-CM | POA: Diagnosis not present

## 2015-06-25 DIAGNOSIS — Z8659 Personal history of other mental and behavioral disorders: Secondary | ICD-10-CM | POA: Insufficient documentation

## 2015-06-25 DIAGNOSIS — Z79899 Other long term (current) drug therapy: Secondary | ICD-10-CM | POA: Insufficient documentation

## 2015-06-25 DIAGNOSIS — Z8639 Personal history of other endocrine, nutritional and metabolic disease: Secondary | ICD-10-CM | POA: Insufficient documentation

## 2015-06-25 DIAGNOSIS — Z8619 Personal history of other infectious and parasitic diseases: Secondary | ICD-10-CM | POA: Insufficient documentation

## 2015-06-25 DIAGNOSIS — Z880 Allergy status to penicillin: Secondary | ICD-10-CM | POA: Insufficient documentation

## 2015-06-25 LAB — CBC WITH DIFFERENTIAL/PLATELET
Basophils Absolute: 0 10*3/uL (ref 0.0–0.1)
Basophils Relative: 0 %
EOS PCT: 4 %
Eosinophils Absolute: 0.3 10*3/uL (ref 0.0–0.7)
HEMATOCRIT: 41.2 % (ref 36.0–46.0)
Hemoglobin: 13.8 g/dL (ref 12.0–15.0)
LYMPHS ABS: 3.3 10*3/uL (ref 0.7–4.0)
LYMPHS PCT: 35 %
MCH: 30.9 pg (ref 26.0–34.0)
MCHC: 33.5 g/dL (ref 30.0–36.0)
MCV: 92.2 fL (ref 78.0–100.0)
MONO ABS: 0.6 10*3/uL (ref 0.1–1.0)
Monocytes Relative: 7 %
NEUTROS ABS: 5.3 10*3/uL (ref 1.7–7.7)
Neutrophils Relative %: 54 %
PLATELETS: 237 10*3/uL (ref 150–400)
RBC: 4.47 MIL/uL (ref 3.87–5.11)
RDW: 13.7 % (ref 11.5–15.5)
WBC: 9.6 10*3/uL (ref 4.0–10.5)

## 2015-06-25 LAB — I-STAT CHEM 8, ED
BUN: 12 mg/dL (ref 6–20)
CHLORIDE: 102 mmol/L (ref 101–111)
CREATININE: 1.1 mg/dL — AB (ref 0.44–1.00)
Calcium, Ion: 1.05 mmol/L — ABNORMAL LOW (ref 1.13–1.30)
GLUCOSE: 94 mg/dL (ref 65–99)
HCT: 45 % (ref 36.0–46.0)
Hemoglobin: 15.3 g/dL — ABNORMAL HIGH (ref 12.0–15.0)
POTASSIUM: 3.4 mmol/L — AB (ref 3.5–5.1)
Sodium: 137 mmol/L (ref 135–145)
TCO2: 24 mmol/L (ref 0–100)

## 2015-06-25 MED ORDER — HYDRALAZINE HCL 20 MG/ML IJ SOLN: 10.0000 mg | Freq: Once | INTRAMUSCULAR | Status: DC

## 2015-06-25 MED ORDER — AMLODIPINE BESYLATE 5 MG PO TABS: 10.0000 mg | ORAL_TABLET | Freq: Every day | ORAL | Status: DC

## 2015-06-25 MED ORDER — CLONIDINE HCL 0.1 MG PO TABS
0.1000 mg | ORAL_TABLET | Freq: Once | ORAL | Status: AC
  Administered 2015-06-25: 0.1 mg via ORAL
  Filled 2015-06-25: qty 1

## 2015-06-25 MED ORDER — SODIUM CHLORIDE 0.9 % IV SOLN: INTRAVENOUS | Status: DC

## 2015-06-25 NOTE — ED Notes (Signed)
MD at bedside. 

## 2015-06-25 NOTE — ED Provider Notes (Signed)
CSN: [id number]     Arrival date & time 06/25/15  2151 History   First MD Initiated Contact with Patient 06/25/15 2209     Chief Complaint  Patient presents with  . Hypertension     (Consider location/radiation/quality/duration/timing/severity/associated sxs/prior Treatment) HPI Comments: Patient here complaining of increased blood pressure at home. Her doctor recently went up on her by systolic to 10 mg. States her systolic at home as been going up to 160 with a diastolic of 90. She denies any severe headaches, vomiting, chest pain, shortness of breath. No focal neurological symptoms. Denies any recent increased intake and salt. No palpitations or presyncope. Nothing makes her symptoms better or worse.  Patient is a 65 y.o. female presenting with hypertension. The history is provided by the patient.  Hypertension    Past Medical History  Diagnosis Date  . Hypertension   . Arthritis   . Depression   . Seasonal allergies   . Stroke (HCC) 09/2008  . Chicken pox   . Diverticulitis   . Thyroid disease    Past Surgical History  Procedure Laterality Date  . Wisdom tooth extraction    . Minor breast biopsy  1979   Family History  Problem Relation Age of Onset  . Heart failure Mother   . Heart disease Mother   . Diabetes Mother   . Hypertension Mother   . Hypertension Father   . Hypertension Sister   . Hypertension Brother   . Cancer Brother     Prostate  . Hypertension Sister   . Diabetes Maternal Grandmother    Social History  Substance Use Topics  . Smoking status: Never Smoker   . Smokeless tobacco: None  . Alcohol Use: Yes     Comment: Social Use only   OB History    Gravida Para Term Preterm AB TAB SAB Ectopic Multiple Living   2         2     Review of Systems  All other systems reviewed and are negative.     Allergies  Penicillins and Sulfa antibiotics  Home Medications   Prior to Admission medications   Medication Sig Start Date End Date Taking?  Authorizing Provider  LevOCARNitine (L-CARNITINE PO) Take 800 mg by mouth every morning.    Yes Historical Provider, MD  MULTIPLE VITAMIN PO Take 1 tablet by mouth daily.    Yes Historical Provider, MD  NATTOKINASE PO Take 1 tablet by mouth every morning. Helps blood flow   Yes Historical Provider, MD  nebivolol (BYSTOLIC) 10 MG tablet Take 10 mg by mouth daily.   Yes Historical Provider, MD  NON FORMULARY Barley Green:  Takes 1 tsp daily   Yes Historical Provider, MD  NON FORMULARY Chlorella:  Takes 15 capsules daily   Yes Historical Provider, MD  POTASSIUM PO Take 1 tablet by mouth daily.   Yes Historical Provider, MD  Probiotic Product (PROBIOTIC PO) Take 1 tablet by mouth daily. Bifido Beadlet 50+   Yes Historical Provider, MD  SPIRULINA PO Take 1 teaspoon by mouth daily   Yes Historical Provider, MD  amLODipine (NORVASC) 5 MG tablet TAKE 1 TABLET DAILY 04/18/15   Rollene RotundaJames Hochrein, MD  Ascorbic Acid (VITAMIN C) 1000 MG tablet Take 1,000 mg by mouth every morning.     Historical Provider, MD  B Complex-C (B-COMPLEX WITH VITAMIN C) tablet Take 1 tablet by mouth daily.      Historical Provider, MD  Cholecalciferol (VITAMIN D PO) Take 1,000 Units  by mouth daily.    Historical Provider, MD  Coenzyme Q10 (CO Q-10) 200 MG CAPS Take 1 capsule by mouth daily.     Historical Provider, MD  Iodine, Kelp, (KELP PO) Take by mouth. Take 1 teaspoon by mouth daily    Historical Provider, MD   BP 200/115 mmHg  Pulse 64  Temp(Src) 98.4 F (36.9 C) (Oral)  Resp 20  SpO2 96% Physical Exam  Constitutional: She is oriented to person, place, and time. She appears well-developed and well-nourished.  Non-toxic appearance. No distress.  HENT:  Head: Normocephalic and atraumatic.  Eyes: Conjunctivae, EOM and lids are normal. Pupils are equal, round, and reactive to light.  Neck: Normal range of motion. Neck supple. No tracheal deviation present. No thyroid mass present.  Cardiovascular: Normal rate, regular rhythm  and normal heart sounds.  Exam reveals no gallop.   No murmur heard. Pulmonary/Chest: Effort normal and breath sounds normal. No stridor. No respiratory distress. She has no decreased breath sounds. She has no wheezes. She has no rhonchi. She has no rales.  Abdominal: Soft. Normal appearance and bowel sounds are normal. She exhibits no distension. There is no tenderness. There is no rebound and no CVA tenderness.  Musculoskeletal: Normal range of motion. She exhibits no edema or tenderness.  Neurological: She is alert and oriented to person, place, and time. She has normal strength. No cranial nerve deficit or sensory deficit. GCS eye subscore is 4. GCS verbal subscore is 5. GCS motor subscore is 6.  Skin: Skin is warm and dry. No abrasion and no rash noted.  Psychiatric: She has a normal mood and affect. Her speech is normal and behavior is normal.  Nursing note and vitals reviewed.   ED Course  Procedures (including critical care time) Labs Review Labs Reviewed  I-STAT CHEM 8, ED    Imaging Review No results found. I have personally reviewed and evaluated these images and lab results as part of my medical decision-making.   EKG Interpretation None      MDM   Final diagnoses:  None    Patient given clonidine here. Blood pressure was repeated multiple times and is calmed down. We'll increase her Norvasc to 10 mg and have instructed her to follow-up with her Dr.    Lorre Nick, MD 06/25/15 [phone]

## 2015-06-25 NOTE — ED Notes (Signed)
Pt states that she has had high BP x 1 week off and on. States that she takes her BP meds as prescribed in the morning. Denies headache. States she has been getting over the flu. Alert and oriented.

## 2015-06-25 NOTE — Discharge Instructions (Signed)
Hypertension Hypertension, commonly called high blood pressure, is when the force of blood pumping through your arteries is too strong. Your arteries are the blood vessels that carry blood from your heart throughout your body. A blood pressure reading consists of a higher number over a lower number, such as 110/72. The higher number (systolic) is the pressure inside your arteries when your heart pumps. The lower number (diastolic) is the pressure inside your arteries when your heart relaxes. Ideally you want your blood pressure below 120/80. Hypertension forces your heart to work harder to pump blood. Your arteries may become narrow or stiff. Having untreated or uncontrolled hypertension can cause heart attack, stroke, kidney disease, and other problems. RISK FACTORS Some risk factors for high blood pressure are controllable. Others are not.  Risk factors you cannot control include:   Race. You may be at higher risk if you are African American.  Age. Risk increases with age.  Gender. Men are at higher risk than women before age 45 years. After age 65, women are at higher risk than men. Risk factors you can control include:  Not getting enough exercise or physical activity.  Being overweight.  Getting too much fat, sugar, calories, or salt in your diet.  Drinking too much alcohol. SIGNS AND SYMPTOMS Hypertension does not usually cause signs or symptoms. Extremely high blood pressure (hypertensive crisis) may cause headache, anxiety, shortness of breath, and nosebleed. DIAGNOSIS To check if you have hypertension, your health care provider will measure your blood pressure while you are seated, with your arm held at the level of your heart. It should be measured at least twice using the same arm. Certain conditions can cause a difference in blood pressure between your right and left arms. A blood pressure reading that is higher than normal on one occasion does not mean that you need treatment. If  it is not clear whether you have high blood pressure, you may be asked to return on a different day to have your blood pressure checked again. Or, you may be asked to monitor your blood pressure at home for 1 or more weeks. TREATMENT Treating high blood pressure includes making lifestyle changes and possibly taking medicine. Living a healthy lifestyle can help lower high blood pressure. You may need to change some of your habits. Lifestyle changes may include:  Following the DASH diet. This diet is high in fruits, vegetables, and whole grains. It is low in salt, red meat, and added sugars.  Keep your sodium intake below 2,300 mg per day.  Getting at least 30-45 minutes of aerobic exercise at least 4 times per week.  Losing weight if necessary.  Not smoking.  Limiting alcoholic beverages.  Learning ways to reduce stress. Your health care provider may prescribe medicine if lifestyle changes are not enough to get your blood pressure under control, and if one of the following is true:  You are 18-59 years of age and your systolic blood pressure is above 140.  You are 60 years of age or older, and your systolic blood pressure is above 150.  Your diastolic blood pressure is above 90.  You have diabetes, and your systolic blood pressure is over 140 or your diastolic blood pressure is over 90.  You have kidney disease and your blood pressure is above 140/90.  You have heart disease and your blood pressure is above 140/90. Your personal target blood pressure may vary depending on your medical conditions, your age, and other factors. HOME CARE INSTRUCTIONS    Have your blood pressure rechecked as directed by your health care provider.   Take medicines only as directed by your health care provider. Follow the directions carefully. Blood pressure medicines must be taken as prescribed. The medicine does not work as well when you skip doses. Skipping doses also puts you at risk for  problems.  Do not smoke.   Monitor your blood pressure at home as directed by your health care provider. SEEK MEDICAL CARE IF:   You think you are having a reaction to medicines taken.  You have recurrent headaches or feel dizzy.  You have swelling in your ankles.  You have trouble with your vision. SEEK IMMEDIATE MEDICAL CARE IF:  You develop a severe headache or confusion.  You have unusual weakness, numbness, or feel faint.  You have severe chest or abdominal pain.  You vomit repeatedly.  You have trouble breathing. MAKE SURE YOU:   Understand these instructions.  Will watch your condition.  Will get help right away if you are not doing well or get worse.   This information is not intended to replace advice given to you by your health care provider. Make sure you discuss any questions you have with your health care provider.   Document Released: 03/01/2005 Document Revised: 07/16/2014 Document Reviewed: 12/22/2012 Elsevier Interactive Patient Education 2016 [employer] Inc.  

## 2015-06-26 DIAGNOSIS — I10 Essential (primary) hypertension: Secondary | ICD-10-CM | POA: Diagnosis not present

## 2015-06-26 LAB — URINALYSIS, ROUTINE W REFLEX MICROSCOPIC
BILIRUBIN URINE: NEGATIVE
GLUCOSE, UA: NEGATIVE mg/dL
Hgb urine dipstick: NEGATIVE
KETONES UR: NEGATIVE mg/dL
NITRITE: NEGATIVE
PH: 7 (ref 5.0–8.0)
Protein, ur: NEGATIVE mg/dL
SPECIFIC GRAVITY, URINE: 1.002 — AB (ref 1.005–1.030)

## 2015-06-26 LAB — URINE MICROSCOPIC-ADD ON
RBC / HPF: NONE SEEN RBC/hpf (ref 0–5)
Squamous Epithelial / LPF: NONE SEEN

## 2015-06-26 NOTE — ED Notes (Signed)
Patient is alert and oriented x3.  She was given DC instructions and follow up visit instructions.  Patient gave verbal understanding. She was DC ambulatory under her own power to home.  V/S stable.  He was not showing any signs of distress on DC 

## 2015-06-27 ENCOUNTER — Ambulatory Visit (INDEPENDENT_AMBULATORY_CARE_PROVIDER_SITE_OTHER): Admitting: Internal Medicine

## 2015-06-27 VITALS — BP 178/100 | HR 66 | Temp 98.1°F | Resp 18 | Ht 62.5 in | Wt 179.2 lb

## 2015-06-27 DIAGNOSIS — I10 Essential (primary) hypertension: Secondary | ICD-10-CM

## 2015-06-27 DIAGNOSIS — I158 Other secondary hypertension: Secondary | ICD-10-CM | POA: Diagnosis not present

## 2015-06-27 DIAGNOSIS — E876 Hypokalemia: Secondary | ICD-10-CM | POA: Diagnosis not present

## 2015-06-27 NOTE — Patient Instructions (Signed)
     IF you received an x-ray today, you will receive an invoice from [facility]. Please contact [facility] at [phone] with questions or concerns regarding your invoice.   IF you received labwork today, you will receive an invoice from [facility]. Please contact [facility] at [phone] with questions or concerns regarding your invoice.   Our billing staff will not be able to assist you with questions regarding bills from these companies.  You will be contacted with the lab results as soon as they are available. The fastest way to get your results is to activate your My Chart account. Instructions are located on the last page of this paperwork. If you have not heard from us regarding the results in 2 weeks, please contact this office.      

## 2015-06-27 NOTE — Progress Notes (Signed)
Subjective:    Patient ID: [name], female    DOB: [birth year], 65 y.o.   MRN: [id number] By signing my name below, I, Javier Docker, attest that this documentation has been prepared under the direction and in the presence of Ellamae Sia, MD. Electronically Signed: Javier Docker, ER Scribe. 06/27/2015. 6:43 PM.  Chief Complaint  Patient presents with  . Hypertension    increased bp med on Wed at ER-bp today at home was 226/130  . GI Problem    Stomach bubbling    HPI HPI Comments: [name] is a 65 y.o. female with a past hx of CVA who presents to [facility] complaining of high BP for the last two years. This is the first time I've seen her. Her blood pressures very variable. She is very worried about her blood pressure. She is taking amlodipine and Bystolic. Her blood pressure at home today was at 226/130. She took a large dose of baking soda before coming here. She did this because she was also constipated 2-3 days. She has a history of constipation and feels like her blood pressure is elevated every time she has constant . She has had an extensive workup by primary care, endocrine, and cardiology. She has also been prescribed a statin in the past, but she refused this medication because her cholesterol was normal at that time. Echo and stress test are apparently normal. Screening for pheochromocytoma was negative. She had one slightly elevated urine cortisol and then a normal. She has had multiple low potassium values except for occasionally when she was borderline normal. No licorice. Magnesium level normal.   She also has a past hx of sciatica, which resolved many months ago, but then three months ago she developed right leg pain which resolved with acupuncture. During the period when she was having significant pain in her right leg she developed the flu and that was around the time her blood pressure became uncontrolled.  With regard to her recurrent  problems with constipation despite her dietary trials of the right foods, she was referred for colonoscopy and GI evaluation. She elected not to have colonoscopy but to do cologuard instead--however she never returned test.  Her chart details a long history of preference for alternative medications for her therapies and trying to avoid pharmaceuticals.    Chart review  MRI 2015 IMPRESSION--during hospitalization for hypertensive emergency// 1. Extensive periventricular and subcortical white matter changes are advanced for age. The finding is nonspecific but can be seen in the setting of chronic microvascular ischemia, a demyelinating process such as multiple sclerosis, vasculitis, complicated migraine headaches, or as the sequelae of a prior infectious or inflammatory process. The pattern is somewhat suspicious for a demyelinating process such as multiple sclerosis.  This did not show residual from her CVA 2010  From Dr Rosezella [place] first office note 06/19/2013= She has a history of CVA and non-Q-wave AMI 2010 I don't have the records  Previous care through [facility] previous. 10 years Ago . Husband was in the [employer] and then passed away from cancer.  Prev local care was through dr Asencion Islam functional medicine and then cardiac [facility] when she had her event and release . After stroke 2010 The no one . Primary care.   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 Medication off and On and was controlled .   bystolic given by dr Caprice Red and stopped And ran out of  it. And hctz at begninng Se feeling dry and had palpitation . Added. potassium  Patient Active Problem List   Diagnosis Date Noted  . Special screening for malignant neoplasms, colon 03/05/2015  . Change in bowel habits 03/05/2015  . CAD (coronary artery disease) 09/25/2014  . Elevated hemoglobin (HCC) 05/24/2014  . Pulse slow 04/26/2014  .  Elevated urinary free cortisol level 03/25/2014  . Low serum potassium level 03/25/2014  . Hypertensive heart disease without heart failure 12/22/2013  . Essential hypertension 12/21/2013  . PVC (premature ventricular contraction) 09/11/2013  . Death of family member 08/24/2013  . Left bundle branch block 07/25/2013  . Right thyroid nodule 07/18/2013  . Fatigue 07/18/2013  . Disorder of tooth 07/18/2013  . Autoimmune thyroiditis 06/24/2013  . Elevated TSH 06/19/2013  . Goiter 06/19/2013  . Left bundle branch block (LBBB) on electrocardiogram 06/19/2013  . Hypertension 06/19/2013  . Snoring 06/19/2013  . Alternative medicine 06/19/2013  . Hypertensive urgency 05/23/2013  . History of CVA (cerebrovascular accident) 05/23/2013  . Abdominal discomfort 05/23/2013  . DYSLIPIDEMIA 10/24/2009  . OBESITY, UNSPECIFIED 12/04/2008  . AMI, NON-Q WAVE, INITIAL EPISODE 12/04/2008  . HYPERTENSION 12/02/2008  . ALLERGIC RHINITIS 12/02/2008     Current Outpatient Prescriptions on File Prior to Visit  Medication Sig Dispense Refill  . amLODipine (NORVASC) 5 MG tablet Take 2 tablets (10 mg total) by mouth daily. 90 tablet 0  . Ascorbic Acid (VITAMIN C) 1000 MG tablet Take 1,000 mg by mouth every morning.     . B Complex-C (B-COMPLEX WITH VITAMIN C) tablet Take 1 tablet by mouth daily.      . Cholecalciferol (VITAMIN D PO) Take 1,000 Units by mouth daily.    . LevOCARNitine (L-CARNITINE PO) Take 800 mg by mouth every morning.     . MULTIPLE VITAMIN PO Take 1 tablet by mouth daily.     Marland Kitchen. NATTOKINASE PO Take 1 tablet by mouth every morning. Helps blood flow    . nebivolol (BYSTOLIC) 10 MG tablet Take 10 mg by mouth daily.    . NON FORMULARY Barley Green:  Takes 1 tsp daily    . NON FORMULARY Chlorella:  Takes 15 capsules daily    . POTASSIUM PO Take 1 tablet by mouth daily.    . Probiotic Product (PROBIOTIC PO) Take 1 tablet by mouth daily. Bifido Beadlet 50+    . SPIRULINA PO Take 1 teaspoon by  mouth daily     No current facility-administered medications on file prior to visit.    Review of Systems  Constitutional: Negative for fever, chills, activity change, appetite change, fatigue and unexpected weight change.       Frustrated.   HENT: Negative for trouble swallowing.   Eyes: Negative for visual disturbance.  Respiratory: Negative for cough, chest tightness, shortness of breath and wheezing.   Cardiovascular: Negative for chest pain, palpitations and leg swelling.       Elevated blood pressure.  Musculoskeletal:       She has had a variety of musculoskeletal particularly with some right lateral leg pain 1-2 months that disappeared with treatment. She also has had what sounds like low back pain with occasional sciatica has had none of this in the last 2 months.  Skin:       She is very concerned about the appearance of veins showing through her skin on her legs although describes no problems with varicosities, cold extremities, edema intermittent claudication.  Neurological: Negative for facial asymmetry, light-headedness, numbness  and headaches.  Psychiatric/Behavioral:       She describes significant stress from her life with a fair amount of anxiety although she sleeps extremely well and she does not admit depression        History of Hashimoto's thyroiditis with normal thyroid studies 3 weeks ago    Objective:  BP 178/100 mmHg  Pulse 66  Temp(Src) 98.1 F (36.7 C) (Oral)  Resp 18  Ht 5' 2.5" (1.588 m)  Wt 179 lb 4 oz (81.307 kg)  BMI 32.24 kg/m2  SpO2 97%-She was 190 pounds in July 2016--she has been trying to lose weight  Physical Exam  Constitutional: She is oriented to person, place, and time. She appears well-developed and well-nourished. No distress.  HENT:  Head: Normocephalic and atraumatic.  Eyes: Pupils are equal, round, and reactive to light.  Neck: Neck supple.  Cardiovascular: Normal rate, regular rhythm and intact distal pulses.   No murmur  heard. Loud S4 at the apex.   Pulmonary/Chest: Effort normal. No respiratory distress.  Abdominal: Soft. She exhibits no distension and no mass. There is no tenderness.  There are no abdominal or flank bruits  Musculoskeletal: Normal range of motion. She exhibits no edema.  Straight leg raise negative at 90  Neurological: She is alert and oriented to person, place, and time. She has normal reflexes. Coordination normal.  There are no sensory losses in her lower extremities  Skin: Skin is warm and dry. She is not diaphoretic.  She has some very fine superficial vessels in her legs, no longer patent, but otherwise her skin appears normal  Psychiatric: Her behavior is normal. Thought content normal.  She is frustrated at the lack of answers to her multiple medical questions and that the continued lack of a way of controlling her blood pressure  Nursing note and vitals reviewed.     Assessment & Plan:  Other secondary hypertension  Hypokalemia  Apprehensive  She needs Evaluation by nephropathy. With if occult control hypertension, and persistent hypokalemiia, evaluation should be done to rule out hyperaldosteronism, renal tubular acidosis, renal artery stenosis, Liddle's syndr, salt wasting nephropathies, etc., Diagnoses that might combine these 2 problems.  She must complete Cologuard or have colonoscopy because of her bowel issues  Her brain MRI was distinctly abnormal and while this could be due to extensive untreated hypertension, consideration of other demyelinating disorders should be considered at each office visit  She will continue her her current blood pressure regimen 10 mg Norvasc and 10 mg Bystolic as she is not willing to add clonidine, and we shouldn't add spironolactone before nephrology evaluation. She remains reluctant to pursue further medical intervention that may lead to a procedure or a pharmaceutical.   I have completed the patient encounter in its entirety  including the small portion documented by the scribe, with editing by me where necessary. Jenaro Souder P. Merla Riches, M.D.

## 2015-07-11 ENCOUNTER — Other Ambulatory Visit: Payer: Self-pay | Admitting: *Deleted

## 2015-07-11 MED ORDER — AMLODIPINE BESYLATE 5 MG PO TABS: 10.0000 mg | ORAL_TABLET | Freq: Every day | ORAL | Status: DC

## 2015-07-25 ENCOUNTER — Other Ambulatory Visit: Payer: Self-pay

## 2015-07-25 MED ORDER — NEBIVOLOL HCL 10 MG PO TABS: 10.0000 mg | ORAL_TABLET | Freq: Every day | ORAL | Status: DC

## 2015-09-26 ENCOUNTER — Other Ambulatory Visit: Payer: Self-pay | Admitting: Cardiology

## 2015-09-26 NOTE — Telephone Encounter (Signed)
Rx has been sent to the pharmacy electronically. ° °

## 2015-12-02 ENCOUNTER — Ambulatory Visit: Admitting: Internal Medicine

## 2015-12-06 IMAGING — CR DG ABDOMEN ACUTE W/ 1V CHEST
3 series · 3 of 3 positions shown · non-contrast
Comparison: CT abdomen 05/23/2013

CLINICAL DATA: Upper abdominal pain

EXAM:
DG ABDOMEN ACUTE W/ 1V CHEST

[PA]
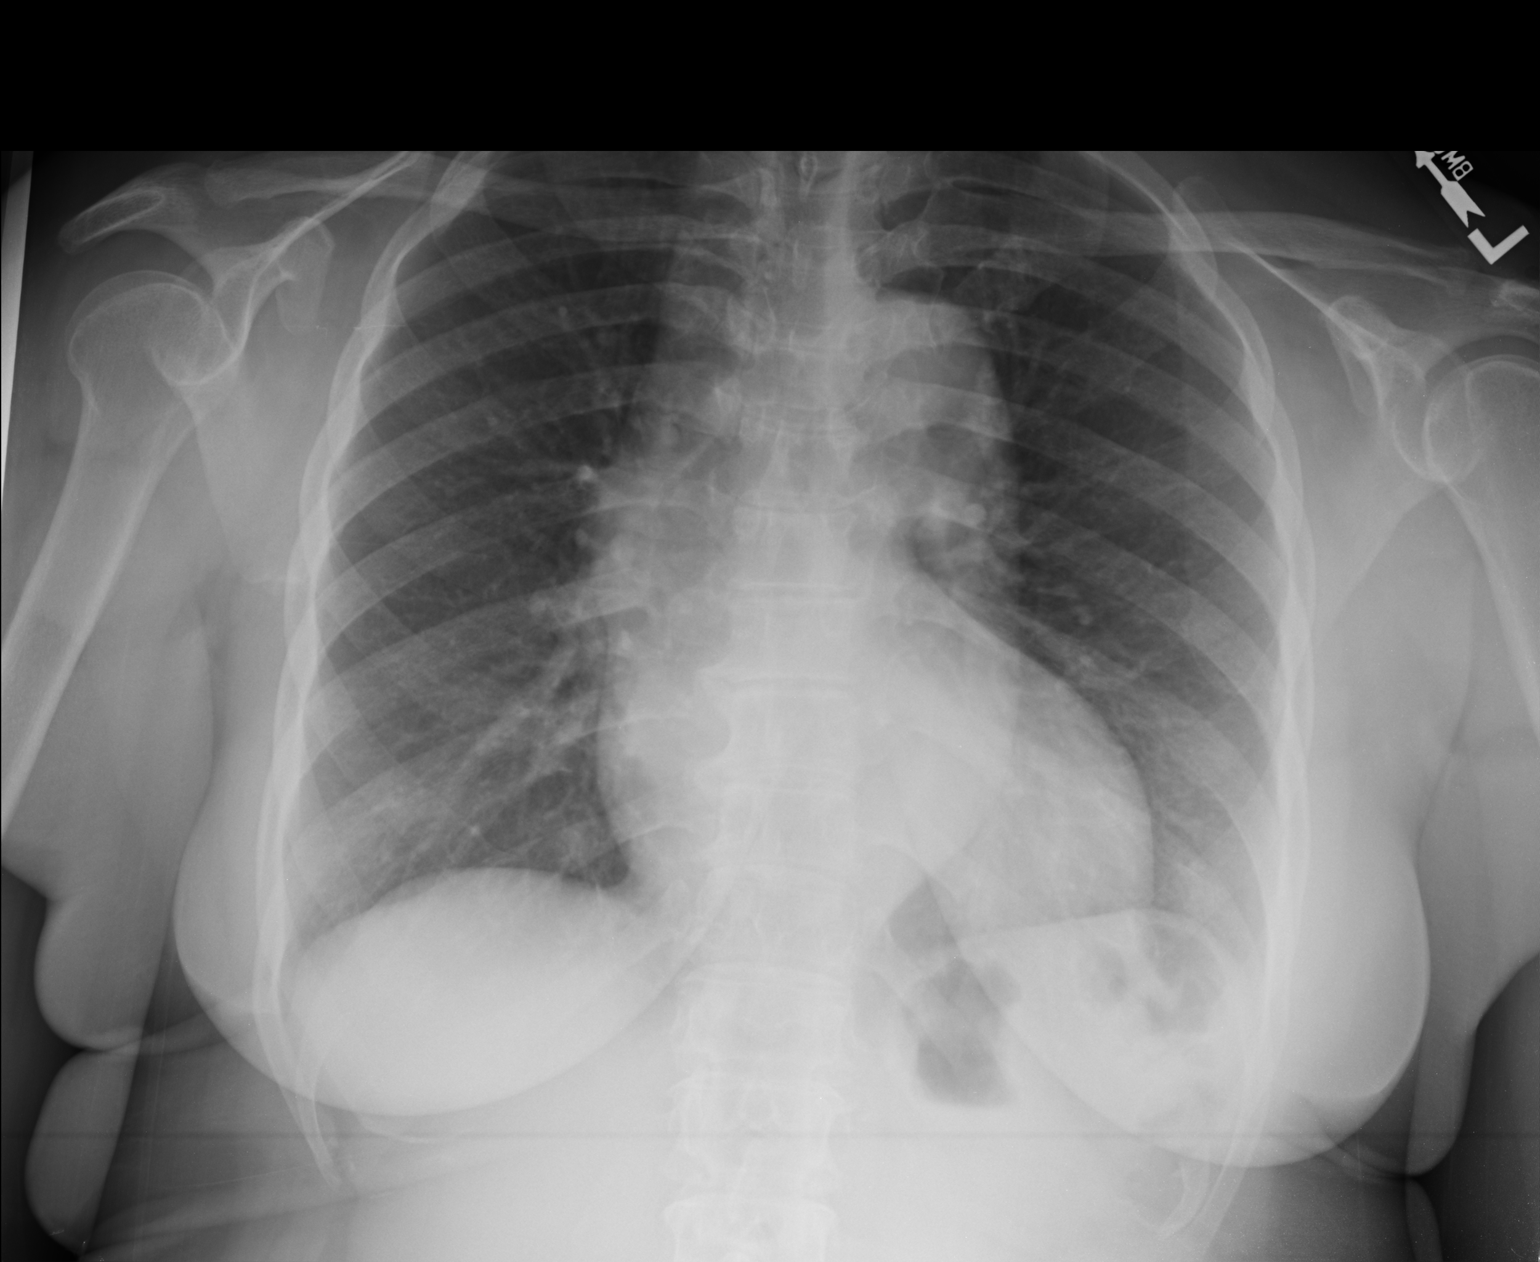

[AP (1 of 2)]
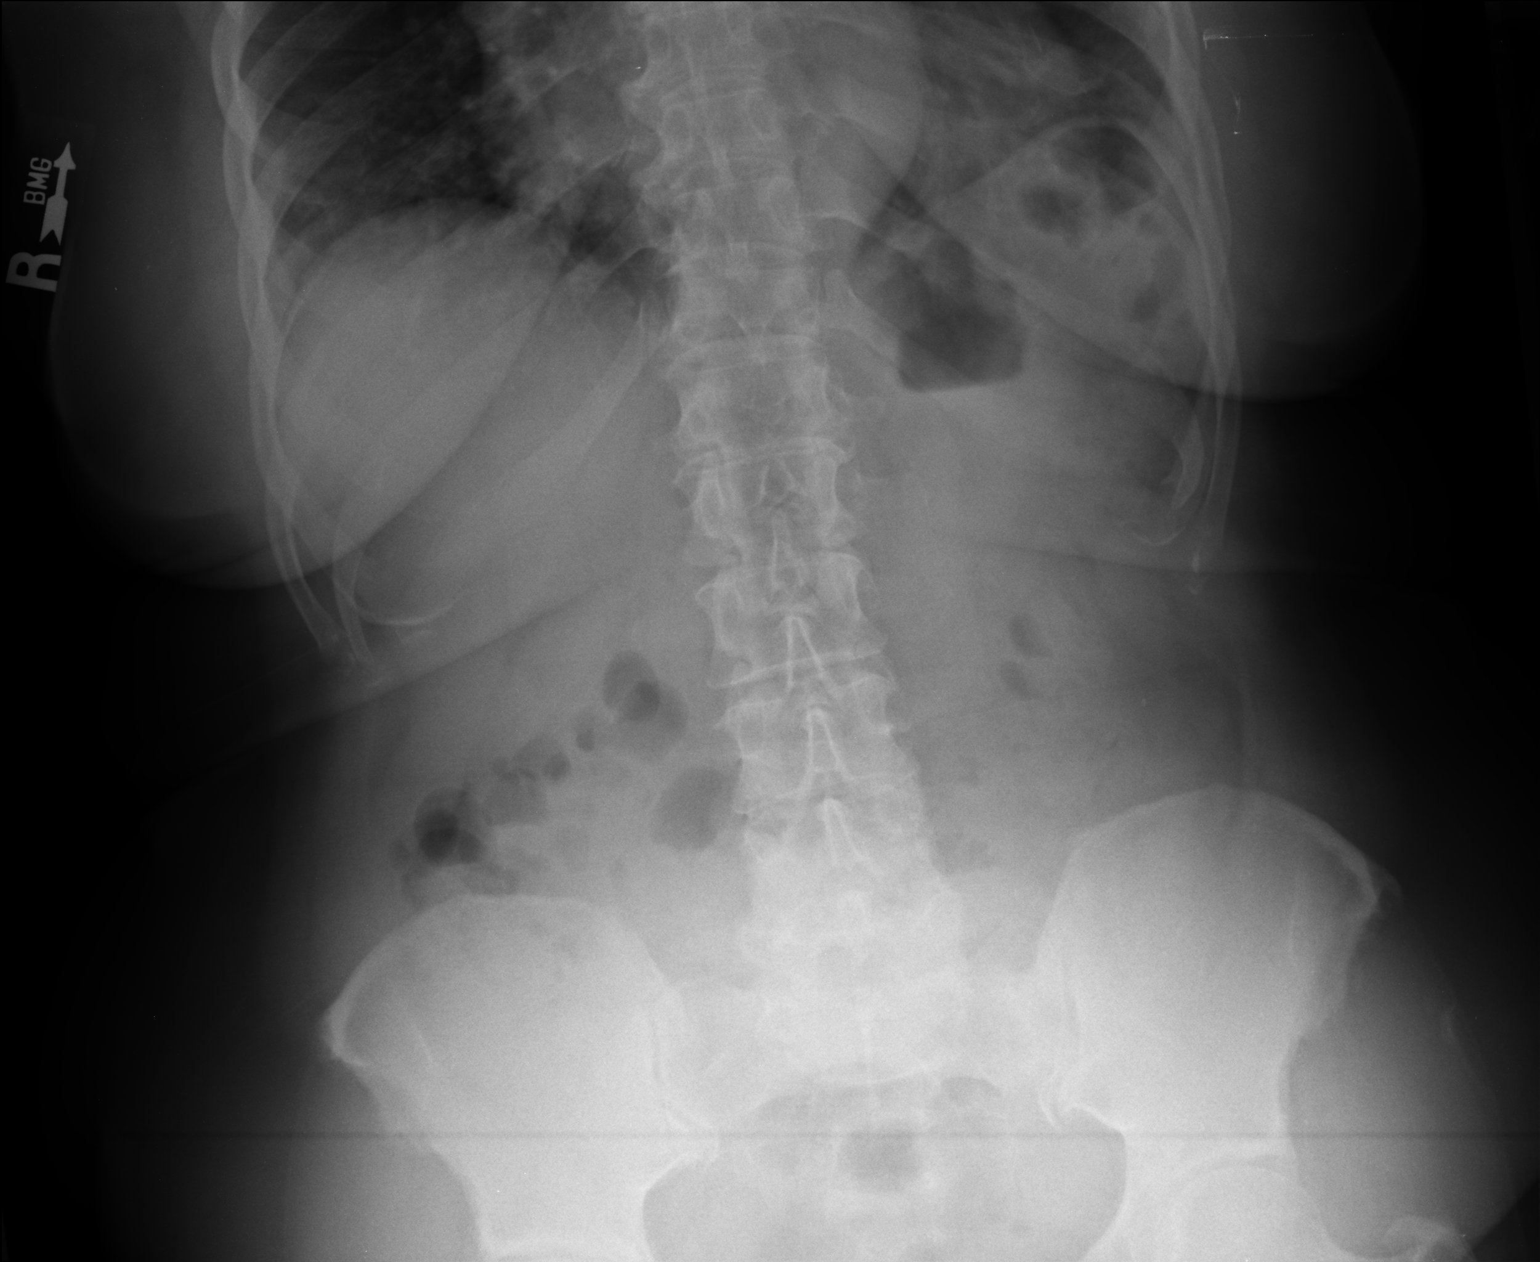

[AP (2 of 2)]
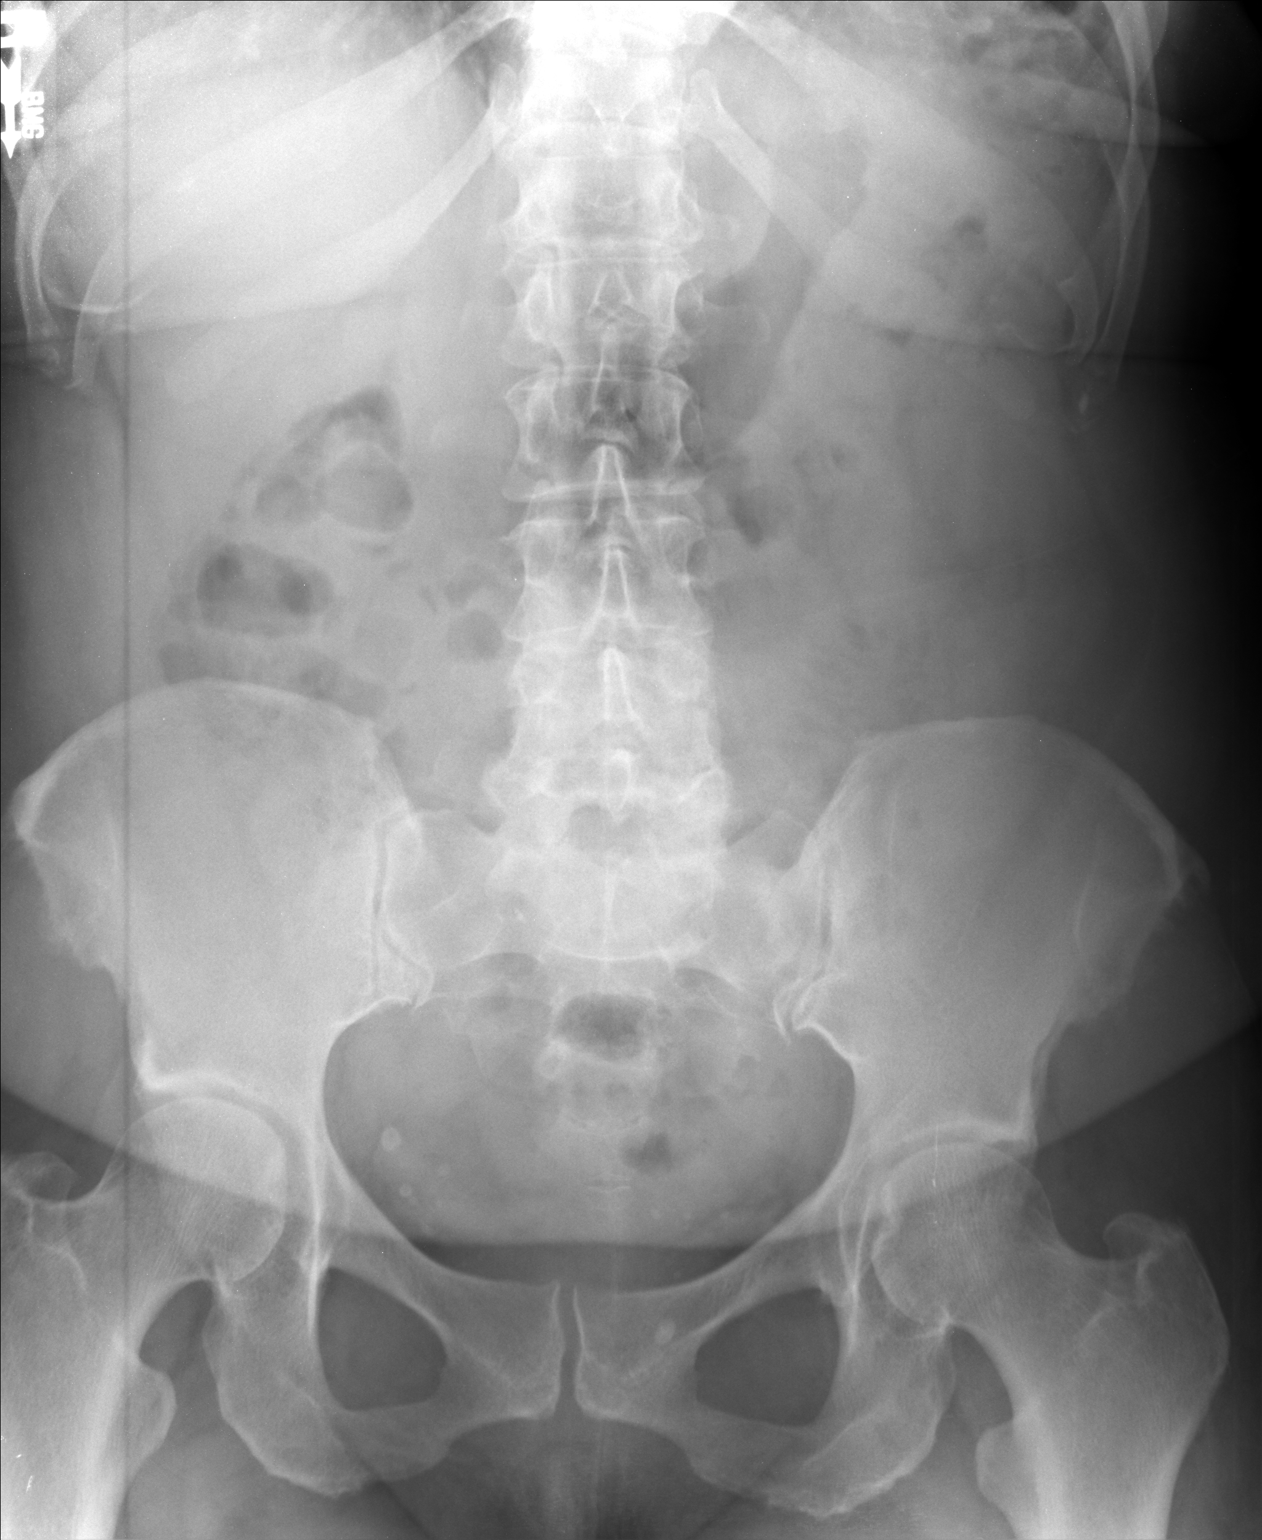

[3 of 3 positions shown; findings below may reference images not displayed]

FINDINGS: Cardiac enlargement. Aorta is uncoiled. Negative for heart failure
or pneumonia. Lungs are clear.

Normal bowel gas pattern. No bowel obstruction or ileus. No free
air. No renal calculi.
IMPRESSION: No acute abnormality in the chest or abdomen.

## 2015-12-10 NOTE — Progress Notes (Signed)
Chief Complaint  Patient presents with  . Follow-up    HPI:  [name] 65 y.o.  Last seen byme 3 /17 also then ed and dr Merla Richesoolittle for  Accelerated ht Patient states that about a month ago her blood pressure was like 140 or 138/88. She is only taking amlodipine 5 mg not 10 mg and states that she thought the 10 mg made her feel bad. Dr. Pamella PertHocker I had increased her by systolic to 10 mg a day which she is taking. She is taking potassium. Doing lifestyle activities with weight loss.  Today she has been having some sneezing and think she's coming down with something has been exposed to a child with a respiratory infection.  Worried about the feelings in her teeth that are loose could be affecting her blood pressure.  No chest pain shortness of breath is concerned about varicose veins and superficial veins in her legs.  She was seen at urgent care in the emergency room with very high blood pressure with uncertain follow-up she is supposed to follow-up with cardiology in November.  Still has concern that she could have a cortisol problem. ROS: See pertinent positives and negatives per HPI. No fever no cough but achy today   Past Medical History:  Diagnosis Date  . Arthritis   . Chicken pox   . Depression   . Diverticulitis   . Hypertension   . Seasonal allergies   . Stroke (HCC) 09/2008  . Thyroid disease     Family History  Problem Relation Age of Onset  . Heart failure Mother   . Heart disease Mother   . Diabetes Mother   . Hypertension Mother   . Hypertension Father   . Hypertension Sister   . Hypertension Brother   . Cancer Brother     Prostate  . Hypertension Sister   . Diabetes Maternal Grandmother     Social History   Social History  . Marital status: Widowed    Spouse name: N/A  . Number of children: N/A  . Years of education: N/A   Social History Main Topics  . Smoking status: Never Smoker  . Smokeless tobacco: None  . Alcohol use Yes   Comment: Social Use only  . Drug use: No  . Sexual activity: Not Asked   Other Topics Concern  . 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 amLODipine (NORVASC) 5 MG tablet TAKE 2 TABLETS DAILY 60 tablet 0  . Ascorbic Acid (VITAMIN C) 1000 MG tablet Take 1,000 mg by mouth every morning.     . B Complex-C (B-COMPLEX WITH VITAMIN C) tablet Take 1 tablet by mouth daily.      . Cholecalciferol (VITAMIN D PO) Take 1,000 Units by mouth daily.    . LevOCARNitine (L-CARNITINE PO) Take 800 mg by mouth every morning.     . MULTIPLE VITAMIN PO Take 1 tablet by mouth daily.     Marland Kitchen. NATTOKINASE PO Take 1 tablet by mouth every morning.  Helps blood flow    . nebivolol (BYSTOLIC) 10 MG tablet Take 1 tablet (10 mg total) by mouth daily. 90 tablet 3  . NON FORMULARY Barley Green:  Takes 1 tsp daily    . NON FORMULARY Chlorella:  Takes 15 capsules daily    . POTASSIUM PO Take 1 tablet by mouth daily.    . Probiotic Product (PROBIOTIC PO) Take 1 tablet by mouth daily. Bifido Beadlet 50+    . SPIRULINA PO Take 1 teaspoon by mouth daily     No facility-administered medications prior to visit.      EXAM:  BP (!) 180/98 (BP Location: Right Arm, Patient Position: Sitting, Cuff Size: Large)   Pulse 75   Temp 98.2 F (36.8 C) (Oral)   Ht 5' 2.5" (1.588 m)   Wt 186 lb 4 oz (84.5 kg)   SpO2 98%   BMI 33.52 kg/m   Body mass index is 33.52 kg/m.  GENERAL: vitals reviewed and listed above, alert, oriented, appears well hydrated and in no acute distress HEENT: atraumatic, conjunctiva  clear, no obvious abnormalities on  inspection of external nose and ears tmx clear OP : no lesion edema or exudate  NECK: no obvious masses on inspection palpation thyroid .  LUNGS: clear to auscultation bilaterally, no wheezes, rales or rhonchi, good air movement Abdomen:  Sof,t normal bowel sounds without hepatosplenomegaly, no guarding rebound or masses no CVA tenderness CV: HRRR, no clubbing cyanosis or  peripheral edema nl cap refill  There are some superficial veins spider veins on her thighs no ulcers cords significant edema or spots. MS: moves all extremities without noticeable focal  abnormality PSYCH: pleasant and cooperative, no obvious depression or anxiety Lab Results  Component Value Date   WBC 9.6 06/25/2015   HGB 13.8 06/25/2015   HCT 41.2 06/25/2015   PLT 237 06/25/2015   GLUCOSE 94 06/25/2015   CHOL 150 05/24/2013   TRIG 85 05/24/2013   HDL 56 05/24/2013   LDLCALC 77 05/24/2013   ALT 17 06/03/2015   AST 29 06/03/2015   NA 137 06/25/2015   K 3.4 (L) 06/25/2015   CL 102 06/25/2015   CREATININE 1.10 (H) 06/25/2015   BUN 12 06/25/2015   CO2 28 06/03/2015   TSH 2.17 06/03/2015   INR 1.04 02/01/2011   HGBA1C 5.7 (H) 05/23/2013    ASSESSMENT AND PLAN:  Discussed the following assessment and plan:  Essential hypertension - Plan: Basic metabolic panel, Magnesium, Protein / creatinine ratio, urine  Accelerated hypertension - Plan: Basic metabolic panel, Magnesium, Protein / creatinine ratio, urine  Hypokalemia - Plan: Basic metabolic panel, Magnesium, Protein / creatinine ratio, urine  URI, acute  Hashimoto's disease  Low serum potassium level  Alternative medicine  History of CVA (cerebrovascular accident)  Medication monitoring encounter  Mild renal insufficiency - last creatininewas 1.1 I agree with Dr. Merla Riches s last assessment that she could have secondary hypertension but follow up  has been difficult  And not continuous   She has had reservation about medications and routine  medical care because of past bad experiences with her husband's illness.and originally wanted to try  natural supplement  rx     However she is now taking medication and agrees to go see a nephrologist upon referral. She has not had an MRI of her adrenals but has had scan of her abdomen with no adrenal masses. She's had laboratory testing done to look for pheochromocytoma but I'm not sure she ever got an  aldosterone /renin evaluation. She has had a mildly high 24-hour free cortisol nl on  repeat  and positive thyroid autoantibodies.w /nl tsh . Total visit > 50% spent counseling and coordinating care as indicated in above note and in instructions to patient .     -Patient advised to return or notify health care team  if symptoms worsen ,persist or new concerns arise.  Patient Instructions  Bp is up today  Repeat 180/ 98  I am glad it was in the 140 138 range in past month. Checking kidney function and potassium magnesium today . And urine protein.  You may benefit from seeing  A nephrologist to if kic=dney function is down.   Your exam is reassuring but maybe getting a respiratory virus infection that will run its course over 7-10 days. I don't see any signs of pneumonia or bacterial infection. You may want to limit her activity until you're feeling better but continue your healthy eating lifestyle and attempts at weight loss. Plan follow-up appointment with cardiology  in November. However if your blood pressure is not coming back down we may need to contact them about next plan.  consider increase to amlodipine to 7.5 mg  Since you didn't feel well on the  10 mg  I will send this information to Dr Antoine Poche also      Neta Mends. Merrillyn Ackerley M.D.

## 2015-12-11 ENCOUNTER — Encounter: Payer: Self-pay | Admitting: Internal Medicine

## 2015-12-11 ENCOUNTER — Ambulatory Visit (INDEPENDENT_AMBULATORY_CARE_PROVIDER_SITE_OTHER): Admitting: Internal Medicine

## 2015-12-11 VITALS — BP 180/98 | HR 75 | Temp 98.2°F | Ht 62.5 in | Wt 186.2 lb

## 2015-12-11 DIAGNOSIS — E876 Hypokalemia: Secondary | ICD-10-CM | POA: Diagnosis not present

## 2015-12-11 DIAGNOSIS — J069 Acute upper respiratory infection, unspecified: Secondary | ICD-10-CM

## 2015-12-11 DIAGNOSIS — E063 Autoimmune thyroiditis: Secondary | ICD-10-CM | POA: Diagnosis not present

## 2015-12-11 DIAGNOSIS — Z8673 Personal history of transient ischemic attack (TIA), and cerebral infarction without residual deficits: Secondary | ICD-10-CM

## 2015-12-11 DIAGNOSIS — Z5189 Encounter for other specified aftercare: Secondary | ICD-10-CM

## 2015-12-11 DIAGNOSIS — I10 Essential (primary) hypertension: Secondary | ICD-10-CM | POA: Diagnosis not present

## 2015-12-11 DIAGNOSIS — Z5181 Encounter for therapeutic drug level monitoring: Secondary | ICD-10-CM

## 2015-12-11 DIAGNOSIS — N289 Disorder of kidney and ureter, unspecified: Secondary | ICD-10-CM

## 2015-12-11 LAB — BASIC METABOLIC PANEL
BUN: 17 mg/dL (ref 6–23)
CHLORIDE: 104 meq/L (ref 96–112)
CO2: 29 mEq/L (ref 19–32)
CREATININE: 1.09 mg/dL (ref 0.40–1.20)
Calcium: 8.9 mg/dL (ref 8.4–10.5)
GFR: 64.88 mL/min (ref >60.00)
Glucose, Bld: 95 mg/dL (ref 70–99)
POTASSIUM: 3.8 meq/L (ref 3.5–5.1)
Sodium: 141 mEq/L (ref 135–145)

## 2015-12-11 NOTE — Progress Notes (Signed)
Pre visit review using our clinic review tool, if applicable. No additional management support is needed unless otherwise documented below in the visit note. 

## 2015-12-11 NOTE — Patient Instructions (Addendum)
Bp is up today  Repeat 180/ 98  I am glad it was in the 140 138 range in past month. Checking kidney function and potassium magnesium today . And urine protein.  You may benefit from seeing  A nephrologist to if kic=dney function is down.   Your exam is reassuring but maybe getting a respiratory virus infection that will run its course over 7-10 days. I don't see any signs of pneumonia or bacterial infection. You may want to limit her activity until you're feeling better but continue your healthy eating lifestyle and attempts at weight loss. Plan follow-up appointment with cardiology  in November. However if your blood pressure is not coming back down we may need to contact them about next plan.  consider increase to amlodipine to 7.5 mg  Since you didn't feel well on the  10 mg  I will send this information to Dr Antoine PocheHochrein also

## 2015-12-12 LAB — PROTEIN / CREATININE RATIO, URINE: Creatinine, Urine: 24 mg/dL (ref 20–320)

## 2015-12-12 LAB — MAGNESIUM: MAGNESIUM: 2.2 mg/dL (ref 1.5–2.5)

## 2016-01-05 ENCOUNTER — Encounter: Payer: Self-pay | Admitting: Family Medicine

## 2016-01-07 ENCOUNTER — Telehealth: Payer: Self-pay | Admitting: Internal Medicine

## 2016-01-07 NOTE — Telephone Encounter (Signed)
Misty pt returned your call °

## 2016-01-08 NOTE — Telephone Encounter (Signed)
Stable Labs  No sig protein in urine  Pt notified of results by telephone.

## 2016-01-24 ENCOUNTER — Other Ambulatory Visit: Payer: Self-pay | Admitting: Family Medicine

## 2016-01-26 ENCOUNTER — Telehealth: Payer: Self-pay | Admitting: Cardiology

## 2016-01-26 NOTE — Telephone Encounter (Signed)
Returned call to patient no answer.LMTC. 

## 2016-01-26 NOTE — Telephone Encounter (Signed)
Patient calling the office for samples of medication:   1.  What medication and dosage are you requesting samples for?Bystolic  2.  Are you currently out of this medication? Need enough until her mail order comes

## 2016-01-27 NOTE — Telephone Encounter (Signed)
Returned call to patient left message on personal voice mail Bystolic 10 mg samples left at front desk of [facility] office. 

## 2016-02-13 ENCOUNTER — Other Ambulatory Visit: Payer: Self-pay | Admitting: Nephrology

## 2016-02-13 DIAGNOSIS — I159 Secondary hypertension, unspecified: Secondary | ICD-10-CM

## 2016-02-13 DIAGNOSIS — Z6833 Body mass index (BMI) 33.0-33.9, adult: Secondary | ICD-10-CM

## 2016-02-13 DIAGNOSIS — E876 Hypokalemia: Secondary | ICD-10-CM

## 2016-02-23 ENCOUNTER — Ambulatory Visit
Admission: RE | Admit: 2016-02-23 | Discharge: 2016-02-23 | Disposition: A | Discharge status: Home / Self Care | Source: Ambulatory Visit | Attending: Nephrology | Admitting: Nephrology

## 2016-02-23 DIAGNOSIS — Z6833 Body mass index (BMI) 33.0-33.9, adult: Secondary | ICD-10-CM

## 2016-02-23 DIAGNOSIS — E876 Hypokalemia: Secondary | ICD-10-CM

## 2016-02-23 DIAGNOSIS — I159 Secondary hypertension, unspecified: Secondary | ICD-10-CM

## 2016-02-25 ENCOUNTER — Other Ambulatory Visit: Payer: Self-pay | Admitting: Nephrology

## 2016-02-25 DIAGNOSIS — I10 Essential (primary) hypertension: Secondary | ICD-10-CM

## 2016-03-29 ENCOUNTER — Other Ambulatory Visit

## 2016-03-30 ENCOUNTER — Other Ambulatory Visit: Payer: Self-pay | Admitting: Nephrology

## 2016-03-30 ENCOUNTER — Ambulatory Visit
Admission: RE | Admit: 2016-03-30 | Discharge: 2016-03-30 | Disposition: A | Discharge status: Home / Self Care | Source: Ambulatory Visit | Attending: Nephrology | Admitting: Nephrology

## 2016-03-30 DIAGNOSIS — I10 Essential (primary) hypertension: Secondary | ICD-10-CM

## 2016-03-30 MED ORDER — GADOBENATE DIMEGLUMINE 529 MG/ML IV SOLN
17.0000 mL | Freq: Once | INTRAVENOUS | Status: AC | PRN
  Administered 2016-03-30: 17 mL via INTRAVENOUS

## 2016-09-28 ENCOUNTER — Encounter (HOSPITAL_COMMUNITY): Payer: Self-pay | Admitting: Family Medicine

## 2016-09-28 ENCOUNTER — Emergency Department (HOSPITAL_COMMUNITY): Payer: Medicare Other

## 2016-09-28 ENCOUNTER — Observation Stay (HOSPITAL_COMMUNITY): Payer: Medicare Other

## 2016-09-28 ENCOUNTER — Observation Stay (HOSPITAL_COMMUNITY)
Admission: EM | Admit: 2016-09-28 | Discharge: 2016-09-30 | Disposition: A | Discharge status: Home / Self Care | Payer: Medicare Other | Attending: Internal Medicine | Admitting: Internal Medicine

## 2016-09-28 DIAGNOSIS — E876 Hypokalemia: Secondary | ICD-10-CM | POA: Insufficient documentation

## 2016-09-28 DIAGNOSIS — E785 Hyperlipidemia, unspecified: Secondary | ICD-10-CM | POA: Diagnosis not present

## 2016-09-28 DIAGNOSIS — I6529 Occlusion and stenosis of unspecified carotid artery: Secondary | ICD-10-CM | POA: Insufficient documentation

## 2016-09-28 DIAGNOSIS — I6782 Cerebral ischemia: Secondary | ICD-10-CM | POA: Diagnosis not present

## 2016-09-28 DIAGNOSIS — G459 Transient cerebral ischemic attack, unspecified: Secondary | ICD-10-CM | POA: Diagnosis not present

## 2016-09-28 DIAGNOSIS — F419 Anxiety disorder, unspecified: Secondary | ICD-10-CM | POA: Diagnosis not present

## 2016-09-28 DIAGNOSIS — I672 Cerebral atherosclerosis: Secondary | ICD-10-CM | POA: Insufficient documentation

## 2016-09-28 DIAGNOSIS — W1789XA Other fall from one level to another, initial encounter: Secondary | ICD-10-CM | POA: Diagnosis not present

## 2016-09-28 DIAGNOSIS — Z79899 Other long term (current) drug therapy: Secondary | ICD-10-CM | POA: Insufficient documentation

## 2016-09-28 DIAGNOSIS — E669 Obesity, unspecified: Secondary | ICD-10-CM | POA: Insufficient documentation

## 2016-09-28 DIAGNOSIS — I69398 Other sequelae of cerebral infarction: Secondary | ICD-10-CM | POA: Insufficient documentation

## 2016-09-28 DIAGNOSIS — R05 Cough: Secondary | ICD-10-CM | POA: Diagnosis not present

## 2016-09-28 DIAGNOSIS — I160 Hypertensive urgency: Secondary | ICD-10-CM | POA: Insufficient documentation

## 2016-09-28 DIAGNOSIS — Z6833 Body mass index (BMI) 33.0-33.9, adult: Secondary | ICD-10-CM | POA: Diagnosis not present

## 2016-09-28 DIAGNOSIS — R531 Weakness: Principal | ICD-10-CM | POA: Insufficient documentation

## 2016-09-28 DIAGNOSIS — R030 Elevated blood-pressure reading, without diagnosis of hypertension: Secondary | ICD-10-CM | POA: Diagnosis not present

## 2016-09-28 DIAGNOSIS — Z8673 Personal history of transient ischemic attack (TIA), and cerebral infarction without residual deficits: Secondary | ICD-10-CM

## 2016-09-28 DIAGNOSIS — N183 Chronic kidney disease, stage 3 (moderate): Secondary | ICD-10-CM | POA: Insufficient documentation

## 2016-09-28 DIAGNOSIS — I129 Hypertensive chronic kidney disease with stage 1 through stage 4 chronic kidney disease, or unspecified chronic kidney disease: Secondary | ICD-10-CM | POA: Insufficient documentation

## 2016-09-28 DIAGNOSIS — R200 Anesthesia of skin: Secondary | ICD-10-CM | POA: Insufficient documentation

## 2016-09-28 DIAGNOSIS — I447 Left bundle-branch block, unspecified: Secondary | ICD-10-CM | POA: Insufficient documentation

## 2016-09-28 DIAGNOSIS — G4489 Other headache syndrome: Secondary | ICD-10-CM | POA: Diagnosis not present

## 2016-09-28 DIAGNOSIS — I2510 Atherosclerotic heart disease of native coronary artery without angina pectoris: Secondary | ICD-10-CM | POA: Insufficient documentation

## 2016-09-28 DIAGNOSIS — I10 Essential (primary) hypertension: Secondary | ICD-10-CM | POA: Diagnosis not present

## 2016-09-28 HISTORY — DX: Sciatica, unspecified side: M54.30

## 2016-09-28 LAB — RAPID URINE DRUG SCREEN, HOSP PERFORMED
Amphetamines: NOT DETECTED
BARBITURATES: NOT DETECTED
Benzodiazepines: NOT DETECTED
COCAINE: NOT DETECTED
OPIATES: NOT DETECTED
Tetrahydrocannabinol: NOT DETECTED

## 2016-09-28 LAB — PROTIME-INR
INR: 0.99
PROTHROMBIN TIME: 13.1 s (ref 11.4–15.2)

## 2016-09-28 LAB — CBC
HCT: 43.8 % (ref 36.0–46.0)
Hemoglobin: 15 g/dL (ref 12.0–15.0)
MCH: 31.3 pg (ref 26.0–34.0)
MCHC: 34.2 g/dL (ref 30.0–36.0)
MCV: 91.3 fL (ref 78.0–100.0)
PLATELETS: 193 10*3/uL (ref 150–400)
RBC: 4.8 MIL/uL (ref 3.87–5.11)
RDW: 13.5 % (ref 11.5–15.5)
WBC: 10.9 10*3/uL — ABNORMAL HIGH (ref 4.0–10.5)

## 2016-09-28 LAB — DIFFERENTIAL
Basophils Absolute: 0.1 10*3/uL (ref 0.0–0.1)
Basophils Relative: 1 %
EOS PCT: 5 %
Eosinophils Absolute: 0.6 10*3/uL (ref 0.0–0.7)
LYMPHS PCT: 25 %
Lymphs Abs: 2.7 10*3/uL (ref 0.7–4.0)
MONO ABS: 0.8 10*3/uL (ref 0.1–1.0)
Monocytes Relative: 8 %
Neutro Abs: 6.7 10*3/uL (ref 1.7–7.7)
Neutrophils Relative %: 61 %

## 2016-09-28 LAB — I-STAT CHEM 8, ED
BUN: 18 mg/dL (ref 6–20)
CALCIUM ION: 1.12 mmol/L — AB (ref 1.15–1.40)
CHLORIDE: 104 mmol/L (ref 101–111)
Creatinine, Ser: 1 mg/dL (ref 0.44–1.00)
Glucose, Bld: 106 mg/dL — ABNORMAL HIGH (ref 65–99)
HCT: 46 % (ref 36.0–46.0)
Hemoglobin: 15.6 g/dL — ABNORMAL HIGH (ref 12.0–15.0)
Potassium: 3.3 mmol/L — ABNORMAL LOW (ref 3.5–5.1)
SODIUM: 143 mmol/L (ref 135–145)
TCO2: 25 mmol/L (ref 0–100)

## 2016-09-28 LAB — COMPREHENSIVE METABOLIC PANEL
ALK PHOS: 74 U/L (ref 38–126)
ALT: 17 U/L (ref 14–54)
ANION GAP: 8 (ref 5–15)
AST: 40 U/L (ref 15–41)
Albumin: 4.1 g/dL (ref 3.5–5.0)
BUN: 16 mg/dL (ref 6–20)
CALCIUM: 9.1 mg/dL (ref 8.9–10.3)
CO2: 25 mmol/L (ref 22–32)
Chloride: 107 mmol/L (ref 101–111)
Creatinine, Ser: 1.08 mg/dL — ABNORMAL HIGH (ref 0.44–1.00)
GFR calc non Af Amer: 53 mL/min — ABNORMAL LOW (ref >60)
Glucose, Bld: 105 mg/dL — ABNORMAL HIGH (ref 65–99)
Potassium: 3.5 mmol/L (ref 3.5–5.1)
SODIUM: 140 mmol/L (ref 135–145)
TOTAL PROTEIN: 7.4 g/dL (ref 6.5–8.1)
Total Bilirubin: 1.5 mg/dL — ABNORMAL HIGH (ref 0.3–1.2)

## 2016-09-28 LAB — I-STAT TROPONIN, ED: Troponin i, poc: 0 ng/mL (ref 0.00–0.08)

## 2016-09-28 LAB — TSH: TSH: 3.655 u[IU]/mL (ref 0.350–4.500)

## 2016-09-28 LAB — APTT: aPTT: 30 seconds (ref 24–36)

## 2016-09-28 LAB — T4, FREE: Free T4: 0.96 ng/dL (ref 0.61–1.12)

## 2016-09-28 LAB — TROPONIN I: Troponin I: <0.03 ng/mL (ref <0.03)

## 2016-09-28 MED ORDER — ONDANSETRON HCL 4 MG/2ML IJ SOLN: 4.0000 mg | Freq: Four times a day (QID) | INTRAMUSCULAR | Status: DC | PRN

## 2016-09-28 MED ORDER — NEBIVOLOL HCL 10 MG PO TABS
10.0000 mg | ORAL_TABLET | Freq: Every day | ORAL | Status: DC
  Filled 2016-09-28: qty 1

## 2016-09-28 MED ORDER — CLOPIDOGREL BISULFATE 75 MG PO TABS
75.0000 mg | ORAL_TABLET | Freq: Every day | ORAL | Status: DC
  Administered 2016-09-29: 75 mg via ORAL
  Filled 2016-09-28: qty 1

## 2016-09-28 MED ORDER — ACETAMINOPHEN 650 MG RE SUPP: 650.0000 mg | Freq: Four times a day (QID) | RECTAL | Status: DC | PRN

## 2016-09-28 MED ORDER — CLOPIDOGREL BISULFATE 75 MG PO TABS
300.0000 mg | ORAL_TABLET | ORAL | Status: AC
  Administered 2016-09-28: 300 mg via ORAL
  Filled 2016-09-28: qty 4

## 2016-09-28 MED ORDER — ONDANSETRON HCL 4 MG PO TABS: 4.0000 mg | ORAL_TABLET | Freq: Four times a day (QID) | ORAL | Status: DC | PRN

## 2016-09-28 MED ORDER — ASPIRIN 325 MG PO TABS
325.0000 mg | ORAL_TABLET | Freq: Every day | ORAL | Status: DC
  Administered 2016-09-28: 325 mg via ORAL
  Filled 2016-09-28 (×2): qty 1

## 2016-09-28 MED ORDER — ASPIRIN 300 MG RE SUPP: 300.0000 mg | Freq: Every day | RECTAL | Status: DC

## 2016-09-28 MED ORDER — HYDRALAZINE HCL 20 MG/ML IJ SOLN: 5.0000 mg | INTRAMUSCULAR | Status: DC | PRN

## 2016-09-28 MED ORDER — ENOXAPARIN SODIUM 40 MG/0.4ML ~~LOC~~ SOLN
40.0000 mg | Freq: Every day | SUBCUTANEOUS | Status: DC
  Administered 2016-09-28 – 2016-09-30 (×3): 40 mg via SUBCUTANEOUS
  Filled 2016-09-28 (×2): qty 0.4

## 2016-09-28 MED ORDER — ACETAMINOPHEN 325 MG PO TABS
650.0000 mg | ORAL_TABLET | Freq: Four times a day (QID) | ORAL | Status: DC | PRN
  Administered 2016-09-29 – 2016-09-30 (×2): 650 mg via ORAL
  Filled 2016-09-28 (×2): qty 2

## 2016-09-28 NOTE — ED Notes (Signed)
Attempted report x1. 

## 2016-09-28 NOTE — ED Notes (Signed)
ED Provider at bedside. 

## 2016-09-28 NOTE — ED Notes (Signed)
Patient states "I don't feel right" and "my arm is moving by itself". This RN noted some involuntary muscle contractions in right arm during IV start. MD Horton informed of patient's arrival and current symptoms. At this time MD advises RN to place orders for CT and lab work.

## 2016-09-28 NOTE — H&P (Signed)
History and Physical    [name] ZOX:[id number] DOB: 1950-06-12 DOA: 09/28/2016  PCP: Madelin Headings, MD Patient coming from: home  Chief Complaint: R sided weakness and numbness.   HPI: [name] is a 66 y.o. female with medical history significant of CVA in 2010 with residual right-sided numbness, thyroid dysfunction, seasonal allergies, hypertension, diverticulosis, depression. Patient reports several vague somatic complaints over the last several days including "I felt like I've had a cloud of gas in my brain for the last couple of days." Patient also states that she has felt drained for the last 2-3 days with intermittent palpitations (which is not necessarily anything new for her). Patient reports being awake throughout the night prior to onset of symptoms. At 03:00 patient states that she is ambulating to the bathroom when she had sudden onset right-sided weakness and numbness. She stumbled on her way to the bathroom running into the wall but denies any syncope, headache, striking of her head. Patient states that her symptoms improved while sitting on the toilet passing gas. She called EMS who brought patient to the ED. Symptoms returned again in the ED at approximately 05:30 but slowly improved since that time. Patient currently feels like she has pebbles touching her skin of her right hand especially the tips of the fingers. Patient denies recent chest pain, shortness breath, abdominal pain, dysuria, urgency, flank pain, fevers, nausea, vomiting.   ED Course: Objective findings outlined below. Stroke protocol initiated.    Review of Systems: As per HPI otherwise all other systems reviewed and are negative  Ambulatory Status: no restrictions  Past Medical History:  Diagnosis Date  . Arthritis   . Chicken pox   . Depression   . Diverticulitis   . Hypertension   . Sciatica   . Seasonal allergies   . Stroke (HCC) 09/2008  . Thyroid disease     Past  Surgical History:  Procedure Laterality Date  . MINOR BREAST BIOPSY  1979  . WISDOM TOOTH EXTRACTION      Social History   Social History  . Marital status: Widowed    Spouse name: N/A  . Number of children: N/A  . Years of education: N/A   Occupational History  . Not on file.   Social History Main Topics  . Smoking status: Never Smoker  . Smokeless tobacco: Not on file  . Alcohol use Yes     Comment: Social Use only  . Drug use: No  . Sexual activity: Not on file   Other Topics Concern  . 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 Isovue [Iopamidol] Itching    Pt states she had itching after CT in 2015.  Marland Kitchen Penicillins Swelling      . Sulfa Antibiotics Itching    Family History  Problem Relation Age of Onset  . Heart failure Mother   . Heart disease Mother   . Diabetes Mother   . Hypertension Mother   . Hypertension Father   . Diabetes Maternal Grandmother   .  Hypertension Sister   . Hypertension Brother   . Cancer Brother        Prostate  . Hypertension Sister       Prior to Admission medications   Medication Sig Start Date End Date Taking? Authorizing Provider  amLODipine (NORVASC) 5 MG tablet Take 5 mg by mouth daily.   Yes [provider]  Ascorbic Acid (VITAMIN C) 1000 MG tablet Take 1,000 mg by mouth every morning.    Yes [provider]  ASHWAGANDHA PO Take by mouth.   Yes [provider]  B Complex-C (B-COMPLEX WITH VITAMIN C) tablet Take 1 tablet by mouth daily.     Yes [provider]  Cholecalciferol (VITAMIN D PO) Take 1,000 Units by mouth daily.   Yes [provider]  cloNIDine (CATAPRES) 0.1 MG tablet Take 0.1 mg by mouth as needed (for high blood pressure).   Yes [provider]  LevOCARNitine (L-CARNITINE PO) Take 800 mg by mouth every morning.    Yes [provider]  MULTIPLE VITAMIN PO Take 1 tablet by mouth daily.    Yes [provider]  NATTOKINASE PO Take 1 tablet by mouth every morning. Helps blood flow   Yes [provider]  nebivolol (BYSTOLIC) 10 MG tablet Take 1 tablet (10 mg total) by mouth daily. 07/25/15  Yes Rollene RotundaHochrein, James, MD  NON FORMULARY Barley Green:  Takes 1 tsp daily   Yes [provider]  NON FORMULARY Chlorella:  Takes 15 capsules daily   Yes [provider]  POTASSIUM PO Take 1 tablet by mouth daily.   Yes [provider]  Probiotic Product (PROBIOTIC PO) Take 1 tablet by mouth daily. Bifido Beadlet 50+   Yes [provider]  SPIRULINA PO Take 1 teaspoon by mouth daily   Yes [provider]  amLODipine (NORVASC) 5 MG tablet TAKE 2 TABLETS DAILY Patient not taking: Reported on 09/28/2016 09/26/15   Rollene RotundaHochrein, James, MD    Physical Exam: Vitals:   09/28/16 0730 09/28/16 0800 09/28/16 0830 09/28/16 0900  BP: (!) 179/92 (!) 162/87 (!) 176/100 (!) 177/103  Pulse: 76 77 73 80  Resp: (!) 23 19 10 19   SpO2: 98% 96% 99% 97%  Weight:      Height:         General:  Appears calm and comfortable Eyes:  PERRL, EOMI, normal lids, iris ENT:  grossly normal hearing, lips & tongue, mmm Neck:  no LAD, masses or thyromegaly Cardiovascular:  RRR, no m/r/g. No LE edema.  Respiratory:  CTA bilaterally, no w/r/r. Normal respiratory effort. Abdomen:  soft, ntnd, NABS Skin:  no rash or induration seen on limited exam Musculoskeletal:  grossly normal tone BUE/BLE, good ROM, no bony abnormality Psychiatric:  grossly normal mood and affect, speech fluent and appropriate, AOx3 Neurologic:  CN 2-12 grossly intact, moves all extremities in coordinated  fashion, Upper extremity, lower extremity, facial sensation intact. 5 out of 5 strength bilaterally, 5 out of 5 hip strength with flexion bilaterally. Patient endorsing right hand digit sensation of pebbles with palpation.  Labs on Admission: I have personally reviewed following labs and imaging studies  CBC:  Recent Labs Lab 09/28/16 0539 09/28/16 0552  WBC 10.9*  --   NEUTROABS 6.7  --   HGB 15.0 15.6*  HCT 43.8 46.0  MCV 91.3  --   PLT 193  --    Basic Metabolic Panel:  Recent Labs Lab 09/28/16 0539 09/28/16 0552  NA 140  143  K 3.5 3.3*  CL 107 104  CO2 25  --   GLUCOSE 105* 106*  BUN 16 18  CREATININE 1.08* 1.00  CALCIUM 9.1  --    GFR: Estimated Creatinine Clearance: 58.8 mL/min (by C-G formula based on SCr of 1 mg/dL). Liver Function Tests:  Recent Labs Lab 09/28/16 0539  AST 40  ALT 17  ALKPHOS 74  BILITOT 1.5*  PROT 7.4  ALBUMIN 4.1   No results for input(s): LIPASE, AMYLASE in the last 168 hours. No results for input(s): AMMONIA in the last 168 hours. Coagulation Profile:  Recent Labs Lab 09/28/16 0539  INR 0.99   Cardiac Enzymes: No results for input(s): CKTOTAL, CKMB, CKMBINDEX, TROPONINI in the last 168 hours. BNP (last 3 results) No results for input(s): PROBNP in the last 8760 hours. HbA1C: No results for input(s): HGBA1C in the last 72 hours. CBG: No results for input(s): GLUCAP in the last 168 hours. Lipid Profile: No results for input(s): CHOL, HDL, LDLCALC, TRIG, CHOLHDL, LDLDIRECT in the last 72 hours. Thyroid Function Tests: No results for input(s): TSH, T4TOTAL, FREET4, T3FREE, THYROIDAB in the last 72 hours. Anemia Panel: No results for input(s): VITAMINB12, FOLATE, FERRITIN, TIBC, IRON, RETICCTPCT in the last 72 hours. Urine analysis:    Component Value Date/Time   COLORURINE YELLOW 06/26/2015 0005   APPEARANCEUR CLEAR 06/26/2015 0005   LABSPEC 1.002 (L) 06/26/2015 0005   PHURINE 7.0 06/26/2015 0005   GLUCOSEU NEGATIVE  06/26/2015 0005   HGBUR NEGATIVE 06/26/2015 0005   BILIRUBINUR NEGATIVE 06/26/2015 0005   BILIRUBINUR negative 01/18/2015 1446   KETONESUR NEGATIVE 06/26/2015 0005   PROTEINUR NEGATIVE 06/26/2015 0005   UROBILINOGEN 0.2 01/24/2015 1736   NITRITE NEGATIVE 06/26/2015 0005   LEUKOCYTESUR SMALL (A) 06/26/2015 0005    Creatinine Clearance: Estimated Creatinine Clearance: 58.8 mL/min (by C-G formula based on SCr of 1 mg/dL).  Sepsis Labs: @LABRCNTIP (procalcitonin:4,lacticidven:4) )No results found for this or any previous visit (from the past 240 hour(s)).   Radiological Exams on Admission: Dg Chest 2 View  Result Date: 09/28/2016 CLINICAL DATA:  Cough. EXAM: CHEST  2 VIEW COMPARISON:  12/29/2014. FINDINGS: Mediastinum and hilar structures normal. Heart size stable. No focal infiltrate. No pleural effusion or pneumothorax. IMPRESSION: No acute cardiopulmonary disease. Electronically Signed   By: Maisie Fus  Register   On: 09/28/2016 07:00   Ct Head Wo Contrast  Result Date: 09/28/2016 CLINICAL DATA:  66 year old female with short episode of right sided weakness. EXAM: CT HEAD WITHOUT CONTRAST TECHNIQUE: Contiguous axial images were obtained from the base of the skull through the vertex without intravenous contrast. COMPARISON:  Head CT dated 05/23/2013 an MRI dated 05/23/2013 FINDINGS: Brain: There is mild age-related atrophy. Moderate periventricular and deep white matter chronic disease may represent chronic microvascular ischemic changes or demyelinating disease or vasculitis. There is no acute intracranial hemorrhage. No mass effect or midline shift noted. No extra-axial fluid collection. Vascular: No hyperdense vessel or unexpected calcification. Skull: Normal. Negative for fracture or focal lesion. Sinuses/Orbits: No acute finding. Other: None IMPRESSION: 1. No acute intracranial hemorrhage. 2. Moderate white matter disease as described. If symptoms persist, and there are no contraindications,  MRI may provide better evaluation if clinically indicated. Electronically Signed   By: Elgie Collard M.D.   On: 09/28/2016 06:51    EKG: Independently reviewed. Sinus. LBBB (partially seen previously on EKG)  Assessment/Plan Active Problems:   History of CVA (cerebrovascular accident)   Left bundle branch block   Essential hypertension  TIA (transient ischemic attack)   Right sided weakness   TIA/Stroke: CT head w/o acute process. Currently pt w/ completely resolved sx w/ the exception of R finger change in sensation. Pt states she does have intermittent R sided numbness of her face, arm, and leg at baseline since her previous stroke in 2010.  - MRI/MRA - Carotid dopplers - Echo - Neuro checks - PT/OT - Neuro orderset - f/u Neuro recs - Permissive HTN  HTN: - hold home norvasc, clonidine, bystolic - Hydralazine for pressure >210/110  Homeopathic therapies: pt on numerous vitamins and probiotics which she seems to change on a regular basis.  - Will hold all current vitamin and probiotic regimens.   Thyroid dysfuntion:  - TSH/T4  LBBB: complete, previously intermittent. No current ACS complaints - Tele, EKG in am, Trop x1   DVT prophylaxis: lovenox  Code Status: full  Family Communication: aunt  Disposition Plan: pending workup  Consults called: neuro  Admission status: observation    MERRELL, DAVID J MD [facility]  If 7PM-7AM, please contact night-coverage [email or web address] Password [email or web address]  09/28/2016, 10:09 AM

## 2016-09-28 NOTE — ED Triage Notes (Signed)
Patient arrives from home with complaint of fall tonight. States she got up to use restroom, felt dizzy, and fell sideways. Denies LOC. After fall states she felt weak on her right and had difficulty getting up. Eventually was able to get up but states her right arm and leg continued to feel weak. Hx of stroke with residual right sided weakness. EMS arrived to find patient able to ambulate and move freely. Negative stroke screening for EMS. Hypertensive for EMS with SBP up to 260.

## 2016-09-28 NOTE — Progress Notes (Signed)
Pt arrived at 11:50 am from ED report called in prior to pt arrival. Pt stable, neuro intact. No noted distress. Pt denies pain, some tingling to left leg radiating to left ankle. Telemetry monitoring. Pt oriented to room. Safety measures in place. Family at bedside. Call bell within reach. Will continue to monitor.

## 2016-09-28 NOTE — ED Provider Notes (Signed)
[facility]-EMERGENCY DEPT Provider Note   CSN: [id number] Arrival date & time: 09/28/16  0502     History   Chief Complaint Chief Complaint  Patient presents with  . Weakness  . Fall    HPI [name] is a 66 y.o. female with history of stroke in 2010 who presents following an episode of right-sided weakness. Patient reports that she was laying in bed and needed to go the restroom. Patient got out of bed and grossly in the bed and felt that she cannot control her right side. Patient fell into the wall. She did not hit her head or lose consciousness. She did not have any injuries. Patient was able to return to baseline regarding walking and get her phone. Patient still has some numbness and tingling to the right side of her face and tongue. She does feel like her strength is returning her right side. Patient reports the numbness in her face does feel similar to her last stroke. Patient also reports a cough for the past few days. Patient states she feels like her "heart flips"and she has to cough to catch up. She denies any chest pain, shortness of breath, new abdominal pain, nausea, vomiting, urinary symptoms. Patient reports intermittent gas pains and known diverticulitis that she is treating with probiotics.  HPI  Past Medical History:  Diagnosis Date  . Arthritis   . Chicken pox   . Depression   . Diverticulitis   . Hypertension   . Seasonal allergies   . Stroke (HCC) 09/2008  . Thyroid disease     Patient Active Problem List   Diagnosis Date Noted  . TIA (transient ischemic attack) 09/28/2016  . Special screening for malignant neoplasms, colon 03/05/2015  . Change in bowel habits 03/05/2015  . CAD (coronary artery disease) 09/25/2014  . Elevated hemoglobin (HCC) 05/24/2014  . Pulse slow 04/26/2014  . Elevated urinary free cortisol level 03/25/2014  . Low serum potassium level 03/25/2014  . Hypertensive heart disease without heart failure 12/22/2013  . Essential  hypertension 12/21/2013  . PVC (premature ventricular contraction) 09/21/2013  . Death of family member 09/03/13  . Left bundle branch block 07/25/2013  . Right thyroid nodule 07/18/2013  . Fatigue 07/18/2013  . Disorder of tooth 07/18/2013  . Autoimmune thyroiditis 06/24/2013  . Elevated TSH 06/19/2013  . Goiter 06/19/2013  . Left bundle branch block (LBBB) on electrocardiogram 06/19/2013  . Hypertension 06/19/2013  . Snoring 06/19/2013  . Alternative medicine 06/19/2013  . Hypertensive urgency 05/23/2013  . History of CVA (cerebrovascular accident) 05/23/2013  . Abdominal discomfort 05/23/2013  . DYSLIPIDEMIA 10/24/2009  . OBESITY, UNSPECIFIED 12/04/2008  . AMI, NON-Q WAVE, INITIAL EPISODE 12/04/2008  . HYPERTENSION 12/02/2008  . ALLERGIC RHINITIS 12/02/2008    Past Surgical History:  Procedure Laterality Date  . MINOR BREAST BIOPSY  1979  . WISDOM TOOTH EXTRACTION      OB History    Gravida Para Term Preterm AB Living   2         2   SAB TAB Ectopic Multiple Live Births                   Home Medications    Prior to Admission medications   Medication Sig Start Date End Date Taking? Authorizing Provider  amLODipine (NORVASC) 5 MG tablet Take 5 mg by mouth daily.   Yes [provider]  Ascorbic Acid (VITAMIN C) 1000 MG tablet Take 1,000 mg by mouth every morning.  Yes [provider]  ASHWAGANDHA PO Take by mouth.   Yes [provider]  B Complex-C (B-COMPLEX WITH VITAMIN C) tablet Take 1 tablet by mouth daily.     Yes [provider]  Cholecalciferol (VITAMIN D PO) Take 1,000 Units by mouth daily.   Yes [provider]  cloNIDine (CATAPRES) 0.1 MG tablet Take 0.1 mg by mouth as needed (for high blood pressure).   Yes [provider]  LevOCARNitine (L-CARNITINE PO) Take 800 mg by mouth every morning.    Yes [provider]  MULTIPLE VITAMIN PO Take 1 tablet by mouth daily.    Yes [provider]  NATTOKINASE PO Take 1 tablet by mouth every morning. Helps blood flow   Yes [provider]  nebivolol (BYSTOLIC) 10 MG tablet Take 1 tablet (10 mg total) by mouth daily. 07/25/15  Yes Rollene Rotunda, MD  NON FORMULARY Barley Green:  Takes 1 tsp daily   Yes [provider]  NON FORMULARY Chlorella:  Takes 15 capsules daily   Yes [provider]  POTASSIUM PO Take 1 tablet by mouth daily.   Yes [provider]  Probiotic Product (PROBIOTIC PO) Take 1 tablet by mouth daily. Bifido Beadlet 50+   Yes [provider]  SPIRULINA PO Take 1 teaspoon by mouth daily   Yes [provider]  amLODipine (NORVASC) 5 MG tablet TAKE 2 TABLETS DAILY Patient not taking: Reported on 09/28/2016 09/26/15   Rollene Rotunda, MD    Family History Family History  Problem Relation Age of Onset  . Heart failure Mother   . Heart disease Mother   . Diabetes Mother   . Hypertension Mother   . Hypertension Father   . Diabetes Maternal Grandmother   . Hypertension Sister   . Hypertension Brother   . Cancer Brother        Prostate  . Hypertension Sister     Social History Social History  Substance Use Topics  . Smoking status: Never Smoker  . Smokeless tobacco: Not on file  . Alcohol use Yes     Comment: Social Use only     Allergies   Isovue [iopamidol]; Penicillins; and Sulfa antibiotics   Review of Systems Review of Systems  Constitutional: Negative for chills and fever.  HENT: Negative for facial swelling and sore throat.   Respiratory: Positive for cough. Negative for shortness of breath.   Cardiovascular: Negative for chest pain.  Gastrointestinal: Positive for abdominal pain (at baseline). Negative for nausea and vomiting.  Genitourinary: Negative for dysuria.  Musculoskeletal: Negative for back pain.  Skin: Negative for rash and wound.  Neurological: Positive for weakness. Negative for dizziness, light-headedness and  headaches.  Psychiatric/Behavioral: The patient is not nervous/anxious.      Physical Exam Updated Vital Signs BP (!) 177/103   Pulse 80   Resp 19   Ht 5\' 4"  (1.626 m)   Wt 83.9 kg (185 lb)   SpO2 97%   BMI 31.76 kg/m   Physical Exam  Constitutional: She appears well-developed and well-nourished. No distress.  HENT:  Head: Normocephalic and atraumatic.  Mouth/Throat: Oropharynx is clear and moist. No oropharyngeal exudate.  Eyes: Pupils are equal, round, and reactive to light. Conjunctivae are normal. Right eye exhibits no discharge. Left eye exhibits no discharge. No scleral icterus.  Neck: Normal range of motion. Neck supple. No thyromegaly present.  Cardiovascular: Normal rate, regular rhythm, normal heart sounds and intact distal pulses.  Exam reveals no  gallop and no friction rub.   No murmur heard. Pulmonary/Chest: Effort normal and breath sounds normal. No stridor. No respiratory distress. She has no wheezes. She has no rales.  Abdominal: Soft. Bowel sounds are normal. She exhibits no distension. There is no tenderness. There is no rebound and no guarding.  Musculoskeletal: She exhibits no edema.  Lymphadenopathy:    She has no cervical adenopathy.  Neurological: She is alert. Coordination normal.  CN 3-12 intact; normal sensation throughout; 5/5 strength in all 4 extremities; equal bilateral grip strength; no ataxia on finger to nose  Skin: Skin is warm and dry. No rash noted. She is not diaphoretic. No pallor.  Psychiatric: She has a normal mood and affect.  Nursing note and vitals reviewed.    ED Treatments / Results  Labs (all labs ordered are listed, but only abnormal results are displayed) Labs Reviewed  CBC - Abnormal; Notable for the following:       Result Value   WBC 10.9 (*)    All other components within normal limits  COMPREHENSIVE METABOLIC PANEL - Abnormal; Notable for the following:    Glucose, Bld 105 (*)    Creatinine, Ser 1.08 (*)    Total  Bilirubin 1.5 (*)    GFR calc non Af Amer 53 (*)    All other components within normal limits  I-STAT CHEM 8, ED - Abnormal; Notable for the following:    Potassium 3.3 (*)    Glucose, Bld 106 (*)    Calcium, Ion 1.12 (*)    Hemoglobin 15.6 (*)    All other components within normal limits  PROTIME-INR  APTT  DIFFERENTIAL  I-STAT TROPOININ, ED  CBG MONITORING, ED    EKG  EKG Interpretation  Date/Time:  Tuesday September 28 2016 05:15:31 EDT Ventricular Rate:  76 PR Interval:    QRS Duration: 171 QT Interval:  433 QTC Calculation: 487 R Axis:   -32 Text Interpretation:  Sinus rhythm Borderline prolonged PR interval Probable left atrial enlargement Left bundle branch block Confirmed by Rochele Raring [phone]) on 09/28/2016 5:34:34 AM       Radiology Dg Chest 2 View  Result Date: 09/28/2016 CLINICAL DATA:  Cough. EXAM: CHEST  2 VIEW COMPARISON:  12/29/2014. FINDINGS: Mediastinum and hilar structures normal. Heart size stable. No focal infiltrate. No pleural effusion or pneumothorax. IMPRESSION: No acute cardiopulmonary disease. Electronically Signed   By: Maisie Fus  Register   On: 09/28/2016 07:00   Ct Head Wo Contrast  Result Date: 09/28/2016 CLINICAL DATA:  66 year old female with short episode of right sided weakness. EXAM: CT HEAD WITHOUT CONTRAST TECHNIQUE: Contiguous axial images were obtained from the base of the skull through the vertex without intravenous contrast. COMPARISON:  Head CT dated 05/23/2013 an MRI dated 05/23/2013 FINDINGS: Brain: There is mild age-related atrophy. Moderate periventricular and deep white matter chronic disease may represent chronic microvascular ischemic changes or demyelinating disease or vasculitis. There is no acute intracranial hemorrhage. No mass effect or midline shift noted. No extra-axial fluid collection. Vascular: No hyperdense vessel or unexpected calcification. Skull: Normal. Negative for fracture or focal lesion. Sinuses/Orbits: No acute  finding. Other: None IMPRESSION: 1. No acute intracranial hemorrhage. 2. Moderate white matter disease as described. If symptoms persist, and there are no contraindications, MRI may provide better evaluation if clinically indicated. Electronically Signed   By: Elgie Collard M.D.   On: 09/28/2016 06:51    Procedures Procedures (including critical care time)  Medications Ordered in ED Medications - No  data to display   Initial Impression / Assessment and Plan / ED Course  I have reviewed the triage vital signs and the nursing notes.  Pertinent labs & imaging results that were available during my care of the patient were reviewed by me and considered in my medical decision making (see chart for details).     CBC shows WBC 10.9. CMP shows creatinine 1.08, total bilirubin 1.5. PT/INR, APTT within normal limits. Troponin negative. CXR shows no acute cardio pulmonary disease. CT head shows no acute hemorrhage; moderate white matter disease. Concern for TIA. I spoke with Dr. Amada JupiterKirkpatrick with neurology who agrees patient should be admitted for TIA workup. I consulted [facility] and spoke with Dr. Konrad DoloresMerrell who will admit the patient. Patient understands and agrees with plan. Patient also evaluated by Dr. Clarene DukeLittle who agrees with plan.   Final Clinical Impressions(s) / ED Diagnoses   Final diagnoses:  Weakness    New Prescriptions New Prescriptions   No medications on file     Verdis PrimeLaw, Rainey Rodger M, PA-C 09/28/16 0940    Shon BatonHorton, Courtney F, MD 10/03/16 [phone]

## 2016-09-28 NOTE — Consult Note (Signed)
Requesting Physician: Dr. Konrad Dolores    Chief Complaint: Right arm and leg weakness  History obtained from: Patient and Chart   HPI:                                                                                                                                      [name] is an 66 y.o. female with a PMH of CVA (2010) and  HTN, presented to [facility] the morning of 7/17 following a fall at home. She stated that she felt very weak, "all [my] power went out", and fell sideways on a wall after getting up to use the restroom around 0300. She denies any head trauma. She noted that her right arm and leg continued to feel weak for 2-3 minutes after the event, following which she was able to get up and get her phone. She was able to move and ambulate "normally" by the time EMS arrived.  She states that she has had 2 or 3 events of right-sided weakness since arriving to [facility]. She had no symptoms on my visit.  Patient stated that she does not have residual weakness or sensory loss following her previous stroke. She does state that her right hand and foot, namely her fingertips and toes, feel like there are "pebbles" in them, and she does have some right-perioral and right tongue tingling when her blood pressure gets too high.  Pertinent Imaging: CT head 7/17: No acute hemorrhage. Moderate white matter disease. MRI pending  Date last known well: Date: 09/27/2016 Time last known well: Unable to determine  tPA Given: No: Symptoms resolved  Modified Rankin Score: 1  Stroke Risk Factors - hypertension   Past Medical History:  Diagnosis Date  . Arthritis   . Chicken pox   . Depression   . Diverticulitis   . Hypertension   . Seasonal allergies   . Stroke (HCC) 09/2008  . Thyroid disease     Past Surgical History:  Procedure Laterality Date  . MINOR BREAST BIOPSY  1979  . WISDOM TOOTH EXTRACTION      Family History  Problem Relation Age of Onset  . Heart failure Mother   . Heart  disease Mother   . Diabetes Mother   . Hypertension Mother   . Hypertension Father   . Diabetes Maternal Grandmother   . Hypertension Sister   . Hypertension Brother   . Cancer Brother        Prostate  . Hypertension Sister      reports that she has never smoked. She does not have any smokeless tobacco history on file. She reports that she drinks alcohol. She reports that she does not use drugs.  Allergies  Allergen Reactions  . Isovue [Iopamidol] Itching    Pt states she had itching after CT in 2015.  Marland Kitchen Penicillins Swelling      . Sulfa Antibiotics Itching    Medications:  Current Meds  Medication Sig  . amLODipine (NORVASC) 5 MG tablet Take 5 mg by mouth daily.  . Ascorbic Acid (VITAMIN C) 1000 MG tablet Take 1,000 mg by mouth every morning.   . ASHWAGANDHA PO Take by mouth.  . B Complex-C (B-COMPLEX WITH VITAMIN C) tablet Take 1 tablet by mouth daily.    . Cholecalciferol (VITAMIN D PO) Take 1,000 Units by mouth daily.  . cloNIDine (CATAPRES) 0.1 MG tablet Take 0.1 mg by mouth as needed (for high blood pressure).  . LevOCARNitine (L-CARNITINE PO) Take 800 mg by mouth every morning.   . MULTIPLE VITAMIN PO Take 1 tablet by mouth daily.   Marland Kitchen. NATTOKINASE PO Take 1 tablet by mouth every morning. Helps blood flow  . nebivolol (BYSTOLIC) 10 MG tablet Take 1 tablet (10 mg total) by mouth daily.  . NON FORMULARY Barley Green:  Takes 1 tsp daily  . NON FORMULARY Chlorella:  Takes 15 capsules daily  . POTASSIUM PO Take 1 tablet by mouth daily.  . Probiotic Product (PROBIOTIC PO) Take 1 tablet by mouth daily. Bifido Beadlet 50+  . SPIRULINA PO Take 1 teaspoon by mouth daily    Review Of Systems:                                                                                                           History obtained from the patient  General: Positive for  fatigue Psychological: Negative for any known behavioral disorder Ophthalmic: Negative for blurry or double vision, loss of vision in any field or eye pain ENT: Negative for abrupt loss of hearing, tinnitus or vertigo Respiratory: Positive for cough x 3 days. Denies SOB, DOE Cardiovascular: Occasional palpitations. Denies chest pain Gastrointestinal: Negative for abdominal pain Genito-Urinary: Negative for dysuria Musculoskeletal: Negative for joint swelling or muscular weakness Neurological: As noted in HPI Dermatological: Negative for rash or skin changes, numbness or tingling  Blood pressure (!) 176/100, pulse 73, resp. rate 10, height 5\' 4"  (1.626 m), weight 83.9 kg (185 lb), SpO2 99 %.   Physical Examination:                                                                                                      General: WDWN female.  HEENT:  Normocephalic, no lesions, without obvious abnormality.  Normal external eye and conjunctiva.  Normal TM's bilaterally.  Normal auditory canals and external ears. Normal external nose, mucus membranes and septum.  Normal pharynx. Cardiovascular: S1, S2 normal, pulses palpable throughout   Pulmonary: chest clear, no wheezing, rales, normal symmetric air entry Abdomen: soft, non-tender Extremities: no joint deformities, effusion, or inflammation  Musculoskeletal: no joint tenderness, deformity or swelling Skin: warm and dry, no hyperpigmentation, vitiligo, or suspicious lesions  Neurological Examination:                                                                                               Mental Status: [name] is alert, oriented, thought content appropriate.  Speech fluent without evidence of aphasia. Able to follow 3-step commands without difficulty. Cranial Nerves: II: Visual fields grossly normal, pupils are equal, round, reactive to light. III,IV, VI: Ptosis not present, extra-ocular muscle movements intact  bilaterally V,VII: Smile and eyebrow raise is symmetric. Facial light touch and pinprick sensation intact bilaterally VIII: Hearing grossly intact IX,X: Uvula and palate rise symmetrically XI: SCM and bilateral shoulder shrug strength 5/5 XII: Midline tongue extension Motor: Right :     Upper extremity   5/5   Left:     Upper extremity   5/5          Lower extremity   5/5    Lower extremity   5/5 Pronator drift not present Sensory: Pinprick and light touch intact throughout, bilaterally Deep Tendon Reflexes: 2+ and symmetric throughout Plantars: Right: downgoing   Left: downgoing Cerebellar: Finger-to-nose test without evidence of dysmetria or ataxia Gait: Not tested  Lab Results: Basic Metabolic Panel:  Recent Labs Lab 09/28/16 0539 09/28/16 0552  NA 140 143  K 3.5 3.3*  CL 107 104  CO2 25  --   GLUCOSE 105* 106*  BUN 16 18  CREATININE 1.08* 1.00  CALCIUM 9.1  --     Liver Function Tests:  Recent Labs Lab 09/28/16 0539  AST 40  ALT 17  ALKPHOS 74  BILITOT 1.5*  PROT 7.4  ALBUMIN 4.1   No results for input(s): LIPASE, AMYLASE in the last 168 hours. No results for input(s): AMMONIA in the last 168 hours.  CBC:  Recent Labs Lab 09/28/16 0539 09/28/16 0552  WBC 10.9*  --   NEUTROABS 6.7  --   HGB 15.0 15.6*  HCT 43.8 46.0  MCV 91.3  --   PLT 193  --     Cardiac Enzymes: No results for input(s): CKTOTAL, CKMB, CKMBINDEX, TROPONINI in the last 168 hours.  Lipid Panel: No results for input(s): CHOL, TRIG, HDL, CHOLHDL, VLDL, LDLCALC in the last 168 hours.  CBG: No results for input(s): GLUCAP in the last 168 hours.  Microbiology: Results for orders placed or performed during the hospital encounter of 01/24/15  Stool culture     Status: None   Collection Time: 01/24/15 10:56 PM  Result Value Ref Range Status   Specimen Description STOOL  Final   Special Requests NONE  Final   Culture   Final    NO SALMONELLA, SHIGELLA, CAMPYLOBACTER, YERSINIA,  OR E.COLI 0157:H7 ISOLATED Performed at [facility]    Report Status 01/28/2015 FINAL  Final  C difficile quick scan w PCR reflex     Status: None   Collection Time: 01/24/15 10:56 PM  Result Value Ref Range Status   C Diff antigen NEGATIVE NEGATIVE Final   C Diff toxin  NEGATIVE NEGATIVE Final   C Diff interpretation Negative for toxigenic C. difficile  Final    Coagulation Studies:  Recent Labs  09/28/16 0539  LABPROT 13.1  INR 0.99    Imaging: Dg Chest 2 View  Result Date: 09/28/2016 CLINICAL DATA:  Cough. EXAM: CHEST  2 VIEW COMPARISON:  12/29/2014. FINDINGS: Mediastinum and hilar structures normal. Heart size stable. No focal infiltrate. No pleural effusion or pneumothorax. IMPRESSION: No acute cardiopulmonary disease. Electronically Signed   By: Maisie Fus  Register   On: 09/28/2016 07:00   Ct Head Wo Contrast  Result Date: 09/28/2016 CLINICAL DATA:  66 year old female with short episode of right sided weakness. EXAM: CT HEAD WITHOUT CONTRAST TECHNIQUE: Contiguous axial images were obtained from the base of the skull through the vertex without intravenous contrast. COMPARISON:  Head CT dated 05/23/2013 an MRI dated 05/23/2013 FINDINGS: Brain: There is mild age-related atrophy. Moderate periventricular and deep white matter chronic disease may represent chronic microvascular ischemic changes or demyelinating disease or vasculitis. There is no acute intracranial hemorrhage. No mass effect or midline shift noted. No extra-axial fluid collection. Vascular: No hyperdense vessel or unexpected calcification. Skull: Normal. Negative for fracture or focal lesion. Sinuses/Orbits: No acute finding. Other: None IMPRESSION: 1. No acute intracranial hemorrhage. 2. Moderate white matter disease as described. If symptoms persist, and there are no contraindications, MRI may provide better evaluation if clinically indicated. Electronically Signed   By: Elgie Collard M.D.   On: 09/28/2016 06:51      Assessment and plan per attending neurologist.  Leonel Ramsay PA-C [facility] [phone]  09/28/2016, 9:13 AM   I have seen the patient and read the above note. She has had recurrent right-sided weakness over the course of the day. She denies any involuntary movements, stating that it just felt like it was "not her arm" during these episodes but rather just "dead flesh."  Assessment and Plan:  66 year old female with recurrent right-sided weakness concerning for capsular warning syndrome. I would favor dual antiplatelet therapy in this scenario, and I have ordered this.   1. HgbA1c, fasting lipid panel 2. MRI, MRA  of the brain without contrast 3. PT consult, OT consult, Speech consult 4. Echocardiogram 5. Carotid dopplers 6. Prophylactic therapy-aspirin and Plavix, following Plavix load 7. Risk factor modification 8. Telemetry monitoring 9. Frequent neuro checks 10 NPO until passes stroke swallow screen 11 Please page stroke NP  Or  PA  Or MD from 8am -4 pm for follow up questions.  You can look them up on [email or web address]  Password [email or web address]   Ritta Slot, MD [facility] [phone]  If 7pm- 7am, please page neurology on call as listed in AMION.

## 2016-09-28 NOTE — ED Notes (Signed)
Admitting at bedside 

## 2016-09-29 ENCOUNTER — Other Ambulatory Visit (HOSPITAL_COMMUNITY)

## 2016-09-29 ENCOUNTER — Encounter (HOSPITAL_COMMUNITY)

## 2016-09-29 ENCOUNTER — Observation Stay (HOSPITAL_BASED_OUTPATIENT_CLINIC_OR_DEPARTMENT_OTHER): Payer: Medicare Other

## 2016-09-29 DIAGNOSIS — N183 Chronic kidney disease, stage 3 (moderate): Secondary | ICD-10-CM | POA: Diagnosis not present

## 2016-09-29 DIAGNOSIS — I10 Essential (primary) hypertension: Secondary | ICD-10-CM | POA: Diagnosis not present

## 2016-09-29 DIAGNOSIS — G459 Transient cerebral ischemic attack, unspecified: Secondary | ICD-10-CM

## 2016-09-29 DIAGNOSIS — Z8673 Personal history of transient ischemic attack (TIA), and cerebral infarction without residual deficits: Secondary | ICD-10-CM | POA: Diagnosis not present

## 2016-09-29 DIAGNOSIS — R531 Weakness: Secondary | ICD-10-CM | POA: Diagnosis not present

## 2016-09-29 DIAGNOSIS — W19XXXA Unspecified fall, initial encounter: Secondary | ICD-10-CM | POA: Diagnosis not present

## 2016-09-29 DIAGNOSIS — F419 Anxiety disorder, unspecified: Secondary | ICD-10-CM

## 2016-09-29 LAB — COMPREHENSIVE METABOLIC PANEL
ALBUMIN: 3.6 g/dL (ref 3.5–5.0)
ALK PHOS: 48 U/L (ref 38–126)
ALT: 16 U/L (ref 14–54)
AST: 24 U/L (ref 15–41)
Anion gap: 6 (ref 5–15)
BUN: 13 mg/dL (ref 6–20)
CALCIUM: 8.9 mg/dL (ref 8.9–10.3)
CHLORIDE: 107 mmol/L (ref 101–111)
CO2: 26 mmol/L (ref 22–32)
CREATININE: 1.23 mg/dL — AB (ref 0.44–1.00)
GFR calc Af Amer: 52 mL/min — ABNORMAL LOW (ref >60)
GFR calc non Af Amer: 45 mL/min — ABNORMAL LOW (ref >60)
GLUCOSE: 99 mg/dL (ref 65–99)
Potassium: 3.2 mmol/L — ABNORMAL LOW (ref 3.5–5.1)
SODIUM: 139 mmol/L (ref 135–145)
Total Bilirubin: 2 mg/dL — ABNORMAL HIGH (ref 0.3–1.2)
Total Protein: 6.5 g/dL (ref 6.5–8.1)

## 2016-09-29 LAB — ECHOCARDIOGRAM COMPLETE
CHL CUP MV DEC (S): 194
CHL CUP TV REG PEAK VELOCITY: 216 cm/s
EERAT: 23.23
EWDT: 194 ms
FS: 24 % — AB (ref 28–44)
Height: 63 in
IVS/LV PW RATIO, ED: 1.17
LA diam end sys: 34 mm
LA vol A4C: 68.5 ml
LA vol: 65.1 mL
LADIAMINDEX: 1.81 cm/m2
LASIZE: 34 mm
LAVOLIN: 34.6 mL/m2
LV TDI E'LATERAL: 4.24
LV TDI E'MEDIAL: 3.59
LV e' LATERAL: 4.24 cm/s
LVEEAVG: 23.23
LVEEMED: 23.23
LVOT SV: 61 mL
LVOT VTI: 21.6 cm
LVOT area: 2.84 cm2
LVOTD: 19 mm
LVOTPV: 103 cm/s
Lateral S' vel: 10.3 cm/s
MV Peak grad: 4 mmHg
MV pk A vel: 135 m/s
MVPKEVEL: 98.5 m/s
PW: 11.4 mm — AB (ref 0.6–1.1)
RV TAPSE: 19.1 mm
RV sys press: 22 mmHg
TRMAXVEL: 216 cm/s
Weight: 2980.8 oz

## 2016-09-29 LAB — CBC
HCT: 42 % (ref 36.0–46.0)
HEMOGLOBIN: 13.9 g/dL (ref 12.0–15.0)
MCH: 30.7 pg (ref 26.0–34.0)
MCHC: 33.1 g/dL (ref 30.0–36.0)
MCV: 92.7 fL (ref 78.0–100.0)
PLATELETS: 181 10*3/uL (ref 150–400)
RBC: 4.53 MIL/uL (ref 3.87–5.11)
RDW: 14.1 % (ref 11.5–15.5)
WBC: 8.6 10*3/uL (ref 4.0–10.5)

## 2016-09-29 LAB — LIPID PANEL
CHOL/HDL RATIO: 2.6 ratio
CHOLESTEROL: 174 mg/dL (ref 0–200)
HDL: 67 mg/dL (ref >40)
LDL Cholesterol: 88 mg/dL (ref 0–99)
Triglycerides: 96 mg/dL (ref <150)
VLDL: 19 mg/dL (ref 0–40)

## 2016-09-29 LAB — HIV ANTIBODY (ROUTINE TESTING W REFLEX): HIV Screen 4th Generation wRfx: NONREACTIVE

## 2016-09-29 MED ORDER — CLOPIDOGREL BISULFATE 75 MG PO TABS: 75.0000 mg | ORAL_TABLET | Freq: Every day | ORAL | 3 refills | Status: DC

## 2016-09-29 MED ORDER — AMLODIPINE BESYLATE 5 MG PO TABS: 5.0000 mg | ORAL_TABLET | Freq: Every day | ORAL | Status: DC

## 2016-09-29 MED ORDER — NEBIVOLOL HCL 10 MG PO TABS
10.0000 mg | ORAL_TABLET | Freq: Every day | ORAL | Status: DC
  Administered 2016-09-29 – 2016-09-30 (×2): 10 mg via ORAL
  Filled 2016-09-29 (×2): qty 1

## 2016-09-29 MED ORDER — HYDRALAZINE HCL 10 MG PO TABS: 10.0000 mg | ORAL_TABLET | Freq: Three times a day (TID) | ORAL | 3 refills | Status: DC

## 2016-09-29 MED ORDER — ASPIRIN EC 81 MG PO TBEC
81.0000 mg | DELAYED_RELEASE_TABLET | Freq: Every day | ORAL | Status: DC
Start: 2016-09-29 — End: 2016-09-30
  Administered 2016-09-29 – 2016-09-30 (×2): 81 mg via ORAL
  Filled 2016-09-29 (×2): qty 1

## 2016-09-29 MED ORDER — NEBIVOLOL HCL 10 MG PO TABS
10.0000 mg | ORAL_TABLET | Freq: Every day | ORAL | Status: DC
  Filled 2016-09-29: qty 1

## 2016-09-29 MED ORDER — NEBIVOLOL HCL 10 MG PO TABS: 10.0000 mg | ORAL_TABLET | Freq: Every day | ORAL | 3 refills | Status: DC

## 2016-09-29 MED ORDER — HYDRALAZINE HCL 25 MG PO TABS
25.0000 mg | ORAL_TABLET | Freq: Three times a day (TID) | ORAL | Status: DC
  Administered 2016-09-29 – 2016-09-30 (×2): 25 mg via ORAL
  Filled 2016-09-29 (×2): qty 1

## 2016-09-29 MED ORDER — HYDRALAZINE HCL 20 MG/ML IJ SOLN
10.0000 mg | INTRAMUSCULAR | Status: DC | PRN
  Administered 2016-09-29: 10 mg via INTRAVENOUS
  Filled 2016-09-29: qty 1

## 2016-09-29 MED ORDER — POTASSIUM CHLORIDE CRYS ER 20 MEQ PO TBCR
40.0000 meq | EXTENDED_RELEASE_TABLET | Freq: Once | ORAL | Status: AC
  Administered 2016-09-29: 40 meq via ORAL
  Filled 2016-09-29: qty 2

## 2016-09-29 MED ORDER — HYDRALAZINE HCL 20 MG/ML IJ SOLN
10.0000 mg | INTRAMUSCULAR | Status: DC | PRN
Start: 2016-09-29 — End: 2016-09-29

## 2016-09-29 MED ORDER — ATORVASTATIN CALCIUM 20 MG PO TABS: 20.0000 mg | ORAL_TABLET | Freq: Every day | ORAL | 3 refills | Status: DC

## 2016-09-29 MED ORDER — ASPIRIN 325 MG PO TABS: 325.0000 mg | ORAL_TABLET | Freq: Every day | ORAL | 3 refills | Status: DC

## 2016-09-29 MED ORDER — HYDRALAZINE HCL 10 MG PO TABS
10.0000 mg | ORAL_TABLET | Freq: Three times a day (TID) | ORAL | Status: DC
  Administered 2016-09-29: 10 mg via ORAL
  Filled 2016-09-29: qty 1

## 2016-09-29 MED ORDER — ATORVASTATIN CALCIUM 10 MG PO TABS: 20.0000 mg | ORAL_TABLET | Freq: Every day | ORAL | Status: DC

## 2016-09-29 NOTE — Progress Notes (Signed)
[facility]                                                                              Patient Demographics  [name], is a 66 y.o. female, DOB - 10/29/1950, ZOX:[id number]  Admit date - 09/28/2016   Admitting Physician Ozella Rocksavid J Merrell, MD  Outpatient Primary MD for the patient is Panosh, Neta MendsWanda K, MD  Outpatient specialists:   LOS - 0  days   Medical records reviewed and are as summarized below:    Chief Complaint  Patient presents with  . Weakness  . Fall       Brief summary   The patient is a 66 year old with history of CVA in 2010 with residual right-sided numbness, thyroid dysfunction, hypertension, diverticulosis, depression. Patient reported several weeks medic complains over the last several days. However a day before the admission, she was ambulating to the bathroom when she had sudden onset of right-sided weakness and numbness. She stumbled on her way to the bathroom running into the wall. Due to the prior history of CVA patient presented to ED for further workup.   Assessment & Plan    Principal problem Right-sided weakness, numbness with multiple somatic complaints possible TIA versus hypertensive urgency with prior history of CVA - MRI negative for acute CVA - MRA with moderate to severe chronic microvascular ischemic change - Carotid Dopplers with 1-39% ICA stenosis - 2-D echo pending - PT with no PT follow-up needed, at baseline - Continue aspirin 81 mg daily  Active Problems:      Essential hypertension/hypertensive urgency - Continue bystolic, patient states that she does not want to be on amlodipine anymore due to side effects - Placed her on hydralazine 25mg  q8hrs (will adjust dose in am) according to overnight readings  Hypokalemia - Replaced  Code Status: Full CODE STATUS DVT Prophylaxis:  Lovenox  Family Communication: Discussed in detail with the patient, all imaging results, lab results explained to the  patient   Disposition Plan:  Time Spent in minutes 35 minutes  Procedures:  MRI, MRA, 2-D echo  Consultants:   Neurology  Antimicrobials:      Medications  Scheduled Meds: . aspirin EC  81 mg Oral Daily  . enoxaparin (LOVENOX) injection  40 mg Subcutaneous Daily  . hydrALAZINE  25 mg Oral Q8H  . nebivolol  10 mg Oral Daily   Continuous Infusions: PRN Meds:.acetaminophen **OR** acetaminophen, hydrALAZINE, ondansetron **OR** ondansetron (ZOFRAN) IV   Antibiotics   Anti-infectives    None        Subjective:   [name] was seen and examined today. Feeling somewhat better this morning Patient denies dizziness, chest pain, shortness of breath, abdominal pain, N/V/D/C, new weakness, numbess, tingling. No acute events overnight.    Objective:   Vitals:   09/29/16 0610 09/29/16 0803 09/29/16 1200 09/29/16 1417  BP: (!) 146/79 (!) 196/99 (!) 178/95 (!) 200/99  Pulse: 61 64 78 72  Resp: 20 20 18 18   Temp: 98.6 F (37 C) 97.7 F (36.5 C) 98.4 F (36.9 C) 98.6 F (37 C)  TempSrc: Oral Axillary Oral Oral  SpO2: 100% 100% 100%  100%  Weight:      Height:       No intake or output data in the 24 hours ending 09/29/16 1537   Wt Readings from Last 3 Encounters:  09/28/16 84.5 kg (186 lb 4.8 oz)  12/11/15 84.5 kg (186 lb 4 oz)  06/27/15 81.3 kg (179 lb 4 oz)     Exam  General: Alert and oriented x 3, NAD  Eyes: PERRLA, EOMI, Anicteric Sclera,  HEENT:  Atraumatic, normocephalic, normal oropharynx  Cardiovascular: S1 S2 auscultated, no rubs, murmurs or gallops. Regular rate and rhythm.  Respiratory: Clear to auscultation bilaterally, no wheezing, rales or rhonchi  Gastrointestinal: Soft, nontender, nondistended, + bowel sounds  Ext: no pedal edema bilaterally  Neuro: AAOx3, Cr N's II- XII. Strength 5/5 upper and lower extremities bilaterally, speech clear, sensations grossly intact  Musculoskeletal: No digital cyanosis, clubbing  Skin:  No rashes  Psych: Normal affect and demeanor, alert and oriented x3    Data Reviewed:  I have personally reviewed following labs and imaging studies  Micro Results No results found for this or any previous visit (from the past 240 hour(s)).  Radiology Reports Dg Chest 2 View  Result Date: 09/28/2016 CLINICAL DATA:  Cough. EXAM: CHEST  2 VIEW COMPARISON:  12/29/2014. FINDINGS: Mediastinum and hilar structures normal. Heart size stable. No focal infiltrate. No pleural effusion or pneumothorax. IMPRESSION: No acute cardiopulmonary disease. Electronically Signed   By: Maisie Fus  Register   On: 09/28/2016 07:00   Ct Head Wo Contrast  Result Date: 09/28/2016 CLINICAL DATA:  66 year old female with short episode of right sided weakness. EXAM: CT HEAD WITHOUT CONTRAST TECHNIQUE: Contiguous axial images were obtained from the base of the skull through the vertex without intravenous contrast. COMPARISON:  Head CT dated 05/23/2013 an MRI dated 05/23/2013 FINDINGS: Brain: There is mild age-related atrophy. Moderate periventricular and deep white matter chronic disease may represent chronic microvascular ischemic changes or demyelinating disease or vasculitis. There is no acute intracranial hemorrhage. No mass effect or midline shift noted. No extra-axial fluid collection. Vascular: No hyperdense vessel or unexpected calcification. Skull: Normal. Negative for fracture or focal lesion. Sinuses/Orbits: No acute finding. Other: None IMPRESSION: 1. No acute intracranial hemorrhage. 2. Moderate white matter disease as described. If symptoms persist, and there are no contraindications, MRI may provide better evaluation if clinically indicated. Electronically Signed   By: Elgie Collard M.D.   On: 09/28/2016 06:51   Mr Maxine Glenn Head Wo Contrast  Result Date: 09/28/2016 CLINICAL DATA:  Right-sided weakness EXAM: MRI HEAD WITHOUT CONTRAST MRA HEAD WITHOUT CONTRAST TECHNIQUE: Multiplanar, multiecho pulse sequences of the  brain and surrounding structures were obtained without intravenous contrast. Angiographic images of the head were obtained using MRA technique without contrast. COMPARISON:  CT 09/28/2016 FINDINGS: MRI HEAD FINDINGS Brain: Negative for acute infarct. Moderately severe changes throughout the cerebral white matter with multiple patchy areas of hyperintensity in the white matter. Hyperintensity left lateral thalamus. Mild disease in the pons. Findings most consistent with chronic microvascular ischemia. Negative for hemorrhage or mass lesion. No shift of the midline structures. Ventricle size normal. Vascular: Normal arterial flow voids Skull and upper cervical spine: Negative Sinuses/Orbits: Negative Other: None MRA HEAD FINDINGS Both vertebral arteries patent to the basilar without stenosis. PICA patent bilaterally. Basilar widely patent. Irregularity in the posterior cerebral artery bilaterally compatible with moderate atherosclerotic disease. No large vessel occlusion Mild irregularity of the internal carotid artery bilaterally without stenosis. Mild irregularity of the anterior cerebral artery bilaterally. Severe  stenosis in the anterior branch of the right middle cerebral artery. Multiple areas of stenosis in the M3 segment of the posterior branch of the right middle cerebral artery. M1 segment patent bilaterally. Mild disease in left MCA branches. Negative for aneurysm IMPRESSION: Negative for acute infarct. Moderate to severe chronic microvascular ischemic change Moderate intracranial atherosclerotic disease without large vessel occlusion Electronically Signed   By: Marlan Palau M.D.   On: 09/28/2016 12:22   Mr Brain Wo Contrast  Result Date: 09/28/2016 CLINICAL DATA:  Right-sided weakness EXAM: MRI HEAD WITHOUT CONTRAST MRA HEAD WITHOUT CONTRAST TECHNIQUE: Multiplanar, multiecho pulse sequences of the brain and surrounding structures were obtained without intravenous contrast. Angiographic images of the  head were obtained using MRA technique without contrast. COMPARISON:  CT 09/28/2016 FINDINGS: MRI HEAD FINDINGS Brain: Negative for acute infarct. Moderately severe changes throughout the cerebral white matter with multiple patchy areas of hyperintensity in the white matter. Hyperintensity left lateral thalamus. Mild disease in the pons. Findings most consistent with chronic microvascular ischemia. Negative for hemorrhage or mass lesion. No shift of the midline structures. Ventricle size normal. Vascular: Normal arterial flow voids Skull and upper cervical spine: Negative Sinuses/Orbits: Negative Other: None MRA HEAD FINDINGS Both vertebral arteries patent to the basilar without stenosis. PICA patent bilaterally. Basilar widely patent. Irregularity in the posterior cerebral artery bilaterally compatible with moderate atherosclerotic disease. No large vessel occlusion Mild irregularity of the internal carotid artery bilaterally without stenosis. Mild irregularity of the anterior cerebral artery bilaterally. Severe stenosis in the anterior branch of the right middle cerebral artery. Multiple areas of stenosis in the M3 segment of the posterior branch of the right middle cerebral artery. M1 segment patent bilaterally. Mild disease in left MCA branches. Negative for aneurysm IMPRESSION: Negative for acute infarct. Moderate to severe chronic microvascular ischemic change Moderate intracranial atherosclerotic disease without large vessel occlusion Electronically Signed   By: Marlan Palau M.D.   On: 09/28/2016 12:22    Lab Data:  CBC:  Recent Labs Lab 09/28/16 0539 09/28/16 0552 09/29/16 0419  WBC 10.9*  --  8.6  NEUTROABS 6.7  --   --   HGB 15.0 15.6* 13.9  HCT 43.8 46.0 42.0  MCV 91.3  --  92.7  PLT 193  --  181   Basic Metabolic Panel:  Recent Labs Lab 09/28/16 0539 09/28/16 0552 09/29/16 0419  NA 140 143 139  K 3.5 3.3* 3.2*  CL 107 104 107  CO2 25  --  26  GLUCOSE 105* 106* 99  BUN 16  18 13   CREATININE 1.08* 1.00 1.23*  CALCIUM 9.1  --  8.9   GFR: Estimated Creatinine Clearance: 46.9 mL/min (A) (by C-G formula based on SCr of 1.23 mg/dL (H)). Liver Function Tests:  Recent Labs Lab 09/28/16 0539 09/29/16 0419  AST 40 24  ALT 17 16  ALKPHOS 74 48  BILITOT 1.5* 2.0*  PROT 7.4 6.5  ALBUMIN 4.1 3.6   No results for input(s): LIPASE, AMYLASE in the last 168 hours. No results for input(s): AMMONIA in the last 168 hours. Coagulation Profile:  Recent Labs Lab 09/28/16 0539  INR 0.99   Cardiac Enzymes:  Recent Labs Lab 09/28/16 1205  TROPONINI <0.03   BNP (last 3 results) No results for input(s): PROBNP in the last 8760 hours. HbA1C: No results for input(s): HGBA1C in the last 72 hours. CBG: No results for input(s): GLUCAP in the last 168 hours. Lipid Profile:  Recent Labs  09/29/16 0419  CHOL 174  HDL 67  LDLCALC 88  TRIG 96  CHOLHDL 2.6   Thyroid Function Tests:  Recent Labs  09/28/16 1205  TSH 3.655  FREET4 0.96   Anemia Panel: No results for input(s): VITAMINB12, FOLATE, FERRITIN, TIBC, IRON, RETICCTPCT in the last 72 hours. Urine analysis:    Component Value Date/Time   COLORURINE YELLOW 06/26/2015 0005   APPEARANCEUR CLEAR 06/26/2015 0005   LABSPEC 1.002 (L) 06/26/2015 0005   PHURINE 7.0 06/26/2015 0005   GLUCOSEU NEGATIVE 06/26/2015 0005   HGBUR NEGATIVE 06/26/2015 0005   BILIRUBINUR NEGATIVE 06/26/2015 0005   BILIRUBINUR negative 01/18/2015 1446   KETONESUR NEGATIVE 06/26/2015 0005   PROTEINUR NEGATIVE 06/26/2015 0005   UROBILINOGEN 0.2 01/24/2015 1736   NITRITE NEGATIVE 06/26/2015 0005   LEUKOCYTESUR SMALL (A) 06/26/2015 0005     Ripudeep Rai M.D. [facility] 09/29/2016, 3:37 PM  Pager: [phone] Between 7am to 7pm - call Pager - [phone]  After 7pm go to [email or web address] - password [email or web address]  Call night coverage person covering after 7pm

## 2016-09-29 NOTE — Evaluation (Signed)
Occupational Therapy Evaluation and Discharge Patient Details Name: [name] MRN: [id number] DOB: 10/19/1950 Today's Date: 09/29/2016    [facility] of Present Illness [name] is an 66 y.o. female with a PMH of CVA (2010) and  HTN, [facility] with right sided weakness and sensory changes following a fall at home.   Clinical Impression   PTA Pt independent in ADL and mobility. Pt is currently modified independent for ADL and mobility in room. Pt reports that her right hand still has some sensory deficits in the right palm, but strength, dexterity, and function is [facility] Inc[facility]FL. Pt without opportunity to ask questions at end of session, as MD entered and after Pt was seated, began discussion with Pt. No further OT concerns at this time, OT to sign off. Thank you for this referral.     Follow Up Recommendations  No OT follow up    Equipment Recommendations  None recommended by OT    Recommendations for Other Services       Precautions / Restrictions Restrictions Weight Bearing Restrictions: No      Mobility Bed Mobility Overal bed mobility: Modified Independent             General bed mobility comments: increased time and pain due to sciatica, use of bed rails to assist  Transfers Overall transfer level: Modified independent Equipment used: None             General transfer comment: Pt able to perform transfers from bed and toilet without assist    Balance Overall balance assessment: Needs assistance Sitting-balance support: No upper extremity supported;Feet supported Sitting balance-Leahy Scale: Normal Sitting balance - Comments: sitting EOB for LB dressing   Standing balance support: No upper extremity supported;During functional activity Standing balance-Leahy Scale: Good Standing balance comment: for sink level grooming                           ADL either performed or assessed with clinical judgement    ADL Overall ADL's : Modified independent                                       General ADL Comments: Able to ambulate to bathroom, perform peri care with right hand, no LOB, sink level grooming, self-feeding with no assist needed     Vision Patient Visual Report: No change from baseline Vision Assessment?: Yes Eye Alignment: Within Functional Limits Ocular Range of Motion: Within Functional Limits Alignment/Gaze Preference: Within Defined Limits Tracking/Visual Pursuits: Able to track stimulus in all quads without difficulty Saccades: Within functional limits Convergence: Within functional limits Visual Fields: No apparent deficits     Perception     Praxis      Pertinent Vitals/Pain Pain Assessment: Faces Faces Pain Scale: Hurts little more Pain Location: Bilateral legs due to sciatica Pain Descriptors / Indicators: Discomfort;Sharp Pain Intervention(s): Monitored during session;Repositioned     Hand Dominance Right   Extremity/Trunk Assessment Upper Extremity Assessment Upper Extremity Assessment: Overall WFL for tasks assessed;RUE deficits/detail RUE Deficits / Details: strength and ROM WFL, able to perform fine motor deterity exercises, reports decreased sensation in the palm of her hand "But its feeling more normal" RUE Sensation: decreased light touch   Lower Extremity Assessment Lower Extremity Assessment: Defer to PT evaluation   Cervical / Trunk Assessment Cervical / Trunk Assessment: Normal  Communication Communication Communication: No difficulties   Cognition Arousal/Alertness: Awake/alert Behavior During Therapy: WFL for tasks assessed/performed Overall Cognitive Status: Within Functional Limits for tasks assessed                                     General Comments       Exercises     Shoulder Instructions      Home Living Family/patient expects to be discharged to:: Private residence Living Arrangements:  Alone Available Help at Discharge: Family;Friend(s);Available PRN/intermittently   Home Access: Stairs to enter Entrance Stairs-Number of Steps: 3 Entrance Stairs-Rails: None Home Layout: Multi-level Alternate Level Stairs-Number of Steps: flight   Bathroom Shower/Tub: IT trainer: Standard         Additional Comments: Home Living information incomplete at this time - need to complete home equipment information      Prior Functioning/Environment Level of Independence: Independent        Comments: works, drives.        OT Problem List: Impaired sensation      OT Treatment/Interventions:      OT Goals(Current goals can be found in the care plan section) Acute Rehab OT Goals Patient Stated Goal: none stated  OT Frequency:     Barriers to D/C:            Co-evaluation              AM-PAC PT "6 Clicks" Daily Activity     Outcome Measure Help from another person eating meals?: None Help from another person taking care of personal grooming?: None Help from another person toileting, which includes using toliet, bedpan, or urinal?: None Help from another person bathing (including washing, rinsing, drying)?: None Help from another person to put on and taking off regular upper body clothing?: None Help from another person to put on and taking off regular lower body clothing?: None 6 Click Score: 24   End of Session Nurse Communication: Mobility status  Activity Tolerance: Patient tolerated treatment well Patient left: in chair;with call bell/phone within reach;with nursing/sitter in room (MD in room)  OT Visit Diagnosis: Other symptoms and signs involving the nervous system (R29.898)                Time: 4098-1191 OT Time Calculation (min): 19 min Charges:  OT General Charges $OT Visit: 1 Procedure OT Evaluation $OT Eval Low Complexity: 1 Procedure G-Codes: OT G-codes **NOT FOR INPATIENT CLASS** Functional Assessment Tool  Used: AM-PAC 6 Clicks Daily Activity Functional Limitation: Self care Self Care Current Status (Y7829): 0 percent impaired, limited or restricted Self Care Goal Status (F6213): 0 percent impaired, limited or restricted Self Care Discharge Status (Y8657): 0 percent impaired, limited or restricted   Sherryl Manges OTR/L [phone]  Evern Bio [name] 09/29/2016, 10:08 AM

## 2016-09-29 NOTE — Care Management Obs Status (Signed)
MEDICARE OBSERVATION STATUS NOTIFICATION   Patient Details  Name: [name] MRN: [id number] Date of Birth: 09/01/1950   Medicare Observation Status Notification Given:  Yes    Leone Havenaylor, Adolphe Fortunato Clinton, RN 09/29/2016, 10:41 AM

## 2016-09-29 NOTE — Progress Notes (Signed)
STROKE TEAM PROGRESS NOTE   HISTORY OF PRESENT ILLNESS (per record) [name] is an 66 y.o. female with a PMH of CVA (2010), questionable Hashimoto's thyroiditis, and  HTN, presented to [facility] the morning of 7/17 following a fall at home. She stated that she felt very weak, "all [my] power went out", and fell sideways on a wall after getting up to use the restroom around 0300. She denies any head trauma. She noted that her right arm and leg continued to feel weak for 2-3 minutes after the event, after which she was able to get up and get her phone. She was able to move and ambulate "normally" by the time EMS arrived.  She states that she has had 2 or 3 events of right-sided weakness since arriving to [facility]. She had no symptoms on my visit.  Patient stated that she does not have residual weakness or sensory loss following her previous stroke. She does state that her right hand and foot, namely her fingertips and toes, feel like there are "pebbles" in them, and she does have some right-perioral and right tongue tingling when her blood pressure gets too high.  No acute findings on imaging to-date.    Pertinent Imaging: CT head 7/17: No acute hemorrhage. Moderate white matter disease. MRI pending  Date last known well: Date: 09/27/2016 Time last known well: Unable to determine  tPA Given: No: Symptoms resolved  Modified Rankin Score: 1  Stroke Risk Factors - hypertension   Patient was not administered IV t-PA secondary to resolution of symptoms on arrival. She was admitted to General Neurology for further evaluation and treatment.   SUBJECTIVE (INTERVAL HISTORY) She is alone in the room.  The patient is sitting up in the chair eating and watching television.  The patient follows all commands appropriately.  Her LDL is marginally high for post-stroke parameters, and after discussion of risk versus benefit, the patient prefers not to be started on statin therapy.  Patient is taking  several herbals, including nattokinase.  Plavix discontinued.  Patient agrees to continue taking aspirin for stroke prevention.  OBJECTIVE Temp:  [97.7 F (36.5 C)-98.6 F (37 C)] 97.7 F (36.5 C) (07/18 0803) Pulse Rate:  [61-76] 64 (07/18 0803) Cardiac Rhythm: Bundle branch block;Heart block (07/18 0710) Resp:  [18-20] 20 (07/18 0803) BP: (146-203)/(79-102) 196/99 (07/18 0803) SpO2:  [79 %-100 %] 100 % (07/18 0803)  CBC:  Recent Labs Lab 09/28/16 0539 09/28/16 0552 09/29/16 0419  WBC 10.9*  --  8.6  NEUTROABS 6.7  --   --   HGB 15.0 15.6* 13.9  HCT 43.8 46.0 42.0  MCV 91.3  --  92.7  PLT 193  --  181    Basic Metabolic Panel:  Recent Labs Lab 09/28/16 0539 09/28/16 0552 09/29/16 0419  NA 140 143 139  K 3.5 3.3* 3.2*  CL 107 104 107  CO2 25  --  26  GLUCOSE 105* 106* 99  BUN 16 18 13   CREATININE 1.08* 1.00 1.23*  CALCIUM 9.1  --  8.9    Lipid Panel:    Component Value Date/Time   CHOL 174 09/29/2016 0419   TRIG 96 09/29/2016 0419   HDL 67 09/29/2016 0419   CHOLHDL 2.6 09/29/2016 0419   VLDL 19 09/29/2016 0419   LDLCALC 88 09/29/2016 0419   HgbA1c:  Lab Results  Component Value Date   HGBA1C 5.7 (H) 05/23/2013   Urine Drug Screen:    Component Value Date/Time   LABOPIA NONE  DETECTED 09/28/2016 1614   COCAINSCRNUR NONE DETECTED 09/28/2016 1614   LABBENZ NONE DETECTED 09/28/2016 1614   AMPHETMU NONE DETECTED 09/28/2016 1614   THCU NONE DETECTED 09/28/2016 1614   LABBARB NONE DETECTED 09/28/2016 1614    Alcohol Level No results found for: ETH  IMAGING I have personally reviewed the radiological images below and agree with the radiology interpretations.  Dg Chest 2 View 09/28/2016 IMPRESSION: No acute cardiopulmonary disease.  Ct Head Wo Contrast  09/28/2016 IMPRESSION: 1. No acute intracranial hemorrhage. 2. Moderate white matter disease as described. If symptoms persist, and there are no contraindications, MRI may provide better evaluation if  clinically indicated.   Mr [name] Head Wo Contrast Mr Brain Wo Contrast 09/28/2016 IMPRESSION: Negative for acute infarct. Moderate to severe chronic microvascular ischemic change Moderate intracranial atherosclerotic disease without large vessel occlusion.  Carotid Doppler 09/29/2016 No significant (1-39%) ICA stenosis. Antegrade vertebral flow.    2D Echo 09/29/2016 pending   PHYSICAL EXAM  Temp:  [97.7 F (36.5 C)-99 F (37.2 C)] 99 F (37.2 C) (07/18 1740) Pulse Rate:  [61-78] 78 (07/18 1740) Resp:  [18-20] 18 (07/18 1740) BP: (146-200)/(79-102) 159/90 (07/18 1740) SpO2:  [79 %-100 %] 100 % (07/18 1740)  General - Well nourished, well developed, in no apparent distress.  Ophthalmologic - Sharp disc margins OU.   Cardiovascular - Regular rate and rhythm.  Mental Status -  Level of arousal and orientation to time, place, and person were intact. Language including expression, naming, repetition, comprehension was assessed and found intact. Fund of Knowledge was assessed and was intact.  Cranial Nerves II - XII - II - Visual field intact OU. III, IV, VI - Extraocular movements intact. V - Facial sensation intact bilaterally. VII - Facial movement intact bilaterally. VIII - Hearing & vestibular intact bilaterally. X - Palate elevates symmetrically. XI - Chin turning & shoulder shrug intact bilaterally. XII - Tongue protrusion intact.  Motor Strength - The patient's strength was normal in all extremities and pronator drift was absent.  Bulk was normal and fasciculations were absent.   Motor Tone - Muscle tone was assessed at the neck and appendages and was normal.  Reflexes - The patient's reflexes were 1+ in all extremities and she had no pathological reflexes.  Sensory - Light touch, temperature/pinprick were assessed and were symmetrical.    Coordination - The patient had normal movements in the hands and feet with no ataxia or dysmetria.  Tremor was absent.  Gait  and Station - deferred   ASSESSMENT/PLAN [name] is a 66 y.o. female with history CVA (2010), questionable Hashimoto's thyroiditis, and  HTN, presenting after sudden-onset right-sided weakness and controlled fall. She did not receive IV t-PA due to arriving outside of the treatment window.   Anxiety s/p fall   Resultant  No deficit   CT head: No acute stroke  MRI head: No acute stroke  MRA head: unremarkable  Carotid Doppler: No significant (1-39%) ICA stenosis. Antegrade vertebral flow  2D Echo: pending  LDL 84  HgbA1c 5.7  Lovenox 40 mg sq daily for VTE prophylaxis  Diet regular Room service appropriate? Yes; Fluid consistency: Thin  No antithrombotic prior to admission, now on aspirin 81 mg daily. Pt only agrees to take ASA 81mg  at home.   Patient counseled to be compliant with her antithrombotic medications  Ongoing aggressive stroke risk factor management  Therapy recommendations:  none  Disposition:  Pending  Hx of stroke  Pt reported to have stroke  in 2010   Intermittent right hand and right toe numbness tingling as residue deficit.  Hypertension  Unstable  BP goal normotensive  Treatment per primary team  Hyperlipidemia  Home meds:  none  LDL 84, goal < 70  Patient requested not to be started on statin therapy  Continue statin at discharge  Other Stroke Risk Factors  Advanced age  ETOH use, advised to drink no more than 1 drink(s) a day  Obesity, Body mass index is 33 kg/m., recommend weight loss, diet and exercise as appropriate   Other Active Problems  CKD, Stage 3, possibly associated with chronic hypertension   Hypokalemia 3.2  Hospital day # 0  Neurology will sign off. Please call with questions. No neuro follow up needed at this time. Thanks for the consult.  Krystal PlanJindong Locklyn Henriquez, MD PhD Stroke Neurology 09/29/2016 9:29 PM  To contact Stroke Continuity provider, please refer to [email or web address]. After hours, contact  General Neurology

## 2016-09-29 NOTE — Progress Notes (Signed)
*  PRELIMINARY RESULTS* Vascular Ultrasound Carotid Duplex (Doppler) has been completed.  Preliminary findings: Bilateral: No significant (1-39%) ICA stenosis. Antegrade vertebral flow.    Farrel DemarkJill Eunice, RDMS, RVT  09/29/2016, 9:23 AM

## 2016-09-29 NOTE — Progress Notes (Signed)
  Echocardiogram 2D Echocardiogram has been performed.  Vernee Baines T Aryia Delira 09/29/2016, 12:29 PM

## 2016-09-29 NOTE — Evaluation (Signed)
Physical Therapy Evaluation Patient Details Name: [name] MRN: [id number] DOB: January 18, 1951 Today's Date: 09/29/2016   History of Present Illness  [name] is an 66 y.o. female with a PMH of CVA (2010) and  HTN, presented to [facility] the morning of 7/17 with right sided weakness and sensory changes following a fall at home.  Clinical Impression  Patient seen for mobility assessment. At this time, patient mobilizing well, shows no overt LOB, no deficits in strength or deviations in gait. Do not feel patient requires further acute PT at this time. Will sign off.     Follow Up Recommendations No PT follow up    Equipment Recommendations  None recommended by PT    Recommendations for Other Services       Precautions / Restrictions Restrictions Weight Bearing Restrictions: No      Mobility  Bed Mobility Overal bed mobility: Modified Independent             General bed mobility comments: increased time and pain due to sciatica, use of bed rails to assist  Transfers Overall transfer level: Modified independent Equipment used: None             General transfer comment: Pt able to perform transfers from bed and toilet without assist  Ambulation/Gait Ambulation/Gait assistance: Independent Ambulation Distance (Feet): 410 Feet Assistive device: None Gait Pattern/deviations: WFL(Within Functional Limits)   Gait velocity interpretation: at or above normal speed for age/gender General Gait Details: steady with ambulation , good speed  Stairs Stairs: Yes Stairs assistance: Modified independent (Device/Increase time) Stair Management: One rail Right Number of Stairs: 12 General stair comments: no difficulty  Wheelchair Mobility    Modified Rankin (Stroke Patients Only) Modified Rankin (Stroke Patients Only) Pre-Morbid Rankin Score: No symptoms Modified Rankin: No symptoms     Balance Overall balance assessment: Needs  assistance Sitting-balance support: No upper extremity supported;Feet supported Sitting balance-Leahy Scale: Normal Sitting balance - Comments: sitting EOB for LB dressing   Standing balance support: No upper extremity supported;During functional activity Standing balance-Leahy Scale: Good Standing balance comment: for sink level grooming                             Pertinent Vitals/Pain Pain Assessment: Faces Faces Pain Scale: Hurts little more Pain Location: Lower extremities Pain Descriptors / Indicators: Discomfort;Sharp Pain Intervention(s): Monitored during session    Home Living Family/patient expects to be discharged to:: Private residence Living Arrangements: Alone Available Help at Discharge: Family;Friend(s);Available PRN/intermittently   Home Access: Stairs to enter Entrance Stairs-Rails: None Entrance Stairs-Number of Steps: 3 Home Layout: Multi-level   Additional Comments: Home Living information incomplete at this time - need to complete home equipment information    Prior Function Level of Independence: Independent         Comments: works, drives.     Hand Dominance   Dominant Hand: Right    Extremity/Trunk Assessment   Upper Extremity Assessment Upper Extremity Assessment: Overall WFL for tasks assessed RUE Deficits / Details: strength and ROM WFL, able to perform fine motor deterity exercises, reports decreased sensation in the palm of her hand "But its feeling more normal" RUE Sensation: decreased light touch    Lower Extremity Assessment Lower Extremity Assessment: Overall WFL for tasks assessed    Cervical / Trunk Assessment Cervical / Trunk Assessment: Normal  Communication   Communication: No difficulties  Cognition Arousal/Alertness: Awake/alert Behavior During Therapy: WFL for tasks assessed/performed Overall Cognitive  Status: Within Functional Limits for tasks assessed                                         General Comments      Exercises     Assessment/Plan    PT Assessment Patent does not need any further PT services  PT Problem List         PT Treatment Interventions      PT Goals (Current goals can be found in the Care Plan section)  Acute Rehab PT Goals Patient Stated Goal: none stated PT Goal Formulation: All assessment and education complete, DC therapy    Frequency     Barriers to discharge        Co-evaluation               AM-PAC PT "6 Clicks" Daily Activity  Outcome Measure Difficulty turning over in bed (including adjusting bedclothes, sheets and blankets)?: None Difficulty moving from lying on back to sitting on the side of the bed? : None Difficulty sitting down on and standing up from a chair with arms (e.g., wheelchair, bedside commode, etc,.)?: None Help needed moving to and from a bed to chair (including a wheelchair)?: None Help needed walking in hospital room?: None Help needed climbing 3-5 steps with a railing? : None 6 Click Score: 24    End of Session   Activity Tolerance: Patient tolerated treatment well Patient left: in chair;with call bell/phone within reach Nurse Communication: Mobility status PT Visit Diagnosis: Other symptoms and signs involving the nervous system (R29.898)    Time: [id number] PT Time Calculation (min) (ACUTE ONLY): 16 min   Charges:   PT Evaluation $PT Eval Low Complexity: 1 Procedure     PT G Codes:   PT G-Codes **NOT FOR INPATIENT CLASS** Functional Assessment Tool Used: Clinical judgement Functional Limitation: Mobility: Walking and moving around Mobility: Walking and Moving Around Current Status ([id number]([id number]): At least 1 percent but less than 20 percent impaired, limited or restricted Mobility: Walking and Moving Around Goal Status [phone](G8979): At least 1 percent but less than 20 percent impaired, limited or restricted Mobility: Walking and Moving Around Discharge Status [phone](G8980): At least 1 percent but less  than 20 percent impaired, limited or restricted    Charlotte Crumbevon Myrella Fahs, PT DPT N[facility] [phone]   Fabio AsaDevon J Emmauel Hallums 09/29/2016, 11:43 AM

## 2016-09-30 ENCOUNTER — Other Ambulatory Visit: Payer: Self-pay | Admitting: Cardiology

## 2016-09-30 DIAGNOSIS — R531 Weakness: Secondary | ICD-10-CM | POA: Diagnosis not present

## 2016-09-30 DIAGNOSIS — G459 Transient cerebral ischemic attack, unspecified: Secondary | ICD-10-CM | POA: Diagnosis not present

## 2016-09-30 LAB — VAS US CAROTID
LCCADDIAS: -17 cm/s
LEFT ECA DIAS: -13 cm/s
LEFT VERTEBRAL DIAS: 14 cm/s
LICADDIAS: -25 cm/s
LICADSYS: -67 cm/s
LICAPSYS: -47 cm/s
Left CCA dist sys: -55 cm/s
Left CCA prox dias: 21 cm/s
Left CCA prox sys: 67 cm/s
Left ICA prox dias: -19 cm/s
RIGHT ECA DIAS: -11 cm/s
RIGHT VERTEBRAL DIAS: 18 cm/s
Right CCA prox dias: 19 cm/s
Right CCA prox sys: 63 cm/s
Right cca dist sys: -48 cm/s

## 2016-09-30 LAB — HEMOGLOBIN A1C
Hgb A1c MFr Bld: 5.3 % (ref 4.8–5.6)
Mean Plasma Glucose: 105 mg/dL

## 2016-09-30 MED ORDER — ASPIRIN 81 MG PO TBEC: 81.0000 mg | DELAYED_RELEASE_TABLET | Freq: Every day | ORAL | 3 refills | Status: DC

## 2016-09-30 MED ORDER — HYDRALAZINE HCL 25 MG PO TABS: 25.0000 mg | ORAL_TABLET | Freq: Three times a day (TID) | ORAL | 3 refills | Status: DC

## 2016-09-30 MED ORDER — NEBIVOLOL HCL 10 MG PO TABS: 10.0000 mg | ORAL_TABLET | Freq: Every day | ORAL | 3 refills | Status: DC

## 2016-09-30 MED ORDER — HYDRALAZINE HCL 20 MG/ML IJ SOLN: 10.0000 mg | INTRAMUSCULAR | Status: DC | PRN

## 2016-09-30 NOTE — Progress Notes (Signed)
Patient is discharged from room 5C20 at this time. Alert and in stable condition. IV site d/c'd as well as tele. Instructions read to patient and family with understanding verbalized. Left unit via wheelchair with all belongings at side.

## 2016-09-30 NOTE — Care Management Note (Signed)
Case Management Note  Patient Details  Name: [name] MRN: [id number] Date of Birth: 05/09/1950  Subjective/Objective:    From home alone , presents with TIA and HTN urgency, for dc today. No needs.                 Action/Plan:   Expected Discharge Date:  09/30/16               Expected Discharge Plan:  Home/Self Care  In-House Referral:     Discharge planning Services  CM Consult  Post Acute Care Choice:    Choice offered to:     DME Arranged:    DME Agency:     HH Arranged:    HH Agency:     Status of Service:  Completed, signed off  If discussed at [employer] Length of Stay Meetings, dates discussed:    Additional Comments:  Leone Havenaylor, Denene Alamillo Clinton, RN 09/30/2016, 2:00 PM

## 2016-09-30 NOTE — Discharge Summary (Signed)
Physician Discharge Summary   Patient ID: Cianni Manny MRN: [id number] DOB/AGE: February 18, 1951 66 y.o.  Admit date: 09/28/2016 Discharge date: 09/30/2016  Primary Care Physician:  Madelin Headings, MD  Discharge Diagnoses:    . TIA (transient ischemic attack)Versus hypertensive urgency . Essential hypertension . Left bundle branch block   Consults:  Neurology  Recommendations for Outpatient Follow-up:  1. Please repeat CBC/BMET at next visit 2. Amlodipine discontinued per patient's request 3. However given BP out of control, patient started on hydralazine 25 mg every 8 hours with instructions given to the patient to check BP twice a day. She may drop the midday dose of hydralazine if the BP starts to run low. She will continue bystolic.   DIET: Heart healthy diet    Allergies:   Allergies  Allergen Reactions  . Isovue [Iopamidol] Itching    Pt states she had itching after CT in 2015.  Marland Kitchen Penicillins Swelling      . Sulfa Antibiotics Itching     DISCHARGE MEDICATIONS: Current Discharge Medication List    START taking these medications   Details  aspirin EC 81 MG EC tablet Take 1 tablet (81 mg total) by mouth daily. Qty: 30 tablet, Refills: 3    hydrALAZINE (APRESOLINE) 25 MG tablet Take 1 tablet (25 mg total) by mouth 3 (three) times daily. Qty: 90 tablet, Refills: 3      CONTINUE these medications which have CHANGED   Details  nebivolol (BYSTOLIC) 10 MG tablet Take 1 tablet (10 mg total) by mouth daily. Qty: 90 tablet, Refills: 3      CONTINUE these medications which have NOT CHANGED   Details  Ascorbic Acid (VITAMIN C) 1000 MG tablet Take 1,000 mg by mouth every morning.     ASHWAGANDHA PO Take by mouth.    B Complex-C (B-COMPLEX WITH VITAMIN C) tablet Take 1 tablet by mouth daily.      Cholecalciferol (VITAMIN D PO) Take 1,000 Units by mouth daily.    cloNIDine (CATAPRES) 0.1 MG tablet Take 0.1 mg by mouth as needed (for high blood pressure).     LevOCARNitine (L-CARNITINE PO) Take 800 mg by mouth every morning.     MULTIPLE VITAMIN PO Take 1 tablet by mouth daily.     NATTOKINASE PO Take 1 tablet by mouth every morning. Helps blood flow    !! NON FORMULARY Barley Green:  Takes 1 tsp daily    !! NON FORMULARY Chlorella:  Takes 15 capsules daily    POTASSIUM PO Take 1 tablet by mouth daily.    Probiotic Product (PROBIOTIC PO) Take 1 tablet by mouth daily. Bifido Beadlet 50+    SPIRULINA PO Take 1 teaspoon by mouth daily     !! - Potential duplicate medications found. Please discuss with provider.    STOP taking these medications     amLODipine (NORVASC) 5 MG tablet      amLODipine (NORVASC) 5 MG tablet          Brief H and P: For complete details please refer to admission H and P, but in briefThe patient is a 66 year old with history of CVA in 2010 with residual right-sided numbness, thyroid dysfunction, hypertension, diverticulosis, depression. Patient reported several weeks medic complains over the last several days. However a day before the admission, she was ambulating to the bathroom when she had sudden onset of right-sided weakness and numbness. She stumbled on her way to the bathroom running into the wall. Due to the prior history of  CVA patient presented to ED for further workup.  Hospital Course:   Right-sided weakness, numbness with multiple somatic complaints possible TIA versus hypertensive urgency with prior history of CVA - MRI negative for acute CVA - MRA with moderate to severe chronic microvascular ischemic change - Carotid Dopplers with 1-39% ICA stenosis - 2-D echo showed normal systolic function, grade 1 diastolic dysfunction - PT with no PT follow-up needed, at baseline - Continue aspirin 81 mg daily - Neurology was consulted and did not think patient had any TIA       Essential hypertension/hypertensive urgency - Continue bystolic, patient states that she does not want to be on amlodipine  anymore due to side effects - Placed her on hydralazine 25mg  q8hrs.  - she tolerated hydralazine well. Patient was given instruction to drop midday dose of hydralazine if her BP starts to run low. She was also given the instruction to take clonidine only as needed if BP > 200/100 with symptoms. She was strongly advised to follow up with her PCP within X7 to 10 days and adjust her antihypertensives as needed.  Hypokalemia - Replaced Day of Discharge BP (!) 152/95 (BP Location: Left Arm)   Pulse 66   Temp 98.6 F (37 C) (Oral)   Resp 17   Ht 5\' 3"  (1.6 m)   Wt 84.5 kg (186 lb 4.8 oz)   SpO2 98%   BMI 33.00 kg/m   Physical Exam: General: Alert and awake oriented x3 not in any acute distress. HEENT: anicteric sclera, pupils reactive to light and accommodation CVS: S1-S2 clear no murmur rubs or gallops Chest: clear to auscultation bilaterally, no wheezing rales or rhonchi Abdomen: soft nontender, nondistended, normal bowel sounds Extremities: no cyanosis, clubbing or edema noted bilaterally Neuro: Cranial nerves II-XII intact, no focal neurological deficits   The results of significant diagnostics from this hospitalization (including imaging, microbiology, ancillary and laboratory) are listed below for reference.    LAB RESULTS: Basic Metabolic Panel:  Recent Labs Lab 09/28/16 0539 09/28/16 0552 09/29/16 0419  NA 140 143 139  K 3.5 3.3* 3.2*  CL 107 104 107  CO2 25  --  26  GLUCOSE 105* 106* 99  BUN 16 18 13   CREATININE 1.08* 1.00 1.23*  CALCIUM 9.1  --  8.9   Liver Function Tests:  Recent Labs Lab 09/28/16 0539 09/29/16 0419  AST 40 24  ALT 17 16  ALKPHOS 74 48  BILITOT 1.5* 2.0*  PROT 7.4 6.5  ALBUMIN 4.1 3.6   No results for input(s): LIPASE, AMYLASE in the last 168 hours. No results for input(s): AMMONIA in the last 168 hours. CBC:  Recent Labs Lab 09/28/16 0539 09/28/16 0552 09/29/16 0419  WBC 10.9*  --  8.6  NEUTROABS 6.7  --   --   HGB 15.0  15.6* 13.9  HCT 43.8 46.0 42.0  MCV 91.3  --  92.7  PLT 193  --  181   Cardiac Enzymes:  Recent Labs Lab 09/28/16 1205  TROPONINI <0.03   BNP: Invalid input(s): POCBNP CBG: No results for input(s): GLUCAP in the last 168 hours.  Significant Diagnostic Studies:  Dg Chest 2 View  Result Date: 09/28/2016 CLINICAL DATA:  Cough. EXAM: CHEST  2 VIEW COMPARISON:  12/29/2014. FINDINGS: Mediastinum and hilar structures normal. Heart size stable. No focal infiltrate. No pleural effusion or pneumothorax. IMPRESSION: No acute cardiopulmonary disease. Electronically Signed   By: Maisie Fus  Register   On: 09/28/2016 07:00   Ct Head Wo  Contrast  Result Date: 09/28/2016 CLINICAL DATA:  66 year old female with short episode of right sided weakness. EXAM: CT HEAD WITHOUT CONTRAST TECHNIQUE: Contiguous axial images were obtained from the base of the skull through the vertex without intravenous contrast. COMPARISON:  Head CT dated 05/23/2013 an MRI dated 05/23/2013 FINDINGS: Brain: There is mild age-related atrophy. Moderate periventricular and deep white matter chronic disease may represent chronic microvascular ischemic changes or demyelinating disease or vasculitis. There is no acute intracranial hemorrhage. No mass effect or midline shift noted. No extra-axial fluid collection. Vascular: No hyperdense vessel or unexpected calcification. Skull: Normal. Negative for fracture or focal lesion. Sinuses/Orbits: No acute finding. Other: None IMPRESSION: 1. No acute intracranial hemorrhage. 2. Moderate white matter disease as described. If symptoms persist, and there are no contraindications, MRI may provide better evaluation if clinically indicated. Electronically Signed   By: Elgie Collard M.D.   On: 09/28/2016 06:51   Mr Maxine Glenn Head Wo Contrast  Result Date: 09/28/2016 CLINICAL DATA:  Right-sided weakness EXAM: MRI HEAD WITHOUT CONTRAST MRA HEAD WITHOUT CONTRAST TECHNIQUE: Multiplanar, multiecho pulse sequences  of the brain and surrounding structures were obtained without intravenous contrast. Angiographic images of the head were obtained using MRA technique without contrast. COMPARISON:  CT 09/28/2016 FINDINGS: MRI HEAD FINDINGS Brain: Negative for acute infarct. Moderately severe changes throughout the cerebral white matter with multiple patchy areas of hyperintensity in the white matter. Hyperintensity left lateral thalamus. Mild disease in the pons. Findings most consistent with chronic microvascular ischemia. Negative for hemorrhage or mass lesion. No shift of the midline structures. Ventricle size normal. Vascular: Normal arterial flow voids Skull and upper cervical spine: Negative Sinuses/Orbits: Negative Other: None MRA HEAD FINDINGS Both vertebral arteries patent to the basilar without stenosis. PICA patent bilaterally. Basilar widely patent. Irregularity in the posterior cerebral artery bilaterally compatible with moderate atherosclerotic disease. No large vessel occlusion Mild irregularity of the internal carotid artery bilaterally without stenosis. Mild irregularity of the anterior cerebral artery bilaterally. Severe stenosis in the anterior branch of the right middle cerebral artery. Multiple areas of stenosis in the M3 segment of the posterior branch of the right middle cerebral artery. M1 segment patent bilaterally. Mild disease in left MCA branches. Negative for aneurysm IMPRESSION: Negative for acute infarct. Moderate to severe chronic microvascular ischemic change Moderate intracranial atherosclerotic disease without large vessel occlusion Electronically Signed   By: Marlan Palau M.D.   On: 09/28/2016 12:22   Mr Brain Wo Contrast  Result Date: 09/28/2016 CLINICAL DATA:  Right-sided weakness EXAM: MRI HEAD WITHOUT CONTRAST MRA HEAD WITHOUT CONTRAST TECHNIQUE: Multiplanar, multiecho pulse sequences of the brain and surrounding structures were obtained without intravenous contrast. Angiographic images  of the head were obtained using MRA technique without contrast. COMPARISON:  CT 09/28/2016 FINDINGS: MRI HEAD FINDINGS Brain: Negative for acute infarct. Moderately severe changes throughout the cerebral white matter with multiple patchy areas of hyperintensity in the white matter. Hyperintensity left lateral thalamus. Mild disease in the pons. Findings most consistent with chronic microvascular ischemia. Negative for hemorrhage or mass lesion. No shift of the midline structures. Ventricle size normal. Vascular: Normal arterial flow voids Skull and upper cervical spine: Negative Sinuses/Orbits: Negative Other: None MRA HEAD FINDINGS Both vertebral arteries patent to the basilar without stenosis. PICA patent bilaterally. Basilar widely patent. Irregularity in the posterior cerebral artery bilaterally compatible with moderate atherosclerotic disease. No large vessel occlusion Mild irregularity of the internal carotid artery bilaterally without stenosis. Mild irregularity of the anterior cerebral artery bilaterally. Severe  stenosis in the anterior branch of the right middle cerebral artery. Multiple areas of stenosis in the M3 segment of the posterior branch of the right middle cerebral artery. M1 segment patent bilaterally. Mild disease in left MCA branches. Negative for aneurysm IMPRESSION: Negative for acute infarct. Moderate to severe chronic microvascular ischemic change Moderate intracranial atherosclerotic disease without large vessel occlusion Electronically Signed   By: Marlan Palauharles  Clark M.D.   On: 09/28/2016 12:22    2D ECHO: Study Conclusions  - Left ventricle: The cavity size was normal. Wall thickness was   increased in a pattern of mild LVH. Systolic function was normal.   Wall motion was normal; there were no regional wall motion   abnormalities. Doppler parameters are consistent with abnormal   left ventricular relaxation (grade 1 diastolic dysfunction).  Disposition and Follow-up: Discharge  Instructions    Diet - low sodium heart healthy    Complete by:  As directed    Discharge instructions    Complete by:  As directed    Please STOP amlodipine.  You are now on hydralazine 25mg  three times a day. Please check your BP twice a day, keep a log. If your BP starts to run lower in low 100's, you can stop lunch time dose of hydralazine.  Take clonidine as needed only if SBP above 190 and symptomatic with headache, blurry vision etc. Please follow-up with your PCP, Dr Fabian SharpPanosh in 7-10days   Increase activity slowly    Complete by:  As directed        DISPOSITION: Home   DISCHARGE FOLLOW-UP Follow-up Information    Panosh, Neta MendsWanda K, MD. Schedule an appointment as soon as possible for a visit in 10 day(s).   Specialties:  Internal Medicine, Pediatrics Contact information: [address] [place] [id number] [phone]            Time spent on Discharge: 25mins   Signed:   Thad Rangeripudeep Chonte Ricke M.D. [facility] 09/30/2016, 1:51 PM Pager: [phone]

## 2016-10-04 ENCOUNTER — Telehealth: Payer: Self-pay

## 2016-10-04 NOTE — Telephone Encounter (Signed)
LMTCB

## 2016-10-05 NOTE — Telephone Encounter (Signed)
LMTCB

## 2016-10-06 NOTE — Telephone Encounter (Signed)
LMTCB

## 2016-10-11 ENCOUNTER — Encounter: Payer: Self-pay | Admitting: Internal Medicine

## 2016-10-11 ENCOUNTER — Ambulatory Visit (INDEPENDENT_AMBULATORY_CARE_PROVIDER_SITE_OTHER): Payer: Medicare Other | Admitting: Internal Medicine

## 2016-10-11 VITALS — BP 150/100 | HR 75 | Temp 97.9°F | Wt 188.2 lb

## 2016-10-11 DIAGNOSIS — Z79899 Other long term (current) drug therapy: Secondary | ICD-10-CM

## 2016-10-11 DIAGNOSIS — R0683 Snoring: Secondary | ICD-10-CM | POA: Diagnosis not present

## 2016-10-11 DIAGNOSIS — Z09 Encounter for follow-up examination after completed treatment for conditions other than malignant neoplasm: Secondary | ICD-10-CM | POA: Diagnosis not present

## 2016-10-11 DIAGNOSIS — I10 Essential (primary) hypertension: Secondary | ICD-10-CM | POA: Diagnosis not present

## 2016-10-11 DIAGNOSIS — I119 Hypertensive heart disease without heart failure: Secondary | ICD-10-CM | POA: Diagnosis not present

## 2016-10-11 DIAGNOSIS — Z5189 Encounter for other specified aftercare: Secondary | ICD-10-CM

## 2016-10-11 MED ORDER — CLONIDINE HCL 0.1 MG PO TABS: 0.1000 mg | ORAL_TABLET | Freq: Two times a day (BID) | ORAL | 3 refills | Status: DC

## 2016-10-11 NOTE — Patient Instructions (Addendum)
  I agree  We need to get Blood pressure   Better controlled .  To prevent  Stroke from coming on .   Get your     Sleep evaluation  Done.   Check your supplements to make sure not  Licorice based  Chemicals    in there .    That can cause low potassium and  High BP readings   licorice (the root of glycyrrhiza glabra) or licorice-like compounds (such as carbenoxolone)  Need fu visit with dr Eliott Nineunham  And I would like you to be seen in the hypertension clinic.   For now plan  To take clonidine   0.1  Twice a day  In addition to your other meds  To see if we get better BP control  Continue potassium supplementation.    Plan follow up   iin 1 month or earlier  Depending on   Above .

## 2016-10-11 NOTE — Telephone Encounter (Signed)
Pt did not ever return calls to office. Pt seen today by Dr Fabian SharpPanosh. Nothing further needed at this time.

## 2016-10-11 NOTE — Progress Notes (Signed)
Chief Complaint  Patient presents with  . Follow-up    HPI: Verlon SettingLina Gordon 66 y.o. come in for   Fu ed hosp  For tia sx with neg acute findings on  mra mri head     And Chronic disease management  She has had  Resistant HT   And last visit with me was  September  2017.She is seeing nephrologist Dr. Eliott Nineunham in the past but her follow-up appointment got delayed because of her personal obligations. So hasn't really seen anyone knew about her blood pressure. She states that her last check showed that her kidney function was "okay". This episode occurred when she was getting up to go use the bathroom before bed and she suddenly felt like she was pushed to one side where she actually hit the wall and fell down and no associated dizziness or diplopia .   Felt like glove with no bones in right hand  hand.    Called aunt  .  Who told her tocall  [phone] .  She was given clonidine 0.1 mg to take "as needed" when her systolic is 190 range. She's had to take it about 3 times since she got out of the hospital evaluation. She is only taking bystolic and hydralazine 3 times a day which does make her feel warmer tingly after she takes it. She is not taking amlodipine because she felt it caused a side effect.  Dr Eliott Nineunham.      Should be seeing .   Got delayed  .    Getting bp readings .   180/111 .   Then down to 170/100  Generally better in afternoon.   Hydralazine  No energy  hasnt done the sleep eval  But feels  And agrees should  Proceed with this.   takea bout 10 supplements   And extra otc potassium but not on diuretic  ROS: See pertinent positives and negatives per HPI. Recent  stresses  But some better   Past Medical History:  Diagnosis Date  . Arthritis   . Chicken pox   . Depression   . Diverticulitis   . Hypertension   . Sciatica   . Seasonal allergies   . Stroke (HCC) 09/2008  . Thyroid disease     Family History  Problem Relation Age of Onset  . Heart failure Mother   . Heart  disease Mother   . Diabetes Mother   . Hypertension Mother   . Hypertension Father   . Diabetes Maternal Grandmother   . Hypertension Sister   . Hypertension Brother   . Cancer Brother        Prostate  . Hypertension Sister     Social History   Social History  . Marital status: Widowed    Spouse name: N/A  . Number of children: N/A  . Years of education: N/A   Social History Main Topics  . Smoking status: Never Smoker  . Smokeless tobacco: Never Used  . Alcohol use Yes     Comment: Social Use only  . Drug use: No  . Sexual activity: Not Currently   Other Topics Concern  . 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 Ascorbic Acid (VITAMIN C) 1000 MG tablet Take 1,000 mg by mouth every morning.     . ASHWAGANDHA PO Take by mouth.    Marland Kitchen aspirin EC 81 MG EC tablet Take 1 tablet (81 mg total) by mouth daily. 30 tablet 3  . B Complex-C (B-COMPLEX WITH VITAMIN C) tablet Take 1 tablet by mouth daily.      . Cholecalciferol (VITAMIN D PO) Take 1,000 Units by mouth daily.    . hydrALAZINE (APRESOLINE) 25 MG tablet Take 1 tablet (25 mg total) by mouth 3 (three) times daily. 90 tablet 3  . LevOCARNitine (L-CARNITINE PO) Take 800 mg by mouth every morning.     . MULTIPLE VITAMIN PO Take 1 tablet by mouth daily.     Marland Kitchen NATTOKINASE PO Take 1 tablet by mouth every morning. Helps blood flow    . nebivolol (BYSTOLIC) 10 MG tablet Take 1 tablet (10 mg total) by mouth daily. 90 tablet 3  . NON FORMULARY Barley Green:  Takes 1 tsp daily    . NON FORMULARY Chlorella:  Takes 15 capsules daily    . POTASSIUM PO Take  1 tablet by mouth daily.    . Probiotic Product (PROBIOTIC PO) Take 1 tablet by mouth daily. Bifido Beadlet 50+    . SPIRULINA PO Take 1 teaspoon by mouth daily    . cloNIDine (CATAPRES) 0.1 MG tablet Take 0.1 mg by mouth as needed (for high blood pressure).     No facility-administered medications prior to visit.      EXAM:  BP (!) 150/100 (BP Location: Left Arm, Patient Position: Sitting, Cuff Size: Large)   Pulse 75   Temp 97.9 F (36.6 C) (Oral)   Wt 188 lb 3.2 oz (85.4 kg)   BMI 33.34 kg/m   Body mass index is 33.34 kg/m. Repeat bp 170/98-100 right large  GENERAL: vitals reviewed and listed above, alert, oriented, appears well hydrated and in no acute distress HEENT: atraumatic, conjunctiva  clear, no obvious abnormalities on inspection of external nose and ears  NECK: no obvious masses on inspection palpation  LUNGS: clear to auscultation bilaterally, no wheezes, rales or rhonchi, good air movement CV: HRRR, no clubbing cyanosis or  peripheral edema nl cap refill  MS: moves all extremities without noticeable focal  Abnormality grip 5/5  And gait nl  PSYCH: pleasant and cooperative, no obvious depression or anxiety Lab Results  Component Value Date   WBC 8.6 09/29/2016   HGB 13.9 09/29/2016   HCT 42.0 09/29/2016   PLT 181 09/29/2016   GLUCOSE 99 09/29/2016   CHOL 174 09/29/2016   TRIG 96 09/29/2016   HDL 67 09/29/2016   LDLCALC 88 09/29/2016   ALT 16 09/29/2016   AST 24 09/29/2016   NA 139 09/29/2016   K 3.2 (L) 09/29/2016   CL 107 09/29/2016   CREATININE 1.23 (H) 09/29/2016   BUN 13 09/29/2016   CO2 26 09/29/2016   TSH 3.655 09/28/2016   INR 0.99 09/28/2016   HGBA1C 5.3 09/29/2016   BP Readings from Last 3 Encounters:  10/11/16 (!) 150/100  09/30/16 (!) 152/95  12/11/15 (!) 180/98  Echo  Study Conclusions  - Left ventricle: The cavity size was normal. Wall thickness was   increased in a pattern of mild LVH.  Systolic function was normal.   Wall  motion was normal; there were no regional wall motion   abnormalities. Doppler parameters are consistent with abnormal   left ventricular relaxation (grade 1 diastolic dysfunction). ASSESSMENT AND PLAN:  Discussed the following assessment and plan:  Resistant hypertension ? - se of a number of meds only on 2 meds today acts like hyperaldo  no fmd renal stenosis or adrenal masses.   Alternative medicine  Snoring  Hypertensive heart disease without heart failure  Medication management  Hospital discharge follow-up We discussed supplements risks benefits and unknowns. She will look at her bottles to make sure there is no licorice type medication in it which can present with a pseudo-hyper aldo syndrome. She will proceed with sleep evaluation We discussed either adding aldosterone antagonist such as spironolactone or at this time take clonidine on a twice a day basis to maintain a better level of hypertension control. She should get her follow-up with Dr. Eliott Nineunham and I will also send her to the hypertension clinic may be able to sort out these issues more carefully Fortunately she does not have residual stroke symptoms but her symptom complex was consistent with a TIA.  Plan rov in 1 month to help coordinate care teams  -Patient advised to return or notify health care team  if  new concerns arise.  Patient Instructions   I agree  We need to get Blood pressure   Better controlled .  To prevent  Stroke from coming on .   Get your     Sleep evaluation  Done.   Check your supplements to make sure not  Licorice based  Chemicals    in there .    That can cause low potassium and  High BP readings   licorice (the root of glycyrrhiza glabra) or licorice-like compounds (such as carbenoxolone)  Need fu visit with dr Eliott Nineunham  And I would like you to be seen in the hypertension clinic.   For now plan  To take clonidine   0.1  Twice a day  In addition to your other meds  To see if we get better  BP control  Continue potassium supplementation.    Plan follow up   iin 1 month or earlier  Depending on   Above .       Neta MendsWanda K. Panosh M.D.

## 2016-10-21 ENCOUNTER — Telehealth: Payer: Self-pay | Admitting: Cardiology

## 2016-10-21 NOTE — Telephone Encounter (Signed)
Phone just keeps ringing no VM

## 2016-10-21 NOTE — Telephone Encounter (Signed)
Mrs. [name] is calling because her blood pressure is running high 189/11 and 160/99 today . Please call

## 2016-10-22 ENCOUNTER — Ambulatory Visit: Payer: Medicare Other | Admitting: Cardiovascular Disease

## 2016-10-25 NOTE — Telephone Encounter (Signed)
Spoke with pt, her bp has still been running high,183/111 to 150/98. She does report not taking the clonidine because her bp would be too low. She will make an effort to take her medications more regular. She will follow up with the hypertension clinic tomorrow.

## 2016-10-26 ENCOUNTER — Ambulatory Visit (INDEPENDENT_AMBULATORY_CARE_PROVIDER_SITE_OTHER): Payer: Medicare Other | Admitting: Pharmacist Clinician (PhC)/ Clinical Pharmacy Specialist

## 2016-10-26 ENCOUNTER — Encounter: Payer: Self-pay | Admitting: Pharmacist Clinician (PhC)/ Clinical Pharmacy Specialist

## 2016-10-26 DIAGNOSIS — I10 Essential (primary) hypertension: Secondary | ICD-10-CM | POA: Diagnosis not present

## 2016-10-26 NOTE — Assessment & Plan Note (Signed)
Patient with elevated BP in the office again today.  We had a long discussion about essential hypertension verus identifiable causes.  She was apparently set to do a sleep study, so I encouraged her to go ahead and get that done.  She is quite hesitant to take any medications and would rather not even take what she has.   For now she is agreeable to increase the hydralazine to 37.5 mg three times daily.  She should take it each morning and bedtime, then about half way in between those, rather than try to stay awake at night to keep doses at exactly 8 hour intervals.  She will continue with the Bystolic and clonidine as well.  She will need to take her BP at home twice daily and was given detailed instructions on proper technique.  She will bring this log, as well as her come cuff to her next appointment in 2 weeks.  If her pressure is still elevated and she cannot be convinced to further increase the hydralazine, I would consider trying spironolactone or a different ARB from losartan.    Also had a long discussion about how changes in diet and exercise can be beneficial to lowering BP.  She was encouraged to get back out and mow her lawn and walk daily, avoid fast/fried foods and eat a better mix of proteins and vegetables.

## 2016-10-26 NOTE — Progress Notes (Signed)
10/26/2016 [name] 07/17/1950 [id number]   HPI:  [name] is a 66 y.o. female patient of Dr Antoine PocheHochrein, with a PMH below who presents today for hypertension clinic evaluation.  Her medical history is significant for hypertension, prior stroke, thyroid disease, depression and arthritis.  She reports having issues with her blood pressure for the past couple of years and that for the past 2-3 months it has been going from high to low and back.  When asked about the low readings, these numbers are actually normal (120-130's systolic).  She notes that it is not uncommon for the diastolic to be > 366100.     Patient states sensitivities to multiple medications not listed in her allergy profile.  She states that after her stroke in 2010 she was sent home from the hospital on 5 BP medications.  She was under the assumption that she would only take them for a month or two then discontinue.   When her MD kept her on them for > 1 year, she determined that they caused her to have more problems.  She quit everything and has had problems with all the meds she has tried since then.   See intolerances below.  She also believes that there is an underlying cause for all hypertension and she would rather focus on finding that problem and solving it.  Her husband apparently died from pheochromocytoma.  Blood Pressure Goal:  130/80       Current Medications:  Hydralazine 25 mg tid  nebivolol 10 mg qd - am  Clonidine 0.1 mg bid  Family Hx:  Mother had htn, died at 8376 from DM complications  Father with htn now 7683, only since 6680  3 of 4 siblings with htn  Social Hx:  No tobacco, no alcohol (rare wine); rare caffeine  Diet:  More home cooked, no added salt; occasional fried when eating out  Exercise:  No exercise recently, for the past month worried about elevated BP so has not done her yard work or gone for walks  Home BP readings:  No home readings today, she left the list at home.   Does recall that this am was 160/98.  Intolerances:   Penicillin, sulfa  Amlodipine - caused "heart to flip", creaking bones  Losartan - creaking bones, muscles "didn't feel right"  Labs:   Na 139, K 3.2, BUN 13, SCr 1.23, CrCl 61.5  Herbals: (takes vitamin packets twice daily that include the following in unknown amounts)  ashwagandha - arthritis/inflammation, hypertension  nattokinase -  hypertension  Barley - hyperlipidemia  Chlorella - diverticulitis, GI  Spirulina - arthritis/inflammation   Wt Readings from Last 3 Encounters:  10/11/16 188 lb 3.2 oz (85.4 kg)  09/28/16 186 lb 4.8 oz (84.5 kg)  12/11/15 186 lb 4 oz (84.5 kg)   BP Readings from Last 3 Encounters:  10/26/16 (!) 162/96  10/11/16 (!) 150/100  09/30/16 (!) 152/95   Pulse Readings from Last 3 Encounters:  10/26/16 68  10/11/16 75  09/30/16 66    Current Outpatient Prescriptions  Medication Sig Dispense Refill  . Ascorbic Acid (VITAMIN C) 1000 MG tablet Take 1,000 mg by mouth every morning.     . ASHWAGANDHA PO Take by mouth.    Marland Kitchen. aspirin EC 81 MG EC tablet Take 1 tablet (81 mg total) by mouth daily. 30 tablet 3  . B Complex-C (B-COMPLEX WITH VITAMIN C) tablet Take 1 tablet by mouth daily.      .Marland Kitchen  Cholecalciferol (VITAMIN D PO) Take 1,000 Units by mouth daily.    . cloNIDine (CATAPRES) 0.1 MG tablet Take 1 tablet (0.1 mg total) by mouth 2 (two) times daily. Or as directed 60 tablet 3  . hydrALAZINE (APRESOLINE) 25 MG tablet Take 1 tablet (25 mg total) by mouth 3 (three) times daily. 90 tablet 3  . LevOCARNitine (L-CARNITINE PO) Take 800 mg by mouth every morning.     . MULTIPLE VITAMIN PO Take 1 tablet by mouth daily.     Marland Kitchen NATTOKINASE PO Take 1 tablet by mouth every morning. Helps blood flow    . nebivolol (BYSTOLIC) 10 MG tablet Take 1 tablet (10 mg total) by mouth daily. 90 tablet 3  . NON FORMULARY Barley Green:  Takes 1 tsp daily    . NON FORMULARY Chlorella:  Takes 15 capsules daily    . POTASSIUM  PO Take 1 tablet by mouth daily.    . Probiotic Product (PROBIOTIC PO) Take 1 tablet by mouth daily. Bifido Beadlet 50+    . SPIRULINA PO Take 1 teaspoon by mouth daily     No current facility-administered medications for this visit.       Past Medical History:  Diagnosis Date  . Arthritis   . Chicken pox   . Depression   . Diverticulitis   . Hypertension   . Sciatica   . Seasonal allergies   . Stroke (HCC) 09/2008  . Thyroid disease     Blood pressure (!) 162/96, pulse 68.  Essential hypertension Patient with elevated BP in the office again today.  We had a long discussion about essential hypertension verus identifiable causes.  She was apparently set to do a sleep study, so I encouraged her to go ahead and get that done.  She is quite hesitant to take any medications and would rather not even take what she has.   For now she is agreeable to increase the hydralazine to 37.5 mg three times daily.  She should take it each morning and bedtime, then about half way in between those, rather than try to stay awake at night to keep doses at exactly 8 hour intervals.  She will continue with the Bystolic and clonidine as well.  She will need to take her BP at home twice daily and was given detailed instructions on proper technique.  She will bring this log, as well as her come cuff to her next appointment in 2 weeks.  If her pressure is still elevated and she cannot be convinced to further increase the hydralazine, I would consider trying spironolactone or a different ARB from losartan.    Also had a long discussion about how changes in diet and exercise can be beneficial to lowering BP.  She was encouraged to get back out and mow her lawn and walk daily, avoid fast/fried foods and eat a better mix of proteins and vegetables.     Phillips Hay PharmD CPP [facility]

## 2016-10-26 NOTE — Patient Instructions (Signed)
Return for a a follow up appointment in 3 weeks  Your blood pressure today is 162/96  (goal is < 130/80)  Check your blood pressure at home twice daily and keep record of the readings.  Take your BP meds as follows:  AM:  Clondine, hydralazine (1.5 tablets), Bystolic  Mid-day: hydralazine  PM:  Clonidine, hydralzine (1.5 tablets)  Bring all of your meds, your BP cuff and your record of home blood pressures to your next appointment.  Exercise as you're able, try to walk approximately 30 minutes per day.  Keep salt intake to a minimum, especially watch canned and prepared boxed foods.  Eat more fresh fruits and vegetables and fewer canned items.  Avoid eating in fast food restaurants.    HOW TO TAKE YOUR BLOOD PRESSURE: . Rest 5 minutes before taking your blood pressure. .  Don't smoke or drink caffeinated beverages for at least 30 minutes before. . Take your blood pressure before (not after) you eat. . Sit comfortably with your back supported and both feet on the floor (don't cross your legs). . Elevate your arm to heart level on a table or a desk. . Use the proper sized cuff. It should fit smoothly and snugly around your bare upper arm. There should be enough room to slip a fingertip under the cuff. The bottom edge of the cuff should be 1 inch above the crease of the elbow. . Ideally, take 3 measurements at one sitting and record the average.

## 2016-10-27 ENCOUNTER — Other Ambulatory Visit: Payer: Self-pay | Admitting: Pharmacist Clinician (PhC)/ Clinical Pharmacy Specialist

## 2016-10-27 MED ORDER — NEBIVOLOL HCL 10 MG PO TABS: 10.0000 mg | ORAL_TABLET | Freq: Every day | ORAL | 3 refills | Status: DC

## 2016-10-27 MED ORDER — HYDRALAZINE HCL 25 MG PO TABS: 37.5000 mg | ORAL_TABLET | Freq: Three times a day (TID) | ORAL | 3 refills | Status: DC

## 2016-10-27 MED ORDER — NEBIVOLOL HCL 10 MG PO TABS
10.0000 mg | ORAL_TABLET | Freq: Every day | ORAL | 3 refills | Status: DC
Start: 2016-10-27 — End: 2016-10-27

## 2016-11-01 ENCOUNTER — Ambulatory Visit: Payer: Medicare Other | Admitting: Internal Medicine

## 2016-11-10 ENCOUNTER — Ambulatory Visit: Payer: Medicare Other

## 2016-11-11 ENCOUNTER — Ambulatory Visit (INDEPENDENT_AMBULATORY_CARE_PROVIDER_SITE_OTHER): Payer: Medicare Other | Admitting: Pharmacist

## 2016-11-11 VITALS — BP 142/84 | HR 58

## 2016-11-11 DIAGNOSIS — Z6833 Body mass index (BMI) 33.0-33.9, adult: Secondary | ICD-10-CM | POA: Diagnosis not present

## 2016-11-11 DIAGNOSIS — E876 Hypokalemia: Secondary | ICD-10-CM | POA: Diagnosis not present

## 2016-11-11 DIAGNOSIS — I10 Essential (primary) hypertension: Secondary | ICD-10-CM

## 2016-11-11 MED ORDER — HYDRALAZINE HCL 50 MG PO TABS: 50.0000 mg | ORAL_TABLET | Freq: Three times a day (TID) | ORAL | 0 refills | Status: DC

## 2016-11-11 NOTE — Progress Notes (Signed)
HPI:  [name] is a 66 y.o. female patient of Dr Antoine PocheHochrein, with a PMH significant for hypertension, prior stroke, thyroid disease, depression and arthritis.  She reports having issues with her blood pressure for the past couple of years and that for the past 2-3 months it has been going from high to low and back. She notes that it is not uncommon for the diastolic to be > 161100.     Patient states sensitivities to multiple medications not listed in her allergy profile.  She states that after her stroke in 2010 she was sent home from the hospital on 5 BP medications.  She was under the assumption that she would only take them for a month or two then discontinue.   When her MD kept her on them for > 1 year, she determined that they caused her to have more problems.  She quit everything and has had problems with all the meds she has tried since then. She also believes that there is an underlying cause for all hypertension and she would rather focus on finding that problem and solving it.  Patient asked multiple times about for lelectrolytes, thyroid and cortisol testing.  Most recent CMP discussed with patient but all other testing was defer to discuss with PCP during next week follow up appointment.   Patient reports multiple symptoms including blurry vision, frequent headaches, muscle aches, chills and increase mucous production but none of the symptoms correlate with timeframe for  initiation of medication. Blurry vision problems stared > 3 months ago and is consistent. Headaches are recurrent but not related to periods of elevated BP per patient statements. Recommendation given to patient to seek ophthalmologist evaluation for blurry vision problems. She denies chest pain, increase swelling, shortness of breath, increase fatigue or dizziness.   Blood Pressure Goal:  130/80       Current Medications:  Hydralazine 37.5 mg three times daily  nebivolol 10 mg every morning  Clonidine 0.1 mg  twice daily  Family Hx:  Mother had htn, died at 476 from DM complications  Father with htn now 3783, only since 9080  3 of 4 siblings with htn  Social Hx:  No tobacco, no alcohol (rare wine); rare caffeine  Diet:  More home cooked, no added salt; occasional fried when eating out  Exercise:  No exercise recently, for the past month worried about elevated BP so has not done her yard work or gone for walks  Home BP readings: 27 readings; average 158/98 (HR 51-61 bpm)  *HOME wrist cuff calibrated today; device in NOT accurate with diastolic reading ~ 20mmHg difference from manual reading.*  Intolerances:   Amlodipine - caused "heart to flip", creaking bones  Losartan - creaking bones, muscles "didn't feel right"  Labs:   Na 139, K 3.2, BUN 13, SCr 1.23, CrCl 61.5  Herbals: (takes vitamin packets twice daily that include the following in unknown amounts)  ashwagandha - arthritis/inflammation, hypertension  nattokinase -  hypertension  Barley - hyperlipidemia  Chlorella - diverticulitis, GI  Spirulina - arthritis/inflammation   Wt Readings from Last 3 Encounters:  10/11/16 188 lb 3.2 oz (85.4 kg)  09/28/16 186 lb 4.8 oz (84.5 kg)  12/11/15 186 lb 4 oz (84.5 kg)   BP Readings from Last 3 Encounters:  11/11/16 (!) 142/84  10/26/16 (!) 162/96  10/11/16 (!) 150/100   Pulse Readings from Last 3 Encounters:  11/11/16 (!) 58  10/26/16 68  10/11/16 75    Current  Outpatient Prescriptions  Medication Sig Dispense Refill  . Ascorbic Acid (VITAMIN C) 1000 MG tablet Take 1,000 mg by mouth every morning.     . ASHWAGANDHA PO Take by mouth.    Marland Kitchen aspirin EC 81 MG EC tablet Take 1 tablet (81 mg total) by mouth daily. 30 tablet 3  . B Complex-C (B-COMPLEX WITH VITAMIN C) tablet Take 1 tablet by mouth daily.      . Cholecalciferol (VITAMIN D PO) Take 1,000 Units by mouth daily.    . cloNIDine (CATAPRES) 0.1 MG tablet Take 1 tablet (0.1 mg total) by mouth 2 (two) times daily. Or as  directed 60 tablet 3  . hydrALAZINE (APRESOLINE) 50 MG tablet Take 1 tablet (50 mg total) by mouth 3 (three) times daily. 90 tablet 0  . LevOCARNitine (L-CARNITINE PO) Take 800 mg by mouth every morning.     . MULTIPLE VITAMIN PO Take 1 tablet by mouth daily.     Marland Kitchen NATTOKINASE PO Take 1 tablet by mouth every morning. Helps blood flow    . nebivolol (BYSTOLIC) 10 MG tablet Take 1 tablet (10 mg total) by mouth daily. 90 tablet 3  . NON FORMULARY Barley Green:  Takes 1 tsp daily    . NON FORMULARY Chlorella:  Takes 15 capsules daily    . POTASSIUM PO Take 1 tablet by mouth daily.    . Probiotic Product (PROBIOTIC PO) Take 1 tablet by mouth daily. Bifido Beadlet 50+    . SPIRULINA PO Take 1 teaspoon by mouth daily     No current facility-administered medications for this visit.       Past Medical History:  Diagnosis Date  . Arthritis   . Chicken pox   . Depression   . Diverticulitis   . Hypertension   . Sciatica   . Seasonal allergies   . Stroke (HCC) 09/2008  . Thyroid disease     Blood pressure (!) 142/84, pulse (!) 58, SpO2 95 %.  Hypertension Blood pressure remains above desired goal but improved since last office visit. Patient continues to work on lifestyle modifications as well as compliance with medication therapy. Noted her wrist BP device is inaccurate and recommendation was given to check BP at local pharmacy or fire department until new device acquired. Will increase her hydralazine to 50mg  three times daily and continue close monitoring. Plan to switch clonidine to Irbesartan or clonidine patch during next office visit if additional BP control needed. Patient is very reluctant to start new medication, will need further medication management to assure compliance. Next f/u appointment to be schedule 4 weeks after PCP follow up.  [name] PharmD, BCPS, CPP [facility] [address] [place] [address] 11/12/2016 8:29 AM

## 2016-11-11 NOTE — Patient Instructions (Addendum)
Return for a follow up appointment in 4 weeks (call after appointment with PCP)  Your blood pressure today is 142/84 pulse 58  Check your blood pressure at home daily (if able) and keep record of the readings.  Take your BP meds as follows: **INCREASE hydralazine to 50mg  three times daily **continue all other medication as prescribed  Bring BP cuff and your record of home blood pressures to your next appointment.  Exercise as you're able, try to walk approximately 30 minutes per day.  Keep salt intake to a minimum, especially watch canned and prepared boxed foods.  Eat more fresh fruits and vegetables and fewer canned items.  Avoid eating in fast food restaurants.    HOW TO TAKE YOUR BLOOD PRESSURE: . Rest 5 minutes before taking your blood pressure. .  Don't smoke or drink caffeinated beverages for at least 30 minutes before. . Take your blood pressure before (not after) you eat. . Sit comfortably with your back supported and both feet on the floor (don't cross your legs). . Elevate your arm to heart level on a table or a desk. . Use the proper sized cuff. It should fit smoothly and snugly around your bare upper arm. There should be enough room to slip a fingertip under the cuff. The bottom edge of the cuff should be 1 inch above the crease of the elbow. . Ideally, take 3 measurements at one sitting and record the average.

## 2016-11-12 NOTE — Assessment & Plan Note (Addendum)
Blood pressure remains above desired goal but improved since last office visit. Patient continues to work on lifestyle modifications as well as compliance with medication therapy. Noted her wrist BP device is inaccurate and recommendation was given to check BP at local pharmacy or fire department until new device acquired. Will increase her hydralazine to 50mg  three times daily and continue close monitoring. Plan to switch clonidine to Irbesartan or clonidine patch during next office visit if additional BP control needed. Patient is very reluctant to start new medication, will need further medication management to assure compliance. Next f/u appointment to be schedule 4 weeks after PCP follow up.

## 2016-11-16 NOTE — Progress Notes (Signed)
Chief Complaint  Patient presents with  . Follow-up    Pt states she is doing well, still trying to keep her BP consistant     HPI: [name] 66 y.o. come in for Chronic disease management   She is now being seen in the hypertension clinic and her blood pressure is coming down she is taking clonidine and hydralazine in addition to her other medicines on a regular basis she has gotten some headaches with this but seems to adapt.  She did see Dr. Shea Evansunn on the kidney doctor who state stated that her potassium and her kidney function were stable.  She has no new vision problems but does complain of some blurriness at times and not being able to see close up but no diplopia.   She did stop one supplement that may have had licorice in it for adrenal support.  She has gotten some bumps on her body did itch and then fade not sure what that is from.  Has not had a sleep apnea evaluation yet as we had delayed referrals to get her blood pressure in order and renal evaluation. ROS: See pertinent positives and negatives per HPI.  Past Medical History:  Diagnosis Date  . Arthritis   . Chicken pox   . Depression   . Diverticulitis   . Elevated urinary free cortisol level 03/25/2014   mild with low potasssium  endo consult   disc with patient    . Hypertension   . Sciatica   . Seasonal allergies   . Stroke (HCC) 09/2008  . Thyroid disease     Family History  Problem Relation Age of Onset  . Heart failure Mother   . Heart disease Mother   . Diabetes Mother   . Hypertension Mother   . Hypertension Father   . Diabetes Maternal Grandmother   . Hypertension Sister   . Hypertension Brother   . Cancer Brother        Prostate  . Hypertension Sister     Social History   Social History  . Marital status: Widowed    Spouse name: N/A  . Number of children: N/A  . Years of education: N/A   Social History Main Topics  . Smoking status: Never Smoker  . Smokeless tobacco:  Never Used  . Alcohol use Yes     Comment: Social Use only  . Drug use: No  . Sexual activity: Not Currently   Other Topics Concern  . 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 Ascorbic Acid (VITAMIN C) 1000 MG tablet Take 1,000 mg by mouth every morning.     . ASHWAGANDHA PO Take by mouth.    Marland Kitchen. aspirin EC 81 MG EC tablet Take 1 tablet (81 mg total) by mouth daily. 30 tablet 3  . B Complex-C (B-COMPLEX WITH VITAMIN C) tablet Take 1 tablet by mouth daily.      . Cholecalciferol (VITAMIN D PO) Take 1,000 Units by mouth daily.    .Marland Kitchen  cloNIDine (CATAPRES) 0.1 MG tablet Take 1 tablet (0.1 mg total) by mouth 2 (two) times daily. Or as directed 60 tablet 3  . hydrALAZINE (APRESOLINE) 50 MG tablet Take 1 tablet (50 mg total) by mouth 3 (three) times daily. 90 tablet 0  . LevOCARNitine (L-CARNITINE PO) Take 800 mg by mouth every morning.     . MULTIPLE VITAMIN PO Take 1 tablet by mouth daily.     Marland Kitchen NATTOKINASE PO Take 1 tablet by mouth every morning. Helps blood flow    . nebivolol (BYSTOLIC) 10 MG tablet Take 1 tablet (10 mg total) by mouth daily. 90 tablet 3  . NON FORMULARY Barley Green:  Takes 1 tsp daily    . NON FORMULARY Chlorella:  Takes 15 capsules daily    . POTASSIUM PO Take 1 tablet by mouth daily.    . Probiotic Product (PROBIOTIC PO) Take 1 tablet by mouth daily. Bifido Beadlet 50+    . SPIRULINA PO Take 1 teaspoon by mouth daily     No facility-administered medications prior to visit.      EXAM:  BP 140/82 (BP Location: Left Arm, Cuff Size: Large)   Pulse 68   Temp 98.6 F (37 C) (Oral)   Ht  5\' 3"  (1.6 m)   Wt 186 lb (84.4 kg)   SpO2 96%   BMI 32.95 kg/m   Body mass index is 32.95 kg/m.  GENERAL: vitals reviewed and listed above, alert, oriented, appears well hydrated and in no acute distress HEENT: atraumatic, conjunctiva  clear, no obvious abnormalities on inspection of external nose and ears MS: moves all extremities without noticeable focal  abnormality PSYCH: pleasant and cooperative, no obvious depression or anxiety Lab Results  Component Value Date   WBC 8.6 09/29/2016   HGB 13.9 09/29/2016   HCT 42.0 09/29/2016   PLT 181 09/29/2016   GLUCOSE 99 09/29/2016   CHOL 174 09/29/2016   TRIG 96 09/29/2016   HDL 67 09/29/2016   LDLCALC 88 09/29/2016   ALT 16 09/29/2016   AST 24 09/29/2016   NA 139 09/29/2016   K 3.2 (L) 09/29/2016   CL 107 09/29/2016   CREATININE 1.23 (H) 09/29/2016   BUN 13 09/29/2016   CO2 26 09/29/2016   TSH 3.655 09/28/2016   INR 0.99 09/28/2016   HGBA1C 5.3 09/29/2016   BP Readings from Last 3 Encounters:  11/17/16 140/82  11/11/16 (!) 142/84  10/26/16 (!) 162/96   Wt Readings from Last 3 Encounters:  11/17/16 186 lb (84.4 kg)  10/11/16 188 lb 3.2 oz (85.4 kg)  09/28/16 186 lb 4.8 oz (84.5 kg)    24 hour UFcortisol borderline and nl on repeat in 2016 36.9 ASSESSMENT AND PLAN:  Discussed the following assessment and plan:  Resistant hypertension ? - Plan: Ambulatory referral to Neurology  Snoring - Plan: Ambulatory referral to Neurology  Alternative medicine  History of CVA (cerebrovascular accident) - Plan: Ambulatory referral to Neurology  Mild renal insufficiency  Influenza vaccination declined Although do not have the note of the labs done from the renal doctor Dr. Eliott Nine I'm assuming it was not felt she had secondary hypertension and to continue current tract in the hypertension clinic. If not consideration would have been to add spironolactone. However apparently her labs are normal stable. She has continued  snoring and history suggestive of possible sleep apnea in addition to her history of funny neurologic signs at times. We'll proceed with a referral to Dr. Vickey Huger who is a  specialist in sleep and neurology to get opinion about further evaluation. Patient feels she is very sensitive to many medicines and tries to do things the "natural way" but decided she wanted to try to get this blood pressure under control and I have congratulated her on her efforts. Plan rov in 4 mos    Total visit > 50% spent counseling and coordinating care as indicated in above note and in instructions to patient .     -Patient advised to return or notify health care team  if  new concerns arise.  Patient Instructions  Your blood pressure  Is coming along Keep on going .   Good  job  Taking the medication and getting through   The   Annoying side effects   .     Get  opthalmologic   Check for  Blurry vision    Will do a referral for sleep etc  Dr Marylou Flesher.  neurologist who is sleep certified.      Keep up appts. with the hypertension clinic.   Neta Mends. Panosh M.D.

## 2016-11-17 ENCOUNTER — Ambulatory Visit (INDEPENDENT_AMBULATORY_CARE_PROVIDER_SITE_OTHER): Payer: Medicare Other | Admitting: Internal Medicine

## 2016-11-17 ENCOUNTER — Encounter: Payer: Self-pay | Admitting: Internal Medicine

## 2016-11-17 VITALS — BP 140/82 | HR 68 | Temp 98.6°F | Ht 63.0 in | Wt 186.0 lb

## 2016-11-17 DIAGNOSIS — Z2821 Immunization not carried out because of patient refusal: Secondary | ICD-10-CM | POA: Insufficient documentation

## 2016-11-17 DIAGNOSIS — Z8673 Personal history of transient ischemic attack (TIA), and cerebral infarction without residual deficits: Secondary | ICD-10-CM

## 2016-11-17 DIAGNOSIS — Z5189 Encounter for other specified aftercare: Secondary | ICD-10-CM | POA: Diagnosis not present

## 2016-11-17 DIAGNOSIS — I10 Essential (primary) hypertension: Secondary | ICD-10-CM

## 2016-11-17 DIAGNOSIS — N289 Disorder of kidney and ureter, unspecified: Secondary | ICD-10-CM

## 2016-11-17 DIAGNOSIS — R0683 Snoring: Secondary | ICD-10-CM | POA: Diagnosis not present

## 2016-11-17 NOTE — Patient Instructions (Addendum)
Your blood pressure  Is coming along Keep on going .   Good  job  Taking the medication and getting through   The   Annoying side effects   .     Get  opthalmologic   Check for  Blurry vision    Will do a referral for sleep etc  Dr Marylou Flesherohmier.  neurologist who is sleep certified.      Keep up appts. with the hypertension clinic.

## 2017-04-03 NOTE — Progress Notes (Deleted)
No chief complaint on file.   HPI: [name] 67 y.o. come in for Chronic disease management  ROS: See pertinent positives and negatives per HPI.  Past Medical History:  Diagnosis Date  . Arthritis   . Chicken pox   . Depression   . Diverticulitis   . Elevated urinary free cortisol level 03/25/2014   mild with low potasssium  endo consult   disc with patient    . Hypertension   . Sciatica   . Seasonal allergies   . Stroke (HCC) 09/2008  . Thyroid disease     Family History  Problem Relation Age of Onset  . Heart failure Mother   . Heart disease Mother   . Diabetes Mother   . Hypertension Mother   . Hypertension Father   . Diabetes Maternal Grandmother   . Hypertension Sister   . Hypertension Brother   . Cancer Brother        Prostate  . Hypertension Sister     Social History   Socioeconomic History  . Marital status: Widowed    Spouse name: Not on file  . Number of children: Not on file  . Years of education: Not on file  . Highest education level: Not on file  Social Needs  . Financial resource strain: Not on file  . Food insecurity - worry: Not on file  . Food insecurity - inability: Not on file  . Transportation needs - medical: Not on file  . Transportation needs - non-medical: Not on file  Occupational History  . Not on file  Tobacco Use  . Smoking status: Never Smoker  . Smokeless tobacco: Never Used  Substance and Sexual Activity  . Alcohol use: Yes    Comment: Social Use only  . Drug use: No  . Sexual activity: Not Currently  Other Topics Concern  . 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 Ascorbic Acid (VITAMIN C) 1000 MG tablet Take 1,000 mg by mouth every morning.     . ASHWAGANDHA PO Take by mouth.    Marland Kitchen aspirin EC 81 MG EC tablet Take 1 tablet (81 mg total) by mouth daily. 30 tablet 3  . B Complex-C (B-COMPLEX WITH VITAMIN C) tablet Take 1 tablet by mouth daily.      . Cholecalciferol (VITAMIN D PO) Take 1,000 Units by mouth daily.    . cloNIDine (CATAPRES) 0.1 MG tablet Take 1 tablet (0.1 mg total) by mouth 2 (two) times daily. Or as directed 60 tablet 3  . hydrALAZINE (APRESOLINE) 50 MG tablet Take 1 tablet (50 mg total) by mouth 3 (three) times daily. 90 tablet 0  . LevOCARNitine (L-CARNITINE PO) Take 800 mg by mouth every morning.     . MULTIPLE VITAMIN PO Take 1 tablet by mouth daily.     Marland Kitchen NATTOKINASE PO Take 1 tablet by mouth every morning. Helps blood flow    . nebivolol (BYSTOLIC) 10 MG tablet Take 1 tablet (10 mg total) by  mouth daily. 90 tablet 3  . NON FORMULARY Barley Green:  Takes 1 tsp daily    . NON FORMULARY Chlorella:  Takes 15 capsules daily    . POTASSIUM PO Take 1 tablet by mouth daily.    . Probiotic Product (PROBIOTIC PO) Take 1 tablet by mouth daily. Bifido Beadlet 50+    . SPIRULINA PO Take 1 teaspoon by mouth daily     No facility-administered medications prior to visit.      EXAM:  There were no vitals taken for this visit.  There is no height or weight on file to calculate BMI.  GENERAL: vitals reviewed and listed above, alert, oriented, appears well hydrated and in no acute distress HEENT: atraumatic, conjunctiva  clear, no obvious abnormalities on inspection of external nose and ears OP : no lesion edema or exudate  NECK: no obvious masses on inspection palpation  LUNGS: clear to auscultation bilaterally, no wheezes, rales or rhonchi, good air movement CV: HRRR, no clubbing cyanosis or  peripheral edema nl cap refill  MS: moves all extremities without noticeable focal   abnormality PSYCH: pleasant and cooperative, no obvious depression or anxiety Lab Results  Component Value Date   WBC 8.6 09/29/2016   HGB 13.9 09/29/2016   HCT 42.0 09/29/2016   PLT 181 09/29/2016   GLUCOSE 99 09/29/2016   CHOL 174 09/29/2016   TRIG 96 09/29/2016   HDL 67 09/29/2016   LDLCALC 88 09/29/2016   ALT 16 09/29/2016   AST 24 09/29/2016   NA 139 09/29/2016   K 3.2 (L) 09/29/2016   CL 107 09/29/2016   CREATININE 1.23 (H) 09/29/2016   BUN 13 09/29/2016   CO2 26 09/29/2016   TSH 3.655 09/28/2016   INR 0.99 09/28/2016   HGBA1C 5.3 09/29/2016   BP Readings from Last 3 Encounters:  11/17/16 140/82  11/11/16 (!) 142/84  10/26/16 (!) 162/96    ASSESSMENT AND PLAN:  Discussed the following assessment and plan:  Hypertensive heart disease without heart failure  Medication management  Right thyroid nodule  History of CVA (cerebrovascular accident)  Low serum potassium level  Hashimoto's disease  Mild renal insufficiency ?ht fu potassium  -Patient advised to return or notify health care team  if  new concerns arise.  There are no Patient Instructions on file for this visit.   Neta MendsWanda K. Chaz Mcglasson M.D.

## 2017-04-04 ENCOUNTER — Ambulatory Visit: Payer: Medicare Other | Admitting: Internal Medicine

## 2017-04-04 DIAGNOSIS — Z0289 Encounter for other administrative examinations: Secondary | ICD-10-CM

## 2017-05-15 DIAGNOSIS — M25511 Pain in right shoulder: Secondary | ICD-10-CM | POA: Diagnosis not present

## 2017-05-15 DIAGNOSIS — R109 Unspecified abdominal pain: Secondary | ICD-10-CM | POA: Diagnosis not present

## 2017-05-17 ENCOUNTER — Encounter: Payer: Self-pay | Admitting: Adult Health

## 2017-05-17 ENCOUNTER — Ambulatory Visit (INDEPENDENT_AMBULATORY_CARE_PROVIDER_SITE_OTHER): Payer: Medicare Other | Admitting: Adult Health

## 2017-05-17 VITALS — BP 180/110 | HR 70 | Temp 98.4°F | Wt 191.1 lb

## 2017-05-17 DIAGNOSIS — I10 Essential (primary) hypertension: Secondary | ICD-10-CM | POA: Diagnosis not present

## 2017-05-17 NOTE — Patient Instructions (Signed)
It was great meeting you today   As recommended by Dr. Fabian SharpPanosh, please increase Bystolic from 10 mg to 20 mg and take the clonidine 0.1 mg, three times a day   Call the hypertension clinic tomorrow to make an appointment with them

## 2017-05-17 NOTE — Progress Notes (Signed)
Subjective:    Patient ID: [name], female    DOB: 09/26/1950, 67 y.o.   MRN: [id number]  HPI  67 year old female who  has a past medical history of Arthritis, Chicken pox, Depression, Diverticulitis, Elevated urinary free cortisol level (03/25/2014), Hypertension, Sciatica, Seasonal allergies, Stroke (HCC) (09/2008), and Thyroid disease.   She presents to the office today for elevated blood pressure readings. She reports that her blood pressure has been elevated for the last 4-5 days with readings upwards of 170-180/90-100's.   She currently takes Bystolic 10 mg, Hydralazine 50 mg, clonidine 0.1 mg. Is followed by hypertension clinic   Denies any headaches or blurred vision    BP Readings from Last 3 Encounters:  05/17/17 (!) 180/110  11/17/16 140/82  11/11/16 (!) 142/84    Review of Systems See HPI   Past Medical History:  Diagnosis Date  . Arthritis   . Chicken pox   . Depression   . Diverticulitis   . Elevated urinary free cortisol level 03/25/2014   mild with low potasssium  endo consult   disc with patient    . Hypertension   . Sciatica   . Seasonal allergies   . Stroke (HCC) 09/2008  . Thyroid disease     Social History   Socioeconomic History  . Marital status: Widowed    Spouse name: Not on file  . Number of children: Not on file  . Years of education: Not on file  . Highest education level: Not on file  Social Needs  . Financial resource strain: Not on file  . Food insecurity - worry: Not on file  . Food insecurity - inability: Not on file  . Transportation needs - medical: Not on file  . Transportation needs - non-medical: Not on file  Occupational History  . Not on file  Tobacco Use  . Smoking status: Never Smoker  . Smokeless tobacco: Never Used  Substance and Sexual Activity  . Alcohol use: Yes    Comment: Social Use only  . Drug use: No  . Sexual activity: Not Currently  Other Topics Concern  . 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 MINOR BREAST BIOPSY  1979  . WISDOM TOOTH EXTRACTION      Family History  Problem Relation Age of Onset  . Heart failure Mother   . Heart disease Mother   . Diabetes Mother   . Hypertension Mother   . Hypertension Father   . Diabetes Maternal Grandmother   . Hypertension Sister   . Hypertension Brother   . Cancer Brother        Prostate  . Hypertension Sister     Allergies  Allergen Reactions  . Isovue [Iopamidol] Itching    Pt states she had itching after CT in 2015.  Marland Kitchen. Penicillins Swelling    Has patient had a PCN reaction causing immediate rash, facial/tongue/throat swelling, SOB or lightheadedness with hypotension: yes Has patient had a PCN reaction causing severe rash involving mucus  membranes or skin necrosis: no Has patient had a PCN reaction that required hospitalization no Has patient had a PCN reaction occurring within the last 10 years: no If all of the above answers are "NO", then may proceed with Cephalosporin use.   . Sulfa Antibiotics Itching    Current Outpatient Medications on File Prior to Visit  Medication Sig Dispense Refill  . Ascorbic Acid (VITAMIN C) 1000 MG tablet Take 1,000 mg by mouth every morning.     . ASHWAGANDHA PO Take by mouth.    Marland Kitchen aspirin EC 81 MG EC tablet Take 1 tablet (81 mg total) by mouth daily. 30 tablet 3  . B Complex-C (B-COMPLEX WITH VITAMIN C) tablet Take 1 tablet by mouth daily.      . Cholecalciferol (VITAMIN D PO) Take 1,000 Units by mouth daily.    . cloNIDine (CATAPRES) 0.1 MG tablet Take 1 tablet (0.1 mg total) by mouth 2 (two) times daily.  Or as directed 60 tablet 3  . hydrALAZINE (APRESOLINE) 50 MG tablet Take 1 tablet (50 mg total) by mouth 3 (three) times daily. 90 tablet 0  . LevOCARNitine (L-CARNITINE PO) Take 800 mg by mouth every morning.     . MULTIPLE VITAMIN PO Take 1 tablet by mouth daily.     Marland Kitchen NATTOKINASE PO Take 1 tablet by mouth every morning. Helps blood flow    . nebivolol (BYSTOLIC) 10 MG tablet Take 1 tablet (10 mg total) by mouth daily. 90 tablet 3  . NON FORMULARY Barley Green:  Takes 1 tsp daily    . NON FORMULARY Chlorella:  Takes 15 capsules daily    . POTASSIUM PO Take 1 tablet by mouth daily.    . Probiotic Product (PROBIOTIC PO) Take 1 tablet by mouth daily. Bifido Beadlet 50+    . SPIRULINA PO Take 1 teaspoon by mouth daily     No current facility-administered medications on file prior to visit.     BP (!) 180/110 (BP Location: Left Arm, Patient Position: Sitting, Cuff Size: Large)   Pulse 70   Temp 98.4 F (36.9 C) (Oral)   Wt 191 lb 1.6 oz (86.7 kg)   SpO2 97%   BMI 33.85 kg/m       Objective:   Physical Exam  Constitutional: She is oriented to person, place, and time. She appears well-developed and well-nourished. No distress.  Eyes: Conjunctivae and EOM are normal. Pupils are equal, round, and reactive to light. Right eye exhibits no discharge. Left eye exhibits no discharge.  Cardiovascular: Normal rate, regular rhythm, normal heart sounds and intact distal pulses. Exam reveals no gallop.  No murmur heard. Pulmonary/Chest: Effort normal. No respiratory distress. She has no wheezes. She has no rales. She exhibits no tenderness.  Neurological: She is alert and oriented to person, place, and time.  Skin: Skin is warm and dry. No rash noted. She is not diaphoretic. No erythema. No pallor.  Psychiatric: She has a normal mood and affect. Her behavior is normal. Judgment and thought content normal.  Nursing note and vitals reviewed.     Assessment & Plan:  1. Essential hypertension -  Spoke with her PCP who advised temporarily taking 20 mg of Bystolic and taking Hydralazine 0.1 mg TID ( PRN) - She is to call the hypertension clinic tomorrow for follow up   Shirline Frees, NP

## 2017-07-07 ENCOUNTER — Other Ambulatory Visit: Payer: Self-pay | Admitting: Cardiology

## 2017-07-07 MED ORDER — HYDRALAZINE HCL 50 MG PO TABS: 50.0000 mg | ORAL_TABLET | Freq: Three times a day (TID) | ORAL | 0 refills | Status: DC

## 2017-07-07 NOTE — Telephone Encounter (Signed)
°*  STAT* If patient is at the pharmacy, call can be transferred to refill team.   1. Which medications need to be refilled? (please list name of each medication and dose if known) Hydralazine 50 mg-need prescription the mail order and local pharmacy-enough until her mail order comes in 2. Which pharmacy/location (including street and city if local pharmacy) is medication to be sent to?[facility] on [address],[place]  3. Do they need a 30 day or 90 day supply? [facility] and [facility]

## 2017-07-08 DIAGNOSIS — N390 Urinary tract infection, site not specified: Secondary | ICD-10-CM | POA: Diagnosis not present

## 2017-07-08 DIAGNOSIS — Z6833 Body mass index (BMI) 33.0-33.9, adult: Secondary | ICD-10-CM | POA: Diagnosis not present

## 2017-07-08 DIAGNOSIS — E876 Hypokalemia: Secondary | ICD-10-CM | POA: Diagnosis not present

## 2017-07-08 DIAGNOSIS — I10 Essential (primary) hypertension: Secondary | ICD-10-CM | POA: Diagnosis not present

## 2017-07-08 LAB — BASIC METABOLIC PANEL
BUN: 13 (ref 4–21)
CREATININE: 1.1 (ref 0.5–1.1)
GLUCOSE: 90
POTASSIUM: 3.4 (ref 3.4–5.3)
Sodium: 139 (ref 137–147)

## 2017-07-27 NOTE — Progress Notes (Signed)
Cardiology Office Note   Date:  07/28/2017   ID:  [name], DOB [birth year], MRN [id number]  PCP:  Madelin Headings, MD  Cardiologist:   Rollene Rotunda, MD   Chief Complaint  Patient presents with  . Fatigue      History of Present Illness: [name] is a 67 y.o. female who presents for for evaluation of difficult to control hypertension.  She has been followed in the HTN Clinic.  Since she was last seen her blood pressures have been fluctuating.  She only takes them in the morning and they are typically in the 170s or 180s over the mid to high 90s.  She does not take her blood pressure the rest of the day.  Her meds have been titrated and she feels fatigued after she takes them.  She has not had any presyncope or syncope.  She denies any chest pressure, neck or arm discomfort.  She is has not had any new palpitations.  She is not describing new shortness of breath, PND or orthopnea.  She does have left leg pain and it feels somewhat like the sciatica she had previously.  Past Medical History:  Diagnosis Date  . Arthritis   . Chicken pox   . Depression   . Diverticulitis   . Elevated urinary free cortisol level 03/25/2014   mild with low potasssium  endo consult   disc with patient    . Hypertension   . Sciatica   . Seasonal allergies   . Stroke (HCC) 09/2008  . Thyroid disease     Past Surgical History:  Procedure Laterality Date  . MINOR BREAST BIOPSY  1979  . WISDOM TOOTH EXTRACTION       Current Outpatient Medications  Medication Sig Dispense Refill  . Ascorbic Acid (VITAMIN C) 1000 MG tablet Take 1,000 mg by mouth every morning.     . ASHWAGANDHA PO Take by mouth.    . B Complex-C (B-COMPLEX WITH VITAMIN C) tablet Take 1 tablet by mouth daily.      . Cholecalciferol (VITAMIN D PO) Take 1,000 Units by mouth daily.    . cloNIDine (CATAPRES) 0.1 MG tablet Take 0.1 mg by mouth 2 (two) times daily.     . hydrALAZINE (APRESOLINE) 50 MG tablet Take  1 tablet (50 mg total) by mouth 3 (three) times daily. 270 tablet 3  . LevOCARNitine (L-CARNITINE PO) Take 800 mg by mouth every morning.     . Magnesium Hydroxide (MAGNESIA PO) Take 200 mg by mouth daily.    . MULTIPLE VITAMIN PO Take 1 tablet by mouth daily.     Marland Kitchen NATTOKINASE PO Take 1 tablet by mouth every morning. Helps blood flow    . Nebivolol HCl (BYSTOLIC) 20 MG TABS Take 1 tablet by mouth every morning.    . NON FORMULARY Barley Green:  Takes 1 tsp daily    . NON FORMULARY Chlorella:  Takes 15 capsules daily    . Probiotic Product (PROBIOTIC PO) Take 1 tablet by mouth daily. Bifido Beadlet 50+    . PROTEIN PO Take 1 tablet by mouth daily.    Marland Kitchen SPIRULINA PO Take 1 teaspoon by mouth daily    . spironolactone (ALDACTONE) 25 MG tablet Take 1 tablet (25 mg total) by mouth daily. 90 tablet 3   No current facility-administered medications for this visit.     Allergies:   Isovue [iopamidol]; Penicillins; and Sulfa antibiotics    ROS:  Please see the  history of present illness.   Otherwise, review of systems are positive for none.   All other systems are reviewed and negative.    PHYSICAL EXAM: VS:  BP (!) 160/112 (BP Location: Left Arm, Patient Position: Sitting, Cuff Size: Large)   Pulse 66   Ht  (1.6 m)   Wt 190 lb (86.2 kg)   BMI 33.66 kg/m  , BMI Body mass index is 33.66 kg/m.  GENERAL:  Well appearing NECK:  No jugular venous distention, waveform within normal limits, carotid upstroke brisk and symmetric, no bruits, no thyromegaly LUNGS:  Clear to auscultation bilaterally CHEST:  Unremarkable HEART:  PMI not displaced or sustained,S1 and S2 within normal limits, no S3, no S4, no clicks, no rubs, no murmurs ABD:  Flat, positive bowel sounds normal in frequency in pitch, no bruits, no rebound, no guarding, no midline pulsatile mass, no hepatomegaly, no splenomegaly EXT:  2 plus pulses throughout, no edema, no cyanosis no clubbing   EKG:  EKG is not ordered  today. Sinus rhythm, rate 66, left bundle branch block, left axis deviation, no change from previous.  Recent Labs: 09/28/2016: TSH 3.655 09/29/2016: ALT 16; BUN 13; Creatinine, Ser 1.23; Hemoglobin 13.9; Platelets 181; Potassium 3.2; Sodium 139    Lipid Panel    Component Value Date/Time   CHOL 174 09/29/2016 0419   TRIG 96 09/29/2016 0419   HDL 67 09/29/2016 0419   CHOLHDL 2.6 09/29/2016 0419   VLDL 19 09/29/2016 0419   LDLCALC 88 09/29/2016 0419      Wt Readings from Last 3 Encounters:  07/28/17 190 lb (86.2 kg)  05/17/17 191 lb 1.6 oz (86.7 kg)  11/17/16 186 lb (84.4 kg)      Other studies Reviewed: Additional studies/ records that were reviewed today include:  Labs  ASSESSMENT AND PLAN:  HTN:   She feels very fatigued with the hydralazine and clonidine 3 times daily.  I am going to start spironolactone 25 mg daily.  I do not see that she is ever been on this.  She is had low potassium so this may help.  She will reduce the clonidine to twice daily.  I will order a basic metabolic profile in 7 days.  Of note her BP was 174/98 on repeat.   PVCs:  She has a lot of ectopy including PVCs and PACs.      LBBB:   This is chronic.  We discussed this again today.  Otherwise no change in therapy.   Current medicines are reviewed at length with the patient today.  The patient does not have concerns regarding medicines.  The following changes have been made:  none  Labs/ tests ordered today include:     Disposition:   FU with me in  1 month    Signed, Rollene Rotunda, MD  07/28/2017 2:50 PM    [facility]

## 2017-07-28 ENCOUNTER — Ambulatory Visit (INDEPENDENT_AMBULATORY_CARE_PROVIDER_SITE_OTHER): Payer: Medicare Other | Admitting: Cardiology

## 2017-07-28 ENCOUNTER — Encounter: Payer: Self-pay | Admitting: Cardiovascular Disease

## 2017-07-28 ENCOUNTER — Encounter: Payer: Self-pay | Admitting: Cardiology

## 2017-07-28 VITALS — BP 160/112 | HR 66 | Ht 63.0 in | Wt 190.0 lb

## 2017-07-28 DIAGNOSIS — I10 Essential (primary) hypertension: Secondary | ICD-10-CM | POA: Diagnosis not present

## 2017-07-28 DIAGNOSIS — R5383 Other fatigue: Secondary | ICD-10-CM

## 2017-07-28 MED ORDER — SPIRONOLACTONE 25 MG PO TABS: 25.0000 mg | ORAL_TABLET | Freq: Every day | ORAL | 3 refills | Status: DC

## 2017-07-28 MED ORDER — HYDRALAZINE HCL 50 MG PO TABS: 50.0000 mg | ORAL_TABLET | Freq: Three times a day (TID) | ORAL | 3 refills | Status: DC

## 2017-07-28 NOTE — Patient Instructions (Signed)
Medication Instructions:  DECREASE- Clonidine 0.1 mg twice a day START- Spirolactone 25 mg daily  If you need a refill on your cardiac medications before your next appointment, please call your pharmacy.  Labwork: BMP 7 days HERE IN OUR OFFICE AT [facility]  Take the provided lab slips with you to the lab for your blood draw.   You will NOT need to fast   Testing/Procedures: None Ordered   Follow-Up: Your physician wants you to follow-up in: 1 Month.     Thank you for choosing [facility] at [facility]!!

## 2017-08-05 DIAGNOSIS — I10 Essential (primary) hypertension: Secondary | ICD-10-CM | POA: Diagnosis not present

## 2017-08-06 LAB — BASIC METABOLIC PANEL
BUN/Creatinine Ratio: 13 (ref 12–28)
BUN: 16 mg/dL (ref 8–27)
CALCIUM: 9.2 mg/dL (ref 8.7–10.3)
CO2: 27 mmol/L (ref 20–29)
CREATININE: 1.21 mg/dL — AB (ref 0.57–1.00)
Chloride: 101 mmol/L (ref 96–106)
GFR calc Af Amer: 54 mL/min/{1.73_m2} — ABNORMAL LOW (ref >59)
GFR calc non Af Amer: 47 mL/min/{1.73_m2} — ABNORMAL LOW (ref >59)
Glucose: 86 mg/dL (ref 65–99)
Potassium: 4.1 mmol/L (ref 3.5–5.2)
Sodium: 140 mmol/L (ref 134–144)

## 2017-08-17 ENCOUNTER — Encounter: Payer: Self-pay | Admitting: Internal Medicine

## 2017-09-05 NOTE — Progress Notes (Signed)
Cardiology Office Note   Date:  09/06/2017   ID:  [name], DOB 08/08/1950, MRN [id number]  PCP:  Madelin Headings, MD  Cardiologist:   Rollene Rotunda, MD   Chief Complaint  Patient presents with  . Hypertension      History of Present Illness: [name] is a 67 y.o. female who presents for for evaluation of difficult to control hypertension.  at the last visit I reduced her clonidine and started spironolactone.   However, it seems that she did not really understand that she was supposed to reduce the clonidine to twice daily.  She is still been taking it 3 times daily.  She said there was a period where she changed her diet dramatically and her blood pressure went way down.  Eating out and having salt again and her blood pressure went up she has also had problems with her joints feeling brittle and aching muscles.  Right now she is having some stomach upset over the last few days.  He is elevated but she says today is very unusual and she rushed to get here.    Past Medical History:  Diagnosis Date  . Arthritis   . Chicken pox   . Depression   . Diverticulitis   . Elevated urinary free cortisol level 03/25/2014   mild with low potasssium  endo consult   disc with patient    . Hypertension   . Sciatica   . Seasonal allergies   . Stroke (HCC) 09/2008  . Thyroid disease     Past Surgical History:  Procedure Laterality Date  . MINOR BREAST BIOPSY  1979  . WISDOM TOOTH EXTRACTION       Current Outpatient Medications  Medication Sig Dispense Refill  . Ascorbic Acid (VITAMIN C) 1000 MG tablet Take 1,000 mg by mouth every morning.     . ASHWAGANDHA PO Take by mouth.    . B Complex-C (B-COMPLEX WITH VITAMIN C) tablet Take 1 tablet by mouth daily.      . Cholecalciferol (VITAMIN D PO) Take 1,000 Units by mouth daily.    . cloNIDine (CATAPRES) 0.1 MG tablet Take 0.1 mg by mouth 2 (two) times daily.     . hydrALAZINE (APRESOLINE) 50 MG tablet Take 1 tablet  (50 mg total) by mouth 3 (three) times daily. 270 tablet 3  . LevOCARNitine (L-CARNITINE PO) Take 800 mg by mouth every morning.     . Magnesium Hydroxide (MAGNESIA PO) Take 200 mg by mouth daily.    . MULTIPLE VITAMIN PO Take 1 tablet by mouth daily.     Marland Kitchen NATTOKINASE PO Take 1 tablet by mouth every morning. Helps blood flow    . Nebivolol HCl (BYSTOLIC) 20 MG TABS Take 1 tablet by mouth every morning.    . NON FORMULARY Barley Green:  Takes 1 tsp daily    . NON FORMULARY Chlorella:  Takes 15 capsules daily    . Probiotic Product (PROBIOTIC PO) Take 1 tablet by mouth daily. Bifido Beadlet 50+    . PROTEIN PO Take 1 tablet by mouth daily.    Marland Kitchen spironolactone (ALDACTONE) 50 MG tablet Take 1 tablet (50 mg total) by mouth daily. 90 tablet 3  . SPIRULINA PO Take 1 teaspoon by mouth daily     No current facility-administered medications for this visit.     Allergies:   Isovue [iopamidol]; Penicillins; and Sulfa antibiotics    ROS:  Please see the history of present illness.  Otherwise, review of systems are positive for none.   All other systems are reviewed and negative.    PHYSICAL EXAM: VS:  BP (!) 193/103 (BP Location: Right Arm)   Pulse 69   Ht 5\' 4"  (1.626 m)   Wt 188 lb (85.3 kg)   BMI 32.27 kg/m  , BMI Body mass index is 32.27 kg/m.  GENERAL:  Well appearing NECK:  No jugular venous distention, waveform within normal limits, carotid upstroke brisk and symmetric, no bruits, no thyromegaly LUNGS:  Clear to auscultation bilaterally CHEST:  Unremarkable HEART:  PMI not displaced or sustained,S1 and S2 within normal limits, no S3, no S4, no clicks, no rubs, no murmurs ABD:  Flat, positive bowel sounds normal in frequency in pitch, no bruits, no rebound, no guarding, no midline pulsatile mass, no hepatomegaly, no splenomegaly EXT:  2 plus pulses throughout, no edema, no cyanosis no clubbing   EKG:  EKG is not ordered today.   Recent Labs: 09/28/2016: TSH 3.655 09/29/2016: ALT  16; Hemoglobin 13.9; Platelets 181 08/05/2017: BUN 16; Creatinine, Ser 1.21; Potassium 4.1; Sodium 140    Lipid Panel    Component Value Date/Time   CHOL 174 09/29/2016 0419   TRIG 96 09/29/2016 0419   HDL 67 09/29/2016 0419   CHOLHDL 2.6 09/29/2016 0419   VLDL 19 09/29/2016 0419   LDLCALC 88 09/29/2016 0419      Wt Readings from Last 3 Encounters:  09/06/17 188 lb (85.3 kg)  07/28/17 190 lb (86.2 kg)  05/17/17 191 lb 1.6 oz (86.7 kg)      Other studies Reviewed: Additional studies/ records that were reviewed today include:  None  ASSESSMENT AND PLAN:  HTN:    Try to get her off the clonidine.  I gave instructions again to go to twice daily.  Going to increase the spironolactone to 50 mg daily.  She is going to do a very low salt diet.  She will need a basic metabolic profile.   PVCs:    She is not feeling these.  No change in therapy.   LBBB:  This is chronic.  No change.    FATIGUE:  She has this and snoring.  I will order a sleep study.     Current medicines are reviewed at length with the patient today.  The patient does not have concerns regarding medicines.  The following changes have been made:  As above.    Labs/ tests ordered today include:       Disposition:   FU with me in one month    Signed, Rollene RotundaJames Letcher Schweikert, MD  09/06/2017 3:05 PM     [facility]

## 2017-09-06 ENCOUNTER — Encounter: Payer: Self-pay | Admitting: Cardiology

## 2017-09-06 ENCOUNTER — Ambulatory Visit (INDEPENDENT_AMBULATORY_CARE_PROVIDER_SITE_OTHER): Payer: Medicare Other | Admitting: Cardiology

## 2017-09-06 VITALS — BP 193/103 | HR 69 | Ht 64.0 in | Wt 188.0 lb

## 2017-09-06 DIAGNOSIS — R5383 Other fatigue: Secondary | ICD-10-CM | POA: Diagnosis not present

## 2017-09-06 DIAGNOSIS — Z79899 Other long term (current) drug therapy: Secondary | ICD-10-CM | POA: Diagnosis not present

## 2017-09-06 DIAGNOSIS — I10 Essential (primary) hypertension: Secondary | ICD-10-CM | POA: Diagnosis not present

## 2017-09-06 MED ORDER — SPIRONOLACTONE 50 MG PO TABS: 50.0000 mg | ORAL_TABLET | Freq: Every day | ORAL | 3 refills | Status: DC

## 2017-09-06 NOTE — Patient Instructions (Signed)
Medication Instructions:  INCREASE- Spironolactone 50 mg daily  If you need a refill on your cardiac medications before your next appointment, please call your pharmacy.  Labwork: BMP in 10 days HERE IN OUR OFFICE AT [facility]  Take the provided lab slips with you to the lab for your blood draw.    You will NOT need to fast   Testing/Procedures: None Ordered   Follow-Up: Your physician wants you to follow-up in: 1 Month.      Thank you for choosing [facility] at [facility]!!

## 2017-09-26 ENCOUNTER — Other Ambulatory Visit: Payer: Medicare Other

## 2017-09-26 DIAGNOSIS — Z79899 Other long term (current) drug therapy: Secondary | ICD-10-CM | POA: Diagnosis not present

## 2017-09-27 LAB — BASIC METABOLIC PANEL
BUN/Creatinine Ratio: 14 (ref 12–28)
BUN: 17 mg/dL (ref 8–27)
CO2: 26 mmol/L (ref 20–29)
Calcium: 9 mg/dL (ref 8.7–10.3)
Chloride: 99 mmol/L (ref 96–106)
Creatinine, Ser: 1.23 mg/dL — ABNORMAL HIGH (ref 0.57–1.00)
GFR, EST AFRICAN AMERICAN: 53 mL/min/{1.73_m2} — AB (ref >59)
GFR, EST NON AFRICAN AMERICAN: 46 mL/min/{1.73_m2} — AB (ref >59)
Glucose: 85 mg/dL (ref 65–99)
POTASSIUM: 4.4 mmol/L (ref 3.5–5.2)
SODIUM: 142 mmol/L (ref 134–144)

## 2017-10-03 ENCOUNTER — Encounter: Payer: Self-pay | Admitting: *Deleted

## 2017-10-06 ENCOUNTER — Telehealth: Payer: Self-pay | Admitting: Cardiology

## 2017-10-06 NOTE — Telephone Encounter (Signed)
New Message ° ° ° °Patient is returning call in reference to lab results. Please call.  °

## 2017-10-07 NOTE — Telephone Encounter (Signed)
Letter with result mailed to pt 

## 2017-10-10 ENCOUNTER — Telehealth: Payer: Self-pay | Admitting: Cardiology

## 2017-10-10 NOTE — Telephone Encounter (Signed)
Follow Up:     Returning calls from last week,she thinks it might been about her lab results.

## 2017-10-10 NOTE — Telephone Encounter (Signed)
Left message for pt to call back  °

## 2017-10-10 NOTE — Telephone Encounter (Signed)
Pt c/o medication issue:  1. Name of Medication: Spironolactone ,Hydralazine and Clonidine 2. How are you currently taking this medication (dosage and times per day)?  I time a day  3. Are you having a reaction (difficulty breathing--STAT)? Sometimes on exertion  4. What is your medication issue?Pressure in her chest,  Blurred vision, mouth very dry and eyeshives and bruises on her right leg,  a lot of gas after she takes the medicine and very tired*

## 2017-10-10 NOTE — Telephone Encounter (Signed)
Parke PoissonWade, Alisha N, RN at 10/10/2017 11:20 AM   Status: Signed    Left message for pt to call back.

## 2017-10-12 NOTE — Telephone Encounter (Signed)
Left message to call back  

## 2017-10-14 NOTE — Telephone Encounter (Signed)
Patient called in regarding her lab work. She would like to know if she needs to see her Kidney doctor again, I advised that she had an appointment with Dr.Hochrein next week, 10/20/17 and she could discuss with him her lab questions from last visit.  Patient then advised that when she takes her medications: Hydralazine, Clonidine, and Spironolactone she breaks out in these hive looking bumps that itch. They go away, but she notices them when she takes her medications. Patient stated no changes were made recently in medications other than increase in Spironolactone at last visit. Which patient states has made her feel very dry, even with increase in drinking fluids.   Patient was advised to take a benadryl to see if it helped and that I would send a message to Dr.Hochrein.

## 2017-10-14 NOTE — Telephone Encounter (Signed)
See previous message sent to Dr.Hochrein regarding this issue in a previous note.

## 2017-10-16 NOTE — Telephone Encounter (Signed)
Will discuss at appt this week.  Agree with Benadryl.

## 2017-10-18 NOTE — Telephone Encounter (Signed)
Called patient, LVM advising to keep appointment to discuss issues.   Left call back number if any questions or concerns.

## 2017-10-19 NOTE — Progress Notes (Signed)
Cardiology Office Note   Date:  10/20/2017   ID:  [name], DOB 02/27/1951, MRN [id number]  PCP:  Madelin HeadingsPanosh, Wanda K, MD  Cardiologist:   Rollene RotundaJames Dalton Mille, MD   Chief Complaint  Patient presents with  . Hypertension      History of Present Illness: [name] is a 67 y.o. female who presents for for evaluation of difficult to control hypertension.  at a previous visit  I reduced her clonidine and started spironolactone.   She called recently complaining of a rash she thought related to her meds.   She thought this might be a cumulative effect of her hydralazine but it did start after her spironolactone.  She has some raised red rash on her legs.  She denies any resting shortness of breath, PND orthopnea.  She is not having any palpitations, presyncope or syncope.  She does think that the Spironolactone has really caused dry mouth and dry eyes.   Past Medical History:  Diagnosis Date  . Arthritis   . Chicken pox   . Depression   . Diverticulitis   . Elevated urinary free cortisol level 03/25/2014   mild with low potasssium  endo consult   disc with patient    . Hypertension   . Sciatica   . Seasonal allergies   . Stroke (HCC) 09/2008  . Thyroid disease     Past Surgical History:  Procedure Laterality Date  . MINOR BREAST BIOPSY  1979  . WISDOM TOOTH EXTRACTION       Current Outpatient Medications  Medication Sig Dispense Refill  . Ascorbic Acid (VITAMIN C) 1000 MG tablet Take 1,000 mg by mouth every morning.     . ASHWAGANDHA PO Take by mouth.    . B Complex-C (B-COMPLEX WITH VITAMIN C) tablet Take 1 tablet by mouth daily.      . Cholecalciferol (VITAMIN D PO) Take 1,000 Units by mouth daily.    . cloNIDine (CATAPRES) 0.1 MG tablet Take 0.1 mg by mouth 2 (two) times daily.     . hydrALAZINE (APRESOLINE) 50 MG tablet Take 1 tablet (50 mg total) by mouth 3 (three) times daily. 270 tablet 3  . LevOCARNitine (L-CARNITINE PO) Take by mouth every morning.      . Magnesium Hydroxide (MAGNESIA PO) Take 200 mg by mouth daily.    . MULTIPLE VITAMIN PO Take 1 tablet by mouth daily.     Marland Kitchen. NATTOKINASE PO Take 1 tablet by mouth every morning. Helps blood flow    . Nebivolol HCl (BYSTOLIC) 20 MG TABS Take 1 tablet by mouth every morning.    . NON FORMULARY Barley Green:  Takes 1 tsp daily    . NON FORMULARY Chlorella:  Takes 15 capsules daily    . Probiotic Product (PROBIOTIC PO) Take 1 tablet by mouth daily. Bifido Beadlet 50+    . PROTEIN PO Take 1 tablet by mouth daily.    Marland Kitchen. spironolactone (ALDACTONE) 50 MG tablet Take 1 tablet (50 mg total) by mouth daily. 90 tablet 3  . SPIRULINA PO Take 1 teaspoon by mouth daily     No current facility-administered medications for this visit.     Allergies:   Isovue [iopamidol]; Penicillins; and Sulfa antibiotics    ROS:  Please see the history of present illness.   Otherwise, review of systems are positive for none.   All other systems are reviewed and negative.    PHYSICAL EXAM: VS:  BP (!) 144/88   Pulse  64   Ht 5\' 3"  (1.6 m)   Wt 184 lb 3.2 oz (83.6 kg)   BMI 32.63 kg/m  , BMI Body mass index is 32.63 kg/m.  GENERAL:  Well appearing NECK:  No jugular venous distention, waveform within normal limits, carotid upstroke brisk and symmetric, no bruits, no thyromegaly LUNGS:  Clear to auscultation bilaterally CHEST:  Unremarkable HEART:  PMI not displaced or sustained,S1 and S2 within normal limits, no S3, no S4, no clicks, no rubs, no murmurs ABD:  Flat, positive bowel sounds normal in frequency in pitch, no bruits, no rebound, no guarding, no midline pulsatile mass, no hepatomegaly, no splenomegaly EXT:  2 plus pulses throughout, no edema, no cyanosis no clubbing   EKG:  EKG is not ordered today.   Recent Labs: 09/26/2017: BUN 17; Creatinine, Ser 1.23; Potassium 4.4; Sodium 142    Lipid Panel    Component Value Date/Time   CHOL 174 09/29/2016 0419   TRIG 96 09/29/2016 0419   HDL 67  09/29/2016 0419   CHOLHDL 2.6 09/29/2016 0419   VLDL 19 09/29/2016 0419   LDLCALC 88 09/29/2016 0419      Wt Readings from Last 3 Encounters:  10/20/17 184 lb 3.2 oz (83.6 kg)  09/06/17 188 lb (85.3 kg)  07/28/17 190 lb (86.2 kg)      Other studies Reviewed: Additional studies/ records that were reviewed today include:   None  ASSESSMENT AND PLAN:  HTN:    She is having a rash that could be related to the Spironolactone.  She can reduce it in half and see if this gets any better.  If not she will stop it altogether.  However, she is going to need a close eye on her blood pressure.  Her pressure at least is better controlled now but might go up as we stopped the Spironolactone.  However, she is going to double down on exercise and low-salt and weight loss efforts to see if we can control the pressure with fewer medicines but with lifestyle changes.  She will let me know of her blood pressure diary results.   PVCs:   She is not feeling these.  No change in therapy.   LBBB:  No further testing.   FATIGUE:   She cancelled a sleep consult with Dr. Tresa Endo.     Current medicines are reviewed at length with the patient today.  The patient does not have concerns regarding medicines.  The following changes have been made:  None  Labs/ tests ordered today include:   none    Disposition:   FU with me in 6 months    Signed, Rollene Rotunda, MD  10/20/2017 3:33 PM    [facility]

## 2017-10-20 ENCOUNTER — Ambulatory Visit (INDEPENDENT_AMBULATORY_CARE_PROVIDER_SITE_OTHER): Payer: Medicare Other | Admitting: Cardiology

## 2017-10-20 ENCOUNTER — Encounter: Payer: Self-pay | Admitting: Cardiology

## 2017-10-20 VITALS — BP 144/88 | HR 64 | Ht 63.0 in | Wt 184.2 lb

## 2017-10-20 DIAGNOSIS — I493 Ventricular premature depolarization: Secondary | ICD-10-CM

## 2017-10-20 DIAGNOSIS — I10 Essential (primary) hypertension: Secondary | ICD-10-CM | POA: Diagnosis not present

## 2017-10-20 NOTE — Patient Instructions (Addendum)
Medication changes  Reduce Spirolactone to 1/2 tablet of 50 mg Daily  For 1 week, if rash does not get better stop medication all together.  Continue blood pressure reading.      Your physician wants you to follow-up in 6 month with Dr Antoine PocheHOCHREIN. You will receive a reminder letter in the mail two months in advance. If you don't receive a letter, please call our office to schedule the follow-up appointment.    If you need a refill on your cardiac medications before your next appointment, please call your pharmacy.

## 2017-11-22 ENCOUNTER — Telehealth: Payer: Self-pay | Admitting: Cardiology

## 2017-11-22 MED ORDER — NEBIVOLOL HCL 20 MG PO TABS: 1.0000 | ORAL_TABLET | ORAL | 1 refills | Status: DC

## 2017-11-22 NOTE — Telephone Encounter (Signed)
New Message        Patient called to let Dr. [place] Lions that she has stop taking the spironolactone (ALDACTONE) 50 MG tablet due itching and having breakouts.      *STAT* If patient is at the pharmacy, call can be transferred to refill team.   1. Which medications need to be refilled? (please list name of each medication and dose if known) Bystolic  2. Which pharmacy/location (including street and city if local pharmacy) is medication to be sent to[facility]  3. Do they need a 30 day or 90 day supply? 90

## 2017-11-22 NOTE — Telephone Encounter (Signed)
Patient wanted to make Dr.Hochrein aware that she has stopped her Spironolactone medication, per last OV that was okay if rash did not go away.  Medication sent to pharmacy.

## 2017-12-29 NOTE — Progress Notes (Signed)
Chief Complaint  Patient presents with  . Follow-up    Discuss kidneys // discuss medications    HPI: [name] 67 y.o. come in for  Multiple concerns  She has resistant ht and has been folowed by ht clininc   Dr Bridger Lions in  August and had renal evaluatiopn  Today  GI Stomach has crazy noises .  Thinks it may be related to the medicine because of timing and usually gets better when she eats something no specific constipation but has had change in bowel habits.  Has been seeing a nutritionist there is been helpful to her and asking for specific blood work.  She is also been placed her taking magnesium supplements with her calcium.  She does not think her GI symptoms are related to that because they predated taking this medicine supplement.[name]?    BP has been  148/88  Usually .  ocass 113/70 after taking med   Smoothie  And then cracker    With medicine .    worses with hydralazine.  And clonidine .  Thinks having  Se of med . gurgly  Eating some  Effects    . See below when place on spironolactone   And gae her dry mouth skin and feel bad .   Has /s   HTN:    She is having a rash that could be related to the Spironolactone.  She can reduce it in half and see if this gets any better.  If not she will stop it altogether.  However, she is going to need a close eye on her blood pressure.  Her pressure at least is better controlled now but might go up as we stopped the Spironolactone.  However, she is going to double down on exercise and low-salt and weight loss efforts to see if we can control the pressure with fewer medicines but with lifestyle changes.  She will let me know of her blood pressure diary results.   PVCs:   She is not feeling these.  No change in therapy.   LBBB:  No further testing.   FATIGUE:   She cancelled a sleep consult with Dr. Tresa Endo.     Current medicines are reviewed at length with the patient today.  The patient does not have concerns  regarding medicines.  The following changes have been made:  None  Labs/ tests ordered today include:   none    Disposition:   FU with me in 6 months  ROS: See pertinent positives and negatives per HPI.  Past Medical History:  Diagnosis Date  . Arthritis   . Chicken pox   . Depression   . Diverticulitis   . Elevated urinary free cortisol level 03/25/2014   mild with low potasssium  endo consult   disc with patient    . Hypertension   . Sciatica   . Seasonal allergies   . Stroke (HCC) 09/2008  . Thyroid disease     Family History  Problem Relation Age of Onset  . Heart failure Mother   . Heart disease Mother   . Diabetes Mother   . Hypertension Mother   . Hypertension Father   . Diabetes Maternal Grandmother   . Hypertension Sister   . Hypertension Brother   . Cancer Brother        Prostate  . Hypertension Sister     Social History   Socioeconomic History  . Marital status: Widowed    Spouse name: Not  on file  . Number of children: Not on file  . Years of education: Not on file  . Highest education level: Not on file  Occupational History  . Not on file  Social Needs  . Financial resource strain: Not on file  . Food insecurity:    Worry: Not on file    Inability: Not on file  . Transportation needs:    Medical: Not on file    Non-medical: Not on file  Tobacco Use  . Smoking status: Never Smoker  . Smokeless tobacco: Never Used  Substance and Sexual Activity  . Alcohol use: Yes    Comment: Social Use only  . Drug use: No  . Sexual activity: Not Currently  Lifestyle  . Physical activity:    Days per week: Not on file    Minutes per session: Not on file  . Stress: Not on file  Relationships  . Social connections:    Talks on phone: Not on file    Gets together: Not on file    Attends religious service: Not on file    Active member of club or organization: Not on file    Attends meetings of clubs or organizations: Not on file     Relationship status: Not on file  Other Topics Concern  . 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 Ascorbic Acid (VITAMIN C) 1000 MG tablet Take 1,000 mg by mouth every morning.     . ASHWAGANDHA PO Take by mouth.    . B Complex-C (B-COMPLEX WITH VITAMIN C) tablet Take 1 tablet by mouth daily.      . Cholecalciferol (VITAMIN D PO) Take 1,000 Units by mouth daily.    . cloNIDine (CATAPRES) 0.1 MG tablet Take 0.1 mg by mouth 2 (two) times daily.     . hydrALAZINE (APRESOLINE) 50 MG tablet Take 1 tablet (50 mg total) by mouth 3 (three) times daily. 270 tablet 3  . LevOCARNitine (L-CARNITINE PO) Take by mouth every morning.     . Magnesium Hydroxide (MAGNESIA PO) Take 200 mg by mouth daily.    . MULTIPLE VITAMIN PO Take 1 tablet by mouth daily.     Marland Kitchen NATTOKINASE PO Take 1 tablet by mouth every morning. Helps blood flow    . Nebivolol HCl (BYSTOLIC) 20 MG TABS Take 1 tablet (20 mg total) by mouth every morning. 90 tablet 1  . NON FORMULARY Barley Green:  Takes 1 tsp daily    . NON FORMULARY Chlorella:  Takes 15 capsules daily    . Probiotic Product (PROBIOTIC PO) Take 1 tablet by mouth daily. Bifido Beadlet 50+    . SPIRULINA PO Take 1 teaspoon by mouth daily    . PROTEIN PO Take 1 tablet by mouth daily.    Marland Kitchen spironolactone (ALDACTONE) 50 MG tablet Take 1 tablet (50 mg total) by mouth daily. 90 tablet 3   No facility-administered medications prior to visit.      EXAM:  BP (!) 160/98  Pulse 62   Temp 98.3 F (36.8 C) (Oral)   Wt 184 lb 3.2 oz (83.6 kg)   BMI 32.63 kg/m   Body mass index is 32.63  kg/m.  GENERAL: vitals reviewed and listed above, alert, oriented, appears well hydrated and in no acute distress HEENT: atraumatic, conjunctiva  clear, no obvious abnormalities on inspection of external nose and ears NECK: no obvious masses on inspection palpation  LUNGS: clear to auscultation bilaterally, no wheezes, rales or rhonchi, good air movement CV: HRRR, no clubbing cyanosis or  peripheral edema nl cap refill  Abdomen:  Sof,t normal bowel sounds without hepatosplenomegaly, no guarding rebound or masses no CVA tenderness MS: moves all extremities without noticeable focal  abnormality PSYCH: pleasant and cooperative, no obvious depression or anxiety Lab Results  Component Value Date   WBC 8.6 09/29/2016   HGB 13.9 09/29/2016   HCT 42.0 09/29/2016   PLT 181 09/29/2016   GLUCOSE 85 09/26/2017   CHOL 174 09/29/2016   TRIG 96 09/29/2016   HDL 67 09/29/2016   LDLCALC 88 09/29/2016   ALT 16 09/29/2016   AST 24 09/29/2016   NA 142 09/26/2017   K 4.4 09/26/2017   CL 99 09/26/2017   CREATININE 1.23 (H) 09/26/2017   BUN 17 09/26/2017   CO2 26 09/26/2017   TSH 3.655 09/28/2016   INR 0.99 09/28/2016   HGBA1C 5.3 09/29/2016   BP Readings from Last 3 Encounters:  12/30/17 (!) 160/98  10/20/17 (!) 144/88  09/06/17 (!) 193/103    ASSESSMENT AND PLAN:  Discussed the following assessment and plan:  Hypertensive heart disease without heart failure - Plan: Basic metabolic panel, CBC with Differential/Platelet, Hepatic function panel, TSH, T4, free, T3, free, Thyroid antibodies, Insulin, random, Magnesium, Homocysteine, Lipid panel  Autoimmune thyroiditis - Plan: Basic metabolic panel, CBC with Differential/Platelet, Hepatic function panel, TSH, T4, free, T3, free, Thyroid antibodies, Insulin, random, Magnesium, Homocysteine, Lipid panel  Excessive gas - diet  and poss med but no alarm findings  - Plan: Basic metabolic panel, CBC with Differential/Platelet, Hepatic function panel,  TSH, T4, free, T3, free, Thyroid antibodies, Insulin, random, Magnesium, Homocysteine, Lipid panel  Essential hypertension - Plan: Basic metabolic panel, CBC with Differential/Platelet, Hepatic function panel, TSH, T4, free, T3, free, Thyroid antibodies, Insulin, random, Magnesium, Homocysteine, Lipid panel  Medication management - Plan: Basic metabolic panel, CBC with Differential/Platelet, Hepatic function panel, TSH, T4, free, T3, free, Thyroid antibodies, Insulin, random, Magnesium, Homocysteine, Lipid panel  Elevated TSH - Plan: Basic metabolic panel, CBC with Differential/Platelet, Hepatic function panel, TSH, T4, free, T3, free, Thyroid antibodies, Insulin, random, Magnesium, Homocysteine, Lipid panel  Left bundle branch block (LBBB) on electrocardiogram - Plan: Basic metabolic panel, CBC with Differential/Platelet, Hepatic function panel, TSH, T4, free, T3, free, Thyroid antibodies, Insulin, random, Magnesium, Homocysteine, Lipid panel  GI symptoms - Plan: Basic metabolic panel, CBC with Differential/Platelet, Hepatic function panel, TSH, T4, free, T3, free, Thyroid antibodies, Insulin, random, Magnesium, Homocysteine, Lipid panel  Resistant hypertension  Mild renal insufficiency - apparen prn  basis fu  dr Joaquim Lai  Disc colon cancer screening and still declines at this time  ( cologuard candidate)  Will do  Labs  As requested x  No rbc mag  At this time .   had thought that she had had a full evaluation for renovascular disease but cannot find this initially in the chart today.  Still concerned that her blood pressure is not better.  Seem to be very high when she  first came in and is coming down.  She is apparently compliant with the medicine. Plan fasting lab work as discussed and follow-up after that.  As appropriate. We will also get information to Dr. Antoine Poche. She does not declines mammogram flu shot and at this point: Screening average risk. -Patient advised to return or notify  health care team  if  new concerns arise. And plan after lab results as appropriate . Pt wasn't copy of labs    To continue with nutrition  Did advise mag can cause gi sx. Total visit > 50% spent counseling and coordinating care as indicated in above note and in instructions to patient .   Patient Instructions  Get appt   For fasting lab tests.  As discussed .  And will let you know when back   Agree need to   Recheck labs for kidney function  You did have  Evaluation in past for   Renal artery stenosis  But cant find in chart so will investigate if appropriate to repeat   Renal artery studies .   Check Bp  To make sure controlled .       ADVISE   Colon cancer screening .   Cologuard.   May be a good choice .       Neta Mends. Leatrice Parilla M.D.

## 2017-12-30 ENCOUNTER — Ambulatory Visit (INDEPENDENT_AMBULATORY_CARE_PROVIDER_SITE_OTHER): Payer: Medicare Other | Admitting: Internal Medicine

## 2017-12-30 ENCOUNTER — Encounter: Payer: Self-pay | Admitting: Internal Medicine

## 2017-12-30 VITALS — BP 160/98 | HR 62 | Temp 98.3°F | Wt 184.2 lb

## 2017-12-30 DIAGNOSIS — R143 Flatulence: Secondary | ICD-10-CM

## 2017-12-30 DIAGNOSIS — R7989 Other specified abnormal findings of blood chemistry: Secondary | ICD-10-CM

## 2017-12-30 DIAGNOSIS — I447 Left bundle-branch block, unspecified: Secondary | ICD-10-CM | POA: Diagnosis not present

## 2017-12-30 DIAGNOSIS — N289 Disorder of kidney and ureter, unspecified: Secondary | ICD-10-CM | POA: Diagnosis not present

## 2017-12-30 DIAGNOSIS — Z79899 Other long term (current) drug therapy: Secondary | ICD-10-CM | POA: Diagnosis not present

## 2017-12-30 DIAGNOSIS — E063 Autoimmune thyroiditis: Secondary | ICD-10-CM

## 2017-12-30 DIAGNOSIS — R198 Other specified symptoms and signs involving the digestive system and abdomen: Secondary | ICD-10-CM | POA: Diagnosis not present

## 2017-12-30 DIAGNOSIS — I10 Essential (primary) hypertension: Secondary | ICD-10-CM

## 2017-12-30 DIAGNOSIS — I119 Hypertensive heart disease without heart failure: Secondary | ICD-10-CM | POA: Diagnosis not present

## 2017-12-30 NOTE — Patient Instructions (Addendum)
Get appt   For fasting lab tests.  As discussed .  And will let you know when back   Agree need to   Recheck labs for kidney function  You did have  Evaluation in past for   Renal artery stenosis  But cant find in chart so will investigate if appropriate to repeat   Renal artery studies .   Check Bp  To make sure controlled .       ADVISE   Colon cancer screening .   Cologuard.   May be a good choice .

## 2018-01-02 ENCOUNTER — Other Ambulatory Visit: Payer: Medicare Other

## 2018-01-02 ENCOUNTER — Other Ambulatory Visit (INDEPENDENT_AMBULATORY_CARE_PROVIDER_SITE_OTHER): Payer: Medicare Other

## 2018-01-02 DIAGNOSIS — I119 Hypertensive heart disease without heart failure: Secondary | ICD-10-CM | POA: Diagnosis not present

## 2018-01-02 DIAGNOSIS — R7989 Other specified abnormal findings of blood chemistry: Secondary | ICD-10-CM | POA: Diagnosis not present

## 2018-01-02 DIAGNOSIS — E063 Autoimmune thyroiditis: Secondary | ICD-10-CM | POA: Diagnosis not present

## 2018-01-02 DIAGNOSIS — R198 Other specified symptoms and signs involving the digestive system and abdomen: Secondary | ICD-10-CM | POA: Diagnosis not present

## 2018-01-02 DIAGNOSIS — R143 Flatulence: Secondary | ICD-10-CM | POA: Diagnosis not present

## 2018-01-02 DIAGNOSIS — I447 Left bundle-branch block, unspecified: Secondary | ICD-10-CM | POA: Diagnosis not present

## 2018-01-02 DIAGNOSIS — I10 Essential (primary) hypertension: Secondary | ICD-10-CM | POA: Diagnosis not present

## 2018-01-02 DIAGNOSIS — Z79899 Other long term (current) drug therapy: Secondary | ICD-10-CM | POA: Diagnosis not present

## 2018-01-02 LAB — HEPATIC FUNCTION PANEL
ALT: 19 U/L (ref 0–35)
AST: 22 U/L (ref 0–37)
Albumin: 4.3 g/dL (ref 3.5–5.2)
Alkaline Phosphatase: 58 U/L (ref 39–117)
BILIRUBIN DIRECT: 0.2 mg/dL (ref 0.0–0.3)
BILIRUBIN TOTAL: 1.3 mg/dL — AB (ref 0.2–1.2)
TOTAL PROTEIN: 7 g/dL (ref 6.0–8.3)

## 2018-01-02 LAB — CBC WITH DIFFERENTIAL/PLATELET
BASOS ABS: 0 10*3/uL (ref 0.0–0.1)
Basophils Relative: 0.7 % (ref 0.0–3.0)
Eosinophils Absolute: 0.1 10*3/uL (ref 0.0–0.7)
Eosinophils Relative: 2.6 % (ref 0.0–5.0)
HEMATOCRIT: 44.2 % (ref 36.0–46.0)
HEMOGLOBIN: 15 g/dL (ref 12.0–15.0)
LYMPHS PCT: 28.4 % (ref 12.0–46.0)
Lymphs Abs: 1.6 10*3/uL (ref 0.7–4.0)
MCHC: 33.9 g/dL (ref 30.0–36.0)
MCV: 93.1 fl (ref 78.0–100.0)
MONOS PCT: 9.6 % (ref 3.0–12.0)
Monocytes Absolute: 0.5 10*3/uL (ref 0.1–1.0)
NEUTROS ABS: 3.3 10*3/uL (ref 1.4–7.7)
Neutrophils Relative %: 58.7 % (ref 43.0–77.0)
PLATELETS: 189 10*3/uL (ref 150.0–400.0)
RBC: 4.74 Mil/uL (ref 3.87–5.11)
RDW: 13.3 % (ref 11.5–15.5)
WBC: 5.6 10*3/uL (ref 4.0–10.5)

## 2018-01-02 LAB — LIPID PANEL
CHOLESTEROL: 168 mg/dL (ref 0–200)
HDL: 63 mg/dL (ref >39.00)
LDL CALC: 88 mg/dL (ref 0–99)
NonHDL: 104.69
TRIGLYCERIDES: 82 mg/dL (ref 0.0–149.0)
Total CHOL/HDL Ratio: 3
VLDL: 16.4 mg/dL (ref 0.0–40.0)

## 2018-01-02 LAB — BASIC METABOLIC PANEL
BUN: 17 mg/dL (ref 6–23)
CHLORIDE: 102 meq/L (ref 96–112)
CO2: 31 mEq/L (ref 19–32)
Calcium: 9.4 mg/dL (ref 8.4–10.5)
Creatinine, Ser: 1.07 mg/dL (ref 0.40–1.20)
GFR: 65.86 mL/min (ref >60.00)
GLUCOSE: 89 mg/dL (ref 70–99)
POTASSIUM: 3.7 meq/L (ref 3.5–5.1)
SODIUM: 140 meq/L (ref 135–145)

## 2018-01-02 LAB — MAGNESIUM: Magnesium: 2.3 mg/dL (ref 1.5–2.5)

## 2018-01-02 LAB — T4, FREE: Free T4: 1.02 ng/dL (ref 0.60–1.60)

## 2018-01-02 LAB — T3, FREE: T3, Free: 3.3 pg/mL (ref 2.3–4.2)

## 2018-01-02 LAB — TSH: TSH: 4.12 u[IU]/mL (ref 0.35–4.50)

## 2018-01-05 LAB — THYROID ANTIBODIES
Thyroglobulin Ab: <1 IU/mL (ref <=1)
Thyroperoxidase Ab SerPl-aCnc: 413 IU/mL — ABNORMAL HIGH (ref <9)

## 2018-01-05 LAB — INSULIN, RANDOM: INSULIN: 12.7 u[IU]/mL (ref 2.0–19.6)

## 2018-01-05 LAB — HOMOCYSTEINE: Homocysteine: 10 umol/L (ref <10.4)

## 2018-01-27 ENCOUNTER — Telehealth: Payer: Self-pay | Admitting: *Deleted

## 2018-01-27 NOTE — Telephone Encounter (Signed)
Copied from CRM [phone]. Topic: General - Inquiry >> Jan 27, 2018  3:14 PM Baldo DaubAlexander, Amber L wrote: Reason for CRM:   Pt calling to get lab results from visit in October.

## 2018-01-30 NOTE — Telephone Encounter (Signed)
LM x 1 

## 2018-01-30 NOTE — Telephone Encounter (Signed)
Notes recorded by Madelin HeadingsPanosh, Wanda K, MD on 01/11/2018 at 9:38 AM EDT Blood work all normal except you do have antibody positive for thyroid which means You have something called Thyroiditis which is a silent condition  But can eventually cause thyroid levels to be abnormal and Can be treated at that time. But dont think this is causing the BP issues .   can consider seeing endocrinologist  consider small dose of thyroid medication  or we can follow the thryoid  Functions every 6 months ( blood Work) .  ( It is possible that Minor thyroid problem can cause fatigue and achiness)  Your renal function is fortunately better than last check .    On review of your record  You did have a scan of your renal arteries and no Blockage Noted ( ie normal flow)    Plan follow up in 3-4 months  Or  If want to discuss further.   Let us know if you want to proceed with more thyroid  Endocrinology evaluation or medication trial....  Please send a copy Of these results to the patient for her records

## 2018-02-02 NOTE — Telephone Encounter (Signed)
See lab results.  

## 2018-02-14 NOTE — Telephone Encounter (Signed)
No CRM created to allow NT to give results.

## 2018-02-14 NOTE — Telephone Encounter (Signed)
Pt is calling back and would like her blood work results 

## 2018-02-16 NOTE — Telephone Encounter (Signed)
LM for call back CRM created [facility] - permission given to give results

## 2018-03-20 ENCOUNTER — Encounter: Payer: Self-pay | Admitting: *Deleted

## 2018-03-21 ENCOUNTER — Other Ambulatory Visit: Payer: Self-pay

## 2018-03-21 MED ORDER — CLONIDINE HCL 0.1 MG PO TABS: 0.1000 mg | ORAL_TABLET | Freq: Two times a day (BID) | ORAL | 1 refills | Status: DC

## 2018-06-12 ENCOUNTER — Other Ambulatory Visit: Payer: Self-pay | Admitting: Cardiology

## 2018-06-14 ENCOUNTER — Other Ambulatory Visit: Payer: Self-pay

## 2018-06-14 MED ORDER — CLONIDINE HCL 0.1 MG PO TABS: 0.1000 mg | ORAL_TABLET | Freq: Two times a day (BID) | ORAL | 3 refills | Status: DC

## 2018-06-14 MED ORDER — HYDRALAZINE HCL 50 MG PO TABS: 50.0000 mg | ORAL_TABLET | Freq: Three times a day (TID) | ORAL | 3 refills | Status: DC

## 2018-07-05 ENCOUNTER — Telehealth: Payer: Self-pay

## 2018-07-05 ENCOUNTER — Telehealth: Payer: Self-pay | Admitting: Cardiology

## 2018-07-05 NOTE — Telephone Encounter (Signed)
lmtcb -  Pt needs to schedule recall fu appt. Virtual now or wait until August./dc

## 2018-07-05 NOTE — Telephone Encounter (Signed)
lvm for pt to call back. Pt is due for follow up. Need to set up virtual visit. CRM created

## 2018-07-07 ENCOUNTER — Telehealth: Payer: Self-pay | Admitting: Cardiology

## 2018-07-07 NOTE — Telephone Encounter (Signed)
lmtcb - needs to schedule recall appt/dc

## 2018-07-12 NOTE — Progress Notes (Signed)
Virtual Visit via Telephone Note   This visit type was conducted due to national recommendations for restrictions regarding the COVID-19 Pandemic (e.g. social distancing) in an effort to limit this patient's exposure and mitigate transmission in our community.  Due to her co-morbid illnesses, this patient is at least at moderate risk for complications without adequate follow up.  This format is felt to be most appropriate for this patient at this time.  The patient did not have access to video technology/had technical difficulties with video requiring transitioning to audio format only (telephone).  All issues noted in this document were discussed and addressed.  No physical exam could be performed with this format.  Please refer to the patient's chart for her  consent to telehealth for [facility].   Evaluation Performed:  Follow-up visit  Date:  07/13/2018   ID:  [name], DOB Dec 21, 1950, MRN [id number]  Patient Location: Home Provider Location: Home  PCP:  Krystal Headings, MD  Cardiologist:  Rollene Rotunda, MD  Electrophysiologist:  None   Chief Complaint:  HTN  History of Present Illness:    Paticia Gordon is a 68 y.o. female with  for evaluation of difficult to control hypertension.   She was on spironolactone but had a rash and had to have this reduced.     She has been very sensitive to medications.  She has some strange reactions to hydralazine where she feels like it is making her urine smelling making her sweat smell.  She does however take it but she is only been taking it twice a day.  She says her blood pressures been much better controlled but she has not had any batteries for her machine for a couple of weeks it turns out.  She takes some over-the-counter therapies like beet juice.  She rarely has to use of clonidine by her report because her blood pressure is not running in the 190s like they were.  Again however she says she has not been taking the meds  routinely.  She feels well.  She denies any cardiovascular symptoms such as chest pressure, neck or arm discomfort.  She is had no palpitations, presyncope or syncope.  She has had no weight gain or edema.  The patient does not have symptoms concerning for COVID-19 infection (fever, chills, cough, or new shortness of breath).    Past Medical History:  Diagnosis Date  . Arthritis   . Chicken pox   . Depression   . Diverticulitis   . Elevated urinary free cortisol level 03/25/2014   mild with low potasssium  endo consult   disc with patient    . Hypertension   . Sciatica   . Seasonal allergies   . Stroke ([facility]) 09/2008  . Thyroid disease    Past Surgical History:  Procedure Laterality Date  . MINOR BREAST BIOPSY  1979  . WISDOM TOOTH EXTRACTION       Current Meds  Medication Sig  . Ascorbic Acid (VITAMIN C) 1000 MG tablet Take 1,000 mg by mouth every morning.   . ASHWAGANDHA PO Take by mouth.  . B Complex-C (B-COMPLEX WITH VITAMIN C) tablet Take 1 tablet by mouth daily.    Marland Kitchen BYSTOLIC 20 MG TABS TAKE 1 TABLET EVERY MORNING  . Cholecalciferol (VITAMIN D PO) Take 5,000 Units by mouth daily.   . cloNIDine (CATAPRES) 0.1 MG tablet Take 0.1 mg by mouth as needed.  . hydrALAZINE (APRESOLINE) 50 MG tablet Take 50 mg by mouth 2 (  two) times a day.  . LevOCARNitine (L-CARNITINE PO) Take by mouth every morning.   . Magnesium Hydroxide (MAGNESIA PO) Take 200 mg by mouth daily.  . MULTIPLE VITAMIN PO Take 1 tablet by mouth daily.   Marland Kitchen. NATTOKINASE PO Take 1 tablet by mouth every morning. Helps blood flow  . NON FORMULARY [place]:  Takes 1 tsp daily  . NON FORMULARY Chlorella:  Takes 15 capsules daily  . Probiotic Product (PROBIOTIC PO) Take 1 tablet by mouth daily. Bifido Beadlet 50+  . SPIRULINA PO Take 1 teaspoon by mouth daily  . [DISCONTINUED] cloNIDine (CATAPRES) 0.1 MG tablet Take 1 tablet (0.1 mg total) by mouth 2 (two) times daily. (Patient taking differently: Take 0.1 mg by mouth  as needed. )     Allergies:   Isovue [iopamidol]; Penicillins; and Sulfa antibiotics   Social History   Tobacco Use  . Smoking status: Never Smoker  . Smokeless tobacco: Never Used  Substance Use Topics  . Alcohol use: Yes    Comment: Social Use only  . Drug use: No     Family Hx: The patient's family history includes Cancer in her brother; Diabetes in her maternal grandmother and mother; Heart disease in her mother; Heart failure in her mother; Hypertension in her brother, father, mother, sister, and sister.  ROS:   Please see the history of present illness.    As stated in the HPI and negative for all other systems.   Prior CV studies:   The following studies were reviewed today:    Labs/Other Tests and Data Reviewed:    EKG:  No ECG reviewed.  Recent Labs: 01/02/2018: ALT 19; BUN 17; Creatinine, Ser 1.07; Hemoglobin 15.0; Magnesium 2.3; Platelets 189.0; Potassium 3.7; Sodium 140; TSH 4.12   Recent Lipid Panel Lab Results  Component Value Date/Time   CHOL 168 01/02/2018 09:49 AM   TRIG 82.0 01/02/2018 09:49 AM   HDL 63.00 01/02/2018 09:49 AM   CHOLHDL 3 01/02/2018 09:49 AM   LDLCALC 88 01/02/2018 09:49 AM    Wt Readings from Last 3 Encounters:  07/13/18 180 lb (81.6 kg)  12/30/17 184 lb 3.2 oz (83.6 kg)  10/20/17 184 lb 3.2 oz (83.6 kg)     Objective:    Vital Signs:  Ht 5\' 3"  (1.6 m)   Wt 180 lb (81.6 kg)   BMI 31.89 kg/m    VITAL SIGNS:  reviewed  ASSESSMENT & PLAN:    HTN:  She is encouraged to get batteries for her machine and keep her readings.  We will leave her on the meds as listed and she can take the hydralazine twice daily for now as long as we can track her blood pressures.  I think she is fine to use the as needed clonidine which seems to work well in which she is only needed a couple of times per week.  We discussed the need to continue a regimen to control her blood pressure which has been severely elevated in the past.  I hope she will  be compliant with this.  PVCs:    She is not really feeling these.  No change in therapy.   LBBB:   This has been chronic no change in therapy.  FATIGUE:    I have recorded that she canceled her sleep appointment with Dr. Tresa EndoKelly which we were doing because of her poorly controlled blood pressure, snoring and fatigue.  She does not remember this at all and wants to reschedule and  I will try to do this.  COVID-19 Education: The signs and symptoms of COVID-19 were discussed with the patient and how to seek care for testing (follow up with PCP or arrange E-visit).  The importance of social distancing was discussed today.  Time:   Today, I have spent 16 minutes with the patient with telehealth technology discussing the above problems.     Medication Adjustments/Labs and Tests Ordered: Current medicines are reviewed at length with the patient today.  Concerns regarding medicines are outlined above.   Tests Ordered: No orders of the defined types were placed in this encounter.   Medication Changes: No orders of the defined types were placed in this encounter.   Disposition:  Follow up with me in one year.   Signed, Rollene Rotunda, MD  07/13/2018 11:54 AM    [facility]

## 2018-07-13 ENCOUNTER — Telehealth (INDEPENDENT_AMBULATORY_CARE_PROVIDER_SITE_OTHER): Payer: Medicare Other | Admitting: Cardiology

## 2018-07-13 ENCOUNTER — Encounter: Payer: Self-pay | Admitting: Cardiology

## 2018-07-13 VITALS — Ht 63.0 in | Wt 180.0 lb

## 2018-07-13 DIAGNOSIS — I10 Essential (primary) hypertension: Secondary | ICD-10-CM

## 2018-07-13 NOTE — Patient Instructions (Signed)
Medication Instructions:  Continue current medications  If you need a refill on your cardiac medications before your next appointment, please call your pharmacy.  Labwork: None Ordered   Testing/Procedures: None Ordered  Follow-Up:  Your physician recommends that you schedule a follow-up appointment in: With Dr Tresa Endo for sleep Apnea  You will need a follow up appointment in 1 Year.  Please call our office 2 months in advance to schedule this appointment.  You may see Rollene Rotunda, MD or one of the following Advanced Practice Providers on your designated Care Team:   Theodore Demark, PA-C . Joni Reining, DNP, ANP      At [facility], you and your health needs are our priority.  As part of our continuing mission to provide you with exceptional heart care, we have created designated Provider Care Teams.  These Care Teams include your primary Cardiologist (physician) and Advanced Practice Providers (APPs -  Physician Assistants and Nurse Practitioners) who all work together to provide you with the care you need, when you need it.  Thank you for choosing [facility] at [facility]!!

## 2018-08-04 DIAGNOSIS — M62830 Muscle spasm of back: Secondary | ICD-10-CM | POA: Diagnosis not present

## 2018-08-04 DIAGNOSIS — L259 Unspecified contact dermatitis, unspecified cause: Secondary | ICD-10-CM | POA: Diagnosis not present

## 2018-08-04 DIAGNOSIS — M25512 Pain in left shoulder: Secondary | ICD-10-CM | POA: Diagnosis not present

## 2018-08-04 DIAGNOSIS — Z6832 Body mass index (BMI) 32.0-32.9, adult: Secondary | ICD-10-CM | POA: Diagnosis not present

## 2018-08-04 DIAGNOSIS — S46912A Strain of unspecified muscle, fascia and tendon at shoulder and upper arm level, left arm, initial encounter: Secondary | ICD-10-CM | POA: Diagnosis not present

## 2019-03-11 ENCOUNTER — Other Ambulatory Visit: Payer: Self-pay | Admitting: Cardiology

## 2019-06-09 ENCOUNTER — Other Ambulatory Visit: Payer: Self-pay | Admitting: Cardiology

## 2019-06-10 ENCOUNTER — Other Ambulatory Visit: Payer: Self-pay | Admitting: Cardiology

## 2019-07-06 ENCOUNTER — Encounter: Payer: Self-pay | Admitting: General Practice

## 2019-07-06 ENCOUNTER — Telehealth: Payer: Self-pay | Admitting: Cardiology

## 2019-07-06 NOTE — Telephone Encounter (Signed)
New Message     Pt is calling and is wondering if she can see Dr Antoine Poche over a Virtual visit   Please advise

## 2019-07-06 NOTE — Telephone Encounter (Signed)
Attempted to call patient to discuss virtual appointment. Preference is for office visit. No answer, phone continued to ring with no voicemail.

## 2019-08-01 DIAGNOSIS — Z7189 Other specified counseling: Secondary | ICD-10-CM | POA: Insufficient documentation

## 2019-08-01 NOTE — Progress Notes (Signed)
Virtual Visit via Telephone Note   This visit type was conducted due to national recommendations for restrictions regarding the COVID-19 Pandemic (e.g. social distancing) in an effort to limit this patient's exposure and mitigate transmission in our community.  Due to her co-morbid illnesses, this patient is at least at moderate risk for complications without adequate follow up.  This format is felt to be most appropriate for this patient at this time.  The patient did not have access to video technology/had technical difficulties with video requiring transitioning to audio format only (telephone).  All issues noted in this document were discussed and addressed.  No physical exam could be performed with this format.  Please refer to the patient's chart for her  consent to telehealth for [facility].   The patient was identified using 2 identifiers.  Date:  08/02/2019   ID:  [name], DOB 1950/08/25, MRN [id number]  Patient Location: Home Provider Location: Home  PCP:  Burnis Medin, MD  Cardiologist:  Minus Breeding, MD  Electrophysiologist:  None   Evaluation Performed:  Follow-Up Visit  Chief Complaint:  HTN  History of Present Illness:    [name] is a 69 y.o. female who presents for evaluation of difficult to control hypertension.   She was on spironolactone but had a rash and had to have this reduced.  She has been very sensitive to medications.  She has been using beet juice and this helps her BP.  She is trying to sleep better.  She thinks that her BP is doing better.  She takes a half a clonidine at times.   She overall feels better.  The patient denies any new symptoms such as chest discomfort, neck or arm discomfort. There has been no new shortness of breath, PND or orthopnea. There have been no reported palpitations, presyncope or syncope.   The patient does not have symptoms concerning for COVID-19 infection (fever, chills, cough, or new shortness  of breath).    Past Medical History:  Diagnosis Date  . Arthritis   . Chicken pox   . Depression   . Diverticulitis   . Elevated urinary free cortisol level 03/25/2014   mild with low potasssium  endo consult   disc with patient    . Hypertension   . Sciatica   . Seasonal allergies   . Stroke ([place]) 09/2008  . Thyroid disease    Past Surgical History:  Procedure Laterality Date  . MINOR BREAST BIOPSY  1979  . WISDOM TOOTH EXTRACTION       Current Meds  Medication Sig  . Ascorbic Acid (VITAMIN C) 1000 MG tablet Take 5,000 mg by mouth every morning.   . ASHWAGANDHA PO Take by mouth.  . B Complex-C (B-COMPLEX WITH VITAMIN C) tablet Take 1 tablet by mouth daily.    Marland Kitchen BYSTOLIC 20 MG TABS TAKE 1 TABLET EVERY MORNING  . Cholecalciferol (VITAMIN D PO) Take 5,000 Units by mouth daily.   . cloNIDine (CATAPRES) 0.1 MG tablet Take 1 tablet (0.1 mg total) by mouth 2 (two) times daily. (Patient taking differently: Take 0.1 mg by mouth as needed. Takes 1/2 tab as needed)  . hydrALAZINE (APRESOLINE) 50 MG tablet TAKE 1 TABLET THREE TIMES A DAY (Patient taking differently: 50 mg in the morning and at bedtime. )  . Magnesium Hydroxide (MAGNESIA PO) Take 200 mg by mouth daily.  Edyth Gunnels Oleifera (MORINGA PO) Take by mouth daily.  . MULTIPLE VITAMIN PO Take 1 tablet by  mouth daily.   Marland Kitchen NATTOKINASE PO Take 1 tablet by mouth every morning. Helps blood flow  . NON FORMULARY Chlorella:  Takes 15 capsules daily  . Probiotic Product (PROBIOTIC PO) Take 1 tablet by mouth daily. Bifido Beadlet 50+  . SPIRULINA PO Take 1 teaspoon by mouth daily  . [DISCONTINUED] LevOCARNitine (L-CARNITINE PO) Take by mouth every morning.      Allergies:   Isovue [iopamidol], Penicillins, and Sulfa antibiotics   Social History   Tobacco Use  . Smoking status: Never Smoker  . Smokeless tobacco: Never Used  Substance Use Topics  . Alcohol use: Yes    Comment: Social Use only  . Drug use: No     Family Hx: The  patient's family history includes Cancer in her brother; Diabetes in her maternal grandmother and mother; Heart disease in her mother; Heart failure in her mother; Hypertension in her brother, father, mother, sister, and sister.  ROS:   Please see the history of present illness.     All other systems reviewed and are negative.   Prior CV studies:   The following studies were reviewed today:  None  Labs/Other Tests and Data Reviewed:    EKG:  No ECG reviewed.  Recent Labs: No results found for requested labs within last 8760 hours.   Recent Lipid Panel Lab Results  Component Value Date/Time   CHOL 168 01/02/2018 09:49 AM   TRIG 82.0 01/02/2018 09:49 AM   HDL 63.00 01/02/2018 09:49 AM   CHOLHDL 3 01/02/2018 09:49 AM   LDLCALC 88 01/02/2018 09:49 AM    Wt Readings from Last 3 Encounters:  07/13/18 180 lb (81.6 kg)  12/30/17 184 lb 3.2 oz (83.6 kg)  10/20/17 184 lb 3.2 oz (83.6 kg)     Objective:    Vital Signs:  BP 140/80   Pulse 60   SpO2 98%    VITAL SIGNS:  reviewed  ASSESSMENT & PLAN:    HTN:The blood pressure is at target. No change in medications is indicated. We will continue with therapeutic lifestyle changes (TLC).  PVCs:She is not feeling these.  No change in therapy.   LBBB: This is chronic.    FATIGUE:  I have her set up to see Dr. Tresa Endo in August.  She previously cancelled an appt with him.   COVID-19 EDUCATION:   She does not want the vaccine at this point.    Time:   Today, I have spent 18 minutes with the patient with telehealth technology discussing the above problems.     Medication Adjustments/Labs and Tests Ordered: Current medicines are reviewed at length with the patient today.  Concerns regarding medicines are outlined above.   Tests Ordered: No orders of the defined types were placed in this encounter.   Medication Changes: No orders of the defined types were placed in this encounter.   Follow Up:  Either In  Person or Virtual in one year  Signed, Rollene Rotunda, MD  08/02/2019 1:13 PM    [facility]

## 2019-08-02 ENCOUNTER — Telehealth (INDEPENDENT_AMBULATORY_CARE_PROVIDER_SITE_OTHER): Payer: Medicare Other | Admitting: Cardiology

## 2019-08-02 VITALS — BP 140/80 | HR 60

## 2019-08-02 DIAGNOSIS — I160 Hypertensive urgency: Secondary | ICD-10-CM

## 2019-08-02 DIAGNOSIS — I447 Left bundle-branch block, unspecified: Secondary | ICD-10-CM

## 2019-08-02 DIAGNOSIS — R5383 Other fatigue: Secondary | ICD-10-CM

## 2019-08-02 DIAGNOSIS — Z7189 Other specified counseling: Secondary | ICD-10-CM

## 2019-08-02 DIAGNOSIS — I493 Ventricular premature depolarization: Secondary | ICD-10-CM

## 2019-08-02 NOTE — Patient Instructions (Signed)
Medication Instructions:  NO CHANGES *If you need a refill on your cardiac medications before your next appointment, please call your pharmacy*  Lab Work: NONE ORDERED THIS VISIT  Testing/Procedures: NONE ORDERED THIS VISIT  Follow-Up: At [facility], you and your health needs are our priority.  As part of our continuing mission to provide you with exceptional heart care, we have created designated Provider Care Teams.  These Care Teams include your primary Cardiologist (physician) and Advanced Practice Providers (APPs -  Physician Assistants and Nurse Practitioners) who all work together to provide you with the care you need, when you need it.  We recommend signing up for the patient portal called "MyChart".  Sign up information is provided on this After Visit Summary.  MyChart is used to connect with patients for Virtual Visits (Telemedicine).  Patients are able to view lab/test results, encounter notes, upcoming appointments, etc.  Non-urgent messages can be sent to your provider as well.   To learn more about what you can do with MyChart, go to [email or web address]://[email or web address].com.    Your next appointment:   12 month(s)  You will receive a reminder letter in the mail two months in advance. If you don't receive a letter, please call our office to schedule the follow-up appointment.  The format for your next appointment:   In Person  Provider:   James Hochrein, MD  

## 2019-09-03 ENCOUNTER — Emergency Department (HOSPITAL_COMMUNITY): Payer: Medicare Other

## 2019-09-03 ENCOUNTER — Inpatient Hospital Stay (HOSPITAL_COMMUNITY)
Admission: EM | Admit: 2019-09-03 | Discharge: 2019-09-06 | DRG: 065 | Disposition: A | Discharge status: Home Health | Payer: Medicare Other | Source: Ambulatory Visit | Attending: Internal Medicine | Admitting: Internal Medicine

## 2019-09-03 ENCOUNTER — Other Ambulatory Visit: Payer: Self-pay

## 2019-09-03 ENCOUNTER — Ambulatory Visit
Admission: EM | Admit: 2019-09-03 | Discharge: 2019-09-03 | Disposition: A | Discharge status: Home / Self Care | Payer: TRICARE For Life (TFL)

## 2019-09-03 ENCOUNTER — Encounter (HOSPITAL_COMMUNITY): Payer: Self-pay

## 2019-09-03 DIAGNOSIS — Z880 Allergy status to penicillin: Secondary | ICD-10-CM | POA: Diagnosis not present

## 2019-09-03 DIAGNOSIS — Z20822 Contact with and (suspected) exposure to covid-19: Secondary | ICD-10-CM | POA: Diagnosis present

## 2019-09-03 DIAGNOSIS — I351 Nonrheumatic aortic (valve) insufficiency: Secondary | ICD-10-CM | POA: Diagnosis not present

## 2019-09-03 DIAGNOSIS — G8191 Hemiplegia, unspecified affecting right dominant side: Secondary | ICD-10-CM | POA: Diagnosis present

## 2019-09-03 DIAGNOSIS — I447 Left bundle-branch block, unspecified: Secondary | ICD-10-CM | POA: Diagnosis present

## 2019-09-03 DIAGNOSIS — I10 Essential (primary) hypertension: Secondary | ICD-10-CM | POA: Diagnosis not present

## 2019-09-03 DIAGNOSIS — I160 Hypertensive urgency: Secondary | ICD-10-CM

## 2019-09-03 DIAGNOSIS — H538 Other visual disturbances: Secondary | ICD-10-CM | POA: Diagnosis present

## 2019-09-03 DIAGNOSIS — I639 Cerebral infarction, unspecified: Secondary | ICD-10-CM | POA: Diagnosis present

## 2019-09-03 DIAGNOSIS — Z6833 Body mass index (BMI) 33.0-33.9, adult: Secondary | ICD-10-CM | POA: Diagnosis not present

## 2019-09-03 DIAGNOSIS — F329 Major depressive disorder, single episode, unspecified: Secondary | ICD-10-CM | POA: Diagnosis present

## 2019-09-03 DIAGNOSIS — I2510 Atherosclerotic heart disease of native coronary artery without angina pectoris: Secondary | ICD-10-CM | POA: Diagnosis present

## 2019-09-03 DIAGNOSIS — E785 Hyperlipidemia, unspecified: Secondary | ICD-10-CM | POA: Diagnosis present

## 2019-09-03 DIAGNOSIS — R29701 NIHSS score 1: Secondary | ICD-10-CM | POA: Diagnosis present

## 2019-09-03 DIAGNOSIS — I129 Hypertensive chronic kidney disease with stage 1 through stage 4 chronic kidney disease, or unspecified chronic kidney disease: Secondary | ICD-10-CM | POA: Diagnosis present

## 2019-09-03 DIAGNOSIS — Z8249 Family history of ischemic heart disease and other diseases of the circulatory system: Secondary | ICD-10-CM | POA: Diagnosis not present

## 2019-09-03 DIAGNOSIS — Z79899 Other long term (current) drug therapy: Secondary | ICD-10-CM

## 2019-09-03 DIAGNOSIS — Z91041 Radiographic dye allergy status: Secondary | ICD-10-CM

## 2019-09-03 DIAGNOSIS — I161 Hypertensive emergency: Secondary | ICD-10-CM | POA: Diagnosis present

## 2019-09-03 DIAGNOSIS — E039 Hypothyroidism, unspecified: Secondary | ICD-10-CM | POA: Diagnosis present

## 2019-09-03 DIAGNOSIS — E876 Hypokalemia: Secondary | ICD-10-CM | POA: Diagnosis not present

## 2019-09-03 DIAGNOSIS — I6381 Other cerebral infarction due to occlusion or stenosis of small artery: Secondary | ICD-10-CM | POA: Diagnosis present

## 2019-09-03 DIAGNOSIS — N182 Chronic kidney disease, stage 2 (mild): Secondary | ICD-10-CM | POA: Diagnosis present

## 2019-09-03 DIAGNOSIS — E669 Obesity, unspecified: Secondary | ICD-10-CM | POA: Diagnosis present

## 2019-09-03 DIAGNOSIS — Z882 Allergy status to sulfonamides status: Secondary | ICD-10-CM | POA: Diagnosis not present

## 2019-09-03 DIAGNOSIS — Z8673 Personal history of transient ischemic attack (TIA), and cerebral infarction without residual deficits: Secondary | ICD-10-CM | POA: Diagnosis not present

## 2019-09-03 DIAGNOSIS — Z833 Family history of diabetes mellitus: Secondary | ICD-10-CM | POA: Diagnosis not present

## 2019-09-03 LAB — CBC WITH DIFFERENTIAL/PLATELET
Abs Immature Granulocytes: 0.02 10*3/uL (ref 0.00–0.07)
Basophils Absolute: 0.1 10*3/uL (ref 0.0–0.1)
Basophils Relative: 1 %
Eosinophils Absolute: 0.2 10*3/uL (ref 0.0–0.5)
Eosinophils Relative: 2 %
HCT: 45.8 % (ref 36.0–46.0)
Hemoglobin: 15 g/dL (ref 12.0–15.0)
Immature Granulocytes: 0 %
Lymphocytes Relative: 23 %
Lymphs Abs: 2.1 10*3/uL (ref 0.7–4.0)
MCH: 31.7 pg (ref 26.0–34.0)
MCHC: 32.8 g/dL (ref 30.0–36.0)
MCV: 96.8 fL (ref 80.0–100.0)
Monocytes Absolute: 0.7 10*3/uL (ref 0.1–1.0)
Monocytes Relative: 8 %
Neutro Abs: 6 10*3/uL (ref 1.7–7.7)
Neutrophils Relative %: 66 %
Platelets: 187 10*3/uL (ref 150–400)
RBC: 4.73 MIL/uL (ref 3.87–5.11)
RDW: 13 % (ref 11.5–15.5)
WBC: 9 10*3/uL (ref 4.0–10.5)
nRBC: 0 % (ref 0.0–0.2)

## 2019-09-03 LAB — URINALYSIS, ROUTINE W REFLEX MICROSCOPIC
Bacteria, UA: NONE SEEN
Bilirubin Urine: NEGATIVE
Glucose, UA: NEGATIVE mg/dL
Hgb urine dipstick: NEGATIVE
Ketones, ur: 5 mg/dL — AB
Nitrite: NEGATIVE
Protein, ur: NEGATIVE mg/dL
Specific Gravity, Urine: 1.004 — ABNORMAL LOW (ref 1.005–1.030)
pH: 7 (ref 5.0–8.0)

## 2019-09-03 LAB — BASIC METABOLIC PANEL
Anion gap: 10 (ref 5–15)
BUN: 15 mg/dL (ref 8–23)
CO2: 27 mmol/L (ref 22–32)
Calcium: 8.9 mg/dL (ref 8.9–10.3)
Chloride: 105 mmol/L (ref 98–111)
Creatinine, Ser: 1.04 mg/dL — ABNORMAL HIGH (ref 0.44–1.00)
GFR calc Af Amer: >60 mL/min (ref >60)
GFR calc non Af Amer: 55 mL/min — ABNORMAL LOW (ref >60)
Glucose, Bld: 101 mg/dL — ABNORMAL HIGH (ref 70–99)
Potassium: 3.6 mmol/L (ref 3.5–5.1)
Sodium: 142 mmol/L (ref 135–145)

## 2019-09-03 LAB — SARS CORONAVIRUS 2 BY RT PCR (HOSPITAL ORDER, PERFORMED IN ~~LOC~~ HOSPITAL LAB): SARS Coronavirus 2: NEGATIVE

## 2019-09-03 MED ORDER — STROKE: EARLY STAGES OF RECOVERY BOOK
Freq: Once | Status: DC
  Filled 2019-09-03 (×2): qty 1

## 2019-09-03 MED ORDER — ACETAMINOPHEN 160 MG/5ML PO SOLN: 650.0000 mg | ORAL | Status: DC | PRN

## 2019-09-03 MED ORDER — ATORVASTATIN CALCIUM 80 MG PO TABS
80.0000 mg | ORAL_TABLET | Freq: Every day | ORAL | Status: DC
  Administered 2019-09-04 – 2019-09-05 (×2): 80 mg via ORAL
  Filled 2019-09-03: qty 1
  Filled 2019-09-03: qty 2

## 2019-09-03 MED ORDER — ACETAMINOPHEN 650 MG RE SUPP: 650.0000 mg | RECTAL | Status: DC | PRN

## 2019-09-03 MED ORDER — HYDRALAZINE HCL 20 MG/ML IJ SOLN
10.0000 mg | Freq: Once | INTRAMUSCULAR | Status: AC
  Administered 2019-09-03: 10 mg via INTRAVENOUS
  Filled 2019-09-03: qty 1

## 2019-09-03 MED ORDER — ENOXAPARIN SODIUM 40 MG/0.4ML ~~LOC~~ SOLN
40.0000 mg | SUBCUTANEOUS | Status: DC
  Administered 2019-09-04 – 2019-09-06 (×3): 40 mg via SUBCUTANEOUS
  Filled 2019-09-03 (×3): qty 0.4

## 2019-09-03 MED ORDER — LABETALOL HCL 5 MG/ML IV SOLN
20.0000 mg | Freq: Once | INTRAVENOUS | Status: AC
  Administered 2019-09-03: 20 mg via INTRAVENOUS
  Filled 2019-09-03: qty 4

## 2019-09-03 MED ORDER — ASPIRIN 81 MG PO CHEW
81.0000 mg | CHEWABLE_TABLET | Freq: Every day | ORAL | Status: DC
  Administered 2019-09-04 – 2019-09-06 (×3): 81 mg via ORAL
  Filled 2019-09-03 (×3): qty 1

## 2019-09-03 MED ORDER — SENNOSIDES-DOCUSATE SODIUM 8.6-50 MG PO TABS: 1.0000 | ORAL_TABLET | Freq: Every evening | ORAL | Status: DC | PRN

## 2019-09-03 MED ORDER — ACETAMINOPHEN 325 MG PO TABS: 650.0000 mg | ORAL_TABLET | ORAL | Status: DC | PRN

## 2019-09-03 NOTE — ED Provider Notes (Signed)
[facility]-EMERGENCY DEPT Provider Note   CSN: [id number] Arrival date & time: 09/03/19  1839     History Chief Complaint  Patient presents with  . Hypertension    Verlon SettingLina Brewington-Glenn is a 69 y.o. female.  She has a history of hypertension coronary disease, stroke 12 years ago.  She has had intermittent symptoms of heaviness and burning pain on the right side of her body since the stroke.  It usually when her blood pressure is up.  She is complaining of feeling the symptoms again for the last 2 or 3 days.  Sometimes involves her toes sometimes the whole foot.  Sometimes involves the fingertips and sometimes the hand past the wrist.  She is also felt some burning pain on the right side of her face and the tip of her tongue.  No visual changes.  She notices she is not clearing her foot going up the stairs on the right.  Lightheaded at times.  She went to urgent care where her blood pressure was elevated.  Has been taking her regular medications.  Think she might not be sticking to her diet as she should.  No chest pain or shortness of breath.  The history is provided by the patient.  Hypertension This is a chronic problem. The current episode started more than 2 days ago. The problem has not changed since onset.Pertinent negatives include no chest pain, no abdominal pain, no headaches and no shortness of breath. Nothing aggravates the symptoms. Nothing relieves the symptoms. She has tried nothing for the symptoms. The treatment provided no relief.  Cerebrovascular Accident This is a recurrent problem. The current episode started 2 days ago. The problem occurs rarely. The problem has not changed since onset.Pertinent negatives include no chest pain, no abdominal pain, no headaches and no shortness of breath. Nothing aggravates the symptoms. Nothing relieves the symptoms. She has tried nothing for the symptoms. The treatment provided no relief.       Past Medical History:    Diagnosis Date  . Arthritis   . Chicken pox   . Depression   . Diverticulitis   . Elevated urinary free cortisol level 03/25/2014   mild with low potasssium  endo consult   disc with patient    . Hypertension   . Sciatica   . Seasonal allergies   . Stroke (HCC) 09/2008  . Thyroid disease     Patient Active Problem List   Diagnosis Date Noted  . Educated about COVID-19 virus infection 08/01/2019  . Influenza vaccination declined 11/17/2016  . TIA (transient ischemic attack) 09/28/2016  . Right sided weakness 09/28/2016  . Special screening for malignant neoplasms, colon 03/05/2015  . Change in bowel habits 03/05/2015  . CAD (coronary artery disease) 09/25/2014  . Elevated hemoglobin (HCC) 05/24/2014  . Pulse slow 04/26/2014  . Low serum potassium level 03/25/2014  . Hypertensive heart disease without heart failure 12/22/2013  . Essential hypertension 12/21/2013  . PVC (premature ventricular contraction) 09/11/2013  . Death of family member 08/24/2013  . Left bundle branch block 07/25/2013  . Right thyroid nodule 07/18/2013  . Fatigue 07/18/2013  . Disorder of tooth 07/18/2013  . Autoimmune thyroiditis 06/24/2013  . Elevated TSH 06/19/2013  . Goiter 06/19/2013  . Left bundle branch block (LBBB) on electrocardiogram 06/19/2013  . Snoring 06/19/2013  . Alternative medicine 06/19/2013  . Hypertensive urgency 05/23/2013  . History of CVA (cerebrovascular accident) 05/23/2013  . Abdominal discomfort 05/23/2013  . DYSLIPIDEMIA 10/24/2009  .  OBESITY, UNSPECIFIED 12/04/2008  . AMI, NON-Q WAVE, INITIAL EPISODE 12/04/2008  . ALLERGIC RHINITIS 12/02/2008    Past Surgical History:  Procedure Laterality Date  . MINOR BREAST BIOPSY  1979  . WISDOM TOOTH EXTRACTION       OB History    Gravida  2   Para      Term      Preterm      AB      Living  2     SAB      TAB      Ectopic      Multiple      Live Births              Family History  Problem  Relation Age of Onset  . Heart failure Mother   . Heart disease Mother   . Diabetes Mother   . Hypertension Mother   . Hypertension Father   . Diabetes Maternal Grandmother   . Hypertension Sister   . Hypertension Brother   . Cancer Brother        Prostate  . Hypertension Sister     Social History   Tobacco Use  . Smoking status: Never Smoker  . Smokeless tobacco: Never Used  Vaping Use  . Vaping Use: Never assessed  Substance Use Topics  . Alcohol use: Yes    Comment: Social Use only  . Drug use: No    Home Medications Prior to Admission medications   Medication Sig Start Date End Date Taking? Authorizing Provider  Ascorbic Acid (VITAMIN C) 1000 MG tablet Take 5,000 mg by mouth every morning.     [provider]  ASHWAGANDHA PO Take by mouth.    [provider]  B Complex-C (B-COMPLEX WITH VITAMIN C) tablet Take 1 tablet by mouth daily.      [provider]  BYSTOLIC 20 MG TABS TAKE 1 TABLET EVERY MORNING 06/11/19   Rollene Rotunda, MD  Cholecalciferol (VITAMIN D PO) Take 5,000 Units by mouth daily.     [provider]  cloNIDine (CATAPRES) 0.1 MG tablet Take 1 tablet (0.1 mg total) by mouth 2 (two) times daily. Patient taking differently: Take 0.1 mg by mouth as needed. Takes 1/2 tab as needed 06/11/19   Rollene Rotunda, MD  hydrALAZINE (APRESOLINE) 50 MG tablet TAKE 1 TABLET THREE TIMES A DAY Patient taking differently: 50 mg in the morning and at bedtime.  06/11/19   Rollene Rotunda, MD  Magnesium Hydroxide (MAGNESIA PO) Take 200 mg by mouth daily.    [provider]  Moringa Oleifera (MORINGA PO) Take by mouth daily.    [provider]  MULTIPLE VITAMIN PO Take 1 tablet by mouth daily.     [provider]  NATTOKINASE PO Take 1 tablet by mouth every morning. Helps blood flow    [provider]  NON FORMULARY Chlorella:  Takes 15 capsules daily    [provider]  Probiotic Product (PROBIOTIC  PO) Take 1 tablet by mouth daily. Bifido Beadlet 50+    [provider]  SPIRULINA PO Take 1 teaspoon by mouth daily    [provider]    Allergies    Isovue [iopamidol], Penicillins, and Sulfa antibiotics  Review of Systems   Review of Systems  Constitutional: Positive for fatigue. Negative for fever.  HENT: Negative for sore throat.   Eyes: Positive for visual disturbance (chronic retinal issues).  Respiratory: Negative for shortness of breath.   Cardiovascular: Negative  for chest pain.  Gastrointestinal: Negative for abdominal pain.  Genitourinary: Negative for dysuria.  Musculoskeletal: Positive for gait problem.  Skin: Negative for rash.  Neurological: Positive for weakness (heaviness) and numbness. Negative for headaches.    Physical Exam Updated Vital Signs BP (!) 224/114   Pulse 71   Temp 98.9 F (37.2 C) (Oral)   Resp (!) 24   SpO2 100%   Physical Exam Vitals and nursing note reviewed.  Constitutional:      General: She is not in acute distress.    Appearance: Normal appearance. She is well-developed.  HENT:     Head: Normocephalic and atraumatic.  Eyes:     Conjunctiva/sclera: Conjunctivae normal.  Cardiovascular:     Rate and Rhythm: Normal rate and regular rhythm.     Heart sounds: No murmur heard.   Pulmonary:     Effort: Pulmonary effort is normal. No respiratory distress.     Breath sounds: Normal breath sounds.  Abdominal:     Palpations: Abdomen is soft.     Tenderness: There is no abdominal tenderness.  Musculoskeletal:        General: No deformity or signs of injury. Normal range of motion.     Cervical back: Neck supple.  Skin:    General: Skin is warm and dry.     Capillary Refill: Capillary refill takes less than 2 seconds.  Neurological:     General: No focal deficit present.     Mental Status: She is alert and oriented to person, place, and time.     Sensory: No sensory deficit.     Motor: No weakness.     ED  Results / Procedures / Treatments   Labs (all labs ordered are listed, but only abnormal results are displayed) Labs Reviewed  BASIC METABOLIC PANEL - Abnormal; Notable for the following components:      Result Value   Glucose, Bld 101 (*)    Creatinine, Ser 1.04 (*)    GFR calc non Af Amer 55 (*)    All other components within normal limits  URINALYSIS, ROUTINE W REFLEX MICROSCOPIC - Abnormal; Notable for the following components:   Color, Urine COLORLESS (*)    Specific Gravity, Urine 1.004 (*)    Ketones, ur 5 (*)    Leukocytes,Ua SMALL (*)    All other components within normal limits  LIPID PANEL - Abnormal; Notable for the following components:   LDL Cholesterol 100 (*)    All other components within normal limits  SARS CORONAVIRUS 2 BY RT PCR (HOSPITAL ORDER, PERFORMED IN [facility])  CBC WITH DIFFERENTIAL/PLATELET  HIV ANTIBODY (ROUTINE TESTING W REFLEX)  HEMOGLOBIN A1C    EKG EKG Interpretation  Date/Time:  Monday September 03 2019 19:01:41 EDT Ventricular Rate:  70 PR Interval:    QRS Duration: 143 QT Interval:  458 QTC Calculation: 495 R Axis:   -47 Text Interpretation: Sinus rhythm Prolonged PR interval Left bundle branch block No significant change since prior 7/18 Confirmed by Meridee Score [phone]) on 09/03/2019 8:21:52 PM   Radiology CT Head Wo Contrast  Result Date: 09/03/2019 CLINICAL DATA:  Stroke suspected EXAM: CT HEAD WITHOUT CONTRAST TECHNIQUE: Contiguous axial images were obtained from the base of the skull through the vertex without intravenous contrast. COMPARISON:  None. FINDINGS: Brain: No acute intracranial hemorrhage. No focal mass lesion. No CT evidence of acute infarction. No midline shift or mass effect. No hydrocephalus. Basilar cisterns are patent. There are periventricular and subcortical  white matter hypodensities. Vascular: No hyperdense vessel or unexpected calcification. Skull: Normal. Negative for fracture or focal lesion.  Sinuses/Orbits: Paranasal sinuses and mastoid air cells are clear. Orbits are clear. Other: None. IMPRESSION: 1. No acute intracranial findings. 2. White matter microvascular disease. Electronically Signed   By: Genevive Bi M.D.   On: 09/03/2019 20:28   MR Brain Wo Contrast (neuro protocol)  Result Date: 09/03/2019 CLINICAL DATA:  Right-sided weakness. EXAM: MRI HEAD WITHOUT CONTRAST TECHNIQUE: Multiplanar, multiecho pulse sequences of the brain and surrounding structures were obtained without intravenous contrast. COMPARISON:  Head CT 09/03/2019 Brain MRI 09/28/2016 FINDINGS: Brain: Small acute infarct of the anterolateral left thalamus, adjacent to the posterior limb of the internal capsule. No acute hemorrhage. Early confluent hyperintense T2-weighted signal of the periventricular and deep white matter, most commonly due to chronic ischemic microangiopathy. Normal volume of CSF spaces. No chronic microhemorrhage. Normal midline structures. Vascular: Normal flow voids. Skull and upper cervical spine: Normal marrow signal. Sinuses/Orbits: Negative. Other: None. IMPRESSION: 1. Small acute infarct of the anterolateral left thalamus, adjacent to the posterior limb of the internal capsule. No acute hemorrhage or mass effect. 2. Findings of chronic ischemic microangiopathy. Electronically Signed   By: Deatra Robinson M.D.   On: 09/03/2019 22:55    Procedures .Critical Care Performed by: Terrilee Files, MD Authorized by: Terrilee Files, MD   Critical care provider statement:    Critical care time (minutes):  45   Critical care time was exclusive of:  Separately billable procedures and treating other patients   Critical care was necessary to treat or prevent imminent or life-threatening deterioration of the following conditions:  CNS failure or compromise   Critical care was time spent personally by me on the following activities:  Discussions with consultants, evaluation of patient's response to  treatment, examination of patient, ordering and performing treatments and interventions, ordering and review of laboratory studies, ordering and review of radiographic studies, pulse oximetry, re-evaluation of patient's condition, obtaining history from patient or surrogate, review of old charts and development of treatment plan with patient or surrogate   I assumed direction of critical care for this patient from another provider in my specialty: no     (including critical care time)  Medications Ordered in ED Medications   stroke: mapping our early stages of recovery book (has no administration in time range)  acetaminophen (TYLENOL) tablet 650 mg (has no administration in time range)    Or  acetaminophen (TYLENOL) 160 MG/5ML solution 650 mg (has no administration in time range)    Or  acetaminophen (TYLENOL) suppository 650 mg (has no administration in time range)  senna-docusate (Senokot-S) tablet 1 tablet (has no administration in time range)  enoxaparin (LOVENOX) injection 40 mg (has no administration in time range)  aspirin chewable tablet 81 mg (has no administration in time range)  atorvastatin (LIPITOR) tablet 80 mg (has no administration in time range)  predniSONE (DELTASONE) tablet 50 mg (50 mg Oral Refused 09/04/19 0834)  diphenhydrAMINE (BENADRYL) capsule 50 mg (has no administration in time range)    Or  diphenhydrAMINE (BENADRYL) injection 50 mg (has no administration in time range)  hydrALAZINE (APRESOLINE) tablet 25 mg (has no administration in time range)  hydrALAZINE (APRESOLINE) injection 10 mg (10 mg Intravenous Given 09/03/19 1933)  labetalol (NORMODYNE) injection 20 mg (20 mg Intravenous Given 09/03/19 2051)    ED Course  I have reviewed the triage vital signs and the nursing notes.  Pertinent labs & imaging  results that were available during my care of the patient were reviewed by me and considered in my medical decision making (see chart for details).  Clinical  Course as of Sep 04 1022  Mon Sep 03, 2019  2151 Discussed with Dr. Leonel Ramsay neurology who recommended an MRI.  If that is negative then it could be just manage her blood pressure.  If she needs admission she will need admission to [facility] where they can see her.   [MB]    Clinical Course User Index [MB] Hayden Rasmussen, MD   MDM Rules/Calculators/A&P                         This patient complains of elevated blood pressure and right-sided neurologic deficits; this involves an extensive number of treatment Options and is a complaint that carries with it a high risk of complications and Morbidity. The differential includes hypertensive urgency, hypertensive emergency, stroke, TIA, metabolic derangement  I ordered, reviewed and interpreted labs, which included CBC with normal white count normal hemoglobin, chemistry with mildly elevated creatinine, urinalysis without gross signs of infection, Covid testing negative I ordered medication IV hydralazine and IV labetalol for blood pressure control I ordered imaging studies which included CT head and MRI brain and I independently    visualized and interpreted imaging which showed acute stroke Previous records obtained and reviewed in epic including prior admission 4 years ago for hypertensive urgency and similar neurologic symptoms I consulted Dr. Leonel Ramsay neurology and [facility] hospitalist Dr. Flossie Buffy and discussed lab and imaging findings  Critical Interventions: Management of hypertensive urgency with IV medication, identification of acute stroke  After the interventions stated above, I reevaluated the patient and found patient to be still with elevated blood pressures.  Due to this and evidence of stroke on MRI will need admission to the hospital for further work-up.  Due to stroke will allow permissive hypertension.   Final Clinical Impression(s) / ED Diagnoses Final diagnoses:  Hypertensive urgency  Acute CVA (cerebrovascular accident) [facility])     Rx / [place] Orders ED Discharge Orders    None       Hayden Rasmussen, MD 09/04/19 1028

## 2019-09-03 NOTE — Discharge Instructions (Addendum)
69 year old female with history of CVA, CAD, HTN comes in right-sided heaviness starting last night.  Started to the right lower leg, worse when sleeping, improved after movement this morning.  However, about 6 hours ago, started having right upper extremity weakness, heaviness.  She has residual numbness/tingling intermittently to the right side body due to last CVA, but has noticed more burning sensation since symptom onset.  Denies chest pain, shortness of breath, facial drooping, slurring of words, confusion.  Feels that since she has been this clinic, has felt more fatigued.  BP 201/109, HR 71, RR 20, temp 99.5, O2 94%  Constitutional: nontoxic, no acute distress. Heart: RRR Lungs: CTAB Neuro: alert and oriented x 4. Cranial nerves II-XII grossly intact. Strength 5/5 bilaterally for upper and lower extremity. Sensation intact. Normal coordination with normal finger to nose, heel to shin. Negative pronator drift, romberg. Gait intact. Able to ambulate on own without difficulty.   Patient current with grossly intact neurology exam, no focal deficits.  However, given BP over 200 systolic, having perceived neurological symptoms, will discharge in stable condition to the ED for further evaluation management needed.

## 2019-09-03 NOTE — ED Notes (Signed)
Pt informed that we need a urine sample. Pt states that she will let us know when she is able to give [name] a sample

## 2019-09-03 NOTE — ED Provider Notes (Signed)
69 year old female with history of CVA, CAD, HTN comes in right-sided heaviness starting last night.  Started to the right lower leg, worse when sleeping, improved after movement this morning.  However, about 6 hours ago, started having right upper extremity weakness, heaviness.  She has residual numbness/tingling intermittently to the right side body due to last CVA, but has noticed more burning sensation since symptom onset.  Denies chest pain, shortness of breath, facial drooping, slurring of words, confusion.  Feels that since she has been this clinic, has felt more fatigued.  BP 201/109, HR 71, RR 20, temp 99.5, O2 94%  Constitutional: nontoxic, no acute distress. Heart: RRR Lungs: CTAB Neuro: alert and oriented x 4. Cranial nerves II-XII grossly intact. Strength 5/5 bilaterally for upper and lower extremity. Sensation intact. Normal coordination with normal finger to nose, heel to shin. Negative pronator drift, romberg. Gait intact. Able to ambulate on own without difficulty.   Patient current with grossly intact neurology exam, no focal deficits.  However, given BP over 200 systolic, having perceived neurological symptoms, will discharge in stable condition to the ED for further evaluation management needed.   Belinda Fisher, PA-C 09/03/19 1815

## 2019-09-03 NOTE — ED Triage Notes (Signed)
Pt c/o rt sided heaviness since yesterday evening. States her b/p was high, didn't test it but could feel it. States took a clonidine 5 mins ago.

## 2019-09-03 NOTE — ED Triage Notes (Addendum)
Patient sent from urgent care.    C/O high BP, right side body feels heavy X1 day, leg pain "for a while". Pins and needle feeling in hand.    A/Ox4 Ambulatory in triage.

## 2019-09-03 NOTE — H&P (Addendum)
History and Physical    [name] YQM:[id number] DOB: 1950/04/07 DOA: 09/03/2019  PCP: Burnis Medin, MD  Patient coming from: Home, accompanied by son at bedside  I have personally briefly reviewed patient's old medical records in [place]  Chief Complaint: Right lower/upper extremity paresthesia and heaviness  HPI: [name] is a 69 y.o. female with medical history significant forHistory of CVA, hypertension, CAD, left LBBB who presents with concerns of left sided heaviness and hypertension.  Patient reports that last night around 10 PM she began to notice some tingling sensation of her right toe which she said has happened in her previous stroke and usually is associated with high blood pressure.  However she also felt that her lower extremities heavier and harder to get up the stairs which is new for her.  She also feels right hand and right face paresthesia as well as feel like some of her fingers on right hand are swollen.  Has a headache now but did not before.  Has been noting on and off blurred vision with floaters.  Denies any slurred speech.  Patient is not on aspirin and says that someone in the past told her it was okay to come off of it.  She mostly only takes herbal supplements.  Patient presented to urgent care today with similar symptoms and noted to have systolic blood pressure 528 was sent to ED for further eval.  She follows with cardiology Dr. Percival Spanish for her resistent HTN.   She was hypertensive up to 224/114 on arrival.  This improved down to 185/104 following 10 mg of hydralazine and 20 mg of labetalol. CBC, BMP otherwise unremarkable.  CT head negative. MRI brain later shows small acute infarct of the anterolateral left thalamus.   ED physician Dr. Melina Copa spoke with neurology Dr. Leonel Ramsay would like her to be transferred to [facility] for further stroke work-up.  Review of Systems:  Constitutional: No Weight Change, No  Fever ENT/Mouth: No sore throat, No Rhinorrhea Eyes: No Eye Pain, No Vision Changes Cardiovascular: No Chest Pain, no SOB Respiratory: No Cough, No Sputum Gastrointestinal: No Nausea, No Vomiting, No Diarrhea, No Constipation, No Pain Genitourinary: no Urinary Incontinence, No Urgency, No Flank Pain Musculoskeletal: No Arthralgias, No Myalgias Skin: No Skin Lesions, No Pruritus, Neuro: no Weakness, + Numbness Psych: No Anxiety/Panic, No Depression, no decrease appetite Heme/Lymph: No Bruising, No Bleeding  Past Medical History:  Diagnosis Date  . Arthritis   . Chicken pox   . Depression   . Diverticulitis   . Elevated urinary free cortisol level 03/25/2014   mild with low potasssium  endo consult   disc with patient    . Hypertension   . Sciatica   . Seasonal allergies   . Stroke ([place]) 09/2008  . Thyroid disease     Past Surgical History:  Procedure Laterality Date  . MINOR BREAST BIOPSY  1979  . WISDOM TOOTH EXTRACTION       reports that she has never smoked. She has never used smokeless tobacco. She reports current alcohol use. She reports that she does not use drugs.  Allergies  Allergen Reactions  . Isovue [Iopamidol] Itching    Pt states she had itching after CT in 2015.  Marland Kitchen Penicillins Swelling    Has patient had a PCN reaction causing immediate rash, facial/tongue/throat swelling, SOB or lightheadedness with hypotension: yes Has patient had a PCN reaction causing severe rash involving mucus membranes or skin necrosis: no Has patient had  a PCN reaction that required hospitalization no Has patient had a PCN reaction occurring within the last 10 years: no If all of the above answers are "NO", then may proceed with Cephalosporin use.   . Sulfa Antibiotics Itching    Family History  Problem Relation Age of Onset  . Heart failure Mother   . Heart disease Mother   . Diabetes Mother   . Hypertension Mother   . Hypertension Father   . Diabetes Maternal Grandmother    . Hypertension Sister   . Hypertension Brother   . Cancer Brother        Prostate  . Hypertension Sister      Prior to Admission medications   Medication Sig Start Date End Date Taking? Authorizing Provider  Ascorbic Acid (VITAMIN C) 1000 MG tablet Take 5,000 mg by mouth every morning.    Yes [provider]  ASHWAGANDHA PO Take 1 capsule by mouth daily.    Yes [provider]  B Complex-C (B-COMPLEX WITH VITAMIN C) tablet Take 1 tablet by mouth daily.     Yes [provider]  BYSTOLIC 20 MG TABS TAKE 1 TABLET EVERY MORNING 06/11/19  Yes Rollene Rotunda, MD  Cholecalciferol (VITAMIN D PO) Take 5,000 Units by mouth daily.    Yes [provider]  cloNIDine (CATAPRES) 0.1 MG tablet Take 1 tablet (0.1 mg total) by mouth 2 (two) times daily. Patient taking differently: Take 0.5 mg by mouth daily as needed (high blood pressure).  06/11/19  Yes Rollene Rotunda, MD  co-enzyme Q-10 30 MG capsule Take 30 mg by mouth daily.   Yes [provider]  hydrALAZINE (APRESOLINE) 50 MG tablet TAKE 1 TABLET THREE TIMES A DAY Patient taking differently: Take 50 mg by mouth in the morning and at bedtime.  06/11/19  Yes Rollene Rotunda, MD  Moringa Oleifera (MORINGA PO) Take 1 packet by mouth daily.    Yes [provider]  MULTIPLE VITAMIN PO Take 1 tablet by mouth daily.    Yes [provider]  NATTOKINASE PO Take 1 tablet by mouth every evening. Helps blood flow   Yes [provider]  NON FORMULARY Chlorella:  Takes 15 capsules daily   Yes [provider]  OVER THE COUNTER MEDICATION Take 1 packet by mouth daily. Mushroom supplement   Yes [provider]  Probiotic Product (PROBIOTIC PO) Take 1 tablet by mouth daily. Bifido Beadlet 50+   Yes [provider]  SPIRULINA PO Take 1 teaspoon by mouth daily   Yes [provider]    Physical Exam: Vitals:   09/03/19 2115 09/03/19 2200 09/03/19 2304 09/03/19  2330  BP: (!) 185/104 (!) 175/108 (!) 175/110 (!) 178/118  Pulse: 78 78 74 76  Resp: (!) 23 (!) 21 16 (!) 22  Temp:      TempSrc:      SpO2: 98% 97% 99% 100%    Constitutional: NAD, calm, comfortable, obese female appearing younger than stated age sitting at 40 degree incline in bed Vitals:   09/03/19 2115 09/03/19 2200 09/03/19 2304 09/03/19 2330  BP: (!) 185/104 (!) 175/108 (!) 175/110 (!) 178/118  Pulse: 78 78 74 76  Resp: (!) 23 (!) 21 16 (!) 22  Temp:      TempSrc:      SpO2: 98% 97% 99% 100%   Eyes: PERRL, lids and conjunctivae normal ENMT: Mucous membranes are moist.  Neck: normal, supple Respiratory: clear to auscultation bilaterally, no wheezing, no crackles.  Normal respiratory effort.  Cardiovascular: Regular rate and rhythm, no murmurs / rubs / gallops. No extremity edema. 2+ pedal pulses.   Abdomen: no tenderness, no masses palpated. Bowel sounds positive.  Musculoskeletal: no clubbing / cyanosis. No joint deformity upper and lower extremities. Good ROM, no contractures. Normal muscle tone.  Skin: no rashes, lesions, ulcers. No induration Neurologic: CN 2-12 grossly intact. Sensation intact.  Intact finger-to-nose.  Intact heel-to-shin.  No facial asymmetry.  Strength 5/5 in all 4.  Psychiatric: Normal judgment and insight. Alert and oriented x 3. Normal mood.     Labs on Admission: I have personally reviewed following labs and imaging studies  CBC: Recent Labs  Lab 09/03/19 1900  WBC 9.0  NEUTROABS 6.0  HGB 15.0  HCT 45.8  MCV 96.8  PLT 187   Basic Metabolic Panel: Recent Labs  Lab 09/03/19 1900  NA 142  K 3.6  CL 105  CO2 27  GLUCOSE 101*  BUN 15  CREATININE 1.04*  CALCIUM 8.9   GFR: CrCl cannot be calculated (Unknown ideal weight.). Liver Function Tests: No results for input(s): AST, ALT, ALKPHOS, BILITOT, PROT, ALBUMIN in the last 168 hours. No results for input(s): LIPASE, AMYLASE in the last 168 hours. No results for input(s): AMMONIA  in the last 168 hours. Coagulation Profile: No results for input(s): INR, PROTIME in the last 168 hours. Cardiac Enzymes: No results for input(s): CKTOTAL, CKMB, CKMBINDEX, TROPONINI in the last 168 hours. BNP (last 3 results) No results for input(s): PROBNP in the last 8760 hours. HbA1C: No results for input(s): HGBA1C in the last 72 hours. CBG: No results for input(s): GLUCAP in the last 168 hours. Lipid Profile: No results for input(s): CHOL, HDL, LDLCALC, TRIG, CHOLHDL, LDLDIRECT in the last 72 hours. Thyroid Function Tests: No results for input(s): TSH, T4TOTAL, FREET4, T3FREE, THYROIDAB in the last 72 hours. Anemia Panel: No results for input(s): VITAMINB12, FOLATE, FERRITIN, TIBC, IRON, RETICCTPCT in the last 72 hours. Urine analysis:    Component Value Date/Time   COLORURINE COLORLESS (A) 09/03/2019 2126   APPEARANCEUR CLEAR 09/03/2019 2126   LABSPEC 1.004 (L) 09/03/2019 2126   PHURINE 7.0 09/03/2019 2126   GLUCOSEU NEGATIVE 09/03/2019 2126   HGBUR NEGATIVE 09/03/2019 2126   BILIRUBINUR NEGATIVE 09/03/2019 2126   BILIRUBINUR negative 01/18/2015 1446   KETONESUR 5 (A) 09/03/2019 2126   PROTEINUR NEGATIVE 09/03/2019 2126   UROBILINOGEN 0.2 01/24/2015 1736   NITRITE NEGATIVE 09/03/2019 2126   LEUKOCYTESUR SMALL (A) 09/03/2019 2126    Radiological Exams on Admission: CT Head Wo Contrast  Result Date: 09/03/2019 CLINICAL DATA:  Stroke suspected EXAM: CT HEAD WITHOUT CONTRAST TECHNIQUE: Contiguous axial images were obtained from the base of the skull through the vertex without intravenous contrast. COMPARISON:  None. FINDINGS: Brain: No acute intracranial hemorrhage. No focal mass lesion. No CT evidence of acute infarction. No midline shift or mass effect. No hydrocephalus. Basilar cisterns are patent. There are periventricular and subcortical white matter hypodensities. Vascular: No hyperdense vessel or unexpected calcification. Skull: Normal. Negative for fracture or focal  lesion. Sinuses/Orbits: Paranasal sinuses and mastoid air cells are clear. Orbits are clear. Other: None. IMPRESSION: 1. No acute intracranial findings. 2. White matter microvascular disease. Electronically Signed   By: Genevive Bi M.D.   On: 09/03/2019 20:28   MR Brain Wo Contrast (neuro protocol)  Result Date: 09/03/2019 CLINICAL DATA:  Right-sided weakness. EXAM: MRI HEAD WITHOUT CONTRAST TECHNIQUE: Multiplanar, multiecho pulse sequences of the brain and surrounding structures were obtained  without intravenous contrast. COMPARISON:  Head CT 09/03/2019 Brain MRI 09/28/2016 FINDINGS: Brain: Small acute infarct of the anterolateral left thalamus, adjacent to the posterior limb of the internal capsule. No acute hemorrhage. Early confluent hyperintense T2-weighted signal of the periventricular and deep white matter, most commonly due to chronic ischemic microangiopathy. Normal volume of CSF spaces. No chronic microhemorrhage. Normal midline structures. Vascular: Normal flow voids. Skull and upper cervical spine: Normal marrow signal. Sinuses/Orbits: Negative. Other: None. IMPRESSION: 1. Small acute infarct of the anterolateral left thalamus, adjacent to the posterior limb of the internal capsule. No acute hemorrhage or mass effect. 2. Findings of chronic ischemic microangiopathy. Electronically Signed   By: Deatra Robinson M.D.   On: 09/03/2019 22:55      Assessment/Plan  Acute infarct of the anterior lateral left thalamus Unable to obtain CTA head and neck since pt has contrast dye allergy but refuses premedication and does not want imaging done echocardiogram  start daily aspirin and atorvastatin Obtain A1c and lipids PT/OT/SLT Frequent neuro checks and keep on telemetry Allow for permissive hypertension with blood pressure treatment as needed only if systolic goes above 220  CAD Start aspirin  HTN Hold antihypertensives for permissive hypertension  DVT prophylaxis:.Lovenox Code Status:  Full Family Communication: Plan discussed with patient and son at bedside.  All questions and concerns were addressed. disposition Plan: Home with at least 2 midnight stays  Consults called:  Admission status: inpatient Status is: Inpatient  Remains inpatient appropriate because:Inpatient level of care appropriate due to severity of illness   Dispo: The patient is from: Home              Anticipated d/c is to: Home              Anticipated d/c date is: 3 days              Patient currently is not medically stable to d/c.         Anselm Jungling DO [facility]   If 7PM-7AM, please contact night-coverage [email or web address]   09/03/2019, 11:50 PM

## 2019-09-04 ENCOUNTER — Inpatient Hospital Stay (HOSPITAL_COMMUNITY): Payer: Medicare Other

## 2019-09-04 ENCOUNTER — Encounter (HOSPITAL_COMMUNITY): Payer: Self-pay | Admitting: Family Medicine

## 2019-09-04 LAB — LIPID PANEL
Cholesterol: 179 mg/dL (ref 0–200)
HDL: 63 mg/dL (ref >40)
LDL Cholesterol: 100 mg/dL — ABNORMAL HIGH (ref 0–99)
Total CHOL/HDL Ratio: 2.8 RATIO
Triglycerides: 80 mg/dL (ref <150)
VLDL: 16 mg/dL (ref 0–40)

## 2019-09-04 LAB — HIV ANTIBODY (ROUTINE TESTING W REFLEX): HIV Screen 4th Generation wRfx: NONREACTIVE

## 2019-09-04 MED ORDER — NEBIVOLOL HCL 5 MG PO TABS
5.0000 mg | ORAL_TABLET | Freq: Every day | ORAL | Status: DC
  Administered 2019-09-05: 5 mg via ORAL
  Filled 2019-09-04: qty 1

## 2019-09-04 MED ORDER — CLOPIDOGREL BISULFATE 75 MG PO TABS
300.0000 mg | ORAL_TABLET | Freq: Once | ORAL | Status: AC
  Administered 2019-09-05: 300 mg via ORAL
  Filled 2019-09-04: qty 4

## 2019-09-04 MED ORDER — HYDRALAZINE HCL 25 MG PO TABS
25.0000 mg | ORAL_TABLET | Freq: Four times a day (QID) | ORAL | Status: DC | PRN
  Administered 2019-09-04: 25 mg via ORAL
  Filled 2019-09-04: qty 1

## 2019-09-04 MED ORDER — PREDNISONE 50 MG PO TABS
50.0000 mg | ORAL_TABLET | Freq: Four times a day (QID) | ORAL | Status: DC
  Filled 2019-09-04: qty 1

## 2019-09-04 MED ORDER — DIPHENHYDRAMINE HCL 50 MG/ML IJ SOLN: 50.0000 mg | Freq: Once | INTRAMUSCULAR | Status: DC

## 2019-09-04 MED ORDER — DIPHENHYDRAMINE HCL 25 MG PO CAPS: 50.0000 mg | ORAL_CAPSULE | Freq: Once | ORAL | Status: DC

## 2019-09-04 MED ORDER — CLOPIDOGREL BISULFATE 75 MG PO TABS
75.0000 mg | ORAL_TABLET | Freq: Every day | ORAL | Status: DC
  Administered 2019-09-05 – 2019-09-06 (×2): 75 mg via ORAL
  Filled 2019-09-04 (×2): qty 1

## 2019-09-04 MED ORDER — HYDRALAZINE HCL 25 MG PO TABS
25.0000 mg | ORAL_TABLET | Freq: Four times a day (QID) | ORAL | Status: DC | PRN
  Administered 2019-09-05: 25 mg via ORAL
  Filled 2019-09-04: qty 1

## 2019-09-04 NOTE — Consult Note (Addendum)
Referring Physician: Dr Allena Katz    Reason for Consult: stroke  HPI: [name] is an 69 y.o. female with HTN, thyroid disease, sciatica, depression and prev stroke. On evening of 6/21 she presented to an urgent care with right side heaviness since sometime on 6/20. She was found to hypertensive emergency with BP of 201/109. She was sent to [facility] ER directly from the urgent care for further care. CTH neg. MRI showed small lacunar stroke in the left thalamus for which neurology was consulted. Today, she feels like the left side of her face and leg has improved and arm/hand/fingers are starting to have tingling. Otherwise no motor symptoms.She remains at [facility] ER due to no beds at [facility] currently.  Date last known well:09/02/19 Time last known well: not known exactly, maybe ~10p  tPA Given: no, far outside of time window  Past Medical History Past Medical History:  Diagnosis Date  . Arthritis   . Chicken pox   . Depression   . Diverticulitis   . Elevated urinary free cortisol level 03/25/2014   mild with low potasssium  endo consult   disc with patient    . Hypertension   . Sciatica   . Seasonal allergies   . Stroke ([facility]) 09/2008  . Thyroid disease     Surgical History Past Surgical History:  Procedure Laterality Date  . MINOR BREAST BIOPSY  1979  . WISDOM TOOTH EXTRACTION      Family History  Family History  Problem Relation Age of Onset  . Heart failure Mother   . Heart disease Mother   . Diabetes Mother   . Hypertension Mother   . Hypertension Father   . Diabetes Maternal Grandmother   . Hypertension Sister   . Hypertension Brother   . Cancer Brother        Prostate  . Hypertension Sister     Social History:   reports that she has never smoked. She has never used smokeless tobacco. She reports current alcohol use. She reports that she does not use drugs.  Allergies:  Allergies  Allergen Reactions  . Isovue [Iopamidol] Itching    Pt states she had itching after CT  in 2015.  Marland Kitchen Penicillins Swelling    Has patient had a PCN reaction causing immediate rash, facial/tongue/throat swelling, SOB or lightheadedness with hypotension: yes Has patient had a PCN reaction causing severe rash involving mucus membranes or skin necrosis: no Has patient had a PCN reaction that required hospitalization no Has patient had a PCN reaction occurring within the last 10 years: no If all of the above answers are "NO", then may proceed with Cephalosporin use.   . Sulfa Antibiotics Itching    Home Medications:  (Not in a hospital admission)   Hospital Medications .  stroke: mapping our early stages of recovery book   Does not apply Once  . aspirin  81 mg Oral Daily  . atorvastatin  80 mg Oral Daily  . diphenhydrAMINE  50 mg Oral Once   Or  . diphenhydrAMINE  50 mg Intravenous Once  . enoxaparin (LOVENOX) injection  40 mg Subcutaneous Q24H  . predniSONE  50 mg Oral Q6H    ROS:  History obtained from pt   General ROS: negative for - chills, fatigue, fever, night sweats, weight gain or weight loss Psychological ROS: negative for - behavioral disorder, hallucinations, memory difficulties, mood swings or suicidal ideation Ophthalmic ROS: negative for - blurry vision, double vision, eye pain or loss of vision  ENT ROS: negative for - epistaxis, nasal discharge, oral lesions, sore throat, tinnitus or vertigo Allergy and Immunology ROS: negative for - hives or itchy/watery eyes Hematological and Lymphatic ROS: negative for - bleeding problems, bruising or swollen lymph nodes Endocrine ROS: negative for - galactorrhea, hair pattern changes, polydipsia/polyuria or temperature intolerance Respiratory ROS: negative for - cough, hemoptysis, shortness of breath or wheezing Cardiovascular ROS: negative for - chest pain, dyspnea on exertion, edema or irregular heartbeat Gastrointestinal ROS: negative for - abdominal pain, diarrhea, hematemesis, nausea/vomiting or stool  incontinence Genito-Urinary ROS: negative for - dysuria, hematuria, incontinence or urinary frequency/urgency Musculoskeletal ROS: negative for - joint swelling or muscular weakness Neurological ROS: as noted in HPI Dermatological ROS: negative for rash and skin lesion changes   Physical Examination:  Vitals:   09/04/19 0600 09/04/19 0630 09/04/19 0715 09/04/19 0748  BP: (!) 151/100 (!) 174/113 (!) 177/110 (!) 162/88  Pulse: 66 73 71 70  Resp: (!) 21 (!) 26 20 (!) 24  Temp:      TempSrc:      SpO2: 99% 98% 98% 98%  Weight:        General: Appears well-developed, no acute distress Psych: Affect appropriate to situation Eyes: No scleral injection HENT: No OP obstrucion Head: Normocephalic.  Cardiovascular: Normal rate and regular rhythm.  Respiratory: Effort normal and breath sounds normal to anterior ascultation GI: Soft.  No distension. There is no tenderness.  Skin: WDI    Neurological Examination Mental Status: Alert, oriented, thought content appropriate.  Speech fluent without evidence of aphasia. Able to follow 3 step commands without difficulty. Cranial Nerves: II: Visual fields grossly normal,  III,IV, VI: ptosis not present, extra-ocular motions intact bilaterally, pupils equal, round, reactive to light and accommodation V,VII: smile symmetric, facial light touch sensation normal bilaterally VIII: hearing normal bilaterally IX,X: uvula rises symmetrically XI: bilateral shoulder shrug XII: midline tongue extension Motor: Right : Upper extremity   5/5    Left:     Upper extremity   5/5  Lower extremity   5/5     Lower extremity   5/5 Tone and bulk:normal tone throughout; no atrophy noted Sensory: Decreased to light touch on the right arm w/"tingling", face, seems that right leg is equal, yet she describes as still being "heavy".  Deep Tendon Reflexes: 2+ and symmetric throughout Plantars: normal Cerebellar: normal finger-to-nose, normal rapid alternating  movements and normal heel-to-shin test Gait: normal gait and station NIHSS 1a Level of Conscious.: 0 1b LOC Questions: 0 1c LOC Commands: 0 2 Best Gaze: 0 3 Visual: 0 4 Facial Palsy: 0 5a Motor Arm - Left:0 5b Motor Arm - Right: 0 6a Motor Leg - Left: 0 6b Motor Leg - Right: 0 7 Limb Ataxia: 0 8 Sensory: 1 9 Best Language: 0 10 Dysarthria: 0 11 Extinct. and Inatten.: 0 TOTAL: 1    LABORATORY STUDIES:  Basic Metabolic Panel: Recent Labs  Lab 09/03/19 1900  NA 142  K 3.6  CL 105  CO2 27  GLUCOSE 101*  BUN 15  CREATININE 1.04*  CALCIUM 8.9    Liver Function Tests: No results for input(s): AST, ALT, ALKPHOS, BILITOT, PROT, ALBUMIN in the last 168 hours. No results for input(s): LIPASE, AMYLASE in the last 168 hours. No results for input(s): AMMONIA in the last 168 hours.  CBC: Recent Labs  Lab 09/03/19 1900  WBC 9.0  NEUTROABS 6.0  HGB 15.0  HCT 45.8  MCV 96.8  PLT 187    Cardiac  Enzymes: No results for input(s): CKTOTAL, CKMB, CKMBINDEX, TROPONINI in the last 168 hours.  BNP: Invalid input(s): POCBNP  CBG: No results for input(s): GLUCAP in the last 168 hours.  Microbiology:   Coagulation Studies: No results for input(s): LABPROT, INR in the last 72 hours.  Urinalysis:  Recent Labs  Lab 09/03/19 2126  COLORURINE COLORLESS*  LABSPEC 1.004*  PHURINE 7.0  GLUCOSEU NEGATIVE  HGBUR NEGATIVE  BILIRUBINUR NEGATIVE  KETONESUR 5*  PROTEINUR NEGATIVE  NITRITE NEGATIVE  LEUKOCYTESUR SMALL*    Lipid Panel:     Component Value Date/Time   CHOL 179 09/04/2019 0520   TRIG 80 09/04/2019 0520   HDL 63 09/04/2019 0520   CHOLHDL 2.8 09/04/2019 0520   VLDL 16 09/04/2019 0520   LDLCALC 100 (H) 09/04/2019 0520    HgbA1C:  Lab Results  Component Value Date   HGBA1C 5.3 09/29/2016    Urine Drug Screen:      Component Value Date/Time   LABOPIA NONE DETECTED 09/28/2016 1614   COCAINSCRNUR NONE DETECTED 09/28/2016 1614   LABBENZ NONE  DETECTED 09/28/2016 1614   AMPHETMU NONE DETECTED 09/28/2016 1614   THCU NONE DETECTED 09/28/2016 1614   LABBARB NONE DETECTED 09/28/2016 1614     Alcohol Level:  No results for input(s): ETH in the last 168 hours.  Miscellaneous labs:  ECG - SR rate   BPM. (See cardiology reading for complete details)   IMAGING: CT Head Wo Contrast  Result Date: 09/03/2019 CLINICAL DATA:  Stroke suspected EXAM: CT HEAD WITHOUT CONTRAST TECHNIQUE: Contiguous axial images were obtained from the base of the skull through the vertex without intravenous contrast. COMPARISON:  None. FINDINGS: Brain: No acute intracranial hemorrhage. No focal mass lesion. No CT evidence of acute infarction. No midline shift or mass effect. No hydrocephalus. Basilar cisterns are patent. There are periventricular and subcortical white matter hypodensities. Vascular: No hyperdense vessel or unexpected calcification. Skull: Normal. Negative for fracture or focal lesion. Sinuses/Orbits: Paranasal sinuses and mastoid air cells are clear. Orbits are clear. Other: None. IMPRESSION: 1. No acute intracranial findings. 2. White matter microvascular disease. Electronically Signed   By: Suzy Bouchard M.D.   On: 09/03/2019 20:28   MR Brain Wo Contrast (neuro protocol)  Result Date: 09/03/2019 CLINICAL DATA:  Right-sided weakness. EXAM: MRI HEAD WITHOUT CONTRAST TECHNIQUE: Multiplanar, multiecho pulse sequences of the brain and surrounding structures were obtained without intravenous contrast. COMPARISON:  Head CT 09/03/2019 Brain MRI 09/28/2016 FINDINGS: Brain: Small acute infarct of the anterolateral left thalamus, adjacent to the posterior limb of the internal capsule. No acute hemorrhage. Early confluent hyperintense T2-weighted signal of the periventricular and deep white matter, most commonly due to chronic ischemic microangiopathy. Normal volume of CSF spaces. No chronic microhemorrhage. Normal midline structures. Vascular: Normal flow  voids. Skull and upper cervical spine: Normal marrow signal. Sinuses/Orbits: Negative. Other: None. IMPRESSION: 1. Small acute infarct of the anterolateral left thalamus, adjacent to the posterior limb of the internal capsule. No acute hemorrhage or mass effect. 2. Findings of chronic ischemic microangiopathy. Electronically Signed   By: Ulyses Jarred M.D.   On: 09/03/2019 22:55     Transthoracic Echocardiogram - pending  Assessment:  [name] is a 69 y.o. female with history of HTN, thyroid disease, sciatica, depression and prev stroke. On evening of 6/21 she presented to an urgent care with right side heaviness since sometime on 6/20. She was found to hypertensive emergency with BP of 201/109. She was sent to [facility] ER directly  from the urgent care for further care. CTH neg. MRI showed small lacunar stroke in the left thalamus for which neurology was consulted. Today, she feels like the left side of her face and leg has improved and arm/hand/fingers are starting to have tingling. Otherwise no motor symptoms.  Stroke Risk Factors: prev stroke, uncontrolled HTN, age over 61.  1. Small vessel stroke- left thalamus 2. HTN emergency- goal SBP is <160 3. Essential HTN, not well controlled. May need medication adjustment and better longitudinal f/u.    Plan:  HgbA1c, fasting lipid panel  CT Angiogram Head and Neck  PT consult, OT consult, Speech consult  Echocardiogram  Prophylactic therapy - Antiplatelet medication  Statin Therapy - LDL goal < 70  Risk factor modification  Telemetry monitoring  Frequent neuro checks  Fall Precautions  DVT prophelaxis  Huel Cote for diet from my exam, no need for SLP    Attending Neurologist's note to follow  Desiree Metzger-Cihelka, ARNP-C, ANVP-BC Pager: [phone]   I have seen the patient reviewed the above note.  On my exam, she has very mild right-sided weakness which she feels has improved some, but is still problematic.   Her MRI reveals a lacunar appearing thalamic stroke.  Echo and carotid Dopplers are pending.  Given ischemic stroke with NIHSS  < 5, will start dual anti-platelet therapy.   Ritta Slot, MD [facility] [phone]  If 7pm- 7am, please page neurology on call as listed in AMION.

## 2019-09-04 NOTE — CV Procedure (Signed)
Echo attempted at [facility] but patient wanted to finish breakfast. Will try again later.

## 2019-09-04 NOTE — ED Notes (Signed)
[facility] at beside 

## 2019-09-04 NOTE — Progress Notes (Signed)
Dr. Petra Kuba at bedside. Made aware of repeat BP 172/105 after giving PRN hydralazine. Patient asymptomatic. Also made aware of monitor room stating ST elevation has increased. Dr. Petra Kuba made aware, stated to also notify internal medicine provider. MD paged, awaiting response.

## 2019-09-04 NOTE — Progress Notes (Signed)
[facility] Progress Note  Patient: [name]    IDP:[id number]  DOA: 09/03/2019     Date of Service: the patient was seen and examined on 09/04/2019  Chief Complaint  Patient presents with   Hypertension   Brief hospital course: Past medical history of HTN, depression, history of CVA.  Patient presenting with right-sided weakness and numbness found to have small acute anterolateral left thalamus infarct. Currently plan is for the stroke work-up.  Assessment and Plan: 1.  Acute left thalamic stroke Per patient symptoms are somewhat improving. Unable to perform CT angiogram as the patient has contrast allergy and does not want any premedication. Echocardiogram ordered. MRA ordered. Hemoglobin A1c currently pending. LDL 100.  Continue statin.  Lipitor 80 mg daily. Patient not on any antiplatelet medication prior to admission.  Currently on 81 mg aspirin. PT OT and speech therapy consulted. Patient cleared swallowing evaluation is currently on cardiac diet. Initial blood pressure goal 220/120.  Currently blood pressure goal 160/100 per neurology.  2.  Hypertensive urgency Permissive hypertension in the setting of acute stroke. Currently blood pressure goal 160/100. Patient is on Bystolic 20 mg daily, will initiate 5 mg daily starting tomorrow. As needed hydralazine. On 50 mg 3 times daily hydralazine at home. Clonidine is prescribed 0.1 mg twice daily but patient is taking as needed only.  3. Obesity  Body mass index is 33.66 kg/m.   Diet: cardiac diet DVT Prophylaxis: Subcutaneous Heparin    Advance goals of care discussion: Full code  Family Communication: no family was present at bedside, at the time of interview.   Disposition:  Status is: Inpatient  Remains inpatient appropriate because:Ongoing diagnostic testing needed not appropriate for outpatient work up   Dispo: The patient is from: Home              Anticipated d/c is to: Home               Anticipated d/c date is: 1 day              Patient currently is not medically stable to d/c.  Subjective: reports mild improvement in symptoms. Still has facial numbness and right sided numbness. No fever or chills.   Physical Exam:  General: Appear in mild distress, no Rash; Oral Mucosa Clear, moist. no Abnormal Neck Mass Or lumps, Conjunctiva normal  Cardiovascular: S1 and S2 Present, no Murmur, Respiratory: good respiratory effort, Bilateral Air entry present and Clear to Auscultation, no Crackles, no wheezes Abdomen: Bowel Sound present, Soft and no tenderness Extremities: no Pedal edema, no calf tenderness Neurology: alert and oriented to time, place, and person affect appropriate.  Mental status AAOx3, speech normal, attention normal,  Cranial Nerves PERRL, EOM normal and present, facial sensation to light touch present, right facial droop Motor strength bilateral equal strength 5/5,  Sensation present to light touch left >right,  Reflexes present knee and biceps, babinski negative, Cerebellar test normal finger nose finger.but pt felt that she had difficulty getting it done well on right Gait not checked due to patient safety concerns  Vitals:   09/04/19 1555 09/04/19 1614 09/04/19 1726 09/04/19 1730  BP:  (!) 168/103 (!) 149/105 (!) 149/105  Pulse: 68 65  75  Resp:  20  18  Temp:      TempSrc:      SpO2: 97% 97%  100%  Weight:       No intake or output data in the 24 hours ending 09/04/19 1844 [employer]  09/04/19 0058  Weight: 86.2 kg    Data Reviewed: I have personally reviewed and interpreted daily labs, tele strips, imagings as discussed above. I reviewed all nursing notes, pharmacy notes, vitals, pertinent old records I have discussed plan of care as described above with RN and patient/family.  CBC: Recent Labs  Lab 09/03/19 1900  WBC 9.0  NEUTROABS 6.0  HGB 15.0  HCT 45.8  MCV 96.8  PLT 371   Basic Metabolic Panel: Recent Labs  Lab  09/03/19 1900  NA 142  K 3.6  CL 105  CO2 27  GLUCOSE 101*  BUN 15  CREATININE 1.04*  CALCIUM 8.9    Studies: CT Head Wo Contrast  Result Date: 09/03/2019 CLINICAL DATA:  Stroke suspected EXAM: CT HEAD WITHOUT CONTRAST TECHNIQUE: Contiguous axial images were obtained from the base of the skull through the vertex without intravenous contrast. COMPARISON:  None. FINDINGS: Brain: No acute intracranial hemorrhage. No focal mass lesion. No CT evidence of acute infarction. No midline shift or mass effect. No hydrocephalus. Basilar cisterns are patent. There are periventricular and subcortical white matter hypodensities. Vascular: No hyperdense vessel or unexpected calcification. Skull: Normal. Negative for fracture or focal lesion. Sinuses/Orbits: Paranasal sinuses and mastoid air cells are clear. Orbits are clear. Other: None. IMPRESSION: 1. No acute intracranial findings. 2. White matter microvascular disease. Electronically Signed   By: Suzy Bouchard M.D.   On: 09/03/2019 20:28   MR ANGIO HEAD WO CONTRAST  Result Date: 09/04/2019 CLINICAL DATA:  Stroke EXAM: MRA HEAD WITHOUT CONTRAST TECHNIQUE: Angiographic images of the Circle of Willis were obtained using MRA technique without intravenous contrast. COMPARISON:  MRI head 09/03/2019 FINDINGS: Internal carotid artery patent bilaterally without significant stenosis. Mild atherosclerotic disease on the left. Anterior and middle cerebral arteries patent bilaterally. Severe stenosis inferior division left MCA bifurcation. Moderate stenosis in the superior division of left MCA. Mild irregularity and M3 branches bilaterally. Anterior cerebral arteries patent bilaterally. Both vertebral arteries patent to the basilar. PICA patent bilaterally. Left AICA patent. Right AICA not visualized. Mild to moderate stenosis distal basilar. Posterior cerebral arteries patent bilaterally with P3 stenosis bilaterally. Negative for cerebral aneurysm. IMPRESSION: Negative  for large vessel occlusion. Intracranial atherosclerotic disease as above. Electronically Signed   By: Franchot Gallo M.D.   On: 09/04/2019 17:20   MR Brain Wo Contrast (neuro protocol)  Result Date: 09/03/2019 CLINICAL DATA:  Right-sided weakness. EXAM: MRI HEAD WITHOUT CONTRAST TECHNIQUE: Multiplanar, multiecho pulse sequences of the brain and surrounding structures were obtained without intravenous contrast. COMPARISON:  Head CT 09/03/2019 Brain MRI 09/28/2016 FINDINGS: Brain: Small acute infarct of the anterolateral left thalamus, adjacent to the posterior limb of the internal capsule. No acute hemorrhage. Early confluent hyperintense T2-weighted signal of the periventricular and deep white matter, most commonly due to chronic ischemic microangiopathy. Normal volume of CSF spaces. No chronic microhemorrhage. Normal midline structures. Vascular: Normal flow voids. Skull and upper cervical spine: Normal marrow signal. Sinuses/Orbits: Negative. Other: None. IMPRESSION: 1. Small acute infarct of the anterolateral left thalamus, adjacent to the posterior limb of the internal capsule. No acute hemorrhage or mass effect. 2. Findings of chronic ischemic microangiopathy. Electronically Signed   By: Ulyses Jarred M.D.   On: 09/03/2019 22:55    Scheduled Meds:   stroke: mapping our early stages of recovery book   Does not apply Once   aspirin  81 mg Oral Daily   atorvastatin  80 mg Oral Daily   enoxaparin (LOVENOX) injection  40 mg  Subcutaneous Q24H   Continuous Infusions: PRN Meds: acetaminophen **OR** acetaminophen (TYLENOL) oral liquid 160 mg/5 mL **OR** acetaminophen, hydrALAZINE, senna-docusate  Time spent: 35 minutes  Author: Lynden Oxford, MD [facility] 09/04/2019 6:44 PM  To reach On-call, see care teams to locate the attending and reach out via [email or web address]. Between 7PM-7AM, please contact night-coverage If you still have difficulty reaching the attending provider, please page the  [facility] (Director on Call) for [facility] on amion for assistance.

## 2019-09-04 NOTE — ED Notes (Signed)
Called [facility] to get ETA. Paperwork at nurses station. Per previous shift report has been called.

## 2019-09-04 NOTE — ED Notes (Signed)
Pt given [place] sandwich and water upon request

## 2019-09-04 NOTE — Progress Notes (Signed)
Patient arrived from [place]. BP 195/136. Denies any pain. Paged [facility] nurse to make aware of arrival . Patient oriented to unit and procedures. PRN hydralazine to be administered. Will monitor monitor effectiveness.

## 2019-09-04 NOTE — ED Notes (Signed)
Per CT Pt refusing contrast

## 2019-09-04 NOTE — ED Notes (Signed)
Pt stated that she refuses to get IV contrast, even with prednisone and benadryl to cancel out any reactions. Benita Gutter, DO, made aware. Pt refused the prednisone and the benadryl.

## 2019-09-04 NOTE — ED Notes (Signed)
[facility] at bedside. Paperwork given.

## 2019-09-05 ENCOUNTER — Inpatient Hospital Stay (HOSPITAL_COMMUNITY): Payer: Medicare Other

## 2019-09-05 DIAGNOSIS — I639 Cerebral infarction, unspecified: Secondary | ICD-10-CM

## 2019-09-05 DIAGNOSIS — I351 Nonrheumatic aortic (valve) insufficiency: Secondary | ICD-10-CM

## 2019-09-05 LAB — COMPREHENSIVE METABOLIC PANEL
ALT: 17 U/L (ref 0–44)
AST: 24 U/L (ref 15–41)
Albumin: 3.4 g/dL — ABNORMAL LOW (ref 3.5–5.0)
Alkaline Phosphatase: 55 U/L (ref 38–126)
Anion gap: 9 (ref 5–15)
BUN: 17 mg/dL (ref 8–23)
CO2: 25 mmol/L (ref 22–32)
Calcium: 8.8 mg/dL — ABNORMAL LOW (ref 8.9–10.3)
Chloride: 106 mmol/L (ref 98–111)
Creatinine, Ser: 1.18 mg/dL — ABNORMAL HIGH (ref 0.44–1.00)
GFR calc Af Amer: 55 mL/min — ABNORMAL LOW (ref >60)
GFR calc non Af Amer: 47 mL/min — ABNORMAL LOW (ref >60)
Glucose, Bld: 100 mg/dL — ABNORMAL HIGH (ref 70–99)
Potassium: 3.1 mmol/L — ABNORMAL LOW (ref 3.5–5.1)
Sodium: 140 mmol/L (ref 135–145)
Total Bilirubin: 1.8 mg/dL — ABNORMAL HIGH (ref 0.3–1.2)
Total Protein: 6.4 g/dL — ABNORMAL LOW (ref 6.5–8.1)

## 2019-09-05 LAB — CBC WITH DIFFERENTIAL/PLATELET
Abs Immature Granulocytes: 0.02 10*3/uL (ref 0.00–0.07)
Basophils Absolute: 0.1 10*3/uL (ref 0.0–0.1)
Basophils Relative: 1 %
Eosinophils Absolute: 0.3 10*3/uL (ref 0.0–0.5)
Eosinophils Relative: 3 %
HCT: 42.9 % (ref 36.0–46.0)
Hemoglobin: 14.2 g/dL (ref 12.0–15.0)
Immature Granulocytes: 0 %
Lymphocytes Relative: 33 %
Lymphs Abs: 2.7 10*3/uL (ref 0.7–4.0)
MCH: 31.6 pg (ref 26.0–34.0)
MCHC: 33.1 g/dL (ref 30.0–36.0)
MCV: 95.3 fL (ref 80.0–100.0)
Monocytes Absolute: 0.7 10*3/uL (ref 0.1–1.0)
Monocytes Relative: 9 %
Neutro Abs: 4.4 10*3/uL (ref 1.7–7.7)
Neutrophils Relative %: 54 %
Platelets: 171 10*3/uL (ref 150–400)
RBC: 4.5 MIL/uL (ref 3.87–5.11)
RDW: 13.2 % (ref 11.5–15.5)
WBC: 8.2 10*3/uL (ref 4.0–10.5)
nRBC: 0 % (ref 0.0–0.2)

## 2019-09-05 LAB — ECHOCARDIOGRAM COMPLETE
Height: 64 in
Weight: 3033.53 oz

## 2019-09-05 LAB — HEMOGLOBIN A1C
Hgb A1c MFr Bld: 5.4 % (ref 4.8–5.6)
Mean Plasma Glucose: 108 mg/dL

## 2019-09-05 LAB — MAGNESIUM: Magnesium: 2.2 mg/dL (ref 1.7–2.4)

## 2019-09-05 MED ORDER — LABETALOL HCL 5 MG/ML IV SOLN: 10.0000 mg | INTRAVENOUS | Status: DC | PRN

## 2019-09-05 MED ORDER — POTASSIUM CHLORIDE CRYS ER 20 MEQ PO TBCR
40.0000 meq | EXTENDED_RELEASE_TABLET | ORAL | Status: AC
  Administered 2019-09-05 (×2): 40 meq via ORAL
  Filled 2019-09-05 (×2): qty 2

## 2019-09-05 MED ORDER — CLONIDINE HCL 0.1 MG PO TABS
0.1000 mg | ORAL_TABLET | Freq: Two times a day (BID) | ORAL | Status: DC
  Administered 2019-09-05 – 2019-09-06 (×2): 0.1 mg via ORAL
  Filled 2019-09-05 (×2): qty 1

## 2019-09-05 MED ORDER — NEBIVOLOL HCL 10 MG PO TABS
20.0000 mg | ORAL_TABLET | Freq: Every morning | ORAL | Status: DC
  Administered 2019-09-06: 20 mg via ORAL
  Filled 2019-09-05: qty 2

## 2019-09-05 MED ORDER — ATORVASTATIN CALCIUM 40 MG PO TABS
40.0000 mg | ORAL_TABLET | Freq: Every day | ORAL | Status: DC
  Administered 2019-09-06: 40 mg via ORAL
  Filled 2019-09-05: qty 1

## 2019-09-05 MED ORDER — HYDRALAZINE HCL 50 MG PO TABS
50.0000 mg | ORAL_TABLET | Freq: Three times a day (TID) | ORAL | Status: DC
  Administered 2019-09-05 – 2019-09-06 (×3): 50 mg via ORAL
  Filled 2019-09-05 (×3): qty 1

## 2019-09-05 NOTE — Evaluation (Signed)
Occupational Therapy Evaluation Patient Details Name: [name] MRN: [id number] DOB: [birth year] Today's Date: 09/05/2019    History of Present Illness [name] is a 69 y.o. female with medical history significant forHistory of CVA, hypertension, CAD, left LBBB who presents with concerns of left sided heaviness and hypertension.MRI: Small acute infarct of the anterolateral left thalamus, adjacent to the posterior limb of the internal capsule.   Clinical Impression   This 69 yo female admitted with above presents to acute OT with PLOF of being totally independent with all basic and IADLs as well as driving. She currently is min guard A -S for basic ADLs with mild weakness in right side. She will continue to benefit from acute OT without need for follow up.    Follow Up Recommendations  No OT follow up;Supervision - Intermittent    Equipment Recommendations  None recommended by OT       Precautions / Restrictions Precautions Precautions: Fall Restrictions Weight Bearing Restrictions: No      Mobility Bed Mobility Overal bed mobility: Independent                Transfers Overall transfer level: Needs assistance Equipment used: None Transfers: Sit to/from Stand Sit to Stand: Min guard              Balance Overall balance assessment: Mild deficits observed, not formally tested                                         ADL either performed or assessed with clinical judgement   ADL Overall ADL's : Needs assistance/impaired Eating/Feeding: Independent   Grooming: Supervision/safety;Set up;Standing   Upper Body Bathing: Supervision/ safety;Set up;Sitting   Lower Body Bathing: Min guard;Sit to/from stand   Upper Body Dressing : Supervision/safety;Set up;Sitting   Lower Body Dressing: Min guard;Sit to/from stand   Toilet Transfer: Min guard;Ambulation;Grab bars;Regular Museum/gallery exhibitions officer and  Hygiene: Min guard;Sit to/from stand               Vision Patient Visual Report: No change from baseline              Pertinent Vitals/Pain Pain Assessment: No/denies pain     Hand Dominance Right   Extremity/Trunk Assessment Upper Extremity Assessment Upper Extremity Assessment: RUE deficits/detail RUE Deficits / Details: Still feels a little off from normal per patient report (but better than on admission)        Proprioception intact as well as light touch   Communication Communication Communication: No difficulties   Cognition Arousal/Alertness: Awake/alert Behavior During Therapy: WFL for tasks assessed/performed Overall Cognitive Status: Within Functional Limits for tasks assessed                                                Home Living Family/patient expects to be discharged to:: Private residence Living Arrangements: Children (son) Available Help at Discharge: Family;Available 24 hours/day Type of Home: House Home Access: Stairs to enter [employer] of Steps: 8 Entrance Stairs-Rails: Can reach both Home Layout: Two level;Bed/bath upstairs Alternate Level Stairs-Number of Steps: stairs landing stairs Alternate Level Stairs-Rails: Right (going up) Bathroom Shower/Tub: Walk-in shower;Door   Bathroom Toilet: Standard Bathroom Accessibility: No   Home Equipment: None  Lives With: Son    Prior Functioning/Environment Level of Independence: Independent                 OT Problem List: Impaired balance (sitting and/or standing);Decreased coordination      OT Treatment/Interventions: Self-care/ADL training;DME and/or AE instruction;Patient/family education;Balance training;Therapeutic activities    OT Goals(Current goals can be found in the care plan section) Acute Rehab OT Goals OT Goal Formulation: With patient Time For Goal Achievement: 09/19/19 Potential to Achieve Goals: Good  OT Frequency: Min  2X/week              AM-PAC OT "6 Clicks" Daily Activity     Outcome Measure Help from another person eating meals?: None Help from another person taking care of personal grooming?: A Little Help from another person toileting, which includes using toliet, bedpan, or urinal?: A Little Help from another person bathing (including washing, rinsing, drying)?: A Little Help from another person to put on and taking off regular upper body clothing?: A Little Help from another person to put on and taking off regular lower body clothing?: A Little 6 Click Score: 19   End of Session Equipment Utilized During Treatment: Gait belt  Activity Tolerance: Patient tolerated treatment well Patient left:  (with PT getting ready to ambulate)  OT Visit Diagnosis: Other abnormalities of gait and mobility (R26.89);Muscle weakness (generalized) (M62.81)                Time: 1275-1700 OT Time Calculation (min): 25 min Charges:  OT General Charges $OT Visit: 1 Visit OT Evaluation $OT Eval Moderate Complexity: 1 Mod OT Treatments $Self Care/Home Management : 8-22 mins  [name], OTR/L Acute [employer] Pager [phone] Office [phone]     [name] 09/05/2019, 12:57 PM

## 2019-09-05 NOTE — Progress Notes (Signed)
PROGRESS NOTE    [name]  JOA:[id number] DOB: [birth year] DOA: 09/03/2019 PCP: Burnis Medin, MD   Brief Narrative: Patient is a 69 year old female with history of hypertension, depression, nonhemorrhagic CVA who presented with right-sided weakness, numbness.  MRI of the brain showed small acute anterolateral left thalamic infarct.  Neurology consulted.  Admitted for stroke work-up.   Assessment & Plan:   Principal Problem:   Stroke [facility]) Active Problems:   Essential hypertension   CAD (coronary artery disease)   Acute left thalamic stroke: Presented with right-sided weakness, numbness.  MRI showed a small acute anterior lateral left thalamic infarct.  MRI negative for large vessel occlusion. Pending echocardiogram.  Carotid Doppler did not show any significant stenosis Hemoglobins of 5.4.  LDL of 100. Patient was not on any antiplatelet medications prior to admission.  Started on aspirin 81 mg daily.  PT/OT/speech evaluation pending. She does not have any focal neurological deficits at present  Hypertensive urgency: Currently hypertensive.  Allow permissive hypertension.  Gradually normalize blood pressure in 3 to 5 days.  Continue as needed medications for severe hypertension.  Takes Bystolic, hydralazine, clonidine at home.  AKI versus CKD stage II: Continue to monitor kidney function.  However creatinine fluctuates from 1-1.2 as per previous blood works  Hypokalemia: Being supplemented with potassium.  Obesity: BMI of 33.6         DVT prophylaxis:Heparin [place] Code Status: Full Family Communication: called son on phone today  status is: Inpatient  Remains inpatient appropriate because:Ongoing diagnostic testing needed not appropriate for outpatient work up   Dispo: The patient is from: Home              Anticipated d/c is to: Home              Anticipated d/c date is: 1 day              Patient currently is not medically stable to  d/c.     Consultants: neurology   Procedures:None  Antimicrobials:  Anti-infectives (From admission, onward)   None      Subjective:  Patient seen and examined the bedside this morning.  Hemodynamically stable.  Comfortable.  Sitting on the bed.  Denies any weakness.  Speech is clear.  Waiting for PT/OT evaluation. Objective: Vitals:   09/04/19 2244 09/04/19 2326 09/05/19 0345 09/05/19 0754  BP: (!) 172/105 (!) 179/95 (!) 166/113 (!) 171/105  Pulse:  64 60 63  Resp:  17 17 18   Temp:  98.4 F (36.9 C) 98.6 F (37 C) 98.8 F (37.1 C)  TempSrc:  Oral  Oral  SpO2:  92% 97% 98%  Weight:      Height:        Intake/Output Summary (Last 24 hours) at 09/05/2019 6010 Last data filed at 09/04/2019 2130 Gross per 24 hour  Intake 240 ml  Output --  Net 240 ml   Filed Weights   09/04/19 0058 09/04/19 2111  Weight: 86.2 kg 86 kg    Examination:  General exam: Appears calm and comfortable ,Not in distress,obese, pleasant female HEENT:PERRL,Oral mucosa moist, Ear/Nose normal on gross exam Respiratory system: Bilateral equal air entry, normal vesicular breath sounds, no wheezes or crackles  Cardiovascular system: S1 & S2 heard, RRR. No JVD, murmurs, rubs, gallops or clicks. No pedal edema. Gastrointestinal system: Abdomen is nondistended, soft and nontender. No organomegaly or masses felt. Normal bowel sounds heard. Central nervous system: Alert and oriented. No focal neurological deficits. Extremities: No edema,  no clubbing ,no cyanosis, distal peripheral pulses palpable. Skin: No rashes, lesions or ulcers,no icterus ,no pallor  Data Reviewed: I have personally reviewed following labs and imaging studies  CBC: Recent Labs  Lab 09/03/19 1900 09/05/19 0415  WBC 9.0 8.2  NEUTROABS 6.0 4.4  HGB 15.0 14.2  HCT 45.8 42.9  MCV 96.8 95.3  PLT 187 171   Basic Metabolic Panel: Recent Labs  Lab 09/03/19 1900 09/05/19 0415  NA 142 140  K 3.6 3.1*  CL 105 106  CO2 27 25   GLUCOSE 101* 100*  BUN 15 17  CREATININE 1.04* 1.18*  CALCIUM 8.9 8.8*  MG  --  2.2   GFR: Estimated Creatinine Clearance: 48.4 mL/min (A) (by C-G formula based on SCr of 1.18 mg/dL (H)). Liver Function Tests: Recent Labs  Lab 09/05/19 0415  AST 24  ALT 17  ALKPHOS 55  BILITOT 1.8*  PROT 6.4*  ALBUMIN 3.4*   No results for input(s): LIPASE, AMYLASE in the last 168 hours. No results for input(s): AMMONIA in the last 168 hours. Coagulation Profile: No results for input(s): INR, PROTIME in the last 168 hours. Cardiac Enzymes: No results for input(s): CKTOTAL, CKMB, CKMBINDEX, TROPONINI in the last 168 hours. BNP (last 3 results) No results for input(s): PROBNP in the last 8760 hours. HbA1C: Recent Labs    09/04/19 0520  HGBA1C 5.4   CBG: No results for input(s): GLUCAP in the last 168 hours. Lipid Profile: Recent Labs    09/04/19 0520  CHOL 179  HDL 63  LDLCALC 100*  TRIG 80  CHOLHDL 2.8   Thyroid Function Tests: No results for input(s): TSH, T4TOTAL, FREET4, T3FREE, THYROIDAB in the last 72 hours. Anemia Panel: No results for input(s): VITAMINB12, FOLATE, FERRITIN, TIBC, IRON, RETICCTPCT in the last 72 hours. Sepsis Labs: No results for input(s): PROCALCITON, LATICACIDVEN in the last 168 hours.  Recent Results (from the past 240 hour(s))  SARS Coronavirus 2 by RT PCR (hospital order, performed in [facility] hospital lab) Nasopharyngeal Nasopharyngeal Swab     Status: None   Collection Time: 09/03/19  9:27 PM   Specimen: Nasopharyngeal Swab  Result Value Ref Range Status   SARS Coronavirus 2 NEGATIVE NEGATIVE Final    Comment: (NOTE) SARS-CoV-2 target nucleic acids are NOT DETECTED.  The SARS-CoV-2 RNA is generally detectable in upper and lower respiratory specimens during the acute phase of infection. The lowest concentration of SARS-CoV-2 viral copies this assay can detect is 250 copies / mL. A negative result does not preclude SARS-CoV-2  infection and should not be used as the sole basis for treatment or other patient management decisions.  A negative result may occur with improper specimen collection / handling, submission of specimen other than nasopharyngeal swab, presence of viral mutation(s) within the areas targeted by this assay, and inadequate number of viral copies (<250 copies / mL). A negative result must be combined with clinical observations, patient history, and epidemiological information.  Fact Sheet for Patients:   BoilerBrush.com.cy  Fact Sheet for Healthcare Providers: https://pope.com/  This test is not yet approved or  cleared by the [place] FDA and has been authorized for detection and/or diagnosis of SARS-CoV-2 by FDA under an Emergency Use Authorization (EUA).  This EUA will remain in effect (meaning this test can be used) for the duration of the COVID-19 declaration under Section 564(b)(1) of the Act, 21 U.S.C. section 360bbb-3(b)(1), unless the authorization is terminated or revoked sooner.  Performed at [facility], [address], [place], [place] [address]          Radiology Studies: CT Head Wo Contrast  Result Date: 09/03/2019 CLINICAL DATA:  Stroke suspected EXAM: CT HEAD WITHOUT CONTRAST TECHNIQUE: Contiguous axial images were obtained from the base of the skull through the vertex without intravenous contrast. COMPARISON:  None. FINDINGS: Brain: No acute intracranial hemorrhage. No focal mass lesion. No CT evidence of acute infarction. No midline shift or mass effect. No hydrocephalus. Basilar cisterns are patent. There are periventricular and subcortical white matter hypodensities. Vascular: No hyperdense vessel or unexpected calcification. Skull: Normal. Negative for fracture or focal lesion. Sinuses/Orbits: Paranasal sinuses and mastoid air cells are clear. Orbits are clear. Other: None. IMPRESSION: 1. No  acute intracranial findings. 2. White matter microvascular disease. Electronically Signed   By: Genevive Bi M.D.   On: 09/03/2019 20:28   MR ANGIO HEAD WO CONTRAST  Result Date: 09/04/2019 CLINICAL DATA:  Stroke EXAM: MRA HEAD WITHOUT CONTRAST TECHNIQUE: Angiographic images of the Circle of Willis were obtained using MRA technique without intravenous contrast. COMPARISON:  MRI head 09/03/2019 FINDINGS: Internal carotid artery patent bilaterally without significant stenosis. Mild atherosclerotic disease on the left. Anterior and middle cerebral arteries patent bilaterally. Severe stenosis inferior division left MCA bifurcation. Moderate stenosis in the superior division of left MCA. Mild irregularity and M3 branches bilaterally. Anterior cerebral arteries patent bilaterally. Both vertebral arteries patent to the basilar. PICA patent bilaterally. Left AICA patent. Right AICA not visualized. Mild to moderate stenosis distal basilar. Posterior cerebral arteries patent bilaterally with P3 stenosis bilaterally. Negative for cerebral aneurysm. IMPRESSION: Negative for large vessel occlusion. Intracranial atherosclerotic disease as above. Electronically Signed   By: Marlan Palau M.D.   On: 09/04/2019 17:20   MR Brain Wo Contrast (neuro protocol)  Result Date: 09/03/2019 CLINICAL DATA:  Right-sided weakness. EXAM: MRI HEAD WITHOUT CONTRAST TECHNIQUE: Multiplanar, multiecho pulse sequences of the brain and surrounding structures were obtained without intravenous contrast. COMPARISON:  Head CT 09/03/2019 Brain MRI 09/28/2016 FINDINGS: Brain: Small acute infarct of the anterolateral left thalamus, adjacent to the posterior limb of the internal capsule. No acute hemorrhage. Early confluent hyperintense T2-weighted signal of the periventricular and deep white matter, most commonly due to chronic ischemic microangiopathy. Normal volume of CSF spaces. No chronic microhemorrhage. Normal midline structures. Vascular:  Normal flow voids. Skull and upper cervical spine: Normal marrow signal. Sinuses/Orbits: Negative. Other: None. IMPRESSION: 1. Small acute infarct of the anterolateral left thalamus, adjacent to the posterior limb of the internal capsule. No acute hemorrhage or mass effect. 2. Findings of chronic ischemic microangiopathy. Electronically Signed   By: Deatra Robinson M.D.   On: 09/03/2019 22:55        Scheduled Meds: .  stroke: mapping our early stages of recovery book   Does not apply Once  . aspirin  81 mg Oral Daily  . atorvastatin  80 mg Oral Daily  . clopidogrel  75 mg Oral Daily  . enoxaparin (LOVENOX) injection  40 mg Subcutaneous Q24H  . nebivolol  5 mg Oral Daily   Continuous Infusions:   LOS: 2 days    Time spent: 35 mins.More than 50% of that time was spent in counseling and/or coordination of care.      Burnadette Pop, MD [facility] P6/23/2021, 8:42 AM

## 2019-09-05 NOTE — Progress Notes (Addendum)
STROKE TEAM PROGRESS NOTE   INTERVAL HISTORY She presented with right-sided heaviness and MRI scan shows a left thalamic lacunar infarct.  LDL cholesterol 100 mg percent.  Hemoglobin A1c is 5.4.  I personally reviewed history of presenting illness with the patient, electronic medical records and imaging films in PACS.  Vitals:   09/04/19 2326 09/05/19 0345 09/05/19 0754 09/05/19 1000  BP: (!) 179/95 (!) 166/113 (!) 171/105 (!) 168/100  Pulse: 64 60 63   Resp: 17 17 18    Temp: 98.4 F (36.9 C) 98.6 F (37 C) 98.8 F (37.1 C)   TempSrc: Oral  Oral   SpO2: 92% 97% 98%   Weight:      Height:        CBC:  Recent Labs  Lab 09/03/19 1900 09/05/19 0415  WBC 9.0 8.2  NEUTROABS 6.0 4.4  HGB 15.0 14.2  HCT 45.8 42.9  MCV 96.8 95.3  PLT 187 132    Basic Metabolic Panel:  Recent Labs  Lab 09/03/19 1900 09/05/19 0415  NA 142 140  K 3.6 3.1*  CL 105 106  CO2 27 25  GLUCOSE 101* 100*  BUN 15 17  CREATININE 1.04* 1.18*  CALCIUM 8.9 8.8*  MG  --  2.2   Lipid Panel:     Component Value Date/Time   CHOL 179 09/04/2019 0520   TRIG 80 09/04/2019 0520   HDL 63 09/04/2019 0520   CHOLHDL 2.8 09/04/2019 0520   VLDL 16 09/04/2019 0520   LDLCALC 100 (H) 09/04/2019 0520   HgbA1c:  Lab Results  Component Value Date   HGBA1C 5.4 09/04/2019   Urine Drug Screen:     Component Value Date/Time   LABOPIA NONE DETECTED 09/28/2016 1614   COCAINSCRNUR NONE DETECTED 09/28/2016 1614   LABBENZ NONE DETECTED 09/28/2016 1614   AMPHETMU NONE DETECTED 09/28/2016 1614   THCU NONE DETECTED 09/28/2016 1614   LABBARB NONE DETECTED 09/28/2016 1614    Alcohol Level No results found for: ETH  IMAGING past 24 hours MR ANGIO HEAD WO CONTRAST  Result Date: 09/04/2019 CLINICAL DATA:  Stroke EXAM: MRA HEAD WITHOUT CONTRAST TECHNIQUE: Angiographic images of the Circle of Willis were obtained using MRA technique without intravenous contrast. COMPARISON:  MRI head 09/03/2019 FINDINGS: Internal  carotid artery patent bilaterally without significant stenosis. Mild atherosclerotic disease on the left. Anterior and middle cerebral arteries patent bilaterally. Severe stenosis inferior division left MCA bifurcation. Moderate stenosis in the superior division of left MCA. Mild irregularity and M3 branches bilaterally. Anterior cerebral arteries patent bilaterally. Both vertebral arteries patent to the basilar. PICA patent bilaterally. Left AICA patent. Right AICA not visualized. Mild to moderate stenosis distal basilar. Posterior cerebral arteries patent bilaterally with P3 stenosis bilaterally. Negative for cerebral aneurysm. IMPRESSION: Negative for large vessel occlusion. Intracranial atherosclerotic disease as above. Electronically Signed   By: Franchot Gallo M.D.   On: 09/04/2019 17:20   VAS US CAROTID  Result Date: 09/05/2019 Carotid Arterial Duplex Study Indications:       CVA. Risk Factors:      Hypertension. Other Factors:     Small acute infarct of the anterolateral left thalamus. Comparison Study:  09/29/16 Performing Technologist: June Leap RDMS, RVT  Examination Guidelines: A complete evaluation includes B-mode imaging, spectral Doppler, color Doppler, and power Doppler as needed of all accessible portions of each vessel. Bilateral testing is considered an integral part of a complete examination. Limited examinations for reoccurring indications may be performed as noted.  Right Carotid Findings: +----------+--------+--------+--------+------------------+--------+  PSV cm/sEDV cm/sStenosisPlaque DescriptionComments +----------+--------+--------+--------+------------------+--------+ CCA Prox  73      18                                         +----------+--------+--------+--------+------------------+--------+ CCA Distal87      24                                         +----------+--------+--------+--------+------------------+--------+ ICA Prox  50      16       1-39%   hypoechoic                 +----------+--------+--------+--------+------------------+--------+ ICA Distal92      30                                         +----------+--------+--------+--------+------------------+--------+ ECA       46      13                                         +----------+--------+--------+--------+------------------+--------+ +----------+--------+-------+----------------+-------------------+           PSV cm/sEDV cmsDescribe        Arm Pressure (mmHG) +----------+--------+-------+----------------+-------------------+ [id number]            Multiphasic, WNL                    +----------+--------+-------+----------------+-------------------+ +---------+--------+--+--------+--+---------+ VertebralPSV cm/s37EDV cm/s16Antegrade +---------+--------+--+--------+--+---------+  Left Carotid Findings: +----------+--------+--------+--------+------------------+--------+           PSV cm/sEDV cm/sStenosisPlaque DescriptionComments +----------+--------+--------+--------+------------------+--------+ CCA Prox  73      24                                         +----------+--------+--------+--------+------------------+--------+ CCA Distal50      17                                         +----------+--------+--------+--------+------------------+--------+ ICA Prox  39      20      1-39%   heterogenous               +----------+--------+--------+--------+------------------+--------+ ICA Distal74      35                                         +----------+--------+--------+--------+------------------+--------+ ECA       55      11                                         +----------+--------+--------+--------+------------------+--------+ +----------+--------+--------+----------------+-------------------+           PSV cm/sEDV cm/sDescribe        Arm Pressure (mmHG)  +----------+--------+--------+----------------+-------------------+ [id number]  Multiphasic, WNL                    +----------+--------+--------+----------------+-------------------+ +---------+--------+--+--------+--+---------+ VertebralPSV cm/s37EDV cm/s12Antegrade +---------+--------+--+--------+--+---------+   Summary: Right Carotid: Velocities in the right ICA are consistent with a 1-39% stenosis. Left Carotid: Velocities in the left ICA are consistent with a 1-39% stenosis.  *See table(s) above for measurements and observations.     Preliminary     PHYSICAL EXAM Obese middle-aged African-American lady not in distress . Afebrile. Head is nontraumatic. Neck is supple without bruit.    Cardiac exam no murmur or gallop. Lungs are clear to auscultation. Distal pulses are well felt. Neurological Exam ;  Awake  Alert oriented x 3. Normal speech and language.eye movements full without nystagmus.fundi were not visualized. Vision acuity and fields appear normal. Hearing is normal. Palatal movements are normal. Face symmetric. Tongue midline. Normal strength, tone, reflexes and coordination.  Slightly diminished right hemibody sensation. Gait deferred.  ASSESSMENT/PLAN [name] is a 69 y.o. female with history of HTN, HLD, stroke, hypothyroidism, depression presenting to [facility] ED from PCP with R sided heaviness.   Stroke:   Small L thalamic / PLIC infarct secondary to small vessel disease    CT head No acute abnormality. Small vessel disease.     MRI  Small L thalamic / PLIC internal capsule infarct   MRA  No LVO. Intracranial atherosclerosis   Carotid Doppler  B ICA 1-39% stenosis, VAs antegrade   2D Echo EF 50-55%. No source of embolus   LDL 100  HgbA1c 5.4  Lovenox 40 mg sq daily for VTE prophylaxis  No antithrombotic prior to admission, now on aspirin 81 mg daily and clopidogrel 75 mg daily. Continue DAPT x 3 weeks then aspirin  alone.   Therapy recommendations:  pending   Disposition:  .spb   Hypertension  Stable . Permissive hypertension (OK if < 220/120) but gradually normalize in 5-7 days . Long-term BP goal normotensive  Hyperlipidemia  Home meds:  No statin  Now on lipitor 80  LDL 100, goal < 70  Decrease Lipitor to 40  Continue statin at discharge  Other Stroke Risk Factors  Advanced age  Obesity, Body mass index is 32.54 kg/m., recommend weight loss, diet and exercise as appropriate   Hx stroke/TIA  2010 - Pt reported - intermittent right hand and right toe numbness tingling    Possible obstructive sleep apnea. Patient interested in participating in the [facility] trial. [facility] will follow up for possible enrollment. Please contact them at [phone] for any questions.   Other Active Problems  Thyroid disease  depression  Hospital day # 2 She presented with right body paresthesias from left thalamic infarct from small vessel disease.  Recommend dual antiplatelet therapy of aspirin and Plavix for 3 weeks followed by aspirin alone.  Statin.  Aggressive risk factor modification.  Patient may also consider possible participation in the sleep smart study if interested.  Greater than 50% time during this 35-minute visit was spent on counseling and coordination of care about her lacunar stroke and discussion about stroke prevention and treatment and answering questions.  Discussed with patient and Dr. Willia Craze.  Stroke team will sign off.  Kindly call for questions. Delia Heady, MD To contact Stroke Continuity provider, please refer to [email or web address]. After hours, contact General Neurology

## 2019-09-05 NOTE — Evaluation (Signed)
Physical Therapy Evaluation Patient Details Name: [name] MRN: [id number] DOB: 02/22/1951 Today's Date: 09/05/2019   History of Present Illness  [name] is a 69 y.o. female with medical history significant forHistory of CVA, hypertension, CAD, left LBBB who presents with concerns of right sided heaviness and hypertension.MRI: Small acute infarct of the anterolateral left thalamus, adjacent to the posterior limb of the internal capsule.  Clinical Impression  Pt admitted with/for R sided heaviness and found to have infarct in the left thalamus.  Pt needing min guard to mod I for basic mobility and gait.Marland Kitchen  Pt currently limited functionally due to the problems listed below.  (see problems list.)  Pt will benefit from PT to maximize function and safety to be able to get home safely with available assist.     Follow Up Recommendations Home health PT (working toward NO FOLLOW UP )    Equipment Recommendations  Other (comment) (TBA)    Recommendations for Other Services       Precautions / Restrictions Precautions Precautions: Fall Restrictions Weight Bearing Restrictions: No      Mobility  Bed Mobility Overal bed mobility: Independent                Transfers Overall transfer level: Needs assistance Equipment used: None Transfers: Sit to/from Stand Sit to Stand: Min guard            Ambulation/Gait Ambulation/Gait assistance: Min guard Gait Distance (Feet): 300 Feet Assistive device: Rolling walker (2 wheeled) Gait Pattern/deviations: Step-through pattern Gait velocity: slower Gait velocity interpretation: <1.8 ft/sec, indicate of risk for recurrent falls General Gait Details: shorter, guarded steps, with heel/toe pattern degrading mildly with light shuffle as distance and time up increased.  Stairs Stairs: Yes Stairs assistance: Min guard Stair Management: One rail Right;Alternating pattern;Step to pattern;Forwards Number of Stairs:  10 General stair comments: tended to stump toes on the R when advancing up steps.  Otherwise, not a strength issue, mostly coordination'  Wheelchair Mobility    Modified Rankin (Stroke Patients Only) Modified Rankin (Stroke Patients Only) Pre-Morbid Rankin Score: No symptoms Modified Rankin: Moderate disability     Balance Overall balance assessment: Mild deficits observed, not formally tested                                           Pertinent Vitals/Pain Pain Assessment: No/denies pain    Home Living Family/patient expects to be discharged to:: Private residence Living Arrangements: Children Available Help at Discharge: Family;Available 24 hours/day Type of Home: House Home Access: Stairs to enter Entrance Stairs-Rails: Can reach both Entrance Stairs-Number of Steps: 8 Home Layout: Two level;Bed/bath upstairs Home Equipment: None      Prior Function Level of Independence: Independent               Hand Dominance   Dominant Hand: Right    Extremity/Trunk Assessment   Upper Extremity Assessment Upper Extremity Assessment: RUE deficits/detail RUE Deficits / Details: Still feels a little off from normal per patient report (but better than on admission)    Lower Extremity Assessment Lower Extremity Assessment: RLE deficits/detail RLE Deficits / Details: heaviness, generally >4/5,  decreased coordination with muscular fatigue        Communication   Communication: No difficulties  Cognition Arousal/Alertness: Awake/alert Behavior During Therapy: WFL for tasks assessed/performed Overall Cognitive Status: Within Functional Limits for tasks assessed  General Comments      Exercises     Assessment/Plan    PT Assessment Patient needs continued PT services  PT Problem List Decreased strength;Decreased range of motion;Decreased balance;Decreased mobility;Decreased  coordination;Decreased knowledge of use of DME       PT Treatment Interventions Gait training;Functional mobility training;Therapeutic activities;Balance training;Patient/family education;DME instruction    PT Goals (Current goals can be found in the Care Plan section)  Acute Rehab PT Goals Patient Stated Goal: go home PT Goal Formulation: With patient Time For Goal Achievement: 09/12/19 Potential to Achieve Goals: Good    Frequency Min 3X/week   Barriers to discharge        Co-evaluation               AM-PAC PT "6 Clicks" Mobility  Outcome Measure Help needed turning from your back to your side while in a flat bed without using bedrails?: None Help needed moving from lying on your back to sitting on the side of a flat bed without using bedrails?: None Help needed moving to and from a bed to a chair (including a wheelchair)?: A Little Help needed standing up from a chair using your arms (e.g., wheelchair or bedside chair)?: A Little Help needed to walk in hospital room?: A Little Help needed climbing 3-5 steps with a railing? : A Little 6 Click Score: 20    End of Session   Activity Tolerance: Patient tolerated treatment well Patient left: in chair;with call bell/phone within reach;with chair alarm set Nurse Communication: Mobility status PT Visit Diagnosis: Unsteadiness on feet (R26.81);Other abnormalities of gait and mobility (R26.89)    Time: 0973-5329 PT Time Calculation (min) (ACUTE ONLY): 23 min   Charges:   PT Evaluation $PT Eval Moderate Complexity: 1 Mod PT Treatments $Gait Training: 8-22 mins        09/05/2019  Ginger Carne., PT Acute Rehabilitation Services [phone]  (pager) [phone]  (office)  Tessie Fass Delvonte Berenson 09/05/2019, 1:43 PM

## 2019-09-05 NOTE — Evaluation (Signed)
[name] [place] Pager [phone] Office [phone]  Speech Language Pathology Evaluation Patient Details Name: [name] MRN: [id number] DOB: September 03, 1950 Today's Date: 09/05/2019 Time: 3474-2595 SLP Time Calculation (min) (ACUTE ONLY): 26 min  Problem List:  Patient Active Problem List   Diagnosis Date Noted  . Stroke (HCC) 09/03/2019  . Educated about COVID-19 virus infection 08/01/2019  . Influenza vaccination declined 11/17/2016  . TIA (transient ischemic attack) 09/28/2016  . Right sided weakness 09/28/2016  . Special screening for malignant neoplasms, colon 03/05/2015  . Change in bowel habits 03/05/2015  . CAD (coronary artery disease) 09/25/2014  . Elevated hemoglobin (HCC) 05/24/2014  . Pulse slow 04/26/2014  . Low serum potassium level 03/25/2014  . Hypertensive heart disease without heart failure 12/22/2013  . Essential hypertension 12/21/2013  . PVC (premature ventricular contraction) 10/06/2013  . Death of family member 09-18-13  . Left bundle branch block 07/25/2013  . Right thyroid nodule 07/18/2013  . Fatigue 07/18/2013  . Disorder of tooth 07/18/2013  . Autoimmune thyroiditis 06/24/2013  . Elevated TSH 06/19/2013  . Goiter 06/19/2013  . Left bundle branch block (LBBB) on electrocardiogram 06/19/2013  . Snoring 06/19/2013  . Alternative medicine 06/19/2013  . Hypertensive urgency 05/23/2013  . History of CVA (cerebrovascular accident) 05/23/2013  . Abdominal discomfort 05/23/2013  . DYSLIPIDEMIA 10/24/2009  . OBESITY, UNSPECIFIED 12/04/2008  . AMI, NON-Q WAVE, INITIAL EPISODE 12/04/2008  . ALLERGIC RHINITIS 12/02/2008   Past Medical History:  Past Medical History:  Diagnosis Date  . Arthritis   . Chicken pox   . Depression   . Diverticulitis   . Elevated urinary free cortisol level 03/25/2014   mild with low potasssium  endo consult   disc with patient    . Hypertension   . Sciatica   .  Seasonal allergies   . Stroke (HCC) 09/2008  . Thyroid disease    Past Surgical History:  Past Surgical History:  Procedure Laterality Date  . MINOR BREAST BIOPSY  1979  . WISDOM TOOTH EXTRACTION     HPI:  [name] is a 69 y.o. female with medical history significant for CVA, hypertension, CAD, left LBBB who presents with concerns of left sided heaviness and hypertension. MRI brain later shows small acute infarct of the anterolateral left thalamus.   Assessment / Plan / Recommendation Clinical Impression  Pt reports word finding difficulty and recalling people's names over past years however she did not demonstrate difficulty and expression of thoughts was accurate and effortless. Sustained attention mildly impaired as she had difficulty retaining instructions for several tasks if more than one or two steps. Pt recalled 2/5 words but could recall after semantic cue given indicating storage is intact. Language, visuospatial, naming are intact. Pt exhibits adequate awareness and states she does not feel she will have difficulty managing her meds and finances. No further ST needed at this time.       SLP Assessment  SLP Recommendation/Assessment: Patient does not need any further Speech Lanaguage Pathology Services SLP Visit Diagnosis: Cognitive communication deficit (R41.841)    Follow Up Recommendations  None    Frequency and Duration           SLP Evaluation Cognition  Overall Cognitive Status: Within Functional Limits for tasks assessed Arousal/Alertness: Awake/alert Orientation Level: Oriented X4 Attention: Sustained Sustained Attention: Impaired Sustained Attention Impairment: Verbal basic Memory: Impaired Memory Impairment: Retrieval deficit Awareness: Appears intact Problem Solving: Appears intact Safety/Judgment: Appears intact  Comprehension  Auditory Comprehension Overall Auditory Comprehension: Appears within functional limits for tasks  assessed Visual Recognition/Discrimination Discrimination: Not tested Reading Comprehension Reading Status: Not tested    Expression Expression Primary Mode of Expression: Verbal Verbal Expression Overall Verbal Expression: Appears within functional limits for tasks assessed Level of Generative/Spontaneous Verbalization: Conversation Repetition: No impairment Pragmatics: No impairment Written Expression Dominant Hand: Right Written Expression: Not tested   Oral / Motor  Oral Motor/Sensory Function Overall Oral Motor/Sensory Function: Within functional limits Motor Speech Overall Motor Speech: Appears within functional limits for tasks assessed Intelligibility: Intelligible Motor Planning: Witnin functional limits   GO                    [name] 09/05/2019, 1:11 PM

## 2019-09-05 NOTE — Progress Notes (Signed)
  Echocardiogram 2D Echocardiogram has been performed.  [name] 09/05/2019, 9:28 AM

## 2019-09-06 LAB — BASIC METABOLIC PANEL
Anion gap: 8 (ref 5–15)
BUN: 19 mg/dL (ref 8–23)
CO2: 23 mmol/L (ref 22–32)
Calcium: 8.8 mg/dL — ABNORMAL LOW (ref 8.9–10.3)
Chloride: 107 mmol/L (ref 98–111)
Creatinine, Ser: 1.18 mg/dL — ABNORMAL HIGH (ref 0.44–1.00)
GFR calc Af Amer: 55 mL/min — ABNORMAL LOW (ref >60)
GFR calc non Af Amer: 47 mL/min — ABNORMAL LOW (ref >60)
Glucose, Bld: 101 mg/dL — ABNORMAL HIGH (ref 70–99)
Potassium: 3.7 mmol/L (ref 3.5–5.1)
Sodium: 138 mmol/L (ref 135–145)

## 2019-09-06 MED ORDER — ASPIRIN 81 MG PO CHEW: 81.0000 mg | CHEWABLE_TABLET | Freq: Every day | ORAL | 1 refills | Status: DC

## 2019-09-06 MED ORDER — ATORVASTATIN CALCIUM 40 MG PO TABS: 40.0000 mg | ORAL_TABLET | Freq: Every day | ORAL | 1 refills | Status: DC

## 2019-09-06 MED ORDER — CLOPIDOGREL BISULFATE 75 MG PO TABS: 75.0000 mg | ORAL_TABLET | Freq: Every day | ORAL | 0 refills | Status: AC

## 2019-09-06 NOTE — Discharge Summary (Signed)
Physician Discharge Summary  [name] ZOX:[id number] DOB: 01/15/1951 DOA: 09/03/2019  PCP: Madelin HeadingsPanosh, Wanda K, MD  Admit date: 09/03/2019 Discharge date: 09/06/2019  Admitted From: Home Disposition:  Home  Discharge Condition:Stable CODE STATUS:FULL Diet recommendation: Heart Healthy  Brief/Interim Summary:  Patient is a 69 year old female with history of hypertension, depression, nonhemorrhagic CVA who presented with right-sided weakness, numbness.  MRI of the brain showed small acute anterolateral left thalamic infarct.  Neurology consulted.  Admitted for stroke work-up.  Stroke work-up completed.  She was seen by physical therapy and recommended home health on discharge.  Following problems were addressed during her hospitalization:  Acute left thalamic stroke: Presented with right-sided weakness, numbness.  MRI showed a small acute anterior lateral left thalamic infarct.  MRI negative for large vessel occlusion. Echo showed ejection fraction of 55%, grade 1 diastolic dysfunction. Carotid Doppler did not show any significant stenosis Hemoglobins of 5.4.  LDL of 100. Patient was not on any antiplatelet medications prior to admission.  Started on aspirin 81 mg and plavix daily.  Needs to continue aspirin and Plavix for 21 days and then aspirin alone. On Lipitor 40 mg daily Seen by PT/OT and recommended home health on discharge.  Hypertensive urgency:   Takes Bystolic, hydralazine, clonidine at home.resumed  AKI versus CKD stage II: Stable.Creatinine fluctuates from 1-1.2 as per previous blood works  Hypokalemia:Supplemented with potassium.  Obesity: BMI of 33.6   Discharge Diagnoses:  Principal Problem:   Stroke [facility](HCC) Active Problems:   Essential hypertension   CAD (coronary artery disease)    Discharge Instructions  Discharge Instructions    Ambulatory referral to Neurology   Complete by: As directed    An appointment is requested in approximately: 4 weeks    Diet - low sodium heart healthy   Complete by: As directed    Discharge instructions   Complete by: As directed    1) Please take prescribed medications as instructed 2)Follow up with your PCP in a week 3)Follow up with neurology in 4 weeks. Name and number of the provider group has been attached. 4)Follow up with home health services.   Increase activity slowly   Complete by: As directed      Allergies as of 09/06/2019      Reactions   Isovue [iopamidol] Itching   Pt states she had itching after CT in 2015.   Penicillins Swelling   Has patient had a PCN reaction causing immediate rash, facial/tongue/throat swelling, SOB or lightheadedness with hypotension: yes Has patient had a PCN reaction causing severe rash involving mucus membranes or skin necrosis: no Has patient had a PCN reaction that required hospitalization no Has patient had a PCN reaction occurring within the last 10 years: no If all of the above answers are "NO", then may proceed with Cephalosporin use.   Sulfa Antibiotics Itching      Medication List    TAKE these medications   ASHWAGANDHA PO Take 1 capsule by mouth daily.   aspirin 81 MG chewable tablet Chew 1 tablet (81 mg total) by mouth daily. Start taking on: September 07, 2019   atorvastatin 40 MG tablet Commonly known as: LIPITOR Take 1 tablet (40 mg total) by mouth daily. Start taking on: September 07, 2019   B-complex with vitamin C tablet Take 1 tablet by mouth daily.   Bystolic 20 MG Tabs Generic drug: Nebivolol HCl TAKE 1 TABLET EVERY MORNING   cloNIDine 0.1 MG tablet Commonly known as: CATAPRES Take 1 tablet (0.1 mg  total) by mouth 2 (two) times daily. What changed:   how much to take  when to take this  reasons to take this   clopidogrel 75 MG tablet Commonly known as: PLAVIX Take 1 tablet (75 mg total) by mouth daily for 19 days. Start taking on: September 07, 2019   co-enzyme Q-10 30 MG capsule Take 30 mg by mouth daily.   hydrALAZINE  50 MG tablet Commonly known as: APRESOLINE TAKE 1 TABLET THREE TIMES A DAY What changed: when to take this   MORINGA PO Take 1 packet by mouth daily.   MULTIPLE VITAMIN PO Take 1 tablet by mouth daily.   NATTOKINASE PO Take 1 tablet by mouth every evening. Helps blood flow   NON FORMULARY Chlorella:  Takes 15 capsules daily   OVER THE COUNTER MEDICATION Take 1 packet by mouth daily. Mushroom supplement   PROBIOTIC PO Take 1 tablet by mouth daily. Bifido Beadlet 50+   SPIRULINA PO Take 1 teaspoon by mouth daily   vitamin C 1000 MG tablet Take 5,000 mg by mouth every morning.   VITAMIN D PO Take 5,000 Units by mouth daily.       Follow-up Information    [facility] Follow up.   Specialty: Home Health Services Why: The home health agency will contact you for the first home visit. Contact information: [address] [place] [address] [phone]        Panosh, Neta Mends, MD. Schedule an appointment as soon as possible for a visit in 1 week(s).   Specialties: Internal Medicine, Pediatrics Contact information: [address] [place] [address] [phone]        [facility]. Schedule an appointment as soon as possible for a visit in 4 week(s).   Specialty: Neurology Contact information: [address] [place] [address] [phone]             Allergies  Allergen Reactions  . Isovue [Iopamidol] Itching    Pt states she had itching after CT in 2015.  Marland Kitchen Penicillins Swelling    Has patient had a PCN reaction causing immediate rash, facial/tongue/throat swelling, SOB or lightheadedness with hypotension: yes Has patient had a PCN reaction causing severe rash involving mucus membranes or skin necrosis: no Has patient had a PCN reaction that required hospitalization no Has patient had a PCN reaction occurring within the last 10 years: no If all of the above answers are "NO", then  may proceed with Cephalosporin use.   . Sulfa Antibiotics Itching    Consultations:  Neurology   Procedures/Studies: CT Head Wo Contrast  Result Date: 09/03/2019 CLINICAL DATA:  Stroke suspected EXAM: CT HEAD WITHOUT CONTRAST TECHNIQUE: Contiguous axial images were obtained from the base of the skull through the vertex without intravenous contrast. COMPARISON:  None. FINDINGS: Brain: No acute intracranial hemorrhage. No focal mass lesion. No CT evidence of acute infarction. No midline shift or mass effect. No hydrocephalus. Basilar cisterns are patent. There are periventricular and subcortical white matter hypodensities. Vascular: No hyperdense vessel or unexpected calcification. Skull: Normal. Negative for fracture or focal lesion. Sinuses/Orbits: Paranasal sinuses and mastoid air cells are clear. Orbits are clear. Other: None. IMPRESSION: 1. No acute intracranial findings. 2. White matter microvascular disease. Electronically Signed   By: Genevive Bi M.D.   On: 09/03/2019 20:28   MR ANGIO HEAD WO CONTRAST  Result Date: 09/04/2019 CLINICAL DATA:  Stroke EXAM: MRA HEAD WITHOUT CONTRAST TECHNIQUE: Angiographic images of  the Circle of Willis were obtained using MRA technique without intravenous contrast. COMPARISON:  MRI head 09/03/2019 FINDINGS: Internal carotid artery patent bilaterally without significant stenosis. Mild atherosclerotic disease on the left. Anterior and middle cerebral arteries patent bilaterally. Severe stenosis inferior division left MCA bifurcation. Moderate stenosis in the superior division of left MCA. Mild irregularity and M3 branches bilaterally. Anterior cerebral arteries patent bilaterally. Both vertebral arteries patent to the basilar. PICA patent bilaterally. Left AICA patent. Right AICA not visualized. Mild to moderate stenosis distal basilar. Posterior cerebral arteries patent bilaterally with P3 stenosis bilaterally. Negative for cerebral aneurysm. IMPRESSION:  Negative for large vessel occlusion. Intracranial atherosclerotic disease as above. Electronically Signed   By: Marlan Palau M.D.   On: 09/04/2019 17:20   MR Brain Wo Contrast (neuro protocol)  Result Date: 09/03/2019 CLINICAL DATA:  Right-sided weakness. EXAM: MRI HEAD WITHOUT CONTRAST TECHNIQUE: Multiplanar, multiecho pulse sequences of the brain and surrounding structures were obtained without intravenous contrast. COMPARISON:  Head CT 09/03/2019 Brain MRI 09/28/2016 FINDINGS: Brain: Small acute infarct of the anterolateral left thalamus, adjacent to the posterior limb of the internal capsule. No acute hemorrhage. Early confluent hyperintense T2-weighted signal of the periventricular and deep white matter, most commonly due to chronic ischemic microangiopathy. Normal volume of CSF spaces. No chronic microhemorrhage. Normal midline structures. Vascular: Normal flow voids. Skull and upper cervical spine: Normal marrow signal. Sinuses/Orbits: Negative. Other: None. IMPRESSION: 1. Small acute infarct of the anterolateral left thalamus, adjacent to the posterior limb of the internal capsule. No acute hemorrhage or mass effect. 2. Findings of chronic ischemic microangiopathy. Electronically Signed   By: Deatra Robinson M.D.   On: 09/03/2019 22:55   ECHOCARDIOGRAM COMPLETE  Result Date: 09/05/2019    ECHOCARDIOGRAM REPORT   Patient Name:   ANIE JUNIEL Date of Exam: 09/05/2019 Medical Rec #:  [id number]             Height:       64.0 in Accession #:    [id number]            Weight:       189.6 lb Date of Birth:  11-30-1950              BSA:          1.912 m Patient Age:    69 years              BP:           111/63 mmHg Patient Gender: F                     HR:           66 bpm. Exam Location:  Inpatient Procedure: 2D Echo Indications:    stroke 434.91  History:        Patient has prior history of Echocardiogram examinations, most                 recent 09/29/2016. CAD; Risk Factors:Hypertension.   Sonographer:    Delcie Roch Referring Phys: [id number] CHING T TU IMPRESSIONS  1. Left ventricular ejection fraction, by estimation, is 50 to 55%. The left ventricle has low normal function. The left ventricle has no regional wall motion abnormalities. Left ventricular diastolic parameters are consistent with Grade I diastolic dysfunction (impaired relaxation).  2. Right ventricular systolic function is normal. The right ventricular size is normal. There is normal pulmonary artery systolic pressure.  3. The mitral valve is grossly normal. Trivial  mitral valve regurgitation. No evidence of mitral stenosis.  4. The aortic valve is normal in structure. Aortic valve regurgitation is mild. No aortic stenosis is present.  5. Aortic dilatation noted. There is moderate dilatation of the ascending aorta measuring 41 mm.  6. The inferior vena cava is normal in size with <50% respiratory variability, suggesting right atrial pressure of 8 mmHg. FINDINGS  Left Ventricle: Left ventricular ejection fraction, by estimation, is 50 to 55%. The left ventricle has low normal function. The left ventricle has no regional wall motion abnormalities. The left ventricular internal cavity size was normal in size. There is no left ventricular hypertrophy. Left ventricular diastolic parameters are consistent with Grade I diastolic dysfunction (impaired relaxation). Right Ventricle: The right ventricular size is normal. No increase in right ventricular wall thickness. Right ventricular systolic function is normal. There is normal pulmonary artery systolic pressure. The tricuspid regurgitant velocity is 2.40 m/s, and  with an assumed right atrial pressure of 3 mmHg, the estimated right ventricular systolic pressure is 26.0 mmHg. Left Atrium: Left atrial size was normal in size. Right Atrium: Right atrial size was normal in size. Pericardium: There is no evidence of pericardial effusion. Mitral Valve: The mitral valve is grossly normal. Trivial  mitral valve regurgitation. No evidence of mitral valve stenosis. Tricuspid Valve: The tricuspid valve is normal in structure. Tricuspid valve regurgitation is trivial. No evidence of tricuspid stenosis. Aortic Valve: The aortic valve is normal in structure. Aortic valve regurgitation is mild. No aortic stenosis is present. Pulmonic Valve: The pulmonic valve was normal in structure. Pulmonic valve regurgitation is not visualized. Aorta: Aortic dilatation noted. There is moderate dilatation of the ascending aorta measuring 41 mm. Venous: The inferior vena cava is normal in size with less than 50% respiratory variability, suggesting right atrial pressure of 8 mmHg. IAS/Shunts: The atrial septum is grossly normal.  LEFT VENTRICLE PLAX 2D LVIDd:         4.80 cm  Diastology LVIDs:         3.50 cm  LV e' lateral:   5.77 cm/s LV PW:         1.10 cm  LV E/e' lateral: 10.2 LV IVS:        1.00 cm  LV e' medial:    5.00 cm/s LVOT diam:     2.00 cm  LV E/e' medial:  11.7 LV SV:         63 LV SV Index:   33 LVOT Area:     3.14 cm  RIGHT VENTRICLE             IVC RV S prime:     12.20 cm/s  IVC diam: 1.40 cm TAPSE (M-mode): 1.8 cm LEFT ATRIUM           Index       RIGHT ATRIUM           Index LA diam:      3.40 cm 1.78 cm/m  RA Area:     13.00 cm LA Vol (A2C): 38.6 ml 20.18 ml/m RA Volume:   30.60 ml  16.00 ml/m LA Vol (A4C): 56.3 ml 29.44 ml/m  AORTIC VALVE LVOT Vmax:   96.60 cm/s LVOT Vmean:  74.500 cm/s LVOT VTI:    0.199 m  AORTA Ao Root diam: 3.10 cm Ao Asc diam:  4.10 cm MITRAL VALVE               TRICUSPID VALVE MV Area (PHT): 2.91 cm  TR Peak grad:   23.0 mmHg MV Decel Time: 261 msec    TR Vmax:        240.00 cm/s MV E velocity: 58.70 cm/s MV A velocity: 90.80 cm/s  SHUNTS MV E/A ratio:  0.65        Systemic VTI:  0.20 m                            Systemic Diam: 2.00 cm Kristeen Miss MD Electronically signed by Kristeen Miss MD Signature Date/Time: 09/05/2019/2:26:47 PM    Final    VAS US CAROTID  Result Date:  09/05/2019 Carotid Arterial Duplex Study Indications:       CVA. Risk Factors:      Hypertension. Other Factors:     Small acute infarct of the anterolateral left thalamus. Comparison Study:  09/29/16 Performing Technologist: Jeb Levering RDMS, RVT  Examination Guidelines: A complete evaluation includes B-mode imaging, spectral Doppler, color Doppler, and power Doppler as needed of all accessible portions of each vessel. Bilateral testing is considered an integral part of a complete examination. Limited examinations for reoccurring indications may be performed as noted.  Right Carotid Findings: +----------+--------+--------+--------+------------------+--------+           PSV cm/sEDV cm/sStenosisPlaque DescriptionComments +----------+--------+--------+--------+------------------+--------+ CCA Prox  73      18                                         +----------+--------+--------+--------+------------------+--------+ CCA Distal87      24                                         +----------+--------+--------+--------+------------------+--------+ ICA Prox  50      16      1-39%   hypoechoic                 +----------+--------+--------+--------+------------------+--------+ ICA Distal92      30                                         +----------+--------+--------+--------+------------------+--------+ ECA       46      13                                         +----------+--------+--------+--------+------------------+--------+ +----------+--------+-------+----------------+-------------------+           PSV cm/sEDV cmsDescribe        Arm Pressure (mmHG) +----------+--------+-------+----------------+-------------------+ [id number]            Multiphasic, WNL                    +----------+--------+-------+----------------+-------------------+ +---------+--------+--+--------+--+---------+ VertebralPSV cm/s37EDV cm/s16Antegrade  +---------+--------+--+--------+--+---------+  Left Carotid Findings: +----------+--------+--------+--------+------------------+--------+           PSV cm/sEDV cm/sStenosisPlaque DescriptionComments +----------+--------+--------+--------+------------------+--------+ CCA Prox  73      24                                         +----------+--------+--------+--------+------------------+--------+  CCA Distal50      17                                         +----------+--------+--------+--------+------------------+--------+ ICA Prox  39      20      1-39%   heterogenous               +----------+--------+--------+--------+------------------+--------+ ICA Distal74      35                                         +----------+--------+--------+--------+------------------+--------+ ECA       55      11                                         +----------+--------+--------+--------+------------------+--------+ +----------+--------+--------+----------------+-------------------+           PSV cm/sEDV cm/sDescribe        Arm Pressure (mmHG) +----------+--------+--------+----------------+-------------------+ [id number]             Multiphasic, WNL                    +----------+--------+--------+----------------+-------------------+ +---------+--------+--+--------+--+---------+ VertebralPSV cm/s37EDV cm/s12Antegrade +---------+--------+--+--------+--+---------+   Summary: Right Carotid: Velocities in the right ICA are consistent with a 1-39% stenosis. Left Carotid: Velocities in the left ICA are consistent with a 1-39% stenosis. Vertebrals:  Bilateral vertebral arteries demonstrate antegrade flow. Subclavians: Normal flow hemodynamics were seen in bilateral subclavian              arteries. *See table(s) above for measurements and observations.  Electronically signed by Delia Heady MD on 09/05/2019 at 6:14:32 PM.    Final        Subjective: Patient seen and  examined at the bedside this morning.  Hemodynamically stable for discharge.  Discharge Exam: Vitals:   09/06/19 0358 09/06/19 0900  BP: (!) 147/94 (!) 149/102  Pulse: 64 81  Resp: 20   Temp: 98.6 F (37 C)   SpO2: 98% 100%   Vitals:   09/05/19 2027 09/05/19 2342 09/06/19 0358 09/06/19 0900  BP: (!) 174/94 134/88 (!) 147/94 (!) 149/102  Pulse: 72 71 64 81  Resp: 18 17 20    Temp: 98.5 F (36.9 C) 98.4 F (36.9 C) 98.6 F (37 C)   TempSrc: Oral Oral  Oral  SpO2: 100% 99% 98% 100%  Weight:      Height:        General: Pt is alert, awake, not in acute distress Cardiovascular: RRR, S1/S2 +, no rubs, no gallops Respiratory: CTA bilaterally, no wheezing, no rhonchi Abdominal: Soft, NT, ND, bowel sounds + Extremities: no edema, no cyanosis    The results of significant diagnostics from this hospitalization (including imaging, microbiology, ancillary and laboratory) are listed below for reference.     Microbiology: Recent Results (from the past 240 hour(s))  SARS Coronavirus 2 by RT PCR (hospital order, performed in [facility] hospital lab) Nasopharyngeal Nasopharyngeal Swab     Status: None   Collection Time: 09/03/19  9:27 PM   Specimen: Nasopharyngeal Swab  Result Value Ref Range Status   SARS Coronavirus 2 NEGATIVE NEGATIVE Final    Comment: (NOTE) SARS-CoV-2 target nucleic acids are  NOT DETECTED.  The SARS-CoV-2 RNA is generally detectable in upper and lower respiratory specimens during the acute phase of infection. The lowest concentration of SARS-CoV-2 viral copies this assay can detect is 250 copies / mL. A negative result does not preclude SARS-CoV-2 infection and should not be used as the sole basis for treatment or other patient management decisions.  A negative result may occur with improper specimen collection / handling, submission of specimen other than nasopharyngeal swab, presence of viral mutation(s) within the areas targeted by this assay, and  inadequate number of viral copies (<250 copies / mL). A negative result must be combined with clinical observations, patient history, and epidemiological information.  Fact Sheet for Patients:   StrictlyIdeas.no  Fact Sheet for Healthcare Providers: BankingDealers.co.za  This test is not yet approved or  cleared by the [place] FDA and has been authorized for detection and/or diagnosis of SARS-CoV-2 by FDA under an Emergency Use Authorization (EUA).  This EUA will remain in effect (meaning this test can be used) for the duration of the COVID-19 declaration under Section 564(b)(1) of the Act, 21 U.S.C. section 360bbb-3(b)(1), unless the authorization is terminated or revoked sooner.  Performed at [facility], [place] [address], [place],  [address]      Labs: BNP (last 3 results) No results for input(s): BNP in the last 8760 hours. Basic Metabolic Panel: Recent Labs  Lab 09/03/19 1900 09/05/19 0415 09/06/19 0257  NA 142 140 138  K 3.6 3.1* 3.7  CL 105 106 107  CO2 27 25 23   GLUCOSE 101* 100* 101*  BUN 15 17 19   CREATININE 1.04* 1.18* 1.18*  CALCIUM 8.9 8.8* 8.8*  MG  --  2.2  --    Liver Function Tests: Recent Labs  Lab 09/05/19 0415  AST 24  ALT 17  ALKPHOS 55  BILITOT 1.8*  PROT 6.4*  ALBUMIN 3.4*   No results for input(s): LIPASE, AMYLASE in the last 168 hours. No results for input(s): AMMONIA in the last 168 hours. CBC: Recent Labs  Lab 09/03/19 1900 09/05/19 0415  WBC 9.0 8.2  NEUTROABS 6.0 4.4  HGB 15.0 14.2  HCT 45.8 42.9  MCV 96.8 95.3  PLT 187 171   Cardiac Enzymes: No results for input(s): CKTOTAL, CKMB, CKMBINDEX, TROPONINI in the last 168 hours. BNP: Invalid input(s): POCBNP CBG: No results for input(s): GLUCAP in the last 168 hours. D-Dimer No results for input(s): DDIMER in the last 72 hours. Hgb A1c Recent Labs    09/04/19 0520  HGBA1C 5.4   Lipid  Profile Recent Labs    09/04/19 0520  CHOL 179  HDL 63  LDLCALC 100*  TRIG 80  CHOLHDL 2.8   Thyroid function studies No results for input(s): TSH, T4TOTAL, T3FREE, THYROIDAB in the last 72 hours.  Invalid input(s): FREET3 Anemia work up No results for input(s): VITAMINB12, FOLATE, FERRITIN, TIBC, IRON, RETICCTPCT in the last 72 hours. Urinalysis    Component Value Date/Time   COLORURINE COLORLESS (A) 09/03/2019 2126   APPEARANCEUR CLEAR 09/03/2019 2126   LABSPEC 1.004 (L) 09/03/2019 2126   PHURINE 7.0 09/03/2019 2126   GLUCOSEU NEGATIVE 09/03/2019 2126   HGBUR NEGATIVE 09/03/2019 2126   BILIRUBINUR NEGATIVE 09/03/2019 2126   BILIRUBINUR negative 01/18/2015 1446   KETONESUR 5 (A) 09/03/2019 2126   PROTEINUR NEGATIVE 09/03/2019 2126   UROBILINOGEN 0.2 01/24/2015 1736   NITRITE NEGATIVE 09/03/2019 2126   LEUKOCYTESUR SMALL (A) 09/03/2019 2126   Sepsis Labs Invalid input(s):  PROCALCITONIN,  WBC,  LACTICIDVEN Microbiology Recent Results (from the past 240 hour(s))  SARS Coronavirus 2 by RT PCR (hospital order, performed in [facility] hospital lab) Nasopharyngeal Nasopharyngeal Swab     Status: None   Collection Time: 09/03/19  9:27 PM   Specimen: Nasopharyngeal Swab  Result Value Ref Range Status   SARS Coronavirus 2 NEGATIVE NEGATIVE Final    Comment: (NOTE) SARS-CoV-2 target nucleic acids are NOT DETECTED.  The SARS-CoV-2 RNA is generally detectable in upper and lower respiratory specimens during the acute phase of infection. The lowest concentration of SARS-CoV-2 viral copies this assay can detect is 250 copies / mL. A negative result does not preclude SARS-CoV-2 infection and should not be used as the sole basis for treatment or other patient management decisions.  A negative result may occur with improper specimen collection / handling, submission of specimen other than nasopharyngeal swab, presence of viral mutation(s) within the areas targeted by this assay,  and inadequate number of viral copies (<250 copies / mL). A negative result must be combined with clinical observations, patient history, and epidemiological information.  Fact Sheet for Patients:   BoilerBrush.com.cy  Fact Sheet for Healthcare Providers: https://pope.com/  This test is not yet approved or  cleared by the [place] FDA and has been authorized for detection and/or diagnosis of SARS-CoV-2 by FDA under an Emergency Use Authorization (EUA).  This EUA will remain in effect (meaning this test can be used) for the duration of the COVID-19 declaration under Section 564(b)(1) of the Act, 21 U.S.C. section 360bbb-3(b)(1), unless the authorization is terminated or revoked sooner.  Performed at [facility], [address], [place], [place] [address]     Please note: You were cared for by a hospitalist during your hospital stay. Once you are discharged, your primary care physician will handle any further medical issues. Please note that NO REFILLS for any discharge medications will be authorized once you are discharged, as it is imperative that you return to your primary care physician (or establish a relationship with a primary care physician if you do not have one) for your post hospital discharge needs so that they can reassess your need for medications and monitor your lab values.    Time coordinating discharge: 40 minutes  SIGNED:   Burnadette Pop, MD  [facility] 09/06/2019, 11:17 AM Pager [phone]  If 7PM-7AM, please contact night-coverage [email or web address] Password [email or web address]

## 2019-09-06 NOTE — Progress Notes (Signed)
Occupational Therapy Treatment Patient Details Name: [name] MRN: [id number] DOB: [birth year] Today's Date: 09/06/2019    History of present illness [name] is a 69 y.o. female with medical history significant forHistory of CVA, hypertension, CAD, left LBBB who presents with concerns of right sided heaviness and hypertension.MRI: Small acute infarct of the anterolateral left thalamus, adjacent to the posterior limb of the internal capsule.   OT comments  Pt received supine in bed agreeable to OT intervention. Pt continues to present with decreased RUE FMC, micrographia, and decreased activity tolerance impacting pts ability to engage in BADLs. Issued pt RUE FMC HEP to facilitate increased [facility] for higher level BADLs. Pt completed therex as indicated below with no reports of increased pain. Additionally, issued crossword puzzle book for pt to practice handwriting skills.Pt requires gross supervision overall for functional mobility with RW with good safety awareness.  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 Updated DC recs to OP OT d/t independent prior level of function and to facilitate progression towards independence with IADLs. Communicated change in POC with OT/L. Will follow.   Follow Up Recommendations  Outpatient OT;Supervision - Intermittent    Equipment Recommendations  None recommended by OT    Recommendations for Other Services      Precautions / Restrictions Precautions Precautions: Fall Restrictions Weight Bearing Restrictions: No       Mobility Bed Mobility Overal bed mobility: Modified Independent             General bed mobility comments: no physical assist or cues needed  Transfers Overall transfer level: Needs assistance Equipment used: Rolling walker (2 wheeled) Transfers: Sit to/from Stand Sit to Stand: Supervision         General transfer comment: supervision for safety     Balance Overall balance assessment: Mild deficits observed, not formally tested                                         ADL either performed or assessed with clinical judgement   ADL Overall ADL's : Needs assistance/impaired     Grooming: Supervision/safety;Standing;Wash/dry hands Grooming Details (indicate cue type and reason): gross supervision     Lower Body Bathing: Supervison/ safety;Sitting/lateral leans Lower Body Bathing Details (indicate cue type and reason): simulated via LB dressing     Lower Body Dressing: Supervision/safety;Sitting/lateral leans Lower Body Dressing Details (indicate cue type and reason): able to adjust socks from EOB Toilet Transfer: Supervision/safety;Ambulation;RW;Grab bars Toilet Transfer Details (indicate cue type and reason): gross supervision with use of grab bars Toileting- Clothing Manipulation and Hygiene: Supervision/safety;Sit to/from stand       Functional mobility during ADLs: Supervision/safety;Rolling walker General ADL Comments: Pt continues to present with decreased RUE FMC and reports micrographia.     Vision       Perception     Praxis      Cognition Arousal/Alertness: Awake/alert Behavior During Therapy: WFL for tasks assessed/performed Overall Cognitive Status: Within Functional Limits for tasks assessed                                          Exercises Hand Exercises Digit Composite Flexion: AROM;Right;5 reps;Supine Composite Extension: AROM;Right;5 reps;Supine Digit Composite Abduction: AROM;Right;Supine;5 reps Digit Composite Adduction: AROM;Right;Supine;5 reps Digit Lifts: AROM;Right;Supine;5 reps  Opposition: AROM;Right;5 reps;Supine Hand Activities Writing Skills: Right;Other (comment) (crossword puzzles)   Shoulder Instructions       General Comments pt reports micrographia; enocuraged practicing with crossword puzzle and focusing on large, long letters     Pertinent Vitals/ Pain       Pain Assessment: Faces Pain Score: 2  Faces Pain Scale: Hurts a little bit Pain Location: R calf; R hand Pain Descriptors / Indicators: Aching;Restless;Pins and needles Pain Intervention(s): Limited activity within patient's tolerance;Monitored during session;Other (comment) (alerted RN)  Home Living                                          Prior Functioning/Environment              Frequency  Min 2X/week        Progress Toward Goals  OT Goals(current goals can now be found in the care plan section)  Progress towards OT goals: Progressing toward goals  Acute Rehab OT Goals Patient Stated Goal: go home OT Goal Formulation: With patient Time For Goal Achievement: 09/19/19 Potential to Achieve Goals: Good  Plan Frequency remains appropriate;Discharge plan needs to be updated    Co-evaluation                 AM-PAC OT "6 Clicks" Daily Activity     Outcome Measure   Help from another person eating meals?: None Help from another person taking care of personal grooming?: A Little Help from another person toileting, which includes using toliet, bedpan, or urinal?: None Help from another person bathing (including washing, rinsing, drying)?: A Little Help from another person to put on and taking off regular upper body clothing?: None Help from another person to put on and taking off regular lower body clothing?: None 6 Click Score: 22    End of Session Equipment Utilized During Treatment: Rolling walker  OT Visit Diagnosis: Other abnormalities of gait and mobility (R26.89);Muscle weakness (generalized) (M62.81)   Activity Tolerance Patient tolerated treatment well   Patient Left in chair;with call bell/phone within reach   Nurse Communication Mobility status;Other (comment) (c/o R calf pain)        Time: 1287-8676 OT Time Calculation (min): 26 min  Charges: OT General Charges $OT Visit: 1 Visit OT  Treatments $Self Care/Home Management : 8-22 mins $Therapeutic Exercise: 8-22 mins  Lanier Clam., COTA/L Acute Rehabilitation Services [phone]     Ihor Gully 09/06/2019, 11:03 AM

## 2019-09-06 NOTE — TOC Transition Note (Signed)
Transition of Care [facility]) - CM/SW Discharge Note   Patient Details  Name: [name] MRN: [id number] Date of Birth: Apr 04, 1950  [facility]) CM/SW Contact:  Kermit Balo, RN Phone Number: 09/06/2019, 11:09 AM   Clinical Narrative:    Pt lives at home with her son. Pt discharging home with [facility] services through [facility]. Marcelino Duster with [facility] accepted the referral.  Pt with no DME needs. She denies any issues with home medications or transportation. Pt has transport home today.   Final next level of care: Home w Home Health Services Barriers to Discharge: No Barriers Identified   Patient Goals and CMS Choice   CMS Medicare.gov Compare Post Acute Care list provided to:: Patient Choice offered to / list presented to : Patient  Discharge Placement                       Discharge Plan and Services                          HH Arranged: PT, OT [facility] Agency: [facility] Date [facility] Agency Contacted: 09/06/19   Representative spoke with at [facility] Agency: Marcelino Duster  Social Determinants of Health (SDOH) Interventions     Readmission Risk Interventions No flowsheet data found.

## 2019-09-06 NOTE — Care Management Important Message (Signed)
Important Message  Patient Details  Name: [name] MRN: [id number] Date of Birth: [birth year]   Medicare Important Message Given:  Yes     Chayil Gantt Stefan Church 09/06/2019, 2:48 PM

## 2019-09-07 ENCOUNTER — Telehealth: Payer: Self-pay | Admitting: *Deleted

## 2019-09-07 NOTE — Telephone Encounter (Signed)
1st attempt.  Left message on machine for patient to schedule a TCM/Hospital follow up.

## 2019-09-10 ENCOUNTER — Telehealth: Payer: Self-pay | Admitting: Internal Medicine

## 2019-09-10 DIAGNOSIS — Z6832 Body mass index (BMI) 32.0-32.9, adult: Secondary | ICD-10-CM | POA: Diagnosis not present

## 2019-09-10 DIAGNOSIS — I10 Essential (primary) hypertension: Secondary | ICD-10-CM | POA: Diagnosis not present

## 2019-09-10 DIAGNOSIS — Z7902 Long term (current) use of antithrombotics/antiplatelets: Secondary | ICD-10-CM | POA: Diagnosis not present

## 2019-09-10 DIAGNOSIS — H538 Other visual disturbances: Secondary | ICD-10-CM | POA: Diagnosis not present

## 2019-09-10 DIAGNOSIS — R202 Paresthesia of skin: Secondary | ICD-10-CM | POA: Diagnosis not present

## 2019-09-10 DIAGNOSIS — M1990 Unspecified osteoarthritis, unspecified site: Secondary | ICD-10-CM | POA: Diagnosis not present

## 2019-09-10 DIAGNOSIS — Z7982 Long term (current) use of aspirin: Secondary | ICD-10-CM | POA: Diagnosis not present

## 2019-09-10 DIAGNOSIS — I447 Left bundle-branch block, unspecified: Secondary | ICD-10-CM | POA: Diagnosis not present

## 2019-09-10 DIAGNOSIS — R29898 Other symptoms and signs involving the musculoskeletal system: Secondary | ICD-10-CM | POA: Diagnosis not present

## 2019-09-10 DIAGNOSIS — F329 Major depressive disorder, single episode, unspecified: Secondary | ICD-10-CM | POA: Diagnosis not present

## 2019-09-10 DIAGNOSIS — E039 Hypothyroidism, unspecified: Secondary | ICD-10-CM | POA: Diagnosis not present

## 2019-09-10 DIAGNOSIS — R2681 Unsteadiness on feet: Secondary | ICD-10-CM | POA: Diagnosis not present

## 2019-09-10 DIAGNOSIS — I69898 Other sequelae of other cerebrovascular disease: Secondary | ICD-10-CM | POA: Diagnosis not present

## 2019-09-10 DIAGNOSIS — E669 Obesity, unspecified: Secondary | ICD-10-CM | POA: Diagnosis not present

## 2019-09-10 DIAGNOSIS — E785 Hyperlipidemia, unspecified: Secondary | ICD-10-CM | POA: Diagnosis not present

## 2019-09-10 DIAGNOSIS — I2510 Atherosclerotic heart disease of native coronary artery without angina pectoris: Secondary | ICD-10-CM | POA: Diagnosis not present

## 2019-09-10 NOTE — Telephone Encounter (Signed)
Please advise 

## 2019-09-10 NOTE — Telephone Encounter (Signed)
I am ok for order but I will need to see her for appt   To continue oorders   Last visit was 2019

## 2019-09-10 NOTE — Telephone Encounter (Signed)
[name] is call to get verbal orders for home health PT for the pt when discharged from the hospital.  May leave orders on her secured line.

## 2019-09-11 ENCOUNTER — Telehealth: Payer: Self-pay | Admitting: Internal Medicine

## 2019-09-11 DIAGNOSIS — I69898 Other sequelae of other cerebrovascular disease: Secondary | ICD-10-CM | POA: Diagnosis not present

## 2019-09-11 DIAGNOSIS — I10 Essential (primary) hypertension: Secondary | ICD-10-CM | POA: Diagnosis not present

## 2019-09-11 DIAGNOSIS — F329 Major depressive disorder, single episode, unspecified: Secondary | ICD-10-CM | POA: Diagnosis not present

## 2019-09-11 DIAGNOSIS — R29898 Other symptoms and signs involving the musculoskeletal system: Secondary | ICD-10-CM | POA: Diagnosis not present

## 2019-09-11 DIAGNOSIS — R202 Paresthesia of skin: Secondary | ICD-10-CM | POA: Diagnosis not present

## 2019-09-11 DIAGNOSIS — H538 Other visual disturbances: Secondary | ICD-10-CM | POA: Diagnosis not present

## 2019-09-11 NOTE — Telephone Encounter (Signed)
[name] with [facility] is requesting a verbal order for occupation therapy- 1 time a week for the first week, 2 times a week for 2 weeks.   [name]'s [phone]

## 2019-09-11 NOTE — Telephone Encounter (Signed)
Called Almira and gave her the message from Dr. Fabian Sharp. Nino Glow stated that the patient did mention that she needed an appointment per hospital recommendations. Abagail Kitchens to remind patient. Almira verbalized an understanding.

## 2019-09-11 NOTE — Telephone Encounter (Signed)
I called patient and left a detailed voice message for patient to call us back and schedule an in office or virtual visit with Dr. Fabian Sharp.  Please see recent order. Please advise.

## 2019-09-11 NOTE — Telephone Encounter (Signed)
Called [name] and gave him the OK for approval. I advised that patient needs to have a visit for further approvals. [name] verbalized an understanding.

## 2019-09-11 NOTE — Telephone Encounter (Signed)
As previously stated  Ok to approve pending   Fu hospital visit  Last seen  2019

## 2019-09-14 ENCOUNTER — Telehealth: Payer: Self-pay | Admitting: Internal Medicine

## 2019-09-14 DIAGNOSIS — I69898 Other sequelae of other cerebrovascular disease: Secondary | ICD-10-CM | POA: Diagnosis not present

## 2019-09-14 DIAGNOSIS — H538 Other visual disturbances: Secondary | ICD-10-CM | POA: Diagnosis not present

## 2019-09-14 DIAGNOSIS — R29898 Other symptoms and signs involving the musculoskeletal system: Secondary | ICD-10-CM | POA: Diagnosis not present

## 2019-09-14 DIAGNOSIS — R202 Paresthesia of skin: Secondary | ICD-10-CM | POA: Diagnosis not present

## 2019-09-14 DIAGNOSIS — F329 Major depressive disorder, single episode, unspecified: Secondary | ICD-10-CM | POA: Diagnosis not present

## 2019-09-14 DIAGNOSIS — I10 Essential (primary) hypertension: Secondary | ICD-10-CM | POA: Diagnosis not present

## 2019-09-14 NOTE — Telephone Encounter (Signed)
[name] and he stated that her BP was 160/100 and was checked in both arms. Patient stated that she did not take her medication yet and stated that she has been staying hydrated.  She has an OV on 09/19/2019, do you want me to see if she can come in earlier?

## 2019-09-14 NOTE — Telephone Encounter (Signed)
[name] with [facility], calling to give an update on pt. Pt has an elevated blood pressure.   [name] [phone]

## 2019-09-14 NOTE — Telephone Encounter (Signed)
Cardiology should be  Helping managing her  Uncontrolled hypertension and refilling her meds for this   I have not seen her since 2019  in the past  Year her contact for hypertension that has been difficult to control  have been with  Cardiology team   (Dr Antoine Poche  And in future appt w  dr Tresa Endo )  Please have her take her medication from discharge and see what bp is doing then     needs to have fu  With cardiology within the next 2 weeks or so  perhaps in hypertension clinic   .   .  If bp doesn't  come down to reasonable below 160  and below range then please contact the cardiology team also   At this t ime she has ppt with me next week.

## 2019-09-18 ENCOUNTER — Telehealth: Payer: Self-pay | Admitting: Internal Medicine

## 2019-09-18 NOTE — Telephone Encounter (Signed)
Called patient and she has a MyChart appointment tomorrow because she does not have a ride and her car is in the shop and she stated that she stopped her Plavix and Atorvastatin because she did not know which one is causing the rash. She did take her Aspirin 81mg .  She did not take these today and the rash is some better and wasn't sure if she should take or not take tomorrow also.  Please advise as she is not sure what to do until she has her visit with you.

## 2019-09-18 NOTE — Telephone Encounter (Signed)
I haven't seen her since 2019  Need OV   Make sure she  Brings her medications and or list with her  And name of med she is  Concerned about.

## 2019-09-18 NOTE — Telephone Encounter (Signed)
Pt has an itchy throat and stomach ache. Per pt, [facility], asked, her to call and speak to Dr. Fabian Sharp. Per pt, she feels it's one of her medications that is making her have an itchy throat and stomach ache. Thanks

## 2019-09-18 NOTE — Telephone Encounter (Signed)
Please see message. Please advise if you want to add on for today or okay for tomorrow?

## 2019-09-18 NOTE — Telephone Encounter (Signed)
Called [name] and gave him the information for the cardiology doctor. [name] thanked me and will keep [name] updated.

## 2019-09-19 ENCOUNTER — Other Ambulatory Visit: Payer: Self-pay

## 2019-09-19 ENCOUNTER — Encounter: Payer: Self-pay | Admitting: Internal Medicine

## 2019-09-19 ENCOUNTER — Telehealth (INDEPENDENT_AMBULATORY_CARE_PROVIDER_SITE_OTHER): Payer: Medicare Other | Admitting: Internal Medicine

## 2019-09-19 VITALS — BP 138/88 | HR 61 | Ht 64.0 in | Wt 184.0 lb

## 2019-09-19 DIAGNOSIS — L509 Urticaria, unspecified: Secondary | ICD-10-CM

## 2019-09-19 DIAGNOSIS — I639 Cerebral infarction, unspecified: Secondary | ICD-10-CM | POA: Diagnosis not present

## 2019-09-19 DIAGNOSIS — Z79899 Other long term (current) drug therapy: Secondary | ICD-10-CM | POA: Diagnosis not present

## 2019-09-19 DIAGNOSIS — I119 Hypertensive heart disease without heart failure: Secondary | ICD-10-CM

## 2019-09-19 DIAGNOSIS — I6930 Unspecified sequelae of cerebral infarction: Secondary | ICD-10-CM

## 2019-09-19 DIAGNOSIS — Z09 Encounter for follow-up examination after completed treatment for conditions other than malignant neoplasm: Secondary | ICD-10-CM

## 2019-09-19 DIAGNOSIS — L299 Pruritus, unspecified: Secondary | ICD-10-CM

## 2019-09-19 NOTE — Progress Notes (Signed)
Virtual Visit via Video Note  I connected with@ on 09/19/19 at  4:00 PM EDT by a video enabled telemedicine application and verified that I am speaking with the correct person using two identifiers. Location patient: home Location provider:work  office Persons participating in the virtual visit: patient, provider  WIth national recommendations  regarding COVID 19 pandemic   video visit is advised over in office visit for this patient.  Patient aware  of the limitations of evaluation and management by telemedicine and  availability of in person appointments. and agreed to proceed.   HPI: Krystal Mcclay presents for video visit  coulnt get transportation for in person visit  Just hosp for  cva   Had no ride so video visit  Has underyl;ying resitsant ht  Under cardiology care team .  Was put on  Asa and plavix and atorvastatin     Began itching head and then to legs  went to sleep   And   itching  Head  Felt like had a measles  dvelop whelps  But no assoc sx  Or edema  No hx of same  Has stopped the supplements for the last 3 days  Only new meds were asa plavix and atorva so was held yesterday x asa     Onset Monday July 5  night  And subsided and then moving around got itching  Water  Her garden nd stirring   Flared  Some better now More,  BPSlept 9-10  Hour last night nad felt much better   But had some inc itching and whelp today off and on  No cp sob NVD some constipation out of hosp.  179/99  BP and repeat at  Calmed 149/95 and 138/86 sitting   Takes bp med all tobether hydralazine clonidine and bystolic   Also asks about changing meter from [employer] case sensitive to em waves/  Feels bad in kitchen  Too much electricity poer localized?  Letter to [employer] to change back to analogue ROS: See pertinent positives and negatives per HPI. Past hx mahad hives with  Strawberries oranges   ? May be had something with asa ? Past Medical History:  Diagnosis Date  . Arthritis    . Chicken pox   . Depression   . Diverticulitis   . Elevated urinary free cortisol level 03/25/2014   mild with low potasssium  endo consult   disc with patient    . Hypertension   . Sciatica   . Seasonal allergies   . Stroke (HCC) 09/2008  . Thyroid disease     Past Surgical History:  Procedure Laterality Date  . MINOR BREAST BIOPSY  1979  . WISDOM TOOTH EXTRACTION      Family History  Problem Relation Age of Onset  . Heart failure Mother   . Heart disease Mother   . Diabetes Mother   . Hypertension Mother   . Hypertension Father   . Diabetes Maternal Grandmother   . Hypertension Sister   . Hypertension Brother   . Cancer Brother        Prostate  . Hypertension Sister     Social History   Tobacco Use  . Smoking status: Never Smoker  . Smokeless tobacco: Never Used  Vaping Use  . Vaping Use: Never assessed  Substance Use Topics  . Alcohol use: Yes    Comment: Social Use only  . Drug use: No      Current Outpatient Medications:  .  Ascorbic Acid (VITAMIN C) 1000 MG tablet, Take 5,000 mg by mouth every morning. , Disp: , Rfl:  .  ASHWAGANDHA PO, Take 1 capsule by mouth daily. , Disp: , Rfl:  .  aspirin 81 MG chewable tablet, Chew 1 tablet (81 mg total) by mouth daily., Disp: 30 tablet, Rfl: 1 .  atorvastatin (LIPITOR) 40 MG tablet, Take 1 tablet (40 mg total) by mouth daily., Disp: 30 tablet, Rfl: 1 .  B Complex-C (B-COMPLEX WITH VITAMIN C) tablet, Take 1 tablet by mouth daily.  , Disp: , Rfl:  .  BYSTOLIC 20 MG TABS, TAKE 1 TABLET EVERY MORNING, Disp: 90 tablet, Rfl: 3 .  Cholecalciferol (VITAMIN D PO), Take 5,000 Units by mouth daily. , Disp: , Rfl:  .  cloNIDine (CATAPRES) 0.1 MG tablet, Take 1 tablet (0.1 mg total) by mouth 2 (two) times daily. (Patient taking differently: Take 0.5 mg by mouth daily as needed (high blood pressure). ), Disp: 180 tablet, Rfl: 1 .  clopidogrel (PLAVIX) 75 MG tablet, Take 1 tablet (75 mg total) by mouth daily for 19 days., Disp:  19 tablet, Rfl: 0 .  co-enzyme Q-10 30 MG capsule, Take 30 mg by mouth daily., Disp: , Rfl:  .  hydrALAZINE (APRESOLINE) 50 MG tablet, TAKE 1 TABLET THREE TIMES A DAY (Patient taking differently: Take 50 mg by mouth in the morning and at bedtime. ), Disp: 270 tablet, Rfl: 3 .  Moringa Oleifera (MORINGA PO), Take 1 packet by mouth daily. , Disp: , Rfl:  .  MULTIPLE VITAMIN PO, Take 1 tablet by mouth daily. , Disp: , Rfl:  .  NON FORMULARY, Chlorella:  Takes 15 capsules daily, Disp: , Rfl:  .  NON FORMULARY, Super Beet powder that she adds to her smoothie, Disp: , Rfl:  .  OVER THE COUNTER MEDICATION, Take 1 packet by mouth daily. Mushroom supplement, Disp: , Rfl:  .  Probiotic Product (PROBIOTIC PO), Take 1 tablet by mouth daily. Bifido Beadlet 50+, Disp: , Rfl:  .  SPIRULINA PO, Take 1 teaspoon by mouth daily, Disp: , Rfl:  .  NATTOKINASE PO, Take 1 tablet by mouth every evening. Helps blood flow (Patient not taking: Reported on 09/19/2019), Disp: , Rfl:   EXAM: BP Readings from Last 3 Encounters:  09/19/19 138/88  09/06/19 (!) 140/98  09/03/19 (!) 201/109    VITALS per patient if applicable:  GENERAL: alert, oriented, appears well and in no acute distress  HEENT: atraumatic, conjunttiva clear, no obvious abnormalities on inspection of external nose and ears NECK: normal movements of the head and neck LUNGS: on inspection no signs of respiratory distress, breathing rate appears normal, no obvious gross SOB, gasping or wheezing CV: no obvious cyanosis MS: moves all visible extremities without noticeable abnormality PSYCH/NEURO: pleasant and cooperative, no obvious depression or anxiety, speech and thought processing grossly intact Lab Results  Component Value Date   WBC 8.2 09/05/2019   HGB 14.2 09/05/2019   HCT 42.9 09/05/2019   PLT 171 09/05/2019   GLUCOSE 101 (H) 09/06/2019   CHOL 179 09/04/2019   TRIG 80 09/04/2019   HDL 63 09/04/2019   LDLCALC 100 (H) 09/04/2019   ALT 17  09/05/2019   AST 24 09/05/2019   NA 138 09/06/2019   K 3.7 09/06/2019   CL 107 09/06/2019   CREATININE 1.18 (H) 09/06/2019   BUN 19 09/06/2019   CO2 23 09/06/2019   TSH 4.12 01/02/2018   INR 0.99 09/28/2016   HGBA1C 5.4  09/04/2019    ASSESSMENT AND PLAN:  Discussed the following assessment and plan:    ICD-10-CM   1. Hives  L50.9   2. Itching  L29.9 Ambulatory referral to Neurology  3. Hypertensive heart disease without heart failure  I11.9   4. Medication management  Z79.899 Ambulatory referral to Neurology  5. Sequela, post-stroke  I69.30 Ambulatory referral to Neurology  6. Hospital discharge follow-up  Z09   Histamine release response   Uncertain cause   We disc about poss causes  She prefers to stop  the asa and stop the plavix and hold the atorva for now .   Continue off the supplements  Can try fluids and kiwi for constipation.  Is supposed to see neuro 4 weeks out but no appt made for her  She saw neuro team and dr Pearlean Brownie in hospital.   Will do referral  For this to facilitate  .   Counseled.  Can add benadryl  And if needed zyrtec   In next few days   Expectant management and discussion of plan and treatment with opportunity to ask questions and all were answered. The patient agreed with the plan and demonstrated an understanding of the instructions.  42 minutes   reviewed and visit   Total  Advised to call back or seek an in-person evaluation if worsening  or having  further concerns . Return for update [name] on friday  2 days about the itching hive reaction.    Berniece Andreas, MD

## 2019-09-20 NOTE — Progress Notes (Signed)
Sorry,  I don't have any other suggestions given her multiple med intolerances.  [name]

## 2019-09-24 DIAGNOSIS — R29898 Other symptoms and signs involving the musculoskeletal system: Secondary | ICD-10-CM | POA: Diagnosis not present

## 2019-09-24 DIAGNOSIS — I10 Essential (primary) hypertension: Secondary | ICD-10-CM | POA: Diagnosis not present

## 2019-09-24 DIAGNOSIS — F329 Major depressive disorder, single episode, unspecified: Secondary | ICD-10-CM | POA: Diagnosis not present

## 2019-09-24 DIAGNOSIS — H538 Other visual disturbances: Secondary | ICD-10-CM | POA: Diagnosis not present

## 2019-09-24 DIAGNOSIS — I69898 Other sequelae of other cerebrovascular disease: Secondary | ICD-10-CM | POA: Diagnosis not present

## 2019-09-24 DIAGNOSIS — R202 Paresthesia of skin: Secondary | ICD-10-CM | POA: Diagnosis not present

## 2019-09-25 ENCOUNTER — Telehealth: Payer: Self-pay | Admitting: Internal Medicine

## 2019-09-25 DIAGNOSIS — H538 Other visual disturbances: Secondary | ICD-10-CM | POA: Diagnosis not present

## 2019-09-25 DIAGNOSIS — F329 Major depressive disorder, single episode, unspecified: Secondary | ICD-10-CM | POA: Diagnosis not present

## 2019-09-25 DIAGNOSIS — I10 Essential (primary) hypertension: Secondary | ICD-10-CM | POA: Diagnosis not present

## 2019-09-25 DIAGNOSIS — R202 Paresthesia of skin: Secondary | ICD-10-CM | POA: Diagnosis not present

## 2019-09-25 DIAGNOSIS — I69898 Other sequelae of other cerebrovascular disease: Secondary | ICD-10-CM | POA: Diagnosis not present

## 2019-09-25 DIAGNOSIS — R29898 Other symptoms and signs involving the musculoskeletal system: Secondary | ICD-10-CM | POA: Diagnosis not present

## 2019-09-25 NOTE — Telephone Encounter (Signed)
Please see message.  Please advise. 

## 2019-09-25 NOTE — Telephone Encounter (Signed)
Will Shalla from [facility] needs verbal orders to extend OT for 1w3?  He also wanted to report that the pt's bp was elevated 161/101. He stated that the pt disclosed that she stopped taking her bp meds and restarted last night after midnight.   Will can be reached at [phone] -ok to leave a detailed message

## 2019-09-25 NOTE — Telephone Encounter (Signed)
Pt stated when she spoke to Panosh last Wed she told her she was itchy really bad from head to toe and thought it was the medication so she stopped those. Her bp has been high ever since. She started taking the aspirin half of it in the AM and other half in the PM and it helped some but it went up again. She thought Panosh mentioned something for itching but she is wondering what she suggest for that-she is not itching as much but still have that feeling?   She also said that she couldn't get into Neuro for 4 weeks and Panosh said she was going to see if she could do anything to make it sooner.   Her PT therapy was unable to happen due to her BP being high.   Pt would like a call back at [phone]

## 2019-09-26 ENCOUNTER — Encounter: Payer: Self-pay | Admitting: Neurology

## 2019-09-26 DIAGNOSIS — R29898 Other symptoms and signs involving the musculoskeletal system: Secondary | ICD-10-CM | POA: Diagnosis not present

## 2019-09-26 DIAGNOSIS — R202 Paresthesia of skin: Secondary | ICD-10-CM | POA: Diagnosis not present

## 2019-09-26 DIAGNOSIS — I10 Essential (primary) hypertension: Secondary | ICD-10-CM | POA: Diagnosis not present

## 2019-09-26 DIAGNOSIS — I69898 Other sequelae of other cerebrovascular disease: Secondary | ICD-10-CM | POA: Diagnosis not present

## 2019-09-26 DIAGNOSIS — F329 Major depressive disorder, single episode, unspecified: Secondary | ICD-10-CM | POA: Diagnosis not present

## 2019-09-26 DIAGNOSIS — H538 Other visual disturbances: Secondary | ICD-10-CM | POA: Diagnosis not present

## 2019-09-26 NOTE — Telephone Encounter (Signed)
Ok   To do  Agree with having her restart her BP meds

## 2019-09-26 NOTE — Telephone Encounter (Signed)
Not sure  How to help except she needs to get back on BP medication   Can we get her into the BP clinic as ap ? (Via cardiology .)

## 2019-09-26 NOTE — Telephone Encounter (Signed)
Called Will and gave him the message from Dr. Fabian Sharp. Will verbalized an understanding.

## 2019-09-26 NOTE — Telephone Encounter (Signed)
Called patient and LMOVM to return call  Left a detailed voice message to discuss message from Dr. Fabian Sharp with patient.

## 2019-09-27 DIAGNOSIS — H538 Other visual disturbances: Secondary | ICD-10-CM | POA: Diagnosis not present

## 2019-09-27 DIAGNOSIS — I69898 Other sequelae of other cerebrovascular disease: Secondary | ICD-10-CM | POA: Diagnosis not present

## 2019-09-27 DIAGNOSIS — F329 Major depressive disorder, single episode, unspecified: Secondary | ICD-10-CM | POA: Diagnosis not present

## 2019-09-27 DIAGNOSIS — R202 Paresthesia of skin: Secondary | ICD-10-CM | POA: Diagnosis not present

## 2019-09-27 DIAGNOSIS — R29898 Other symptoms and signs involving the musculoskeletal system: Secondary | ICD-10-CM | POA: Diagnosis not present

## 2019-09-27 DIAGNOSIS — I10 Essential (primary) hypertension: Secondary | ICD-10-CM | POA: Diagnosis not present

## 2019-09-28 NOTE — Telephone Encounter (Signed)
[name] called back and I gave her the message from Dr. Fabian Sharp and she stated that she has an appointment with Dr. Everlena Cooper on 10/05/2019 and she will also reach out to Dr. Antoine Poche. [name] verbalized an understanding.

## 2019-10-01 ENCOUNTER — Telehealth: Payer: Self-pay | Admitting: Cardiology

## 2019-10-01 NOTE — Telephone Encounter (Signed)
Pt c/o medication issue:  1. Name of Medication:   atorvastatin (LIPITOR) 40 MG tablet  Plavix   2. How are you currently taking this medication (dosage and times per day)? As directed   3. Are you having a reaction (difficulty breathing--STAT)? Yes  4. What is your medication issue? Patient states medication is causing severe itchiness from head to toe.

## 2019-10-01 NOTE — Telephone Encounter (Signed)
Returned call to patient-she states she was started on Plavix, ASA, and Lipitor in the hospital.  She states day 2-3 after taking these medications she started having extreme itching all over her body.   She states she saw Dr. Fabian Sharp 7/7 and was told to stop her Plavix, ASA, and Lipitor due to itching/hives.    Since stopping these, itching has completely resolved.  She states she has restarted the ASA "on and off" 1/2 tablet BID and itching has not returned.   She states she is taking Nattokinase to help with blood flow (supplement on med list).     Per chart review: ASA, Plavix, and atorvastatin started by neurology due to CVA.  Advised to call Neuro to make them aware and for them to provide direction/further recommendations.   Patient verbalized understanding.  She has appt Friday with Neuro as well.

## 2019-10-02 DIAGNOSIS — H538 Other visual disturbances: Secondary | ICD-10-CM | POA: Diagnosis not present

## 2019-10-02 DIAGNOSIS — I69898 Other sequelae of other cerebrovascular disease: Secondary | ICD-10-CM | POA: Diagnosis not present

## 2019-10-02 DIAGNOSIS — R29898 Other symptoms and signs involving the musculoskeletal system: Secondary | ICD-10-CM | POA: Diagnosis not present

## 2019-10-02 DIAGNOSIS — I10 Essential (primary) hypertension: Secondary | ICD-10-CM | POA: Diagnosis not present

## 2019-10-02 DIAGNOSIS — R202 Paresthesia of skin: Secondary | ICD-10-CM | POA: Diagnosis not present

## 2019-10-02 DIAGNOSIS — F329 Major depressive disorder, single episode, unspecified: Secondary | ICD-10-CM | POA: Diagnosis not present

## 2019-10-03 DIAGNOSIS — R202 Paresthesia of skin: Secondary | ICD-10-CM | POA: Diagnosis not present

## 2019-10-03 DIAGNOSIS — R29898 Other symptoms and signs involving the musculoskeletal system: Secondary | ICD-10-CM | POA: Diagnosis not present

## 2019-10-03 DIAGNOSIS — I10 Essential (primary) hypertension: Secondary | ICD-10-CM | POA: Diagnosis not present

## 2019-10-03 DIAGNOSIS — H538 Other visual disturbances: Secondary | ICD-10-CM | POA: Diagnosis not present

## 2019-10-03 DIAGNOSIS — I69898 Other sequelae of other cerebrovascular disease: Secondary | ICD-10-CM | POA: Diagnosis not present

## 2019-10-03 DIAGNOSIS — F329 Major depressive disorder, single episode, unspecified: Secondary | ICD-10-CM | POA: Diagnosis not present

## 2019-10-03 NOTE — Progress Notes (Signed)
NEUROLOGY CONSULTATION NOTE  [name] MRN: [id number] DOB: [birth year]  Referring provider: Berniece Andreas, MD Primary care provider: Berniece Andreas, MD  Reason for consult:  stroke  HISTORY OF PRESENT ILLNESS: [name] is a 69 year old right-handed female with HTN and thyroid disease who presents for stroke.  History supplemented by hospital records. CT head, MRI brain, MRA of head personally reviewed.  Current mediations:  ASA 81mg , clonidiine, hydralazine  She has residual right hand and foot paresthesias since a stroke in 2010.  However, she had not remained on antiplatelet therapy.  She was admitted to the hospital on 09/03/2019 after presenting with worsening right sided upper and lower extremity paresthesias and heaviness.  Blood pressure was 200s/100s.  CT head showed no acute abnormality but MRI of brain showed small infarct involving the left thalamus and posterior limb of internal capsule.  MRA of head showed intracranial atherosclerosis and stenosis but no large vessel occlusion.  Carotid doppler showed 1-39% bilateral ICA stenosis and antegrade flow of both vertebral arteries.  2D Echo showed EF 50-55% with no cardiac source of emoblus.  LDL was 100 and Hgb A1c was 5.4.  She was discharged on ASA 81mg  and Plavix 75mg  daily for 3 weeks, followed by ASA alone.  She was discharged on atorvastatin 40mg .  She stopped atorvastatin because it caused itching.  She is still having labile blood pressure.     09/04/2019 MRI BRAIN WO:  1. Small acute infarct of the anterolateral left thalamus, adjacent to the posterior limb of the internal capsule. No acute hemorrhage or mass effect.  2. Findings of chronic ischemic microangiopathy. 09/04/2019 MRA HEAD:  Internal carotid artery patent bilaterally without significant stenosis. Mild atherosclerotic disease on the left. Anterior and middle cerebral arteries patent bilaterally. Severe stenosis inferior division left MCA  bifurcation. Moderate stenosis in the superior division of left MCA. Mild irregularity and M3 branches bilaterally. Anterior cerebral arteries patent bilaterally.  Both vertebral arteries patent to the basilar. PICA patent bilaterally. Left AICA patent. Right AICA not visualized. Mild to moderate stenosis distal basilar. Posterior cerebral arteries patent bilaterally with P3 stenosis bilaterally.  Negative for cerebral aneurysm.  PAST MEDICAL HISTORY: Past Medical History:  Diagnosis Date  . Arthritis   . Chicken pox   . Depression   . Diverticulitis   . Elevated urinary free cortisol level 03/25/2014   mild with low potasssium  endo consult   disc with patient    . Hypertension   . Sciatica   . Seasonal allergies   . Stroke (HCC) 09/2008  . Thyroid disease     PAST SURGICAL HISTORY: Past Surgical History:  Procedure Laterality Date  . MINOR BREAST BIOPSY  1979  . WISDOM TOOTH EXTRACTION      MEDICATIONS: Current Outpatient Medications on File Prior to Visit  Medication Sig Dispense Refill  . Ascorbic Acid (VITAMIN C) 1000 MG tablet Take 5,000 mg by mouth every morning.     . ASHWAGANDHA PO Take 1 capsule by mouth daily.     09/06/2019 aspirin 81 MG chewable tablet Chew 1 tablet (81 mg total) by mouth daily. 30 tablet 1  . atorvastatin (LIPITOR) 40 MG tablet Take 1 tablet (40 mg total) by mouth daily. 30 tablet 1  . B Complex-C (B-COMPLEX WITH VITAMIN C) tablet Take 1 tablet by mouth daily.      09/06/2019 BYSTOLIC 20 MG TABS TAKE 1 TABLET EVERY MORNING 90 tablet 3  . Cholecalciferol (VITAMIN D PO) Take 5,000  Units by mouth daily.     . cloNIDine (CATAPRES) 0.1 MG tablet Take 1 tablet (0.1 mg total) by mouth 2 (two) times daily. (Patient taking differently: Take 0.5 mg by mouth daily as needed (high blood pressure). ) 180 tablet 1  . co-enzyme Q-10 30 MG capsule Take 30 mg by mouth daily.    . hydrALAZINE (APRESOLINE) 50 MG tablet TAKE 1 TABLET THREE TIMES A DAY (Patient taking differently: Take 50  mg by mouth in the morning and at bedtime. ) 270 tablet 3  . Moringa Oleifera (MORINGA PO) Take 1 packet by mouth daily.     . MULTIPLE VITAMIN PO Take 1 tablet by mouth daily.     Marland Kitchen NATTOKINASE PO Take 1 tablet by mouth every evening. Helps blood flow (Patient not taking: Reported on 09/19/2019)    . NON FORMULARY Chlorella:  Takes 15 capsules daily    . NON FORMULARY Super Beet powder that she adds to her smoothie    . OVER THE COUNTER MEDICATION Take 1 packet by mouth daily. Mushroom supplement    . Probiotic Product (PROBIOTIC PO) Take 1 tablet by mouth daily. Bifido Beadlet 50+    . SPIRULINA PO Take 1 teaspoon by mouth daily     No current facility-administered medications on file prior to visit.    ALLERGIES: Allergies  Allergen Reactions  . Isovue [Iopamidol] Itching    Pt states she had itching after CT in 2015.  Marland Kitchen Penicillins Swelling    Has patient had a PCN reaction causing immediate rash, facial/tongue/throat swelling, SOB or lightheadedness with hypotension: yes Has patient had a PCN reaction causing severe rash involving mucus membranes or skin necrosis: no Has patient had a PCN reaction that required hospitalization no Has patient had a PCN reaction occurring within the last 10 years: no If all of the above answers are "NO", then may proceed with Cephalosporin use.   . Sulfa Antibiotics Itching    FAMILY HISTORY: Family History  Problem Relation Age of Onset  . Heart failure Mother   . Heart disease Mother   . Diabetes Mother   . Hypertension Mother   . Hypertension Father   . Diabetes Maternal Grandmother   . Hypertension Sister   . Hypertension Brother   . Cancer Brother        Prostate  . Hypertension Sister     SOCIAL HISTORY: Social History   Socioeconomic History  . Marital status: Widowed    Spouse name: Not on file  . Number of children: Not on file  . Years of education: Not on file  . Highest education level: Not on file  Occupational  History  . Not on file  Tobacco Use  . Smoking status: Never Smoker  . Smokeless tobacco: Never Used  Vaping Use  . Vaping Use: Never assessed  Substance and Sexual Activity  . Alcohol use: Yes    Comment: Social Use only  . Drug use: No  . Sexual activity: Not Currently  Other Topics Concern  . 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 Difficulty of Paying Living Expenses:   Food Insecurity:   . Worried About Programme researcher, broadcasting/film/video in the Last Year:   . Barista in the Last Year:   Transportation Needs:   . Freight forwarder (Medical):   Marland Kitchen Lack of Transportation (Non-Medical):   Physical Activity:   . Days of Exercise per Week:   . Minutes of Exercise per Session:   Stress:   . Feeling of Stress :   Social Connections:   . Frequency of Communication with Friends and Family:   . Frequency of Social Gatherings with Friends and Family:   . Attends Religious Services:   . Active Member of Clubs or Organizations:   . Attends Banker Meetings:   Marland Kitchen Marital Status:   Intimate Partner Violence:   . Fear of Current or Ex-Partner:   . Emotionally Abused:   Marland Kitchen Physically Abused:   . Sexually Abused:    PHYSICAL EXAM: Blood pressure (!) 164/105, pulse 74, height 5\' 4"  (1.626 m), weight 183 lb (83 kg), SpO2 97 %. General: No acute distress.  Patient appears well-groomed.  Head:  Normocephalic/atraumatic Eyes:  fundi examined but not visualized Neck: supple, no paraspinal tenderness, full range of motion Back: No paraspinal tenderness Heart: regular rate and rhythm Lungs: Clear to  auscultation bilaterally. Vascular: No carotid bruits. Neurological Exam: Mental status: alert and oriented to person, place, and time, recent and remote memory intact, fund of knowledge intact, attention and concentration intact, speech fluent and not dysarthric, language intact. Cranial nerves: CN I: not tested CN II: pupils equal, round and reactive to light, visual fields intact CN III, IV, VI:  full range of motion, no nystagmus, no ptosis CN V: facial sensation intact CN VII: upper and lower face symmetric CN VIII: hearing intact CN IX, X: gag intact, uvula midline CN XI: sternocleidomastoid and trapezius muscles intact CN XII: tongue midline Bulk & Tone: normal, no fasciculations. Motor:  5/5 throughout  Sensation:  Pinprick and vibration sensation intact.   Deep Tendon Reflexes:  2+ throughout, toes downgoing.   Finger to nose testing:  Without dysmetria.   Heel to shin:  Without dysmetria.   Gait:  Normal station and stride.  Romberg negative .  IMPRESSION: 1.  Left thalamocapsular infarct secondary to small vessel disease 2.  Intracranial stenosis 3.  Hypertension  PLAN: 1.  Continue ASA 81mg  daily for secondary stroke prevention 2.  Will start her on lower dose of atorvastatin 10mg  daily (LDL goal less than 70).  Repeat lipid panel in 3 months. 3.  Follow up with Dr. regarding blood pressure control. 4.  Mediterranean diet 5.  Follow up in 4 to 6 months.  Thank you for allowing me to take part in the care of this patient.  , DO  CC: , MD

## 2019-10-05 ENCOUNTER — Encounter: Payer: Self-pay | Admitting: Neurology

## 2019-10-05 ENCOUNTER — Ambulatory Visit (INDEPENDENT_AMBULATORY_CARE_PROVIDER_SITE_OTHER): Payer: Medicare Other | Admitting: Neurology

## 2019-10-05 ENCOUNTER — Other Ambulatory Visit: Payer: Self-pay

## 2019-10-05 VITALS — BP 164/105 | HR 74 | Ht 64.0 in | Wt 183.0 lb

## 2019-10-05 DIAGNOSIS — I10 Essential (primary) hypertension: Secondary | ICD-10-CM | POA: Diagnosis not present

## 2019-10-05 DIAGNOSIS — I639 Cerebral infarction, unspecified: Secondary | ICD-10-CM

## 2019-10-05 DIAGNOSIS — I679 Cerebrovascular disease, unspecified: Secondary | ICD-10-CM | POA: Diagnosis not present

## 2019-10-05 MED ORDER — ATORVASTATIN CALCIUM 10 MG PO TABS: 10.0000 mg | ORAL_TABLET | Freq: Every day | ORAL | 5 refills | Status: DC

## 2019-10-05 NOTE — Patient Instructions (Signed)
1.  Continue aspirin 81mg  daily 2.  Start atorvastatin 10mg  daily.  Recheck lipid panel in 3 months. 3.  Follow up with Dr. regarding blood pressure 4.  Mediterranean diet (See below) 5.  Follow up in 4 to 6 months   Mediterranean Diet A Mediterranean diet refers to food and lifestyle choices that are based on the traditions of countries located on the . This way of eating has been shown to help prevent certain conditions and improve outcomes for people who have chronic diseases, like kidney disease and heart disease. What are tips for following this plan? Lifestyle  Cook and eat meals together with your family, when possible.  Drink enough fluid to keep your urine clear or pale yellow.  Be physically active every day. This includes: ? Aerobic exercise like running or swimming. ? Leisure activities like gardening, walking, or housework.  Get 7-8 hours of sleep each night.  If recommended by your health care provider, drink red wine in moderation. This means 1 glass a day for nonpregnant women and 2 glasses a day for men. A glass of wine equals 5 oz (150 mL). Reading food labels   Check the serving size of packaged foods. For foods such as rice and pasta, the serving size refers to the amount of cooked product, not dry.  Check the total fat in packaged foods. Avoid foods that have saturated fat or trans fats.  Check the ingredients list for added sugars, such as corn syrup. Shopping  At the grocery store, buy most of your food from the areas near the walls of the store. This includes: ? Fresh fruits and vegetables (produce). ? Grains, beans, nuts, and seeds. Some of these may be available in unpackaged forms or large amounts (in bulk). ? Fresh seafood. ? Poultry and eggs. ? Low-fat dairy products.  Buy whole ingredients instead of prepackaged foods.  Buy fresh fruits and vegetables in-season from local farmers markets.  Buy frozen fruits and  vegetables in resealable bags.  If you do not have access to quality fresh seafood, buy precooked frozen shrimp or canned fish, such as tuna, salmon, or sardines.  Buy small amounts of raw or cooked vegetables, salads, or olives from the deli or salad bar at your store.  Stock your pantry so you always have certain foods on hand, such as olive oil, canned tuna, canned tomatoes, rice, pasta, and beans. Cooking  Cook foods with extra-virgin olive oil instead of using butter or other vegetable oils.  Have meat as a side dish, and have vegetables or grains as your main dish. This means having meat in small portions or adding small amounts of meat to foods like pasta or stew.  Use beans or vegetables instead of meat in common dishes like chili or lasagna.  Experiment with different cooking methods. Try roasting or broiling vegetables instead of steaming or sauteing them.  Add frozen vegetables to soups, stews, pasta, or rice.  Add nuts or seeds for added healthy fat at each meal. You can add these to yogurt, salads, or vegetable dishes.  Marinate fish or vegetables using olive oil, lemon juice, garlic, and fresh herbs. Meal planning   Plan to eat 1 vegetarian meal one day each week. Try to work up to 2 vegetarian meals, if possible.  Eat seafood 2 or more times a week.  Have healthy snacks readily available, such as: ? Vegetable sticks with hummus. ? Fabian Sharp yogurt. ? Fruit and nut trail mix.  Eat  balanced meals throughout the week. This includes: ? Fruit: 2-3 servings a day ? Vegetables: 4-5 servings a day ? Low-fat dairy: 2 servings a day ? Fish, poultry, or lean meat: 1 serving a day ? Beans and legumes: 2 or more servings a week ? Nuts and seeds: 1-2 servings a day ? Whole grains: 6-8 servings a day ? Extra-virgin olive oil: 3-4 servings a day  Limit red meat and sweets to only a few servings a month What are my food choices?  Mediterranean diet ? Recommended  Grains:  Whole-grain pasta. Brown rice. Bulgar wheat. Polenta. Couscous. Whole-wheat bread. Orpah Cobb.  Vegetables: Artichokes. Beets. Broccoli. Cabbage. Carrots. Eggplant. Green beans. Chard. Kale. Spinach. Onions. Leeks. Peas. Squash. Tomatoes. Peppers. Radishes.  Fruits: Apples. Apricots. Avocado. Berries. Bananas. Cherries. Dates. Figs. Grapes. Lemons. Melon. Oranges. Peaches. Plums. Pomegranate.  Meats and other protein foods: Beans. Almonds. Sunflower seeds. Pine nuts. Peanuts. Cod. Salmon. Scallops. Shrimp. Tuna. Tilapia. Clams. Oysters. Eggs.  Dairy: Low-fat milk. Cheese. Greek yogurt.  Beverages: Water. Red wine. Herbal tea.  Fats and oils: Extra virgin olive oil. Avocado oil. Grape seed oil.  Sweets and desserts: [place] yogurt with honey. Baked apples. Poached pears. Trail mix.  Seasoning and other foods: Basil. Cilantro. Coriander. Cumin. Mint. Parsley. Sage. Rosemary. Tarragon. Garlic. Oregano. Thyme. Pepper. Balsalmic vinegar. Tahini. Hummus. Tomato sauce. Olives. Mushrooms. ? Limit these  Grains: Prepackaged pasta or rice dishes. Prepackaged cereal with added sugar.  Vegetables: Deep fried potatoes (french fries).  Fruits: Fruit canned in syrup.  Meats and other protein foods: Beef. Pork. Lamb. Poultry with skin. Hot dogs. Tomasa Blase.  Dairy: Ice cream. Sour cream. Whole milk.  Beverages: Juice. Sugar-sweetened soft drinks. Beer. Liquor and spirits.  Fats and oils: Butter. Canola oil. Vegetable oil. Beef fat (tallow). Lard.  Sweets and desserts: Cookies. Cakes. Pies. Candy.  Seasoning and other foods: Mayonnaise. Premade sauces and marinades. The items listed may not be a complete list. Talk with your dietitian about what dietary choices are right for you. Summary  The Mediterranean diet includes both food and lifestyle choices.  Eat a variety of fresh fruits and vegetables, beans, nuts, seeds, and whole grains.  Limit the amount of red meat and sweets that you  eat.  Talk with your health care provider about whether it is safe for you to drink red wine in moderation. This means 1 glass a day for nonpregnant women and 2 glasses a day for men. A glass of wine equals 5 oz (150 mL). This information is not intended to replace advice given to you by your health care provider. Make sure you discuss any questions you have with your health care provider. Document Revised: 10/30/2015 Document Reviewed: 10/23/2015 Elsevier Patient Education  2020 [employer].

## 2019-10-08 DIAGNOSIS — I69898 Other sequelae of other cerebrovascular disease: Secondary | ICD-10-CM | POA: Diagnosis not present

## 2019-10-08 DIAGNOSIS — R202 Paresthesia of skin: Secondary | ICD-10-CM | POA: Diagnosis not present

## 2019-10-08 DIAGNOSIS — R29898 Other symptoms and signs involving the musculoskeletal system: Secondary | ICD-10-CM | POA: Diagnosis not present

## 2019-10-08 DIAGNOSIS — I10 Essential (primary) hypertension: Secondary | ICD-10-CM | POA: Diagnosis not present

## 2019-10-08 DIAGNOSIS — H538 Other visual disturbances: Secondary | ICD-10-CM | POA: Diagnosis not present

## 2019-10-08 DIAGNOSIS — F329 Major depressive disorder, single episode, unspecified: Secondary | ICD-10-CM | POA: Diagnosis not present

## 2019-10-10 DIAGNOSIS — E785 Hyperlipidemia, unspecified: Secondary | ICD-10-CM | POA: Diagnosis not present

## 2019-10-10 DIAGNOSIS — I10 Essential (primary) hypertension: Secondary | ICD-10-CM | POA: Diagnosis not present

## 2019-10-10 DIAGNOSIS — I447 Left bundle-branch block, unspecified: Secondary | ICD-10-CM | POA: Diagnosis not present

## 2019-10-10 DIAGNOSIS — R202 Paresthesia of skin: Secondary | ICD-10-CM | POA: Diagnosis not present

## 2019-10-10 DIAGNOSIS — R29898 Other symptoms and signs involving the musculoskeletal system: Secondary | ICD-10-CM | POA: Diagnosis not present

## 2019-10-10 DIAGNOSIS — Z7982 Long term (current) use of aspirin: Secondary | ICD-10-CM | POA: Diagnosis not present

## 2019-10-10 DIAGNOSIS — H538 Other visual disturbances: Secondary | ICD-10-CM | POA: Diagnosis not present

## 2019-10-10 DIAGNOSIS — M1990 Unspecified osteoarthritis, unspecified site: Secondary | ICD-10-CM | POA: Diagnosis not present

## 2019-10-10 DIAGNOSIS — Z7902 Long term (current) use of antithrombotics/antiplatelets: Secondary | ICD-10-CM | POA: Diagnosis not present

## 2019-10-10 DIAGNOSIS — Z6832 Body mass index (BMI) 32.0-32.9, adult: Secondary | ICD-10-CM | POA: Diagnosis not present

## 2019-10-10 DIAGNOSIS — F329 Major depressive disorder, single episode, unspecified: Secondary | ICD-10-CM | POA: Diagnosis not present

## 2019-10-10 DIAGNOSIS — R2681 Unsteadiness on feet: Secondary | ICD-10-CM | POA: Diagnosis not present

## 2019-10-10 DIAGNOSIS — E669 Obesity, unspecified: Secondary | ICD-10-CM | POA: Diagnosis not present

## 2019-10-10 DIAGNOSIS — I2510 Atherosclerotic heart disease of native coronary artery without angina pectoris: Secondary | ICD-10-CM | POA: Diagnosis not present

## 2019-10-10 DIAGNOSIS — E039 Hypothyroidism, unspecified: Secondary | ICD-10-CM | POA: Diagnosis not present

## 2019-10-10 DIAGNOSIS — I69898 Other sequelae of other cerebrovascular disease: Secondary | ICD-10-CM | POA: Diagnosis not present

## 2019-10-11 DIAGNOSIS — R29898 Other symptoms and signs involving the musculoskeletal system: Secondary | ICD-10-CM | POA: Diagnosis not present

## 2019-10-11 DIAGNOSIS — F329 Major depressive disorder, single episode, unspecified: Secondary | ICD-10-CM | POA: Diagnosis not present

## 2019-10-11 DIAGNOSIS — I10 Essential (primary) hypertension: Secondary | ICD-10-CM | POA: Diagnosis not present

## 2019-10-11 DIAGNOSIS — R202 Paresthesia of skin: Secondary | ICD-10-CM | POA: Diagnosis not present

## 2019-10-11 DIAGNOSIS — I69898 Other sequelae of other cerebrovascular disease: Secondary | ICD-10-CM | POA: Diagnosis not present

## 2019-10-11 DIAGNOSIS — H538 Other visual disturbances: Secondary | ICD-10-CM | POA: Diagnosis not present

## 2019-10-23 ENCOUNTER — Ambulatory Visit: Payer: Medicare Other | Admitting: Cardiovascular Disease

## 2019-11-07 ENCOUNTER — Inpatient Hospital Stay: Admitting: Neurology

## 2019-12-10 NOTE — Progress Notes (Signed)
Virtual Visit via Video Note   This visit type was conducted due to national recommendations for restrictions regarding the COVID-19 Pandemic (e.g. social distancing) in an effort to limit this patient's exposure and mitigate transmission in our community.  Due to her co-morbid illnesses, this patient is at least at moderate risk for complications without adequate follow up.  This format is felt to be most appropriate for this patient at this time.  All issues noted in this document were discussed and addressed.  A limited physical exam was performed with this format.  Please refer to the patient's chart for her consent to telehealth for [facility].       Date:  12/11/2019   ID:  [name], DOB 1950/11/01, MRN [id number] The patient was identified using 2 identifiers.  Patient Location: Home Provider Location: Office/Clinic  PCP:  Madelin Headings, MD  Cardiologist:  Rollene Rotunda, MD  Electrophysiologist:  None   Evaluation Performed:  Follow-Up Visit  Chief Complaint:  CVA  History of Present Illness:    [name] is a 69 y.o. female with difficult to control hypertension.  She has been intolerant of multiple medications.  She had a rash with spironolactone at 1 point.  Since I last saw her she was in the hospital earlier this year with a small anterolateral left thalamic infarct.  I reviewed these records.  She had some hypertensive urgency.  She did have an echocardiogram with an ejection fraction of 55%.  There were no significant valvular abnormalities.  She had some diastolic dysfunction.  She had negative carotids.  She was sent home for a brief time with aspirin and Plavix but did not tolerate the Plavix.  She has been on aspirin.  She is taking her blood pressure readings and is taking clonidine 0.1 mg twice a day.  This makes her fatigued but she is taking it.  She is taking the other meds as listed below her blood pressures usually well controlled.   She is not seeing any of the systolics as high as 200 or 190 which she had not infrequently before.  She might get systolics in the 160s but it typically comes down sometimes in the 1 20-1 30 range.  Diastolics seem to be well controlled.  She has no new shortness of breath, PND or orthopnea.  She has no new palpitations, presyncope or syncope.  The patient does not have symptoms concerning for COVID-19 infection (fever, chills, cough, or new shortness of breath).    Past Medical History:  Diagnosis Date  . Arthritis   . Chicken pox   . Depression   . Diverticulitis   . Elevated urinary free cortisol level 03/25/2014   mild with low potasssium  endo consult   disc with patient    . Hypertension   . Sciatica   . Seasonal allergies   . Stroke (HCC) 09/2008  . Thyroid disease    Past Surgical History:  Procedure Laterality Date  . MINOR BREAST BIOPSY  1979  . WISDOM TOOTH EXTRACTION       Current Meds  Medication Sig  . Ascorbic Acid (VITAMIN C) 1000 MG tablet Take 1,000 mg by mouth every morning.   . ASHWAGANDHA PO Take 1 capsule by mouth daily.   Marland Kitchen aspirin 81 MG chewable tablet Chew 1 tablet (81 mg total) by mouth daily.  Marland Kitchen atorvastatin (LIPITOR) 10 MG tablet Take 1 tablet (10 mg total) by mouth daily.  . B Complex-C (B-COMPLEX WITH  VITAMIN C) tablet Take 1 tablet by mouth daily.    Marland Kitchen BYSTOLIC 20 MG TABS TAKE 1 TABLET EVERY MORNING  . Cholecalciferol (VITAMIN D PO) Take 5,000 Units by mouth daily.   . cloNIDine (CATAPRES) 0.1 MG tablet Take 1 tablet (0.1 mg total) by mouth 2 (two) times daily. (Patient taking differently: Take 0.5 mg by mouth daily as needed (high blood pressure). )  . co-enzyme Q-10 30 MG capsule Take 30 mg by mouth daily.  . hydrALAZINE (APRESOLINE) 50 MG tablet TAKE 1 TABLET THREE TIMES A DAY (Patient taking differently: Take 50 mg by mouth 3 (three) times daily. )  . Moringa Oleifera (MORINGA PO) Take 1 packet by mouth daily.   . MULTIPLE VITAMIN PO Take 1  tablet by mouth daily.   Marland Kitchen NATTOKINASE PO Take 1 tablet by mouth every evening. Helps blood flow  . NON FORMULARY Chlorella:  Takes 15 capsules daily  . NON FORMULARY Super Beet powder that she adds to her smoothie  . OVER THE COUNTER MEDICATION Take 1 packet by mouth daily. Mushroom supplement  . Probiotic Product (PROBIOTIC PO) Take 1 tablet by mouth daily. Bifido Beadlet 50+  . SPIRULINA PO Take 1 teaspoon by mouth daily     Allergies:   Isovue [iopamidol], Penicillins, and Sulfa antibiotics   Social History   Tobacco Use  . Smoking status: Never Smoker  . Smokeless tobacco: Never Used  Vaping Use  . Vaping Use: Never assessed  Substance Use Topics  . Alcohol use: Yes    Comment: Social Use only  . Drug use: No     Family Hx: The patient's family history includes Cancer in her brother; Diabetes in her maternal grandmother and mother; Heart disease in her mother; Heart failure in her mother; Hypertension in her brother, father, mother, sister, and sister.  ROS:   Please see the history of present illness.    none All other systems reviewed and are negative.   Prior CV studies:   The following studies were reviewed today:  Hospital records, echocardiogram  Labs/Other Tests and Data Reviewed:    EKG:  No ECG reviewed.  Recent Labs: 09/05/2019: ALT 17; Hemoglobin 14.2; Magnesium 2.2; Platelets 171 09/06/2019: BUN 19; Creatinine, Ser 1.18; Potassium 3.7; Sodium 138   Recent Lipid Panel Lab Results  Component Value Date/Time   CHOL 179 09/04/2019 05:20 AM   TRIG 80 09/04/2019 05:20 AM   HDL 63 09/04/2019 05:20 AM   CHOLHDL 2.8 09/04/2019 05:20 AM   LDLCALC 100 (H) 09/04/2019 05:20 AM    Wt Readings from Last 3 Encounters:  12/11/19 179 lb (81.2 kg)  10/05/19 183 lb (83 kg)  09/19/19 184 lb (83.5 kg)     Objective:    Vital Signs:  BP 133/84   Pulse (!) 52   Ht 5\' 3"  (1.6 m)   Wt 179 lb (81.2 kg)   SpO2 98%   BMI 31.71 kg/m    VITAL SIGNS:   reviewed GEN:  no acute distress EYES:  sclerae anicteric, EOMI - Extraocular Movements Intact NEURO:  alert and oriented x 3, no obvious focal deficit PSYCH:  normal affect  ASSESSMENT & PLAN:    CVA: The patient has some vague mild residual symptoms but overall seems to have done well.  No change in therapy.  She is followed by neurology.  HTN: We had a long discussion about this.  I think she is on the right medications.  We talked about as needed  dosing of her clonidine.  For now she will continue the meds as listed.  LBBB: This is chronic.  No change in therapy.  She had the echo as above.  COVID-19 Education: The signs and symptoms of COVID-19 were discussed with the patient and how to seek care for testing (follow up with PCP or arrange E-visit).  We discussed the vaccine and she still has not had this.  She might consider it.    Time:   Today, I have spent 25 minutes with the patient and the chart with greater than half the time with the patient in face-to-face with telehealth technology discussing the above problems.     Medication Adjustments/Labs and Tests Ordered: Current medicines are reviewed at length with the patient today.  Concerns regarding medicines are outlined above.   Tests Ordered: No orders of the defined types were placed in this encounter.   Medication Changes: No orders of the defined types were placed in this encounter.   Follow Up:  In Person or virtual in six months  Signed, Rollene Rotunda, MD  12/11/2019 5:45 PM    [facility]

## 2019-12-11 ENCOUNTER — Encounter: Payer: Self-pay | Admitting: Cardiology

## 2019-12-11 ENCOUNTER — Telehealth (INDEPENDENT_AMBULATORY_CARE_PROVIDER_SITE_OTHER): Payer: Medicare Other | Admitting: Cardiology

## 2019-12-11 VITALS — BP 133/84 | HR 52 | Ht 63.0 in | Wt 179.0 lb

## 2019-12-11 DIAGNOSIS — I10 Essential (primary) hypertension: Secondary | ICD-10-CM

## 2019-12-11 DIAGNOSIS — Z7189 Other specified counseling: Secondary | ICD-10-CM

## 2019-12-11 DIAGNOSIS — I639 Cerebral infarction, unspecified: Secondary | ICD-10-CM

## 2019-12-11 DIAGNOSIS — I447 Left bundle-branch block, unspecified: Secondary | ICD-10-CM | POA: Diagnosis not present

## 2019-12-11 DIAGNOSIS — I493 Ventricular premature depolarization: Secondary | ICD-10-CM

## 2019-12-11 NOTE — Patient Instructions (Signed)
Medication Instructions:  Your physician recommends that you continue on your current medications as directed. Please refer to the Current Medication list given to you today.  *If you need a refill on your cardiac medications before your next appointment, please call your pharmacy*  Lab Work: NONE ordered at this time of appointment   If you have labs (blood work) drawn today and your tests are completely normal, you will receive your results only by: Marland Kitchen MyChart Message (if you have MyChart) OR . A paper copy in the mail If you have any lab test that is abnormal or we need to change your treatment, we will call you to review the results.  Testing/Procedures: NONE ordered at this time of appointment   Follow-Up: At [facility], you and your health needs are our priority.  As part of our continuing mission to provide you with exceptional heart care, we have created designated Provider Care Teams.  These Care Teams include your primary Cardiologist (physician) and Advanced Practice Providers (APPs -  Physician Assistants and Nurse Practitioners) who all work together to provide you with the care you need, when you need it.  Your next appointment:    6 month(s)  The format for your next appointment:   Either in person or virtual   Provider:   Rollene Rotunda, MD  Other Instructions

## 2020-01-17 ENCOUNTER — Ambulatory Visit: Payer: Medicare Other | Admitting: Cardiovascular Disease

## 2020-02-19 NOTE — Progress Notes (Signed)
NEUROLOGY FOLLOW UP OFFICE NOTE  [name] [id number]   Subjective:  [name] is a 69 year old right-handed female with HTN and thyroid disease who follows up for stroke. Marland Kitchen UPDATE: Current mediations:  ASA 81mg , clonidiine, hydralazine  Has some stinging in the middle finger of the right hand.  No numbness.  Hand feels stiff.  Started her on atorvastatin 10mg  but she has not been taking it, because she wanted to try natural supplements.  HISTORY: She has residual right hand and foot paresthesias since a stroke in 2010.  However, she had not remained on antiplatelet therapy.  She was admitted to the hospital on 09/03/2019 after presenting with worsening right sided upper and lower extremity paresthesias and heaviness.  Blood pressure was 200s/100s.  CT head showed no acute abnormality but MRI of brain showed small infarct involving the left thalamus and posterior limb of internal capsule.  MRA of head showed intracranial atherosclerosis and stenosis but no large vessel occlusion.  Carotid doppler showed 1-39% bilateral ICA stenosis and antegrade flow of both vertebral arteries.  2D Echo showed EF 50-55% with no cardiac source of emoblus.  LDL was 100 and Hgb A1c was 5.4.  She was discharged on ASA 81mg  and Plavix 75mg  daily for 3 weeks, followed by ASA alone.  She was discharged on atorvastatin 40mg .  She stopped atorvastatin because it caused itching.  She is still having labile blood pressure.     09/04/2019 MRI BRAIN WO:  1. Small acute infarct of the anterolateral left thalamus, adjacent to the posterior limb of the internal capsule. No acute hemorrhage or mass effect.  2. Findings of chronic ischemic microangiopathy. 09/04/2019 MRA HEAD:  Internal carotid artery patent bilaterally without significant stenosis. Mild atherosclerotic disease on the left. Anterior and middle cerebral arteries patent bilaterally. Severe stenosis inferior division left MCA bifurcation.  Moderate stenosis in the superior division of left MCA. Mild irregularity and M3 branches bilaterally. Anterior cerebral arteries patent bilaterally.  Both vertebral arteries patent to the basilar. PICA patent bilaterally. Left AICA patent. Right AICA not visualized. Mild to moderate stenosis distal basilar. Posterior cerebral arteries patent bilaterally with P3 stenosis bilaterally.  Negative for cerebral aneurysm.  PAST MEDICAL HISTORY: Past Medical History:  Diagnosis Date  . Arthritis   . Chicken pox   . Depression   . Diverticulitis   . Elevated urinary free cortisol level 03/25/2014   mild with low potasssium  endo consult   disc with patient    . Hypertension   . Sciatica   . Seasonal allergies   . Stroke (HCC) 09/2008  . Thyroid disease     MEDICATIONS: Current Outpatient Medications on File Prior to Visit  Medication Sig Dispense Refill  . Ascorbic Acid (VITAMIN C) 1000 MG tablet Take 1,000 mg by mouth every morning.     . ASHWAGANDHA PO Take 1 capsule by mouth daily.     aspirin 81 MG chewable tablet Chew 1 tablet (81 mg total) by mouth daily. 30 tablet 1  . atorvastatin (LIPITOR) 10 MG tablet Take 1 tablet (10 mg total) by mouth daily. 30 tablet 5  . B Complex-C (B-COMPLEX WITH VITAMIN C) tablet Take 1 tablet by mouth daily.      09/06/2019 BYSTOLIC 20 MG TABS TAKE 1 TABLET EVERY MORNING 90 tablet 3  . Cholecalciferol (VITAMIN D PO) Take 5,000 Units by mouth daily.     . cloNIDine (CATAPRES) 0.1 MG tablet Take 1 tablet (0.1 mg total)  by mouth 2 (two) times daily. (Patient taking differently: Take 0.5 mg by mouth daily as needed (high blood pressure). ) 180 tablet 1  . co-enzyme Q-10 30 MG capsule Take 30 mg by mouth daily.    . hydrALAZINE (APRESOLINE) 50 MG tablet TAKE 1 TABLET THREE TIMES A DAY (Patient taking differently: Take 50 mg by mouth 3 (three) times daily. ) 270 tablet 3  . Moringa Oleifera (MORINGA PO) Take 1 packet by mouth daily.     . MULTIPLE VITAMIN PO Take 1 tablet  by mouth daily.     Marland Kitchen NATTOKINASE PO Take 1 tablet by mouth every evening. Helps blood flow    . NON FORMULARY Chlorella:  Takes 15 capsules daily    . NON FORMULARY Super Beet powder that she adds to her smoothie    . OVER THE COUNTER MEDICATION Take 1 packet by mouth daily. Mushroom supplement    . Probiotic Product (PROBIOTIC PO) Take 1 tablet by mouth daily. Bifido Beadlet 50+    . SPIRULINA PO Take 1 teaspoon by mouth daily     No current facility-administered medications on file prior to visit.    ALLERGIES: Allergies  Allergen Reactions  . Isovue [Iopamidol] Itching    Pt states she had itching after CT in 2015.  Marland Kitchen Penicillins Swelling    Has patient had a PCN reaction causing immediate rash, facial/tongue/throat swelling, SOB or lightheadedness with hypotension: yes Has patient had a PCN reaction causing severe rash involving mucus membranes or skin necrosis: no Has patient had a PCN reaction that required hospitalization no Has patient had a PCN reaction occurring within the last 10 years: no If all of the above answers are "NO", then may proceed with Cephalosporin use.   . Sulfa Antibiotics Itching    FAMILY HISTORY: Family History  Problem Relation Age of Onset  . Heart failure Mother   . Heart disease Mother   . Diabetes Mother   . Hypertension Mother   . Hypertension Father   . Diabetes Maternal Grandmother   . Hypertension Sister   . Hypertension Brother   . Cancer Brother        Prostate  . Hypertension Sister    SOCIAL HISTORY: Social History   Socioeconomic History  . Marital status: Widowed    Spouse name: Not on file  . Number of children: Not on file  . Years of education: Not on file  . Highest education level: Not on file  Occupational History  . Not on file  Tobacco Use  . Smoking status: Never Smoker  . Smokeless tobacco: Never Used  Vaping Use  . Vaping Use: Never assessed  Substance and Sexual Activity  . Alcohol use: Yes     Comment: Social Use only  . Drug use: No  . Sexual activity: Not Currently  Other Topics Concern  . 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 Difficulty of Paying Living Expenses: Not on  file  Food Insecurity:   . Worried About Programme researcher, broadcasting/film/video in the Last Year: Not on file  . Ran Out of Food in the Last Year: Not on file  Transportation Needs:   . Lack of Transportation (Medical): Not on file  . Lack of Transportation (Non-Medical): Not on file  Physical Activity:   . Days of Exercise per Week: Not on file  . Minutes of Exercise per Session: Not on file  Stress:   . Feeling of Stress : Not on file  Social Connections:   . Frequency of Communication with Friends and Family: Not on file  . Frequency of Social Gatherings with Friends and Family: Not on file  . Attends Religious Services: Not on file  . Active Member of Clubs or Organizations: Not on file  . Attends Banker Meetings: Not on file  . Marital Status: Not on file  Intimate Partner Violence:   . Fear of Current or Ex-Partner: Not on file  . Emotionally Abused: Not on file  . Physically Abused: Not on file  . Sexually Abused: Not on file     Objective:  Blood pressure (!) 169/108, pulse 68, height 5\' 4"  (1.626 m), weight 178 lb 3.2 oz (80.8 kg), SpO2 96 %. General: No acute distress.  Patient appears well-groomed.   Head:  Normocephalic/atraumatic Eyes:  Fundi examined but not visualized Neck: supple, no paraspinal tenderness, full range of motion Heart:  Regular rate and rhythm Lungs:  Clear to  auscultation bilaterally Back: No paraspinal tenderness Neurological Exam: alert and oriented to person, place, and time. Attention span and concentration intact, recent and remote memory intact, fund of knowledge intact.  Speech fluent and not dysarthric, language intact.  CN II-XII intact. Bulk and tone normal, muscle strength 5/5 throughout.  Sensation to light touch, temperature and vibration intact.  Deep tendon reflexes 2+ throughout, toes downgoing.  Finger to nose and heel to shin testing intact.  Gait normal, Romberg negative.   Assessment/Plan:   1.  Left thalamocapsular infarct secondary to small vessel disease 2.  Intracranial stenosis 3.  Hypertension.  1.  She is willing to start atorvastatin but would like to first try 5mg  daily.  Will recheck lipid panel in 3 months.  Check CMP today. 2.  ASA 81mg  daily for secondary stroke prevention 3.  Optimize blood pressure control.  Follow up with Dr. 4.  Glycemic control  Hgb A1c goal less than 7 5.  Mediterranean diet 6. Routine exercise 7.  Follow up 6 months.  , DO  CC: , MD

## 2020-02-20 ENCOUNTER — Other Ambulatory Visit (INDEPENDENT_AMBULATORY_CARE_PROVIDER_SITE_OTHER): Payer: Medicare Other

## 2020-02-20 ENCOUNTER — Ambulatory Visit (INDEPENDENT_AMBULATORY_CARE_PROVIDER_SITE_OTHER): Payer: Medicare Other | Admitting: Neurology

## 2020-02-20 ENCOUNTER — Other Ambulatory Visit: Payer: Self-pay

## 2020-02-20 ENCOUNTER — Encounter: Payer: Self-pay | Admitting: Neurology

## 2020-02-20 VITALS — BP 169/108 | HR 68 | Ht 64.0 in | Wt 178.2 lb

## 2020-02-20 DIAGNOSIS — I679 Cerebrovascular disease, unspecified: Secondary | ICD-10-CM | POA: Diagnosis not present

## 2020-02-20 DIAGNOSIS — Z79899 Other long term (current) drug therapy: Secondary | ICD-10-CM

## 2020-02-20 DIAGNOSIS — I10 Essential (primary) hypertension: Secondary | ICD-10-CM | POA: Diagnosis not present

## 2020-02-20 DIAGNOSIS — I639 Cerebral infarction, unspecified: Secondary | ICD-10-CM | POA: Diagnosis not present

## 2020-02-20 LAB — LIPID PANEL
Cholesterol: 166 mg/dL (ref 0–200)
HDL: 64.3 mg/dL (ref >39.00)
LDL Cholesterol: 88 mg/dL (ref 0–99)
NonHDL: 101.84
Total CHOL/HDL Ratio: 3
Triglycerides: 70 mg/dL (ref 0.0–149.0)
VLDL: 14 mg/dL (ref 0.0–40.0)

## 2020-02-20 LAB — COMPREHENSIVE METABOLIC PANEL
ALT: 15 U/L (ref 0–35)
AST: 27 U/L (ref 0–37)
Albumin: 4 g/dL (ref 3.5–5.2)
Alkaline Phosphatase: 56 U/L (ref 39–117)
BUN: 12 mg/dL (ref 6–23)
CO2: 29 mEq/L (ref 19–32)
Calcium: 8.9 mg/dL (ref 8.4–10.5)
Chloride: 104 mEq/L (ref 96–112)
Creatinine, Ser: 1.06 mg/dL (ref 0.40–1.20)
GFR: 53.89 mL/min — ABNORMAL LOW (ref >60.00)
Glucose, Bld: 99 mg/dL (ref 70–99)
Potassium: 4.1 mEq/L (ref 3.5–5.1)
Sodium: 138 mEq/L (ref 135–145)
Total Bilirubin: 1.1 mg/dL (ref 0.2–1.2)
Total Protein: 6.8 g/dL (ref 6.0–8.3)

## 2020-02-20 MED ORDER — ATORVASTATIN CALCIUM 10 MG PO TABS: 5.0000 mg | ORAL_TABLET | Freq: Every day | ORAL | 5 refills | Status: DC

## 2020-02-20 NOTE — Patient Instructions (Signed)
1.  Start atorvastatin 5mg  daily.  I want to check CMP today.  Check lipid panel and CMP in 3 months 2.  Follow up with Dr. regarding blood pressure 3.  Follow up 6 months

## 2020-03-28 ENCOUNTER — Telehealth: Payer: Self-pay | Admitting: Internal Medicine

## 2020-03-28 NOTE — Telephone Encounter (Signed)
Left message for patient to call back and schedule Medicare Annual Wellness Visit (AWV) either virtually or in office.   Last AWV no information please schedule at anytime with [facility] Nurse Health Advisor 1 or 2   This should be a 45 minute visit. 

## 2020-04-02 ENCOUNTER — Telehealth: Payer: Self-pay | Admitting: Internal Medicine

## 2020-04-02 NOTE — Telephone Encounter (Signed)
Left a message for the pt to return my call.  

## 2020-04-02 NOTE — Telephone Encounter (Signed)
  Upon record review I do not see attention or mention of Bp control and management for  [name]. The visits readings are  high .    Please contact patient and have her   give [name] an update about her blood pressure control ,   Confirm her med list    and document which team is managing/ monitoring her BP and controlling  medications . (? Dr Antoine Poche)   Record in record for clarification.   Hoping she is doing well. And let us know if we can help. With her medical care

## 2020-04-08 NOTE — Telephone Encounter (Signed)
LVM for the patient to call back. Aleka Twitty,cma  

## 2020-04-08 NOTE — Telephone Encounter (Signed)
Pt returned the call to the office and would like to have a call back. °

## 2020-04-30 ENCOUNTER — Ambulatory Visit (INDEPENDENT_AMBULATORY_CARE_PROVIDER_SITE_OTHER): Payer: Medicare Other

## 2020-04-30 ENCOUNTER — Other Ambulatory Visit: Payer: Self-pay

## 2020-04-30 DIAGNOSIS — Z0000 Encounter for general adult medical examination without abnormal findings: Secondary | ICD-10-CM

## 2020-04-30 NOTE — Patient Instructions (Addendum)
[name] , Thank you for taking time to come for your Medicare Wellness Visit. I appreciate your ongoing commitment to your health goals. Please review the following plan we discussed and let me know if I can assist you in the future.   Screening recommendations/referrals: Colonoscopy: Declined and discussed Mammogram: Declined and discussed Bone Density: Done 06/03/09 Recommended yearly ophthalmology/optometry visit for glaucoma screening and checkup Recommended yearly dental visit for hygiene and checkup  Vaccinations: Influenza vaccine: Declined and discussed Pneumococcal vaccine: declined and discussed Tdap vaccine: Declined and discussed Shingles vaccine: Declined and discussed    Covid-19:Declined and discussed Pt stated she was advised no vaccines at this time  Advanced directives: Will work on taking care of this information  Conditions/risks identified: Lose weight and get healthier to get off some or all  medications   Next appointment: Follow up in one year for your annual wellness visit    Preventive Care 65 Years and Older, Female Preventive care refers to lifestyle choices and visits with your health care provider that can promote health and wellness. What does preventive care include?  A yearly physical exam. This is also called an annual well check.  Dental exams once or twice a year.  Routine eye exams. Ask your health care provider how often you should have your eyes checked.  Personal lifestyle choices, including:  Daily care of your teeth and gums.  Regular physical activity.  Eating a healthy diet.  Avoiding tobacco and drug use.  Limiting alcohol use.  Practicing safe sex.  Taking low-dose aspirin every day.  Taking vitamin and mineral supplements as recommended by your health care provider. What happens during an annual well check? The services and screenings done by your health care provider during your annual well check will  depend on your age, overall health, lifestyle risk factors, and family history of disease. Counseling  Your health care provider may ask you questions about your:  Alcohol use.  Tobacco use.  Drug use.  Emotional well-being.  Home and relationship well-being.  Sexual activity.  Eating habits.  History of falls.  Memory and ability to understand (cognition).  Work and work Astronomer.  Reproductive health. Screening  You may have the following tests or measurements:  Height, weight, and BMI.  Blood pressure.  Lipid and cholesterol levels. These may be checked every 5 years, or more frequently if you are over 55 years old.  Skin check.  Lung cancer screening. You may have this screening every year starting at age 85 if you have a 30-pack-year history of smoking and currently smoke or have quit within the past 15 years.  Fecal occult blood test (FOBT) of the stool. You may have this test every year starting at age 42.  Flexible sigmoidoscopy or colonoscopy. You may have a sigmoidoscopy every 5 years or a colonoscopy every 10 years starting at age 1.  Hepatitis C blood test.  Hepatitis B blood test.  Sexually transmitted disease (STD) testing.  Diabetes screening. This is done by checking your blood sugar (glucose) after you have not eaten for a while (fasting). You may have this done every 1-3 years.  Bone density scan. This is done to screen for osteoporosis. You may have this done starting at age 21.  Mammogram. This may be done every 1-2 years. Talk to your health care provider about how often you should have regular mammograms. Talk with your health care provider about your test results, treatment options, and if necessary, the need for  more tests. Vaccines  Your health care provider may recommend certain vaccines, such as:  Influenza vaccine. This is recommended every year.  Tetanus, diphtheria, and acellular pertussis (Tdap, Td) vaccine. You may need a  Td booster every 10 years.  Zoster vaccine. You may need this after age 40.  Pneumococcal 13-valent conjugate (PCV13) vaccine. One dose is recommended after age 92.  Pneumococcal polysaccharide (PPSV23) vaccine. One dose is recommended after age 51. Talk to your health care provider about which screenings and vaccines you need and how often you need them. This information is not intended to replace advice given to you by your health care provider. Make sure you discuss any questions you have with your health care provider. Document Released: 03/28/2015 Document Revised: 11/19/2015 Document Reviewed: 12/31/2014 Elsevier Interactive Patient Education  2017 [place] Prevention in the Home Falls can cause injuries. They can happen to people of all ages. There are many things you can do to make your home safe and to help prevent falls. What can I do on the outside of my home?  Regularly fix the edges of walkways and driveways and fix any cracks.  Remove anything that might make you trip as you walk through a door, such as a raised step or threshold.  Trim any bushes or trees on the path to your home.  Use bright outdoor lighting.  Clear any walking paths of anything that might make someone trip, such as rocks or tools.  Regularly check to see if handrails are loose or broken. Make sure that both sides of any steps have handrails.  Any raised decks and porches should have guardrails on the edges.  Have any leaves, snow, or ice cleared regularly.  Use sand or salt on walking paths during winter.  Clean up any spills in your garage right away. This includes oil or grease spills. What can I do in the bathroom?  Use night lights.  Install grab bars by the toilet and in the tub and shower. Do not use towel bars as grab bars.  Use non-skid mats or decals in the tub or shower.  If you need to sit down in the shower, use a plastic, non-slip stool.  Keep the floor dry. Clean  up any water that spills on the floor as soon as it happens.  Remove soap buildup in the tub or shower regularly.  Attach bath mats securely with double-sided non-slip rug tape.  Do not have throw rugs and other things on the floor that can make you trip. What can I do in the bedroom?  Use night lights.  Make sure that you have a light by your bed that is easy to reach.  Do not use any sheets or blankets that are too big for your bed. They should not hang down onto the floor.  Have a firm chair that has side arms. You can use this for support while you get dressed.  Do not have throw rugs and other things on the floor that can make you trip. What can I do in the kitchen?  Clean up any spills right away.  Avoid walking on wet floors.  Keep items that you use a lot in easy-to-reach places.  If you need to reach something above you, use a strong step stool that has a grab bar.  Keep electrical cords out of the way.  Do not use floor polish or wax that makes floors slippery. If you must use wax, use non-skid  floor wax.  Do not have throw rugs and other things on the floor that can make you trip. What can I do with my stairs?  Do not leave any items on the stairs.  Make sure that there are handrails on both sides of the stairs and use them. Fix handrails that are broken or loose. Make sure that handrails are as long as the stairways.  Check any carpeting to make sure that it is firmly attached to the stairs. Fix any carpet that is loose or worn.  Avoid having throw rugs at the top or bottom of the stairs. If you do have throw rugs, attach them to the floor with carpet tape.  Make sure that you have a light switch at the top of the stairs and the bottom of the stairs. If you do not have them, ask someone to add them for you. What else can I do to help prevent falls?  Wear shoes that:  Do not have high heels.  Have rubber bottoms.  Are comfortable and fit you well.  Are  closed at the toe. Do not wear sandals.  If you use a stepladder:  Make sure that it is fully opened. Do not climb a closed stepladder.  Make sure that both sides of the stepladder are locked into place.  Ask someone to hold it for you, if possible.  Clearly mark and make sure that you can see:  Any grab bars or handrails.  First and last steps.  Where the edge of each step is.  Use tools that help you move around (mobility aids) if they are needed. These include:  Canes.  Walkers.  Scooters.  Crutches.  Turn on the lights when you go into a dark area. Replace any light bulbs as soon as they burn out.  Set up your furniture so you have a clear path. Avoid moving your furniture around.  If any of your floors are uneven, fix them.  If there are any pets around you, be aware of where they are.  Review your medicines with your doctor. Some medicines can make you feel dizzy. This can increase your chance of falling. Ask your doctor what other things that you can do to help prevent falls. This information is not intended to replace advice given to you by your health care provider. Make sure you discuss any questions you have with your health care provider. Document Released: 12/26/2008 Document Revised: 08/07/2015 Document Reviewed: 04/05/2014 Elsevier Interactive Patient Education  2017 [employer].

## 2020-04-30 NOTE — Progress Notes (Signed)
Virtual Visit via Telephone Note  I connected with  [name] on 04/30/20 at 11:45 AM EST by telephone and verified that I am speaking with the correct person using two identifiers.  Medicare Annual Wellness visit completed telephonically due to Covid-19 pandemic.   Persons participating in this call: This Health Coach and this patient.   Location: Patient: Home Provider: Office    I discussed the limitations, risks, security and privacy concerns of performing an evaluation and management service by telephone and the availability of in person appointments. The patient expressed understanding and agreed to proceed.  Unable to perform video visit due to video visit attempted and failed and/or patient does not have video capability.   Some vital signs may be absent or patient reported.   Krystal Schlein, LPN    Subjective:   [name] is a 70 y.o. female who presents for an Initial Medicare Annual Wellness Visit.  Review of Systems     Cardiac Risk Factors include: advanced age (>20men, >62 women);hypertension;obesity (BMI >30kg/m2);dyslipidemia     Objective:    Today's Vitals   04/30/20 1138  PainSc: 10-Worst pain ever   There is no height or weight on file to calculate BMI.  Advanced Directives 04/30/2020 02/20/2020 10/05/2019 09/04/2019 09/03/2019 09/28/2016 06/25/2015  Does Patient Have a Medical Advance Directive? No No No No No No No  Would patient like information on creating a medical advance directive? - - - No - Patient declined No - Patient declined No - Patient declined -  Pre-existing out of facility DNR order (yellow form or pink MOST form) - - - - - - -    Current Medications (verified) Outpatient Encounter Medications as of 04/30/2020  Medication Sig  . Ascorbic Acid (VITAMIN C) 1000 MG tablet Take 1,000 mg by mouth every morning.  Marland Kitchen aspirin 81 MG chewable tablet Chew 1 tablet (81 mg total) by mouth daily.  . B Complex-C (B-COMPLEX WITH  VITAMIN C) tablet Take 1 tablet by mouth daily.  Marland Kitchen BYSTOLIC 20 MG TABS TAKE 1 TABLET EVERY MORNING  . Cholecalciferol (VITAMIN D PO) Take 5,000 Units by mouth daily.   . cloNIDine (CATAPRES) 0.1 MG tablet Take 1 tablet (0.1 mg total) by mouth 2 (two) times daily. (Patient taking differently: Take 0.5 mg by mouth daily as needed (high blood pressure).)  . co-enzyme Q-10 30 MG capsule Take 30 mg by mouth daily.  . hydrALAZINE (APRESOLINE) 50 MG tablet TAKE 1 TABLET THREE TIMES A DAY (Patient taking differently: Take 50 mg by mouth 3 (three) times daily.)  . Moringa Oleifera (MORINGA PO) Take 1 packet by mouth daily.   . MULTIPLE VITAMIN PO Take 1 tablet by mouth daily.   Marland Kitchen NATTOKINASE PO Take 1 tablet by mouth every evening. Helps blood flow  . NON FORMULARY Chlorella:  Takes 15 capsules daily  . NON FORMULARY Super Beet powder that she adds to her smoothie  . OVER THE COUNTER MEDICATION Take 1 packet by mouth daily. Mushroom supplement  . Probiotic Product (PROBIOTIC PO) Take 1 tablet by mouth daily. Bifido Beadlet 50+  . SPIRULINA PO Take 1 teaspoon by mouth daily  . ASHWAGANDHA PO Take 1 capsule by mouth daily.  (Patient not taking: Reported on 04/30/2020)  . [DISCONTINUED] atorvastatin (LIPITOR) 10 MG tablet Take 0.5 tablets (5 mg total) by mouth daily. (Patient not taking: Reported on 04/30/2020)   No facility-administered encounter medications on file as of 04/30/2020.    Allergies (verified) Isovue [iopamidol],  Erythromycin base, Penicillins, and Sulfa antibiotics   History: Past Medical History:  Diagnosis Date  . Arthritis   . Chicken pox   . Depression   . Diverticulitis   . Elevated urinary free cortisol level 03/25/2014   mild with low potasssium  endo consult   disc with patient    . Hypertension   . Sciatica   . Seasonal allergies   . Stroke (HCC) 09/2008  . Thyroid disease    Past Surgical History:  Procedure Laterality Date  . MINOR BREAST BIOPSY  1979  . WISDOM  TOOTH EXTRACTION     Family History  Problem Relation Age of Onset  . Heart failure Mother   . Heart disease Mother   . Diabetes Mother   . Hypertension Mother   . Hypertension Father   . Diabetes Maternal Grandmother   . Hypertension Sister   . Hypertension Brother   . Cancer Brother        Prostate  . Hypertension Sister    Social History   Socioeconomic History  . Marital status: Widowed    Spouse name: Not on file  . Number of children: Not on file  . Years of education: Not on file  . Highest education level: Not on file  Occupational History  . Not on file  Tobacco Use  . Smoking status: Never Smoker  . Smokeless tobacco: Never Used  Vaping Use  . Vaping Use: Not on file  Substance and Sexual Activity  . Alcohol use: Yes    Comment: Social Use only  . Drug use: No  . Sexual activity: Not Currently  Other Topics Concern  . 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 Difficulty of Paying Living Expenses: Not hard at all  Food Insecurity: No Food Insecurity  . Worried About Programme researcher, broadcasting/film/videounning Out of Food in the Last Year: Never true  . Ran Out of Food in the Last Year: Never true  Transportation Needs: No Transportation Needs  . Lack of Transportation (Medical): No  . Lack of Transportation (Non-Medical): No  Physical Activity: Insufficiently Active  . Days of Exercise per Week: 2 days  . Minutes of Exercise per Session: 20 min  Stress: No Stress Concern Present  . Feeling of Stress : Only a little  Social Connections: Moderately Integrated  . Frequency of  Communication with Friends and Family: More than three times a week  . Frequency of Social Gatherings with Friends and Family: Once a week  . Attends Religious Services: More than 4 times per year  . Active Member of Clubs or Organizations: Yes  . Attends BankerClub or Organization Meetings: 1 to 4 times per year  . Marital Status: Widowed    Tobacco Counseling Counseling given: Not Answered   Clinical Intake:  Pre-visit preparation completed: Yes  Pain : 0-10 Pain Score: 10-Worst pain ever Pain Type: Chronic pain Pain Location: Leg (and base of spine) Pain Orientation: Right Pain  Descriptors / Indicators: Sharp,Aching,Tingling (making it hard to sleep) Pain Onset: More than a month ago Pain Frequency: Intermittent     BMI - recorded: 30.59 Nutritional Status: BMI > 30  Obese Diabetes: No  How often do you need to have someone help you when you read instructions, pamphlets, or other written materials from your doctor or pharmacy?: 1 - Never  Diabetic?No  Interpreter Needed?: No  Information entered by :: Lanier Ensign, LPN   Activities of Daily Living In your present state of health, do you have any difficulty performing the following activities: 04/30/2020 09/04/2019  Hearing? N N  Vision? N N  Difficulty concentrating or making decisions? Y N  Comment cncentrating at time -  Walking or climbing stairs? N Y  Dressing or bathing? N N  Doing errands, shopping? N N  Preparing Food and eating ? N -  Using the Toilet? N -  In the past six months, have you accidently leaked urine? N -  Do you have problems with loss of bowel control? N -  Managing your Medications? N -  Managing your Finances? N -  Housekeeping or managing your Housekeeping? N -  Some recent data might be hidden    Patient Care Team: Panosh, Neta Mends, MD as PCP - General (Internal Medicine) Rollene Rotunda, MD as PCP - Cardiology (Cardiology) Jake Bathe, MD as Consulting Physician  (Cardiology) Camille Bal, MD as Consulting Physician (Nephrology)  Indicate any recent Medical Services you may have received from other than [facility] providers in the past year (date may be approximate).     Assessment:   This is a routine wellness examination for [name].  Hearing/Vision screen  Hearing Screening   125Hz  250Hz  500Hz  1000Hz  2000Hz  3000Hz  4000Hz  6000Hz  8000Hz   Right ear:           Left ear:           Comments: Pt denies any hearing   Vision Screening Comments: Pt doesn't follow up with eye exams, encouraged to make appt   Dietary issues and exercise activities discussed: Current Exercise Habits: Home exercise routine, Type of exercise: walking;stretching, Time (Minutes): 20, Frequency (Times/Week): 2, Weekly Exercise (Minutes/Week): 40  Goals    . Patient Stated     Working towards getting healthier to get off meds and lose weight       Depression Screen PHQ 2/9 Scores 04/30/2020 06/27/2015 01/21/2015 01/18/2015 12/29/2014 06/19/2013  PHQ - 2 Score 1 0 0 0 0 0    Fall Risk Fall Risk  04/30/2020 02/20/2020 10/05/2019 11/17/2016 10/11/2016  Falls in the past year? 1 0 0 Yes Yes  Number falls in past yr: 1 0 0 1 1  Injury with Fall? 1 0 0 No No  Risk for fall due to : Impaired vision;Impaired mobility - - - -  Follow up Falls prevention discussed - - - -    FALL RISK PREVENTION PERTAINING TO THE HOME:  Any stairs in or around the home? Yes  If so, are there any without handrails? No  Home free of loose throw rugs in walkways, pet beds, electrical cords, etc? Yes  Adequate lighting in your home to reduce risk of falls? Yes   ASSISTIVE DEVICES UTILIZED TO PREVENT FALLS:  Life alert? No  Use of a cane, walker or w/c? No  Grab bars in the bathroom? No  Shower chair or bench in shower? No  Elevated toilet seat or a handicapped toilet? No   TIMED UP  AND GO:  Was the test performed? No     Cognitive Function:     6CIT Screen 04/30/2020  What Year? 0 points   What month? 0 points  Count back from 20 0 points  Months in reverse 0 points  Repeat phrase 0 points    Immunizations  There is no immunization history on file for this patient.  TDAP status: Due, Education has been provided regarding the importance of this vaccine. Advised may receive this vaccine at local pharmacy or Health Dept. Aware to provide a copy of the vaccination record if obtained from local pharmacy or Health Dept. Verbalized acceptance and understanding.  Flu Vaccine status: Declined, Education has been provided regarding the importance of this vaccine but patient still declined. Advised may receive this vaccine at local pharmacy or Health Dept. Aware to provide a copy of the vaccination record if obtained from local pharmacy or Health Dept. Verbalized acceptance and understanding.  Pneumococcal vaccine status: Declined,  Education has been provided regarding the importance of this vaccine but patient still declined. Advised may receive this vaccine at local pharmacy or Health Dept. Aware to provide a copy of the vaccination record if obtained from local pharmacy or Health Dept. Verbalized acceptance and understanding.   Covid-19 vaccine status: Declined, Education has been provided regarding the importance of this vaccine but patient still declined. Advised may receive this vaccine at local pharmacy or Health Dept.or vaccine clinic. Aware to provide a copy of the vaccination record if obtained from local pharmacy or Health Dept. Verbalized acceptance and understanding.  Qualifies for Shingles Vaccine? Yes   Zostavax completed No   Shingrix Completed?: No.    Education has been provided regarding the importance of this vaccine. Patient has been advised to call insurance company to determine out of pocket expense if they have not yet received this vaccine. Advised may also receive vaccine at local pharmacy or Health Dept. Verbalized acceptance and understanding.  Screening  Tests Health Maintenance  Topic Date Due  . Hepatitis C Screening  Never done  . COVID-19 Vaccine (1) 05/16/2020 (Originally 05/20/1955)  . INFLUENZA VACCINE  06/12/2020 (Originally 10/14/2019)  . MAMMOGRAM  04/30/2021 (Originally 05/20/2001)  . COLONOSCOPY (Pts 45-78yrs Insurance coverage will need to be confirmed)  04/30/2021 (Originally 05/20/1996)  . TETANUS/TDAP  04/30/2021 (Originally 05/21/1970)  . PNA vac Low Risk Adult (1 of 2 - PCV13) 04/30/2021 (Originally 05/20/2016)  . DEXA SCAN  Completed    Health Maintenance  Health Maintenance Due  Topic Date Due  . Hepatitis C Screening  Never done    Colorectal cancer screening: No longer required.  per pt   Mammogram status: No longer required due to pt preference .  Bone Density status: Completed 06/03/09. Results reflect: Bone density results: NORMAL. Repeat every 5 years. Additional Screening:  Hepatitis C Screening: does qualify;  Vision Screening: Recommended annual ophthalmology exams for early detection of glaucoma and other disorders of the eye. Is the patient up to date with their annual eye exam?  No  Who is the provider or what is the name of the office in which the patient attends annual eye exams? Pt encouraged to make an appt toi follow up asap If pt is not established with a provider, would they like to be referred to a provider to establish care? No .   Dental Screening: Recommended annual dental exams for proper oral hygiene  Community Resource Referral / Chronic Care Management: CRR required this visit?  No  CCM required this visit?  No      Plan:     I have personally reviewed and noted the following in the patient's chart:   . Medical and social history . Use of alcohol, tobacco or illicit drugs  . Current medications and supplements . Functional ability and status . Nutritional status . Physical activity . Advanced directives . List of other physicians . Hospitalizations, surgeries, and ER visits in  previous 12 months . Vitals . Screenings to include cognitive, depression, and falls . Referrals and appointments  In addition, I have reviewed and discussed with patient certain preventive protocols, quality metrics, and best practice recommendations. A written personalized care plan for preventive services as well as general preventive health recommendations were provided to patient.     Krystal Schlein, LPN   [id number]   Nurse Notes: Pt stated she has been having aches and pains all over body especially her left leg after waking up. It also causes her to be restless during the night. Pt is also requesting a completed lab work up declined making an appt today however Pt stated she would make an appt to see you on her availability.

## 2020-06-05 ENCOUNTER — Telehealth: Payer: Self-pay | Admitting: Cardiology

## 2020-06-05 ENCOUNTER — Other Ambulatory Visit: Payer: Self-pay

## 2020-06-05 ENCOUNTER — Other Ambulatory Visit: Payer: Self-pay | Admitting: Cardiology

## 2020-06-05 MED ORDER — NEBIVOLOL HCL 20 MG PO TABS
1.0000 | ORAL_TABLET | Freq: Every morning | ORAL | 3 refills | Status: DC
Start: 2020-06-05 — End: 2020-09-03

## 2020-06-05 NOTE — Telephone Encounter (Signed)
 *  STAT* If patient is at the pharmacy, call can be transferred to refill team.   1. Which medications need to be refilled? (please list name of each medication and dose if known)   BYSTOLIC 20 MG TABS  2. Which pharmacy/location (including street and city if local pharmacy) is medication to be sent to?  [facility] - [place], [place] - [address]  3. Do they need a 30 day or 90 day supply? 90 day  Patient is completely out of medication.

## 2020-06-06 ENCOUNTER — Other Ambulatory Visit: Payer: Self-pay | Admitting: Cardiology

## 2020-08-20 NOTE — Progress Notes (Deleted)
NEUROLOGY FOLLOW UP OFFICE NOTE  [name] [id number]  Assessment/Plan:   1.  Left thalamocapsular infarct secondary to small vessel disease 2.  Intracranial stenosis 3.  Hypertension  1.  ***  Subjective:  [name] is a 15 year oldright-handed female with HTN and thyroid disease who follows up for stroke. Marland Kitchen UPDATE: Current mediations: ASA 81mg , clonidiine, hydralazine  LDL was 88.  Started atorvastatin 5mg  in December.  ***  HISTORY: She has residual right hand and foot paresthesias since a stroke in 2010. However, she had not remained on antiplatelet therapy. She was admitted to the hospital on 09/03/2019 after presenting withworseningright sided upper and lower extremity paresthesias and heaviness.Blood pressure was 200s/100s.CT head showed no acute abnormality but MRI of brain showed small infarct involving the left thalamus and posterior limb of internal capsule. MRA of head showed intracranial atherosclerosis and stenosisbut no large vessel occlusion. Carotid doppler showed 1-39% bilateral ICA stenosis and antegrade flow of both vertebral arteries. 2D Echo showed EF 50-55% with no cardiac source of emoblus. LDL was 100 and Hgb A1c was 5.4. She was discharged on ASA 81mg  and Plavix 75mg  daily for 3 weeks, followed by ASA alone. She was discharged on atorvastatin 40mg . She stopped atorvastatin because it caused itching. She is still having labile blood pressure.    09/04/2019 MRI BRAIN WO:1. Small acute infarct of the anterolateral left thalamus, adjacent to the posterior limb of the internal capsule. No acute hemorrhage or mass effect. 2. Findings of chronic ischemic microangiopathy. 09/04/2019 MRA HEAD:Internal carotid artery patent bilaterally without significant stenosis. Mild atherosclerotic disease on the left. Anterior and middle cerebral arteries patent bilaterally. Severe stenosis inferior division left MCA bifurcation.  Moderate stenosis in the superior division of left MCA. Mild irregularity and M3 branches bilaterally. Anterior cerebral arteries patent bilaterally. Both vertebral arteries patent to the basilar. PICA patent bilaterally. Left AICA patent. Right AICA not visualized. Mild to moderate stenosis distal basilar. Posterior cerebral arteries patent bilaterally with P3 stenosis bilaterally. Negative for cerebral aneurysm.  PAST MEDICAL HISTORY: Past Medical History:  Diagnosis Date  . Arthritis   . Chicken pox   . Depression   . Diverticulitis   . Elevated urinary free cortisol level 03/25/2014   mild with low potasssium  endo consult   disc with patient    . Hypertension   . Sciatica   . Seasonal allergies   . Stroke (HCC) 09/2008  . Thyroid disease     MEDICATIONS: Current Outpatient Medications on File Prior to Visit  Medication Sig Dispense Refill  . Ascorbic Acid (VITAMIN C) 1000 MG tablet Take 1,000 mg by mouth every morning.    . ASHWAGANDHA PO Take 1 capsule by mouth daily.  (Patient not taking: Reported on 04/30/2020)    . aspirin 81 MG chewable tablet Chew 1 tablet (81 mg total) by mouth daily. 30 tablet 1  . B Complex-C (B-COMPLEX WITH VITAMIN C) tablet Take 1 tablet by mouth daily.    . Cholecalciferol (VITAMIN D PO) Take 5,000 Units by mouth daily.     . cloNIDine (CATAPRES) 0.1 MG tablet Take 1 tablet (0.1 mg total) by mouth 2 (two) times daily. (Patient taking differently: Take 0.5 mg by mouth daily as needed (high blood pressure).) 180 tablet 1  . co-enzyme Q-10 30 MG capsule Take 30 mg by mouth daily.    . hydrALAZINE (APRESOLINE) 50 MG tablet TAKE 1 TABLET THREE TIMES A DAY 270 tablet 2  . Moringa Oleifera (MORINGA  PO) Take 1 packet by mouth daily.     . MULTIPLE VITAMIN PO Take 1 tablet by mouth daily.     Marland Kitchen NATTOKINASE PO Take 1 tablet by mouth every evening. Helps blood flow    . Nebivolol HCl (BYSTOLIC) 20 MG TABS Take 1 tablet (20 mg total) by mouth every morning. 90  tablet 3  . NON FORMULARY Chlorella:  Takes 15 capsules daily    . NON FORMULARY Super Beet powder that she adds to her smoothie    . OVER THE COUNTER MEDICATION Take 1 packet by mouth daily. Mushroom supplement    . Probiotic Product (PROBIOTIC PO) Take 1 tablet by mouth daily. Bifido Beadlet 50+    . SPIRULINA PO Take 1 teaspoon by mouth daily     No current facility-administered medications on file prior to visit.    ALLERGIES: Allergies  Allergen Reactions  . Isovue [Iopamidol] Itching    Pt states she had itching after CT in 2015.  Marland Kitchen Erythromycin Base   . Penicillins Swelling    Has patient had a PCN reaction causing immediate rash, facial/tongue/throat swelling, SOB or lightheadedness with hypotension: yes Has patient had a PCN reaction causing severe rash involving mucus membranes or skin necrosis: no Has patient had a PCN reaction that required hospitalization no Has patient had a PCN reaction occurring within the last 10 years: no If all of the above answers are "NO", then may proceed with Cephalosporin use.   . Sulfa Antibiotics Itching    FAMILY HISTORY: Family History  Problem Relation Age of Onset  . Heart failure Mother   . Heart disease Mother   . Diabetes Mother   . Hypertension Mother   . Hypertension Father   . Diabetes Maternal Grandmother   . Hypertension Sister   . Hypertension Brother   . Cancer Brother        Prostate  . Hypertension Sister       Objective:  *** General: No acute distress.  Patient appears ***-groomed.   Head:  Normocephalic/atraumatic Eyes:  Fundi examined but not visualized Neck: supple, no paraspinal tenderness, full range of motion Heart:  Regular rate and rhythm Lungs:  Clear to auscultation bilaterally Back: No paraspinal tenderness Neurological Exam: alert and oriented to person, place, and time.  Speech fluent and not dysarthric, language intact.  CN II-XII intact. Bulk and tone normal, muscle strength 5/5 throughout.   Sensation to light touch intact.  Deep tendon reflexes 2+ throughout.  Finger to nose testing intact.  Gait normal, Romberg negative.   Krystal Millet, DO  CC: Berniece Andreas, MD

## 2020-08-22 ENCOUNTER — Ambulatory Visit: Payer: Medicare Other | Admitting: Neurology

## 2020-09-03 ENCOUNTER — Telehealth: Payer: Self-pay | Admitting: Cardiology

## 2020-09-03 MED ORDER — NEBIVOLOL HCL 20 MG PO TABS
1.0000 | ORAL_TABLET | Freq: Every morning | ORAL | 0 refills | Status: DC
Start: 2020-09-03 — End: 2020-09-03

## 2020-09-03 MED ORDER — NEBIVOLOL HCL 20 MG PO TABS
1.0000 | ORAL_TABLET | Freq: Every morning | ORAL | 3 refills | Status: DC
Start: 2020-09-03 — End: 2020-09-12

## 2020-09-03 NOTE — Telephone Encounter (Signed)
Sent in a 30 day supply into the local pharmacy with additional refills until her upcoming appointment in August.

## 2020-09-03 NOTE — Telephone Encounter (Signed)
*  STAT* If patient is at the pharmacy, call can be transferred to refill team.   1. Which medications need to be refilled? (please list name of each medication and dose if known) Nebivolol HCl (BYSTOLIC) 20 MG TABS  2. Which pharmacy/location (including street and city if local pharmacy) is medication to be sent to?   [facility] - [place], [place] - [address]  [id number] - [place], [place] - [address]  3. Do they need a 30 day or 90 day supply? [facility], [facility]   Patient states she has 4 days left and is requesting a 10 day supply be sent to [facility] and a 90 day [facility] to [facility]. She has an appointment 11/06/2020

## 2020-09-12 ENCOUNTER — Telehealth: Payer: Self-pay | Admitting: Cardiology

## 2020-09-12 MED ORDER — NEBIVOLOL HCL 20 MG PO TABS
1.0000 | ORAL_TABLET | Freq: Every morning | ORAL | 2 refills | Status: DC
Start: 2020-09-12 — End: 2020-11-05

## 2020-09-12 NOTE — Telephone Encounter (Signed)
*  STAT* If patient is at the pharmacy, call can be transferred to refill team.   1. Which medications need to be refilled? (please list name of each medication and dose if known) Bystolic  2. Which pharmacy/location (including street and city if local pharmacy) is medication to be sent to?   [facility] - [place], [place] - [address]   3. Do they need a 30 day or 90 day supply? [facility]  Patient states that she does not take the generic brand of Bystolic, she states she normally takes the blue pills and the pills she was given are white. Please advise.

## 2020-11-05 ENCOUNTER — Telehealth: Payer: Self-pay | Admitting: Cardiology

## 2020-11-05 MED ORDER — BYSTOLIC 20 MG PO TABS: 20.0000 mg | ORAL_TABLET | Freq: Every day | ORAL | 3 refills | Status: DC

## 2020-11-05 NOTE — Telephone Encounter (Signed)
Bystolic 20mg  qd, dispense as written, sent into pt pharmacy.  Will forward to Dr. RN to review for virtual visit.

## 2020-11-05 NOTE — Telephone Encounter (Signed)
Pt c/o medication issue:  1. Name of Medication: Nebivolol HCl (BYSTOLIC) 20 MG TABS  2. How are you currently taking this medication (dosage and times per day)? 1 tablet daily  3. Are you having a reaction (difficulty breathing--STAT)? no  4. What is your medication issue? Patient states her pharmacy started giving her the generic for the medication and she wants to know if she can go back to the name brand. She states she has been having some dizziness since taking the generic. She also would like to know if her appointment 12/15/2020 can be virtual, because she does not have a car right now.

## 2020-11-06 ENCOUNTER — Ambulatory Visit: Payer: Medicare Other | Admitting: Cardiology

## 2020-11-07 ENCOUNTER — Telehealth: Payer: Self-pay

## 2020-11-07 NOTE — Telephone Encounter (Signed)
**Note De-Identified Sherley Leser Obfuscation** I started a Bystolic PA through covermymeds. Key: [id number]

## 2020-11-10 ENCOUNTER — Telehealth: Payer: Self-pay | Admitting: Cardiology

## 2020-11-10 ENCOUNTER — Ambulatory Visit: Payer: Medicare Other | Admitting: Cardiology

## 2020-11-10 NOTE — Telephone Encounter (Signed)
Pt c/o medication issue:  1. Name of Medication: BYSTOLIC 20 MG TABS  2. How are you currently taking this medication (dosage and times per day)? 1 tablet by mouth daily  3. Are you having a reaction (difficulty breathing--STAT)? no  4. What is your medication issue? Patient insurance wont cover medication calling to see if there is a generic brand patient can take. Please advise

## 2020-11-10 NOTE — Telephone Encounter (Signed)
Left message for pt to call, ? Can we arrange transportation for her to the appointment. If she does not want to do that, virtual is fine.

## 2020-11-10 NOTE — Telephone Encounter (Signed)
Spoke to pharmacy tech at [facility] stated [employer] will not cover Bystolic.Advised I will send message to Dr.Hochrein for advice of another medication.

## 2020-11-10 NOTE — Telephone Encounter (Signed)
**Note De-Identified [name] Obfuscation** Per covermymeds this name brand Bystolic PA has been denied as follows:  [name] Key: [id number] - PA Case ID: [id number] - Rx #: [id number] Outcome: Denied on August 28 Attention:ATTN: CLINICAL APPEALS DEPARTMENT [facility] [id number]. [phone] Phone:[phone] Fax:[phone]; Important - Please read the below note on eAppeals: Please reference the denial letter for information on the rights for an appeal, rationale for the denial, and how to submit an appeal including if any information is needed to support the appeal. Drug: Bystolic 20MG  tablets Form: [employer] Electronic PA Form (2017 NCPDP) 70 GENERIC SUBSTITUTION REQUIRED  Awaiting denial letter that is to be faxed to our [place] office.

## 2020-11-11 MED ORDER — NEBIVOLOL HCL 20 MG PO TABS: ORAL_TABLET | ORAL | 3 refills | Status: DC

## 2020-11-11 NOTE — Telephone Encounter (Signed)
Called patient left message on personal voice mail generic bystolic is available and is equivalent to brand name bystolic.Prescription sent to [facility].Advised to monitor B/P and if B/P elevated on generic call office.

## 2020-11-11 NOTE — Telephone Encounter (Signed)
Yes generic nebivolol is pharmaceutically equivalent to [employer]

## 2020-11-12 MED ORDER — NEBIVOLOL HCL 20 MG PO TABS
20.0000 mg | ORAL_TABLET | Freq: Every day | ORAL | 3 refills | Status: DC
Start: 2020-11-12 — End: 2021-03-05

## 2020-11-12 NOTE — Telephone Encounter (Signed)
[place] from [employer] is needing a new script to be sent, [place] can be reached at  [phone]

## 2020-11-12 NOTE — Addendum Note (Signed)
Addended by: Tobin Chad on: 11/12/2020 03:16 PM   Modules accepted: Orders

## 2020-11-12 NOTE — Telephone Encounter (Signed)
Spoke to [place] at [facility]  She states the prescription should be for  [employer] as written.  New prescription was e-sent to pharmacy .

## 2020-11-12 NOTE — Telephone Encounter (Signed)
Left message for pt to call.

## 2020-11-19 NOTE — Telephone Encounter (Signed)
Did not realize Dr Antoine Poche patient, first day back at [place] office  Did not see denial letter will forward to Victorio Palm RN covering Dr Antoine Poche  Staff message sent to Dr Antoine Poche to see if he has seen

## 2020-11-26 NOTE — Telephone Encounter (Signed)
**Note De-Identified Emarion Toral Obfuscation** I called the pt to make her aware that her ins plan will not cover name brand Bystolic but got no answer so I left a message on her VM asking her to call Larita Fife back at Dr [employer] office at [facility] at [phone].  I then called [facility] pharmacy and advised Amber that the pts insurance is denying coverage of name brand Bystolic. Per [facility] the pt has been getting generic Nebivolol since it became available  in 2016 and is unsure why she is requesting Bystolic.  Amber states that they will contact the pt to let her know that her Nebivolol refill is ready for pick up and will advise her that her ins will not cover Bystolic. Amber also states that she will instruct the pt to call me if she has any questions.  Amber thanked me for calling them with update.

## 2020-12-14 NOTE — Progress Notes (Deleted)
Cardiology Office Note   Date:  12/14/2020   ID:  [name], DOB 02/06/1951, MRN [id number]  PCP:  Madelin Headings, MD  Cardiologist:   Rollene Rotunda, MD Referring:  ***  No chief complaint on file.     History of Present Illness: [name] is a 70 y.o. female who presents for with difficult to control hypertension.  She has been intolerant of multiple medications.  She had a rash with spironolactone at one point.  She was in the hospital earlier this year with a small anterolateral left thalamic infarct.  I reviewed these records.  She had some hypertensive urgency.  She did have an echocardiogram with an ejection fraction of 55%.  There were no significant valvular abnormalities.  She had some diastolic dysfunction.  She had negative carotids.  She was sent home for a brief time with aspirin and Plavix but did not tolerate the Plavix.  She has been on aspirin.  ***  *** She is taking her blood pressure readings and is taking clonidine 0.1 mg twice a day.  This makes her fatigued but she is taking it.  She is taking the other meds as listed below her blood pressures usually well controlled.  She is not seeing any of the systolics as high as 200 or 190 which she had not infrequently before.  She might get systolics in the 160s but it typically comes down sometimes in the 1 20-1 30 range.  Diastolics seem to be well controlled.  She has no new shortness of breath, PND or orthopnea.  She has no new palpitations, presyncope or syncope.   Past Medical History:  Diagnosis Date   Arthritis    Chicken pox    Depression    Diverticulitis    Elevated urinary free cortisol level 03/25/2014   mild with low potasssium  endo consult   disc with patient     Hypertension    Sciatica    Seasonal allergies    Stroke [facility]) 09/2008   Thyroid disease     Past Surgical History:  Procedure Laterality Date   MINOR BREAST BIOPSY  1979   WISDOM TOOTH EXTRACTION        Current Outpatient Medications  Medication Sig Dispense Refill   Ascorbic Acid (VITAMIN C) 1000 MG tablet Take 1,000 mg by mouth every morning.     ASHWAGANDHA PO Take 1 capsule by mouth daily.  (Patient not taking: Reported on 04/30/2020)     aspirin 81 MG chewable tablet Chew 1 tablet (81 mg total) by mouth daily. 30 tablet 1   B Complex-C (B-COMPLEX WITH VITAMIN C) tablet Take 1 tablet by mouth daily.     Cholecalciferol (VITAMIN D PO) Take 5,000 Units by mouth daily.      cloNIDine (CATAPRES) 0.1 MG tablet Take 1 tablet (0.1 mg total) by mouth 2 (two) times daily. (Patient taking differently: Take 0.5 mg by mouth daily as needed (high blood pressure).) 180 tablet 1   co-enzyme Q-10 30 MG capsule Take 30 mg by mouth daily.     hydrALAZINE (APRESOLINE) 50 MG tablet TAKE 1 TABLET THREE TIMES A DAY 270 tablet 2   Moringa Oleifera (MORINGA PO) Take 1 packet by mouth daily.      MULTIPLE VITAMIN PO Take 1 tablet by mouth daily.      NATTOKINASE PO Take 1 tablet by mouth every evening. Helps blood flow     Nebivolol HCl 20 MG TABS Take 1 tablet (  20 mg total) by mouth daily. 90 tablet 3   NON FORMULARY Chlorella:  Takes 15 capsules daily     NON FORMULARY Super Beet powder that she adds to her smoothie     OVER THE COUNTER MEDICATION Take 1 packet by mouth daily. Mushroom supplement     Probiotic Product (PROBIOTIC PO) Take 1 tablet by mouth daily. Bifido Beadlet 50+     SPIRULINA PO Take 1 teaspoon by mouth daily     No current facility-administered medications for this visit.    Allergies:   Isovue [iopamidol], Erythromycin base, Penicillins, and Sulfa antibiotics    ROS:  Please see the history of present illness.   Otherwise, review of systems are positive for {NONE DEFAULTED:18576}.   All other systems are reviewed and negative.    PHYSICAL EXAM: VS:  There were no vitals taken for this visit. , BMI There is no height or weight on file to calculate BMI. GENERAL:  Well  appearing NECK:  No jugular venous distention, waveform within normal limits, carotid upstroke brisk and symmetric, no bruits, no thyromegaly LUNGS:  Clear to auscultation bilaterally CHEST:  Unremarkable HEART:  PMI not displaced or sustained,S1 and S2 within normal limits, no S3, no S4, no clicks, no rubs, *** murmurs ABD:  Flat, positive bowel sounds normal in frequency in pitch, no bruits, no rebound, no guarding, no midline pulsatile mass, no hepatomegaly, no splenomegaly EXT:  2 plus pulses throughout, no edema, no cyanosis no clubbing     ***GENERAL:  Well appearing HEENT:  Pupils equal round and reactive, fundi not visualized, oral mucosa unremarkable NECK:  No jugular venous distention, waveform within normal limits, carotid upstroke brisk and symmetric, no bruits, no thyromegaly LYMPHATICS:  No cervical, inguinal adenopathy LUNGS:  Clear to auscultation bilaterally BACK:  No CVA tenderness CHEST:  Unremarkable HEART:  PMI not displaced or sustained,S1 and S2 within normal limits, no S3, no S4, no clicks, no rubs, *** murmurs ABD:  Flat, positive bowel sounds normal in frequency in pitch, no bruits, no rebound, no guarding, no midline pulsatile mass, no hepatomegaly, no splenomegaly EXT:  2 plus pulses throughout, no edema, no cyanosis no clubbing SKIN:  No rashes no nodules NEURO:  Cranial nerves II through XII grossly intact, motor grossly intact throughout PSYCH:  Cognitively intact, oriented to person place and time    EKG:  EKG {ACTION; IS/IS AYT:01601093} ordered today. The ekg ordered today demonstrates ***   Recent Labs: 02/20/2020: ALT 15; BUN 12; Creatinine, Ser 1.06; Potassium 4.1; Sodium 138    Lipid Panel    Component Value Date/Time   CHOL 166 02/20/2020 1206   TRIG 70.0 02/20/2020 1206   HDL 64.30 02/20/2020 1206   CHOLHDL 3 02/20/2020 1206   VLDL 14.0 02/20/2020 1206   LDLCALC 88 02/20/2020 1206      Wt Readings from Last 3 Encounters:  02/20/20  178 lb 3.2 oz (80.8 kg)  12/11/19 179 lb (81.2 kg)  10/05/19 183 lb (83 kg)      Other studies Reviewed: Additional studies/ records that were reviewed today include: ***. Review of the above records demonstrates:  Please see elsewhere in the note.  ***   ASSESSMENT AND PLAN:  CVA:   ***  The patient has some vague mild residual symptoms but overall seems to have done well.  No change in therapy.  She is followed by neurology.   HTN:     ***   We had a long discussion  about this.  I think she is on the right medications.  We talked about as needed dosing of her clonidine.  For now she will continue the meds as listed.   LBBB:   ***   This is chronic.  No change in therapy.  She had the echo as above.     Current medicines are reviewed at length with the patient today.  The patient {ACTIONS; HAS/DOES NOT HAVE:19233} concerns regarding medicines.  The following changes have been made:  {PLAN; NO CHANGE:13088:s}  Labs/ tests ordered today include: *** No orders of the defined types were placed in this encounter.    Disposition:   FU with ***    Signed, Rollene Rotunda, MD  12/14/2020 2:36 PM    [facility]

## 2020-12-15 ENCOUNTER — Ambulatory Visit: Payer: Medicare Other | Admitting: Cardiology

## 2020-12-15 DIAGNOSIS — I639 Cerebral infarction, unspecified: Secondary | ICD-10-CM

## 2020-12-15 DIAGNOSIS — I447 Left bundle-branch block, unspecified: Secondary | ICD-10-CM

## 2020-12-15 DIAGNOSIS — I10 Essential (primary) hypertension: Secondary | ICD-10-CM

## 2020-12-17 ENCOUNTER — Encounter: Payer: Self-pay | Admitting: Neurology

## 2021-02-23 ENCOUNTER — Ambulatory Visit: Payer: Medicare Other | Admitting: Neurology

## 2021-02-23 NOTE — Progress Notes (Deleted)
Cardiology Office Note   Date:  02/23/2021   ID:  [name], DOB Aug 28, 1950, MRN [id number]  PCP:  Madelin Headings, MD  Cardiologist:   Rollene Rotunda, MD Referring:  ***  No chief complaint on file.     History of Present Illness: [name] is a 70 y.o. female who presents for with difficult to control hypertension.  She has been intolerant of multiple medications.  She had a rash with spironolactone at 1 point.  Since I last saw her she was in the hospital earlier this year with a small anterolateral left thalamic infarct.  I reviewed these records.  She had some hypertensive urgency.  She did have an echocardiogram with an ejection fraction of 55%.  There were no significant valvular abnormalities.  She had some diastolic dysfunction.  She had negative carotids.  She was sent home for a brief time with aspirin and Plavix but did not tolerate the Plavix.  She has been on aspirin.  Since I last saw her ***  ***  She is taking her blood pressure readings and is taking clonidine 0.1 mg twice a day.  This makes her fatigued but she is taking it.  She is taking the other meds as listed below her blood pressures usually well controlled.  She is not seeing any of the systolics as high as 200 or 190 which she had not infrequently before.  She might get systolics in the 160s but it typically comes down sometimes in the 1 20-1 30 range.  Diastolics seem to be well controlled.  She has no new shortness of breath, PND or orthopnea.  She has no new palpitations, presyncope or syncope.   Past Medical History:  Diagnosis Date   Arthritis    Chicken pox    Depression    Diverticulitis    Elevated urinary free cortisol level 03/25/2014   mild with low potasssium  endo consult   disc with patient     Hypertension    Sciatica    Seasonal allergies    Stroke [facility]) 09/2008   Thyroid disease     Past Surgical History:  Procedure Laterality Date   MINOR BREAST BIOPSY  1979    WISDOM TOOTH EXTRACTION       Current Outpatient Medications  Medication Sig Dispense Refill   Ascorbic Acid (VITAMIN C) 1000 MG tablet Take 1,000 mg by mouth every morning.     ASHWAGANDHA PO Take 1 capsule by mouth daily.  (Patient not taking: Reported on 04/30/2020)     aspirin 81 MG chewable tablet Chew 1 tablet (81 mg total) by mouth daily. 30 tablet 1   B Complex-C (B-COMPLEX WITH VITAMIN C) tablet Take 1 tablet by mouth daily.     Cholecalciferol (VITAMIN D PO) Take 5,000 Units by mouth daily.      cloNIDine (CATAPRES) 0.1 MG tablet Take 1 tablet (0.1 mg total) by mouth 2 (two) times daily. (Patient taking differently: Take 0.5 mg by mouth daily as needed (high blood pressure).) 180 tablet 1   co-enzyme Q-10 30 MG capsule Take 30 mg by mouth daily.     hydrALAZINE (APRESOLINE) 50 MG tablet TAKE 1 TABLET THREE TIMES A DAY 270 tablet 2   Moringa Oleifera (MORINGA PO) Take 1 packet by mouth daily.      MULTIPLE VITAMIN PO Take 1 tablet by mouth daily.      NATTOKINASE PO Take 1 tablet by mouth every evening. Helps blood flow  Nebivolol HCl 20 MG TABS Take 1 tablet (20 mg total) by mouth daily. 90 tablet 3   NON FORMULARY Chlorella:  Takes 15 capsules daily     NON FORMULARY Super Beet powder that she adds to her smoothie     OVER THE COUNTER MEDICATION Take 1 packet by mouth daily. Mushroom supplement     Probiotic Product (PROBIOTIC PO) Take 1 tablet by mouth daily. Bifido Beadlet 50+     SPIRULINA PO Take 1 teaspoon by mouth daily     No current facility-administered medications for this visit.    Allergies:   Isovue [iopamidol], Erythromycin base, Penicillins, and Sulfa antibiotics    ROS:  Please see the history of present illness.   Otherwise, review of systems are positive for {NONE DEFAULTED:18576}.   All other systems are reviewed and negative.    PHYSICAL EXAM: VS:  There were no vitals taken for this visit. , BMI There is no height or weight on file to calculate  BMI. GENERAL:  Well appearing NECK:  No jugular venous distention, waveform within normal limits, carotid upstroke brisk and symmetric, no bruits, no thyromegaly LUNGS:  Clear to auscultation bilaterally CHEST:  Unremarkable HEART:  PMI not displaced or sustained,S1 and S2 within normal limits, no S3, no S4, no clicks, no rubs, *** murmurs ABD:  Flat, positive bowel sounds normal in frequency in pitch, no bruits, no rebound, no guarding, no midline pulsatile mass, no hepatomegaly, no splenomegaly EXT:  2 plus pulses throughout, no edema, no cyanosis no clubbing     ***GENERAL:  Well appearing HEENT:  Pupils equal round and reactive, fundi not visualized, oral mucosa unremarkable NECK:  No jugular venous distention, waveform within normal limits, carotid upstroke brisk and symmetric, no bruits, no thyromegaly LYMPHATICS:  No cervical, inguinal adenopathy LUNGS:  Clear to auscultation bilaterally BACK:  No CVA tenderness CHEST:  Unremarkable HEART:  PMI not displaced or sustained,S1 and S2 within normal limits, no S3, no S4, no clicks, no rubs, *** murmurs ABD:  Flat, positive bowel sounds normal in frequency in pitch, no bruits, no rebound, no guarding, no midline pulsatile mass, no hepatomegaly, no splenomegaly EXT:  2 plus pulses throughout, no edema, no cyanosis no clubbing SKIN:  No rashes no nodules NEURO:  Cranial nerves II through XII grossly intact, motor grossly intact throughout PSYCH:  Cognitively intact, oriented to person place and time    EKG:  EKG {ACTION; IS/IS KZS:[id number]} ordered today. The ekg ordered today demonstrates ***   Recent Labs: No results found for requested labs within last 8760 hours.    Lipid Panel    Component Value Date/Time   CHOL 166 02/20/2020 1206   TRIG 70.0 02/20/2020 1206   HDL 64.30 02/20/2020 1206   CHOLHDL 3 02/20/2020 1206   VLDL 14.0 02/20/2020 1206   LDLCALC 88 02/20/2020 1206      Wt Readings from Last 3 Encounters:   02/20/20 178 lb 3.2 oz (80.8 kg)  12/11/19 179 lb (81.2 kg)  10/05/19 183 lb (83 kg)      Other studies Reviewed: Additional studies/ records that were reviewed today include: ***. Review of the above records demonstrates:  Please see elsewhere in the note.  ***   ASSESSMENT AND PLAN:  CVA: ***  The patient has some vague mild residual symptoms but overall seems to have done well.  No change in therapy.  She is followed by neurology.   HTN: We had a long discussion about this.   ***  I think she is on the right medications.  We talked about as needed dosing of her clonidine.  For now she will continue the meds as listed.   LBBB: This is chronic.  *** No change in therapy.  She had the echo as above.   Current medicines are reviewed at length with the patient today.  The patient {ACTIONS; HAS/DOES NOT HAVE:19233} concerns regarding medicines.  The following changes have been made:  {PLAN; NO CHANGE:13088:s}  Labs/ tests ordered today include: *** No orders of the defined types were placed in this encounter.    Disposition:   FU with ***    Signed, Rollene Rotunda, MD  02/23/2021 9:07 PM    [facility]

## 2021-02-25 ENCOUNTER — Ambulatory Visit: Payer: Medicare Other | Admitting: Cardiology

## 2021-02-25 DIAGNOSIS — I447 Left bundle-branch block, unspecified: Secondary | ICD-10-CM

## 2021-02-25 DIAGNOSIS — I10 Essential (primary) hypertension: Secondary | ICD-10-CM

## 2021-02-26 ENCOUNTER — Ambulatory Visit: Payer: Medicare Other | Admitting: Neurology

## 2021-03-04 ENCOUNTER — Ambulatory Visit: Payer: Medicare Other | Admitting: Neurology

## 2021-03-05 ENCOUNTER — Other Ambulatory Visit: Payer: Self-pay

## 2021-03-05 ENCOUNTER — Telehealth: Payer: Self-pay | Admitting: Cardiology

## 2021-03-05 MED ORDER — BYSTOLIC 20 MG PO TABS: 20.0000 mg | ORAL_TABLET | Freq: Every day | ORAL | 1 refills | Status: DC

## 2021-03-05 MED ORDER — BYSTOLIC 20 MG PO TABS: 20.0000 mg | ORAL_TABLET | Freq: Every day | ORAL | 2 refills | Status: DC

## 2021-03-05 NOTE — Telephone Encounter (Signed)
Patient returned call.  Advised she needed name brand to go to [facility]- I sent in RX there. She also stated they would be faxing over information. I advised any paperwork would go into his box for review.   Patient verbalized understanding.

## 2021-03-05 NOTE — Telephone Encounter (Signed)
Contacted patient, LVM to call back to verify she did want the name brand- not generic.  Did update RX to pharmacy already.   Left call back number to call back

## 2021-03-05 NOTE — Telephone Encounter (Signed)
Pt c/o medication issue:  1. Name of Medication:  Nebivolol HCl 20 MG TABS Take 1 tablet (20 mg total) by mouth daily.   2. How are you currently taking this medication (dosage and times per day)? Take 1 tablet (20 mg total) by mouth daily  3. Are you having a reaction (difficulty breathing--STAT)? no  4. What is your medication issue? Pt states that she is wanting to take the Brand name of this medication not the generic.. please advise

## 2021-04-08 ENCOUNTER — Ambulatory Visit: Payer: Medicare Other | Admitting: Cardiology

## 2021-05-06 ENCOUNTER — Ambulatory Visit: Payer: Medicare Other

## 2021-05-21 NOTE — Progress Notes (Signed)
? ?NEUROLOGY FOLLOW UP OFFICE NOTE ? ?[name] ?[id number] ? ?Assessment/Plan:  ? ?1.  Left thalamocapsular infarct secondary to small vessel disease ?2.  Intracranial stenosis ?3.  Hypertension - elevated today.  May be stress-induced.  However, she is feeling lightheaded ?  ?Secondary stroke prevention as managed by PCP: ?ASA 81mg  daily ?LDL goal less than 70 ?Optimize blood pressure.  As she is symptomatic, recommended going to ED.  She is agreeable ?Hgb A1c goal less than 7 ?Mediterranean diet ?Routine exercise ?Follow up as needed. ? ?Subjective:  ?[name] is a 71 year old right-handed female with HTN and thyroid disease who follows up for stroke. ?. ?UPDATE:  ?Current mediations:  ASA 81mg , clonidiine, hydralazine ?  ?Blood pressure is elevated.  Took her BP medication 3 hours ago.  She is taking the generic but feels name-brand Bystolic worked better.  She is under a lot of stress. Her sister has cancer and will be in hospice.  Her father with dementia is in the hospital for COVID.  Sometimes she notes blurred vision but not frequent.   ?  ?HISTORY:  ?She has residual right hand and foot paresthesias since a stroke in 2010.  However, she had not remained on antiplatelet therapy.  She was admitted to the hospital on 09/03/2019 after presenting with worsening right sided upper and lower extremity paresthesias and heaviness.  Blood pressure was 200s/100s.  CT head showed no acute abnormality but MRI of brain showed small infarct involving the left thalamus and posterior limb of internal capsule.  MRA of head showed intracranial atherosclerosis and stenosis but no large vessel occlusion.  Carotid doppler showed 1-39% bilateral ICA stenosis and antegrade flow of both vertebral arteries.  2D Echo showed EF 50-55% with no cardiac source of emoblus.  LDL was 100 and Hgb A1c was 5.4.  She was discharged on ASA 81mg  and Plavix 75mg  daily for 3 weeks, followed by ASA alone.  She was discharged  on atorvastatin 40mg .  She stopped atorvastatin because it caused itching.  She is still having labile blood pressure.   ?  ?  ?09/04/2019 MRI BRAIN WO:  1. Small acute infarct of the anterolateral left thalamus, adjacent to the posterior limb of the internal capsule. No acute hemorrhage or mass effect.  2. Findings of chronic ischemic microangiopathy. ?09/04/2019 MRA HEAD:  Internal carotid artery patent bilaterally without significant stenosis. Mild atherosclerotic disease on the left. Anterior and middle cerebral arteries patent bilaterally. Severe stenosis inferior division left MCA bifurcation. Moderate stenosis in the superior division of left MCA. Mild irregularity and M3 branches bilaterally. Anterior cerebral arteries patent bilaterally.  Both vertebral arteries patent to the basilar. PICA patent bilaterally. Left AICA patent. Right AICA not visualized. Mild to moderate stenosis distal basilar. Posterior cerebral arteries patent bilaterally with P3 stenosis bilaterally.  Negative for cerebral aneurysm. ? ?PAST MEDICAL HISTORY: ?Past Medical History:  ?Diagnosis Date  ? Arthritis   ? Chicken pox   ? Depression   ? Diverticulitis   ? Elevated urinary free cortisol level 03/25/2014  ? mild with low potasssium  endo consult   disc with patient    ? Hypertension   ? Sciatica   ? Seasonal allergies   ? Stroke [facility]) 09/2008  ? Thyroid disease   ? ? ?MEDICATIONS: ?Current Outpatient Medications on File Prior to Visit  ?Medication Sig Dispense Refill  ? Ascorbic Acid (VITAMIN C) 1000 MG tablet Take 1,000 mg by mouth every morning.    ?  ASHWAGANDHA PO Take 1 capsule by mouth daily.  (Patient not taking: Reported on 04/30/2020)    ? aspirin 81 MG chewable tablet Chew 1 tablet (81 mg total) by mouth daily. 30 tablet 1  ? B Complex-C (B-COMPLEX WITH VITAMIN C) tablet Take 1 tablet by mouth daily.    ? BYSTOLIC 20 MG TABS Take 1 tablet (20 mg total) by mouth daily. 90 tablet 1  ? Cholecalciferol (VITAMIN D PO) Take 5,000  Units by mouth daily.     ? cloNIDine (CATAPRES) 0.1 MG tablet Take 1 tablet (0.1 mg total) by mouth 2 (two) times daily. (Patient taking differently: Take 0.5 mg by mouth daily as needed (high blood pressure).) 180 tablet 1  ? co-enzyme Q-10 30 MG capsule Take 30 mg by mouth daily.    ? hydrALAZINE (APRESOLINE) 50 MG tablet TAKE 1 TABLET THREE TIMES A DAY 270 tablet 2  ? Moringa Oleifera (MORINGA PO) Take 1 packet by mouth daily.     ? MULTIPLE VITAMIN PO Take 1 tablet by mouth daily.     ? NATTOKINASE PO Take 1 tablet by mouth every evening. Helps blood flow    ? NON FORMULARY Chlorella:  Takes 15 capsules daily    ? NON FORMULARY Super Beet powder that she adds to her smoothie    ? OVER THE COUNTER MEDICATION Take 1 packet by mouth daily. Mushroom supplement    ? Probiotic Product (PROBIOTIC PO) Take 1 tablet by mouth daily. Bifido Beadlet 50+    ? SPIRULINA PO Take 1 teaspoon by mouth daily    ? ?No current facility-administered medications on file prior to visit.  ? ? ?ALLERGIES: ?Allergies  ?Allergen Reactions  ? Isovue [Iopamidol] Itching  ?  Pt states she had itching after CT in 2015.  ? Erythromycin Base   ? Penicillins Swelling  ?  Has patient had a PCN reaction causing immediate rash, facial/tongue/throat swelling, SOB or lightheadedness with hypotension: yes ?Has patient had a PCN reaction causing severe rash involving mucus membranes or skin necrosis: no ?Has patient had a PCN reaction that required hospitalization no ?Has patient had a PCN reaction occurring within the last 10 years: no ?If all of the above answers are "NO", then may proceed with Cephalosporin use. ?  ? Sulfa Antibiotics Itching  ? ? ?FAMILY HISTORY: ?Family History  ?Problem Relation Age of Onset  ? Heart failure Mother   ? Heart disease Mother   ? Diabetes Mother   ? Hypertension Mother   ? Hypertension Father   ? Diabetes Maternal Grandmother   ? Hypertension Sister   ? Hypertension Brother   ? Cancer Brother   ?     Prostate  ?  Hypertension Sister   ? ? ?  ?Objective:  ?Blood pressure (!) 199/128, pulse 75, height 5\' 4"  (1.626 m), weight 171 lb 12.8 oz (77.9 kg), SpO2 95 %. ?General: No acute distress.  Patient appears well-groomed.   ?Head:  Normocephalic/atraumatic ?Eyes:  Fundi examined but not visualized ?Neck: supple, no paraspinal tenderness, full range of motion ?Heart:  Regular rate and rhythm ?Lungs:  Clear to auscultation bilaterally ?Back: No paraspinal tenderness ?Neurological Exam: alert and oriented to person, place, and time.  Speech fluent and not dysarthric, language intact.  CN II-XII intact. Bulk and tone normal, muscle strength 5/5 throughout.  Sensation to light touch intact.  Deep tendon reflexes 2+ throughout, toes downgoing.  Finger to nose testing intact.  Gait normal, Romberg negative. ? ? ?  Shon Millet, DO ? ?CC: Berniece Andreas, MD ? ? ? ? ? ? ?

## 2021-05-25 ENCOUNTER — Encounter: Payer: Self-pay | Admitting: Neurology

## 2021-05-25 ENCOUNTER — Ambulatory Visit (INDEPENDENT_AMBULATORY_CARE_PROVIDER_SITE_OTHER): Payer: Medicare Other | Admitting: Neurology

## 2021-05-25 ENCOUNTER — Emergency Department (HOSPITAL_COMMUNITY): Payer: Medicare Other

## 2021-05-25 ENCOUNTER — Emergency Department (HOSPITAL_COMMUNITY)
Admission: EM | Admit: 2021-05-25 | Discharge: 2021-05-25 | Disposition: A | Discharge status: Home / Self Care | Payer: Medicare Other | Attending: Emergency Medicine | Admitting: Emergency Medicine

## 2021-05-25 ENCOUNTER — Other Ambulatory Visit: Payer: Self-pay

## 2021-05-25 ENCOUNTER — Encounter (HOSPITAL_COMMUNITY): Payer: Self-pay | Admitting: Emergency Medicine

## 2021-05-25 VITALS — BP 199/128 | HR 75 | Ht 64.0 in | Wt 171.8 lb

## 2021-05-25 DIAGNOSIS — Z7982 Long term (current) use of aspirin: Secondary | ICD-10-CM | POA: Insufficient documentation

## 2021-05-25 DIAGNOSIS — R519 Headache, unspecified: Secondary | ICD-10-CM | POA: Insufficient documentation

## 2021-05-25 DIAGNOSIS — I10 Essential (primary) hypertension: Secondary | ICD-10-CM | POA: Diagnosis not present

## 2021-05-25 DIAGNOSIS — I639 Cerebral infarction, unspecified: Secondary | ICD-10-CM

## 2021-05-25 DIAGNOSIS — Z79899 Other long term (current) drug therapy: Secondary | ICD-10-CM | POA: Insufficient documentation

## 2021-05-25 DIAGNOSIS — I679 Cerebrovascular disease, unspecified: Secondary | ICD-10-CM

## 2021-05-25 DIAGNOSIS — I6381 Other cerebral infarction due to occlusion or stenosis of small artery: Secondary | ICD-10-CM | POA: Diagnosis not present

## 2021-05-25 LAB — CBC WITH DIFFERENTIAL/PLATELET
Abs Immature Granulocytes: 0.03 10*3/uL (ref 0.00–0.07)
Basophils Absolute: 0.1 10*3/uL (ref 0.0–0.1)
Basophils Relative: 1 %
Eosinophils Absolute: 0.4 10*3/uL (ref 0.0–0.5)
Eosinophils Relative: 6 %
HCT: 43.8 % (ref 36.0–46.0)
Hemoglobin: 14.5 g/dL (ref 12.0–15.0)
Immature Granulocytes: 0 %
Lymphocytes Relative: 28 %
Lymphs Abs: 1.9 10*3/uL (ref 0.7–4.0)
MCH: 31.3 pg (ref 26.0–34.0)
MCHC: 33.1 g/dL (ref 30.0–36.0)
MCV: 94.4 fL (ref 80.0–100.0)
Monocytes Absolute: 0.6 10*3/uL (ref 0.1–1.0)
Monocytes Relative: 8 %
Neutro Abs: 3.9 10*3/uL (ref 1.7–7.7)
Neutrophils Relative %: 57 %
Platelets: 185 10*3/uL (ref 150–400)
RBC: 4.64 MIL/uL (ref 3.87–5.11)
RDW: 13.1 % (ref 11.5–15.5)
WBC: 6.9 10*3/uL (ref 4.0–10.5)
nRBC: 0 % (ref 0.0–0.2)

## 2021-05-25 LAB — BASIC METABOLIC PANEL
Anion gap: 7 (ref 5–15)
BUN: 14 mg/dL (ref 8–23)
CO2: 27 mmol/L (ref 22–32)
Calcium: 9.1 mg/dL (ref 8.9–10.3)
Chloride: 105 mmol/L (ref 98–111)
Creatinine, Ser: 1.12 mg/dL — ABNORMAL HIGH (ref 0.44–1.00)
GFR, Estimated: 53 mL/min — ABNORMAL LOW (ref >60)
Glucose, Bld: 105 mg/dL — ABNORMAL HIGH (ref 70–99)
Potassium: 3.5 mmol/L (ref 3.5–5.1)
Sodium: 139 mmol/L (ref 135–145)

## 2021-05-25 LAB — URINALYSIS, ROUTINE W REFLEX MICROSCOPIC
Bilirubin Urine: NEGATIVE
Glucose, UA: NEGATIVE mg/dL
Hgb urine dipstick: NEGATIVE
Ketones, ur: NEGATIVE mg/dL
Leukocytes,Ua: NEGATIVE
Nitrite: NEGATIVE
Protein, ur: NEGATIVE mg/dL
Specific Gravity, Urine: 1.008 (ref 1.005–1.030)
pH: 7 (ref 5.0–8.0)

## 2021-05-25 NOTE — ED Provider Notes (Signed)
[facility] EMERGENCY DEPARTMENT Provider Note   CSN: [id number] Arrival date & time: 05/25/21  1449     History  Chief Complaint  Patient presents with   Hypertension    [name] is a 71 y.o. female.  71 year old female with prior medical history as detailed below presents for evaluation.  Patient was seen by her neurologist today for routine appointment.  Patient was noted to be hypertensive during clinic visit. Patient without significant symptoms.  Patient reports that she was anxious regarding her clinic appointment.  She also reports that she took her regular antihypertensives late this morning.  She specifically denies headache, visual change, chest pain, shortness of breath, or other complaint.  She has a longstanding history of hypertension.  She denies any recent medication change.  She does report that over the last 3 to 4 days she has had caffeine intake.  She typically avoids all caffeine.   The history is provided by the patient and medical records.  Hypertension This is a chronic problem. The current episode started more than 1 week ago. The problem occurs daily. The problem has not changed since onset.Pertinent negatives include no chest pain, no abdominal pain, no headaches and no shortness of breath.      Home Medications Prior to Admission medications   Medication Sig Start Date End Date Taking? Authorizing Provider  Ascorbic Acid (VITAMIN C) 1000 MG tablet Take 1,000 mg by mouth every morning.    [provider]  ASHWAGANDHA PO Take 1 capsule by mouth daily.  Patient not taking: Reported on 04/30/2020    [provider]  aspirin 81 MG chewable tablet Chew 1 tablet (81 mg total) by mouth daily. 09/07/19   Burnadette Pop, MD  B Complex-C (B-COMPLEX WITH VITAMIN C) tablet Take 1 tablet by mouth daily.    [provider]  BYSTOLIC 20 MG TABS Take 1 tablet (20 mg total) by mouth daily. 03/05/21    Rollene Rotunda, MD  Cholecalciferol (VITAMIN D PO) Take 5,000 Units by mouth daily.     [provider]  cloNIDine (CATAPRES) 0.1 MG tablet Take 1 tablet (0.1 mg total) by mouth 2 (two) times daily. Patient taking differently: Take 0.5 mg by mouth daily as needed (high blood pressure). 06/11/19   Rollene Rotunda, MD  co-enzyme Q-10 30 MG capsule Take 30 mg by mouth daily.    [provider]  hydrALAZINE (APRESOLINE) 50 MG tablet TAKE 1 TABLET THREE TIMES A DAY 06/06/20   Rollene Rotunda, MD  Moringa Oleifera (MORINGA PO) Take 1 packet by mouth daily.     [provider]  MULTIPLE VITAMIN PO Take 1 tablet by mouth daily.     [provider]  NATTOKINASE PO Take 1 tablet by mouth every evening. Helps blood flow    [provider]  NON FORMULARY Chlorella:  Takes 15 capsules daily    [provider]  NON FORMULARY Super Beet powder that she adds to her smoothie    [provider]  OVER THE COUNTER MEDICATION Take 1 packet by mouth daily. Mushroom supplement    [provider]  Probiotic Product (PROBIOTIC PO) Take 1 tablet by mouth daily. Bifido Beadlet 50+    [provider]  SPIRULINA PO Take 1 teaspoon by mouth daily    [provider]      Allergies    Isovue [iopamidol], Erythromycin base, Penicillins, and Sulfa antibiotics    Review of Systems   Review  of Systems  Respiratory:  Negative for shortness of breath.   Cardiovascular:  Negative for chest pain.  Gastrointestinal:  Negative for abdominal pain.  Neurological:  Negative for headaches.  All other systems reviewed and are negative.  Physical Exam Updated Vital Signs BP (!) 172/106   Pulse 61   Temp 99.2 F (37.3 C) (Oral)   Resp 19   Ht 5\' 4"  (1.626 m)   Wt 77.6 kg   SpO2 100%   BMI 29.35 kg/m  Physical Exam Vitals and nursing note reviewed.  Constitutional:      General: She is not in acute distress.    Appearance: Normal  appearance. She is well-developed.  HENT:     Head: Normocephalic and atraumatic.  Eyes:     Conjunctiva/sclera: Conjunctivae normal.     Pupils: Pupils are equal, round, and reactive to light.  Cardiovascular:     Rate and Rhythm: Normal rate and regular rhythm.     Heart sounds: Normal heart sounds.  Pulmonary:     Effort: Pulmonary effort is normal. No respiratory distress.     Breath sounds: Normal breath sounds.  Abdominal:     General: There is no distension.     Palpations: Abdomen is soft.     Tenderness: There is no abdominal tenderness.  Musculoskeletal:        General: No deformity. Normal range of motion.     Cervical back: Normal range of motion and neck supple.  Skin:    General: Skin is warm and dry.  Neurological:     General: No focal deficit present.     Mental Status: She is alert and oriented to person, place, and time. Mental status is at baseline.     Cranial Nerves: No cranial nerve deficit.     Sensory: No sensory deficit.     Motor: No weakness.     Coordination: Coordination normal.    ED Results / Procedures / Treatments   Labs (all labs ordered are listed, but only abnormal results are displayed) Labs Reviewed  BASIC METABOLIC PANEL - Abnormal; Notable for the following components:      Result Value   Glucose, Bld 105 (*)    Creatinine, Ser 1.12 (*)    GFR, Estimated 53 (*)    All other components within normal limits  URINALYSIS, ROUTINE W REFLEX MICROSCOPIC - Abnormal; Notable for the following components:   APPearance HAZY (*)    All other components within normal limits  CBC WITH DIFFERENTIAL/PLATELET    EKG EKG Interpretation  Date/Time:  Monday May 25 2021 16:36:46 EDT Ventricular Rate:  62 PR Interval:  227 QRS Duration: 167 QT Interval:  482 QTC Calculation: 490 R Axis:   -38 Text Interpretation: Sinus rhythm Prolonged PR interval Left bundle branch block Confirmed by Kristine Royal [phone]) on 05/25/2021 4:43:32  PM  Radiology CT Head Wo Contrast  Result Date: 05/25/2021 CLINICAL DATA:  Evaluate for intracranial hemorrhage EXAM: CT HEAD WITHOUT CONTRAST TECHNIQUE: Contiguous axial images were obtained from the base of the skull through the vertex without intravenous contrast. RADIATION DOSE REDUCTION: This exam was performed according to the departmental dose-optimization program which includes automated exposure control, adjustment of the mA and/or kV according to patient size and/or use of iterative reconstruction technique. COMPARISON:  Head CT dated September 03, 2019 FINDINGS: Brain: Chronic white matter ischemic change. Old left basal ganglia lacunar infarcts. No evidence of acute infarction, hemorrhage, hydrocephalus, extra-axial collection or mass lesion/mass effect. Vascular: No  hyperdense vessel or unexpected calcification. Skull: Normal. Negative for fracture or focal lesion. Sinuses/Orbits: No acute finding. Other: None. IMPRESSION: No acute intracranial abnormality. Electronically Signed   By: Allegra Lai M.D.   On: 05/25/2021 18:44    Procedures Procedures    Medications Ordered in ED Medications - No data to display  ED Course/ Medical Decision Making/ A&P                           Medical Decision Making Amount and/or Complexity of Data Reviewed Labs: ordered.    Medical Screen Complete  This patient presented to the ED with complaint of hypertension.  This complaint involves an extensive number of treatment options. The initial differential diagnosis includes, but is not limited to, chronic hypertension, hypertensive urgency, etc.  This presentation is: Acute, Chronic, Self-Limited, Previously Undiagnosed, Uncertain Prognosis, Complicated, Systemic Symptoms, and Threat to Life/Bodily Function  Patient presents from clinic for evaluation of reported elevated BP.  Patient without symptoms  Work-up does not demonstrate evidence of endorgan damage.  Patient is reassured by ED  evaluation and work-up.  Blood pressure improved without intervention here in the ED.  Patient does understand need for close outpatient follow-up.  Strict return precautions given and understood.  Co morbidities that complicated the patient's evaluation  Chronic hypertension   Additional history obtained:  External records from outside sources obtained and reviewed including prior ED visits and prior Inpatient records.    Lab Tests:  I ordered and personally interpreted labs.  The pertinent results include: CBC, BMP, UA   Imaging Studies ordered:  I ordered imaging studies including CT head I independently visualized and interpreted obtained imaging which showed NAD I agree with the radiologist interpretation.   Cardiac Monitoring:  The patient was maintained on a cardiac monitor.  I personally viewed and interpreted the cardiac monitor which showed an underlying rhythm of: NSR   Problem List / ED Course:  Hypertension   Reevaluation:  After the interventions noted above, I reevaluated the patient and found that they have: improved  Disposition:  After consideration of the diagnostic results and the patients response to treatment, I feel that the patent would benefit from close outpatient follow-up.          Final Clinical Impression(s) / ED Diagnoses Final diagnoses:  Hypertension, unspecified type    Rx / DC Orders ED Discharge Orders     None         Wynetta Fines, MD 05/25/21 1929

## 2021-05-25 NOTE — ED Triage Notes (Signed)
Patient sent to ED from neurologist appointment for evaluation of hypertension, patient states she took her bystolic and hydralizine later than normal. Patient states she took her regular medication at 1130 today. Patient denies pain, denies dizziness, reports feeling tired and sleepy after taking her blood pressure medications which is normal for her. ?

## 2021-05-25 NOTE — Discharge Instructions (Addendum)
Return for any problem.  ?

## 2021-05-25 NOTE — ED Provider Triage Note (Signed)
Emergency Medicine Provider Triage Evaluation Note ? ?[name] , a 71 y.o. female  was evaluated in triage.  Pt complains of htn. States she feels tired and she has some mild vision changes which she states Is typical when her pressures are high. Denies HA, chest pain, sob. Took bp meds at 11am ? ?She was at a neuro appt pta and sent here for eval.  ? ?Review of Systems  ?Positive: htn ?Negative: Ha, chest pain, sob ? ?Physical Exam  ?BP (!) 223/118 (BP Location: Right Arm)   Pulse 63   Temp 99.2 ?F (37.3 ?C) (Oral)   Resp 16   SpO2 96%  ?Gen:   Awake, no distress   ?Resp:  Normal effort  ?MSK:   Moves extremities without difficulty  ?Other:  Clear speech,  ? ?Medical Decision Making  ?Medically screening exam initiated at 3:08 PM.  Appropriate orders placed.  Dennisha Mcham was informed that the remainder of the evaluation will be completed by another provider, this initial triage assessment does not replace that evaluation, and the importance of remaining in the ED until their evaluation is complete. ? ? ?  ?Rodney Booze, PA-C ?05/25/21 1512 ? ?

## 2021-05-25 NOTE — Patient Instructions (Addendum)
Continue aspirin 81mg  daily ?Maintain blood pressure and cholesterol control ?Routine exercise ?Mediterranean diet ?Follow up as needed ? ? ?Mediterranean Diet ?A Mediterranean diet refers to food and lifestyle choices that are based on the traditions of countries located on the . It focuses on eating more fruits, vegetables, whole grains, beans, nuts, seeds, and heart-healthy fats, and eating less dairy, meat, eggs, and processed foods with added sugar, salt, and fat. This way of eating has been shown to help prevent certain conditions and improve outcomes for people who have chronic diseases, like kidney disease and heart disease. ?What are tips for following this plan? ?Reading food labels ?Check the serving size of packaged foods. For foods such as rice and pasta, the serving size refers to the amount of cooked product, not dry. ?Check the total fat in packaged foods. Avoid foods that have saturated fat or trans fats. ?Check the ingredient list for added sugars, such as corn syrup. ?Shopping ? ?Buy a variety of foods that offer a balanced diet, including: ?Fresh fruits and vegetables (produce). ?Grains, beans, nuts, and seeds. Some of these may be available in unpackaged forms or large amounts (in bulk). ?Fresh seafood. ?Poultry and eggs. ?Low-fat dairy products. ?Buy whole ingredients instead of prepackaged foods. ?Buy fresh fruits and vegetables in-season from local farmers markets. ?Buy plain frozen fruits and vegetables. ?If you do not have access to quality fresh seafood, buy precooked frozen shrimp or canned fish, such as tuna, salmon, or sardines. ?Stock your pantry so you always have certain foods on hand, such as olive oil, canned tuna, canned tomatoes, rice, pasta, and beans. ?Cooking ?Cook foods with extra-virgin olive oil instead of using butter or other vegetable oils. ?Have meat as a side dish, and have vegetables or grains as your main dish. This means having meat in small  portions or adding small amounts of meat to foods like pasta or stew. ?Use beans or vegetables instead of meat in common dishes like chili or lasagna. ?Experiment with different cooking methods. Try roasting, broiling, steaming, and saut?ing vegetables. ?Add frozen vegetables to soups, stews, pasta, or rice. ?Add nuts or seeds for added healthy fats and plant protein at each meal. You can add these to yogurt, salads, or vegetable dishes. ?Marinate fish or vegetables using olive oil, lemon juice, garlic, and fresh herbs. ?Meal planning ?Plan to eat one vegetarian meal one day each week. Try to work up to two vegetarian meals, if possible. ?Eat seafood two or more times a week. ?Have healthy snacks readily available, such as: ?Vegetable sticks with hummus. ?[employer] yogurt. ?Fruit and nut trail mix. ?Eat balanced meals throughout the week. This includes: ?Fruit: 2-3 servings a day. ?Vegetables: 4-5 servings a day. ?Low-fat dairy: 2 servings a day. ?Fish, poultry, or lean meat: 1 serving a day. ?Beans and legumes: 2 or more servings a week. ?Nuts and seeds: 1-2 servings a day. ?Whole grains: 6-8 servings a day. ?Extra-virgin olive oil: 3-4 servings a day. ?Limit red meat and sweets to only a few servings a month. ?Lifestyle ? ?Cook and eat meals together with your family, when possible. ?Drink enough fluid to keep your urine pale yellow. ?Be physically active every day. This includes: ?Aerobic exercise like running or swimming. ?Leisure activities like gardening, walking, or housework. ?Get 7-8 hours of sleep each night. ?If recommended by your health care provider, drink red wine in moderation. This means 1 glass a day for nonpregnant women and 2 glasses a day for men. A glass  of wine equals 5 oz (150 mL). ?What foods should I eat? ?Fruits ?Apples. Apricots. Avocado. Berries. Bananas. Cherries. Dates. Figs. Grapes. Lemons. Melon. Oranges. Peaches. Plums. Pomegranate. ?Vegetables ?Artichokes. Beets. Broccoli. Cabbage.  Carrots. Eggplant. Green beans. Chard. Kale. Spinach. Onions. Leeks. Peas. Squash. Tomatoes. Peppers. Radishes. ?Grains ?Whole-grain pasta. Brown rice. Bulgur wheat. Polenta. Couscous. Whole-wheat bread. Orpah Cobb. ?Meats and other proteins ?Beans. Almonds. Sunflower seeds. Pine nuts. Peanuts. Cod. Salmon. Scallops. Shrimp. Tuna. Tilapia. Clams. Oysters. Eggs. Poultry without skin. ?Dairy ?Low-fat milk. Cheese. Greek yogurt. ?Fats and oils ?Extra-virgin olive oil. Avocado oil. Grapeseed oil. ?Beverages ?Water. Red wine. Herbal tea. ?Sweets and desserts ?Greek yogurt with honey. Baked apples. Poached pears. Trail mix. ?Seasonings and condiments ?Basil. Cilantro. Coriander. Cumin. Mint. Parsley. Sage. Rosemary. Tarragon. Garlic. Oregano. Thyme. Pepper. Balsamic vinegar. Tahini. Hummus. Tomato sauce. Olives. Mushrooms. ?The items listed above may not be a complete list of foods and beverages you can eat. Contact a dietitian for more information. ?What foods should I limit? ?This is a list of foods that should be eaten rarely or only on special occasions. ?Fruits ?Fruit canned in syrup. ?Vegetables ?Deep-fried potatoes (french fries). ?Grains ?Prepackaged pasta or rice dishes. Prepackaged cereal with added sugar. Prepackaged snacks with added sugar. ?Meats and other proteins ?Beef. Pork. Lamb. Poultry with skin. Hot dogs. Tomasa Blase. ?Dairy ?Ice cream. Sour cream. Whole milk. ?Fats and oils ?Butter. Canola oil. Vegetable oil. Beef fat (tallow). Lard. ?Beverages ?Juice. Sugar-sweetened soft drinks. Beer. Liquor and spirits. ?Sweets and desserts ?Cookies. Cakes. Pies. Candy. ?Seasonings and condiments ?Mayonnaise. Pre-made sauces and marinades. ?The items listed above may not be a complete list of foods and beverages you should limit. Contact a dietitian for more information. ?Summary ?The Mediterranean diet includes both food and lifestyle choices. ?Eat a variety of fresh fruits and vegetables, beans, nuts, seeds, and  whole grains. ?Limit the amount of red meat and sweets that you eat. ?If recommended by your health care provider, drink red wine in moderation. This means 1 glass a day for nonpregnant women and 2 glasses a day for men. A glass of wine equals 5 oz (150 mL). ?This information is not intended to replace advice given to you by your health care provider. Make sure you discuss any questions you have with your health care provider. ?Document Revised: 04/06/2019 Document Reviewed: 02/01/2019 ?Elsevier Patient Education ? 2022 [employer] ? ?

## 2021-05-28 ENCOUNTER — Ambulatory Visit (INDEPENDENT_AMBULATORY_CARE_PROVIDER_SITE_OTHER): Payer: Medicare Other

## 2021-05-28 VITALS — Ht 64.0 in | Wt 171.0 lb

## 2021-05-28 DIAGNOSIS — Z0000 Encounter for general adult medical examination without abnormal findings: Secondary | ICD-10-CM | POA: Diagnosis not present

## 2021-05-28 NOTE — Patient Instructions (Addendum)
?[name] , ?Thank you for taking time to come for your Medicare Wellness Visit. I appreciate your ongoing commitment to your health goals. Please review the following plan we discussed and let me know if I can assist you in the future.  ? ?These are the goals we discussed: ? Goals   ? ?   Patient Stated (pt-stated)   ?   Working towards getting healthier to get off meds and lose weight . ?  ? ?  ?  ?This is a list of the screening recommended for you and due dates:  ?Health Maintenance  ?Topic Date Due  ? Flu Shot  06/12/2021*  ? COVID-19 Vaccine (1) 06/13/2021*  ? Zoster (Shingles) Vaccine (1 of 2) 08/28/2021*  ? Pneumonia Vaccine (1 - PCV) 05/29/2022*  ? Mammogram  05/29/2022*  ? Colon Cancer Screening  05/29/2022*  ? Tetanus Vaccine  05/29/2022*  ? Hepatitis C Screening: USPSTF Recommendation to screen - Ages 18-79 yo.  05/29/2022*  ? DEXA scan (bone density measurement)  Completed  ? HPV Vaccine  Aged Out  ?*Topic was postponed. The date shown is not the original due date.  ? ?Advanced directives: No Patient deferred ? ?Conditions/risks identified: None ? ?Next appointment: Follow up in one year for your annual wellness visit  ? ? ?Preventive Care 2565 Years and Older, Female ?Preventive care refers to lifestyle choices and visits with your health care provider that can promote health and wellness. ?What does preventive care include? ?A yearly physical exam. This is also called an annual well check. ?Dental exams once or twice a year. ?Routine eye exams. Ask your health care provider how often you should have your eyes checked. ?Personal lifestyle choices, including: ?Daily care of your teeth and gums. ?Regular physical activity. ?Eating a healthy diet. ?Avoiding tobacco and drug use. ?Limiting alcohol use. ?Practicing safe sex. ?Taking low-dose aspirin every day. ?Taking vitamin and mineral supplements as recommended by your health care provider. ?What happens during an annual well check? ?The  services and screenings done by your health care provider during your annual well check will depend on your age, overall health, lifestyle risk factors, and family history of disease. ?Counseling  ?Your health care provider may ask you questions about your: ?Alcohol use. ?Tobacco use. ?Drug use. ?Emotional well-being. ?Home and relationship well-being. ?Sexual activity. ?Eating habits. ?History of falls. ?Memory and ability to understand (cognition). ?Work and work Astronomerenvironment. ?Reproductive health. ?Screening  ?You may have the following tests or measurements: ?Height, weight, and BMI. ?Blood pressure. ?Lipid and cholesterol levels. These may be checked every 5 years, or more frequently if you are over 70 years old. ?Skin check. ?Lung cancer screening. You may have this screening every year starting at age 70 if you have a 30-pack-year history of smoking and currently smoke or have quit within the past 15 years. ?Fecal occult blood test (FOBT) of the stool. You may have this test every year starting at age 70. ?Flexible sigmoidoscopy or colonoscopy. You may have a sigmoidoscopy every 5 years or a colonoscopy every 10 years starting at age 70. ?Hepatitis C blood test. ?Hepatitis B blood test. ?Sexually transmitted disease (STD) testing. ?Diabetes screening. This is done by checking your blood sugar (glucose) after you have not eaten for a while (fasting). You may have this done every 1-3 years. ?Bone density scan. This is done to screen for osteoporosis. You may have this done starting at age 70. ?Mammogram. This may be done every 1-2 years. Talk to  your health care provider about how often you should have regular mammograms. ?Talk with your health care provider about your test results, treatment options, and if necessary, the need for more tests. ?Vaccines  ?Your health care provider may recommend certain vaccines, such as: ?Influenza vaccine. This is recommended every year. ?Tetanus, diphtheria, and acellular  pertussis (Tdap, Td) vaccine. You may need a Td booster every 10 years. ?Zoster vaccine. You may need this after age 48. ?Pneumococcal 13-valent conjugate (PCV13) vaccine. One dose is recommended after age 37. ?Pneumococcal polysaccharide (PPSV23) vaccine. One dose is recommended after age 12. ?Talk to your health care provider about which screenings and vaccines you need and how often you need them. ?This information is not intended to replace advice given to you by your health care provider. Make sure you discuss any questions you have with your health care provider. ?Document Released: 03/28/2015 Document Revised: 11/19/2015 Document Reviewed: 12/31/2014 ?Elsevier Interactive Patient Education ? 2017 [employer] ? ?Fall Prevention in the Home ?Falls can cause injuries. They can happen to people of all ages. There are many things you can do to make your home safe and to help prevent falls. ?What can I do on the outside of my home? ?Regularly fix the edges of walkways and driveways and fix any cracks. ?Remove anything that might make you trip as you walk through a door, such as a raised step or threshold. ?Trim any bushes or trees on the path to your home. ?Use bright outdoor lighting. ?Clear any walking paths of anything that might make someone trip, such as rocks or tools. ?Regularly check to see if handrails are loose or broken. Make sure that both sides of any steps have handrails. ?Any raised decks and porches should have guardrails on the edges. ?Have any leaves, snow, or ice cleared regularly. ?Use sand or salt on walking paths during winter. ?Clean up any spills in your garage right away. This includes oil or grease spills. ?What can I do in the bathroom? ?Use night lights. ?Install grab bars by the toilet and in the tub and shower. Do not use towel bars as grab bars. ?Use non-skid mats or decals in the tub or shower. ?If you need to sit down in the shower, use a plastic, non-slip stool. ?Keep the floor  dry. Clean up any water that spills on the floor as soon as it happens. ?Remove soap buildup in the tub or shower regularly. ?Attach bath mats securely with double-sided non-slip rug tape. ?Do not have throw rugs and other things on the floor that can make you trip. ?What can I do in the bedroom? ?Use night lights. ?Make sure that you have a light by your bed that is easy to reach. ?Do not use any sheets or blankets that are too big for your bed. They should not hang down onto the floor. ?Have a firm chair that has side arms. You can use this for support while you get dressed. ?Do not have throw rugs and other things on the floor that can make you trip. ?What can I do in the kitchen? ?Clean up any spills right away. ?Avoid walking on wet floors. ?Keep items that you use a lot in easy-to-reach places. ?If you need to reach something above you, use a strong step stool that has a grab bar. ?Keep electrical cords out of the way. ?Do not use floor polish or wax that makes floors slippery. If you must use wax, use non-skid floor wax. ?Do not  have throw rugs and other things on the floor that can make you trip. ?What can I do with my stairs? ?Do not leave any items on the stairs. ?Make sure that there are handrails on both sides of the stairs and use them. Fix handrails that are broken or loose. Make sure that handrails are as long as the stairways. ?Check any carpeting to make sure that it is firmly attached to the stairs. Fix any carpet that is loose or worn. ?Avoid having throw rugs at the top or bottom of the stairs. If you do have throw rugs, attach them to the floor with carpet tape. ?Make sure that you have a light switch at the top of the stairs and the bottom of the stairs. If you do not have them, ask someone to add them for you. ?What else can I do to help prevent falls? ?Wear shoes that: ?Do not have high heels. ?Have rubber bottoms. ?Are comfortable and fit you well. ?Are closed at the toe. Do not wear  sandals. ?If you use a stepladder: ?Make sure that it is fully opened. Do not climb a closed stepladder. ?Make sure that both sides of the stepladder are locked into place. ?Ask someone to hold it for you, if possible

## 2021-05-28 NOTE — Progress Notes (Addendum)
? ?Subjective:  ? [name] is a 71 y.o. female who presents for Medicare Annual (Subsequent) preventive examination. ? ?Review of Systems    ?Virtual Visit via Telephone Note ? ?I connected with  [name] on 05/28/21 at 11:15 AM EDT by telephone and verified that I am speaking with the correct person using two identifiers. ? ?Location: ?Patient: Home ?Provider: Office ?Persons participating in the virtual visit: patient/Nurse Health Advisor ?  ?I discussed the limitations, risks, security and privacy concerns of performing an evaluation and management service by telephone and the availability of in person appointments. The patient expressed understanding and agreed to proceed. ? ?Interactive audio and video telecommunications were attempted between this nurse and patient, however failed, due to patient having technical difficulties OR patient did not have access to video capability.  We continued and completed visit with audio only. ? ?Some vital signs may be absent or patient reported.  ? ?Krystal Rung, LPN  ?Cardiac Risk Factors include: advanced age (>2men, >29 women);hypertension ? ?   ?Objective:  ?  ?Today's Vitals  ? 05/28/21 1120 05/28/21 1122  ?SpO2: 98%   ?Weight: 171 lb (77.6 kg)   ?Height: 5\' 4"  (1.626 m)   ?PainSc:  8   ? ?Body mass index is 29.35 kg/m?. ? ?Advanced Directives 05/28/2021 05/25/2021 04/30/2020 02/20/2020 10/05/2019 09/04/2019 09/03/2019  ?Does Patient Have a Medical Advance Directive? No No No No No No No  ?Would patient like information on creating a medical advance directive? No - Patient declined No - Patient declined - - - No - Patient declined No - Patient declined  ?Pre-existing out of facility DNR order (yellow form or pink MOST form) - - - - - - -  ? ? ?Current Medications (verified) ?Outpatient Encounter Medications as of 05/28/2021  ?Medication Sig  ? Ascorbic Acid (VITAMIN C) 1000 MG tablet Take 1,000 mg by mouth every morning.  ? ASHWAGANDHA PO Take 1  capsule by mouth daily.  (Patient not taking: Reported on 04/30/2020)  ? aspirin 81 MG chewable tablet Chew 1 tablet (81 mg total) by mouth daily.  ? B Complex-C (B-COMPLEX WITH VITAMIN C) tablet Take 1 tablet by mouth daily.  ? BYSTOLIC 20 MG TABS Take 1 tablet (20 mg total) by mouth daily.  ? Cholecalciferol (VITAMIN D PO) Take 5,000 Units by mouth daily.   ? cloNIDine (CATAPRES) 0.1 MG tablet Take 1 tablet (0.1 mg total) by mouth 2 (two) times daily. (Patient taking differently: Take 0.5 mg by mouth daily as needed (high blood pressure).)  ? co-enzyme Q-10 30 MG capsule Take 30 mg by mouth daily.  ? hydrALAZINE (APRESOLINE) 50 MG tablet TAKE 1 TABLET THREE TIMES A DAY  ? Moringa Oleifera (MORINGA PO) Take 1 packet by mouth daily.   ? MULTIPLE VITAMIN PO Take 1 tablet by mouth daily.   ? NATTOKINASE PO Take 1 tablet by mouth every evening. Helps blood flow  ? NON FORMULARY Chlorella:  Takes 15 capsules daily  ? NON FORMULARY Super Beet powder that she adds to her smoothie  ? OVER THE COUNTER MEDICATION Take 1 packet by mouth daily. Mushroom supplement  ? Probiotic Product (PROBIOTIC PO) Take 1 tablet by mouth daily. Bifido Beadlet 50+  ? SPIRULINA PO Take 1 teaspoon by mouth daily  ? ?No facility-administered encounter medications on file as of 05/28/2021.  ? ? ?Allergies (verified) ?Isovue [iopamidol], Erythromycin base, Penicillins, and Sulfa antibiotics  ? ?History: ?Past Medical History:  ?Diagnosis Date  ?  Arthritis   ? Chicken pox   ? Depression   ? Diverticulitis   ? Elevated urinary free cortisol level 03/25/2014  ? mild with low potasssium  endo consult   disc with patient    ? Hypertension   ? Sciatica   ? Seasonal allergies   ? Stroke [facility]) 09/2008  ? Thyroid disease   ? ?Past Surgical History:  ?Procedure Laterality Date  ? MINOR BREAST BIOPSY  1979  ? WISDOM TOOTH EXTRACTION    ? ?Family History  ?Problem Relation Age of Onset  ? Heart failure Mother   ? Heart disease Mother   ? Diabetes Mother   ?  Hypertension Mother   ? Hypertension Father   ? Diabetes Maternal Grandmother   ? Hypertension Sister   ? Hypertension Brother   ? Cancer Brother   ?     Prostate  ? Hypertension Sister   ? ?Social History  ? ?Socioeconomic History  ? Marital status: Widowed  ?  Spouse name: Not on file  ? Number of children: Not on file  ? Years of education: Not on file  ? Highest education level: Not on file  ?Occupational History  ? Not on file  ?Tobacco Use  ? Smoking status: Never  ? Smokeless tobacco: Never  ?Vaping Use  ? Vaping Use: Not on file  ?Substance and Sexual Activity  ? Alcohol use: Yes  ?  Comment: Social Use only  ? Drug use: No  ? Sexual activity: Not Currently  ?Other Topics Concern  ? Not on file  ?Social History Narrative  ? Usually sleeps 7 hours per night  ? Lives with her son 4230 y no pets   ? No pets  ? BA degree in various grad work  ? Note about 10 years husband was in the [employer]  ? 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 g2p2  ? Neg tad herbals exercise   ?   ? Right handed  ? ?Social Determinants of Health  ? ?Financial Resource Strain: Low Risk   ? Difficulty of Paying Living Expenses: Not hard at all  ?Food Insecurity: No Food Insecurity  ? Worried About Programme researcher, broadcasting/film/videounning Out of Food in the Last Year: Never true  ? Ran Out of Food in the Last Year: Never true  ?Transportation Needs: No Transportation Needs  ? Lack of Transportation (Medical): No  ? Lack of Transportation (Non-Medical): No  ?Physical Activity: Insufficiently Active  ? Days of Exercise per Week: 2 days  ? Minutes of Exercise per Session: 30 min  ?Stress: Stress Concern Present  ? Feeling of Stress : To some extent  ?Social Connections: Moderately Integrated  ? Frequency of Communication with Friends and Family: More than three times a week  ? Frequency of Social Gatherings with Friends and Family: More than three times a week  ? Attends Religious Services: More than 4  times per year  ? Active Member of Clubs or Organizations: Yes  ? Attends BankerClub or Organization Meetings: More than 4 times per year  ? Marital Status: Widowed  ? ? ? ?Clinical Intake: ? ?Pre-visit preparation completed: No ? ?Diabetic?  No ? ? ?Activities of Daily Living ?In your present state of health, do you have any difficulty performing the following activities: 05/28/2021  ?Hearing? N  ?Vision? N  ?Difficulty concentrating or making decisions? N  ?Walking or climbing stairs? N  ?Dressing or  bathing? N  ?Doing errands, shopping? N  ?Preparing Food and eating ? N  ?Using the Toilet? N  ?In the past six months, have you accidently leaked urine? N  ?Do you have problems with loss of bowel control? N  ?Managing your Medications? N  ?Managing your Finances? N  ?Housekeeping or managing your Housekeeping? N  ?Some recent data might be hidden  ? ? ?Patient Care Team: ?Panosh, Neta Mends, MD as PCP - General (Internal Medicine) ?Rollene Rotunda, MD as PCP - Cardiology (Cardiology) ?Jake Bathe, MD as Consulting Physician (Cardiology) ?Camille Bal, MD as Consulting Physician (Nephrology) ? ?Indicate any recent Medical Services you may have received from other than [facility] providers in the past year (date may be approximate). ? ?   ?Assessment:  ? This is a routine wellness examination for [name]. ? ?Hearing/Vision screen ?Hearing Screening - Comments:: No difficulty hearing ?Vision Screening - Comments:: Wears reading glasses. Followed by [facility] ? ?Dietary issues and exercise activities discussed: ?Exercise limited by: None identified ? ? Goals Addressed   ? ?  ?  ?  ?  ?  ? This Visit's Progress  ?   Patient Stated (pt-stated)     ?   Working towards getting healthier to get off meds and lose weight . ?  ? ?  ? ?Depression Screen ?PHQ 2/9 Scores 05/28/2021 04/30/2020 06/27/2015 01/21/2015 01/18/2015 12/29/2014 06/19/2013  ?PHQ - 2 Score 1 1 0 0 0 0 0  ?  ?Fall Risk ?Fall Risk  05/28/2021 04/30/2020 02/20/2020 10/05/2019  11/17/2016  ?Falls in the past year? 1 1 0 0 Yes  ?Number falls in past yr: 0 1 0 0 1  ?Injury with Fall? 1 1 0 0 No  ?Comment Bruised shoulder followed by medical attention - - - -  ?Risk for fall due to : Other

## 2021-06-04 ENCOUNTER — Telehealth: Payer: Self-pay

## 2021-06-04 NOTE — Telephone Encounter (Signed)
Left voicemail for patient to call the office 

## 2021-06-07 NOTE — Progress Notes (Signed)
?  ? ?Cardiology Office Note ? ? ?Date:  06/08/2021  ? ?ID:  [name], DOB [birth year], MRN [id number] ? ?PCP:  Madelin Headings, MD  ?Cardiologist:   Rollene Rotunda, MD ? ? ?Chief Complaint  ?Patient presents with  ? Hypertension  ? ? ?  ?History of Present Illness: ?[name] is a 71 y.o. female who presents follow up of difficult to control hypertension.  She has been intolerant of multiple medications.   She was in the ED earlier this month with hypertensive urgency.    I reviewed these records for this visit.     ? ?She sometimes runs out of medicines or maybe forgot the medicine.  She has a lot of emotional stress.  She thinks all of this was driving up her blood pressure.  When she takes her medicines she says it is in the 140/80 range although when she first wakes up in the morning it is more like 170/90.  She says it comes down after she takes the first dose.  She has not seen the 200s systolics that she was having.  She says she really only responds well to brand name diastolic and so when she went to the emergency room she was trying to conserve on this because she could not get this.  They were wanting to give her the generic.  She was taking only half of the prescribed dose of Bystolic. ? ?Again she has had a lot of stress.  She has not been doing as much walking as she used to do prepandemic.  She is not describing any new chest pressure, neck or arm discomfort.  She is not describing any new shortness of breath, PND or orthopnea.  Has had no weight gain or edema. ? ? ?Past Medical History:  ?Diagnosis Date  ? Arthritis   ? Chicken pox   ? Depression   ? Diverticulitis   ? Elevated urinary free cortisol level 03/25/2014  ? mild with low potasssium  endo consult   disc with patient    ? Hypertension   ? Sciatica   ? Seasonal allergies   ? Stroke [facility] 09/2008  ? Thyroid disease   ? ? ?Past Surgical History:  ?Procedure Laterality Date  ? MINOR BREAST BIOPSY  1979  ? WISDOM TOOTH  EXTRACTION    ? ? ? ?Current Outpatient Medications  ?Medication Sig Dispense Refill  ? Ascorbic Acid (VITAMIN C) 1000 MG tablet Take 1,000 mg by mouth every morning.    ? ASHWAGANDHA PO Take 1 capsule by mouth daily.    ? aspirin 81 MG chewable tablet Chew 1 tablet (81 mg total) by mouth daily. 30 tablet 1  ? B Complex-C (B-COMPLEX WITH VITAMIN C) tablet Take 1 tablet by mouth daily.    ? Cholecalciferol (VITAMIN D PO) Take 5,000 Units by mouth daily.     ? cloNIDine (CATAPRES) 0.1 MG tablet Take 1 tablet (0.1 mg total) by mouth 2 (two) times daily. (Patient taking differently: Take 0.5 mg by mouth daily as needed (high blood pressure).) 180 tablet 1  ? co-enzyme Q-10 30 MG capsule Take 30 mg by mouth daily.    ? hydrALAZINE (APRESOLINE) 50 MG tablet TAKE 1 TABLET THREE TIMES A DAY 270 tablet 2  ? Moringa Oleifera (MORINGA PO) Take 1 packet by mouth daily.     ? MULTIPLE VITAMIN PO Take 1 tablet by mouth daily.     ? NATTOKINASE PO Take 1 tablet by mouth every  evening. Helps blood flow    ? NON FORMULARY Chlorella:  Takes 15 capsules daily    ? NON FORMULARY Super Beet powder that she adds to her smoothie    ? OVER THE COUNTER MEDICATION Take 1 packet by mouth daily. Mushroom supplement    ? Probiotic Product (PROBIOTIC PO) Take 1 tablet by mouth daily. Bifido Beadlet 50+    ? SPIRULINA PO Take 1 teaspoon by mouth daily    ? BYSTOLIC 20 MG TABS Take 1 tablet (20 mg total) by mouth daily. 90 tablet 3  ? ?No current facility-administered medications for this visit.  ? ? ?Allergies:   Isovue [iopamidol], Erythromycin base, Penicillins, and Sulfa antibiotics  ? ? ?ROS:  Please see the history of present illness.   Otherwise, review of systems are positive for none.   All other systems are reviewed and negative.  ? ? ?PHYSICAL EXAM: ?VS:  BP (!) 166/100 (BP Location: Left Arm, Patient Position: Sitting, Cuff Size: Large)   Pulse 66   Ht 5\' 3"  (1.6 m)   Wt 170 lb (77.1 kg)   BMI 30.11 kg/m?  , BMI Body mass index is  30.11 kg/m?. ?GENERAL:  Well appearing ?NECK:  No jugular venous distention, waveform within normal limits, carotid upstroke brisk and symmetric, no bruits, no thyromegaly ?LUNGS:  Clear to auscultation bilaterally ?CHEST:  Unremarkable ?HEART:  PMI not displaced or sustained,S1 and S2 within normal limits, no S3, no S4, no clicks, no rubs, no murmurs ?ABD:  Flat, positive bowel sounds normal in frequency in pitch, no bruits, no rebound, no guarding, no midline pulsatile mass, no hepatomegaly, no splenomegaly ?EXT:  2 plus pulses throughout, no edema, no cyanosis no clubbing ? ? ? ?EKG:  EKG  ordered today. ?The ekg ordered today demonstrates sinus rhythm, rate 66, left bundle branch block, no acute ST-T wave changes. ? ? ?Recent Labs: ?05/25/2021: BUN 14; Creatinine, Ser 1.12; Hemoglobin 14.5; Platelets 185; Potassium 3.5; Sodium 139  ? ? ?Lipid Panel ?   ?Component Value Date/Time  ? CHOL 166 02/20/2020 1206  ? TRIG 70.0 02/20/2020 1206  ? HDL 64.30 02/20/2020 1206  ? CHOLHDL 3 02/20/2020 1206  ? VLDL 14.0 02/20/2020 1206  ? LDLCALC 88 02/20/2020 1206  ? ?  ? ?Wt Readings from Last 3 Encounters:  ?06/08/21 170 lb (77.1 kg)  ?05/28/21 171 lb (77.6 kg)  ?05/25/21 171 lb (77.6 kg)  ?  ? ? ?Other studies Reviewed: ?Additional studies/ records that were reviewed today include: ED records  . ?Review of the above records demonstrates:  Please see elsewhere in the note.   ? ? ?ASSESSMENT AND PLAN: ? ?CVA:   She had some vague residual symptoms from this. No change in therapy. ? ?HTN:    We had another long discussion about this.  At this point as she has been sensitive to medications and not really consistently compliant I would like to leave her on that she is on, get her to increase her physical activity and keep a blood pressure diary with consistent medications.  If she is maintaining consistently above the 140/80 range we would have to think about changing her medicines further.  Again she has been sensitive.  ?   ?LBBB: This is chronic and we again reviewed this.  ? ?ASCENDING AORTIC DILATATION:   ? ? ?Current medicines are reviewed at length with the patient today.  The patient does not have concerns regarding medicines. ? ?The following changes have been made:  no change ? ?Labs/ tests ordered today include:  ? ?Orders Placed This Encounter  ?Procedures  ? EKG 12-Lead  ? ? ? ?Disposition:   FU with me in 3 months.  ? ? ?Signed, ?Rollene RotundaJames Briell Paulette, MD  ?06/08/2021 11:11 AM    ?[facility] ? ? ? ?

## 2021-06-08 ENCOUNTER — Ambulatory Visit (INDEPENDENT_AMBULATORY_CARE_PROVIDER_SITE_OTHER): Payer: Medicare Other | Admitting: Cardiology

## 2021-06-08 ENCOUNTER — Encounter: Payer: Self-pay | Admitting: Cardiology

## 2021-06-08 ENCOUNTER — Other Ambulatory Visit: Payer: Self-pay

## 2021-06-08 VITALS — BP 166/100 | HR 66 | Ht 63.0 in | Wt 170.0 lb

## 2021-06-08 DIAGNOSIS — I2510 Atherosclerotic heart disease of native coronary artery without angina pectoris: Secondary | ICD-10-CM | POA: Diagnosis not present

## 2021-06-08 DIAGNOSIS — I160 Hypertensive urgency: Secondary | ICD-10-CM | POA: Diagnosis not present

## 2021-06-08 MED ORDER — BYSTOLIC 20 MG PO TABS: 20.0000 mg | ORAL_TABLET | Freq: Every day | ORAL | 3 refills | Status: DC

## 2021-06-08 NOTE — Patient Instructions (Signed)
Medication Instructions:  ?Your physician recommends that you continue on your current medications as directed. Please refer to the Current Medication list given to you today. ? ?*If you need a refill on your cardiac medications before your next appointment, please call your pharmacy* ? ? ?Follow-Up: ?At [facility], you and your health needs are our priority.  As part of our continuing mission to provide you with exceptional heart care, we have created designated Provider Care Teams.  These Care Teams include your primary Cardiologist (physician) and Advanced Practice Providers (APPs -  Physician Assistants and Nurse Practitioners) who all work together to provide you with the care you need, when you need it. ? ?We recommend signing up for the patient portal called "MyChart".  Sign up information is provided on this After Visit Summary.  MyChart is used to connect with patients for Virtual Visits (Telemedicine).  Patients are able to view lab/test results, encounter notes, upcoming appointments, etc.  Non-urgent messages can be sent to your provider as well.   ?To learn more about what you can do with MyChart, go to [email or web address].   ? ?Your next appointment:   ?3 month(s) ? ?The format for your next appointment:   ?In Person ? ?Provider:   ?James Hochrein, MD  ?

## 2021-06-16 ENCOUNTER — Other Ambulatory Visit: Payer: Self-pay

## 2021-06-16 MED ORDER — HYDRALAZINE HCL 50 MG PO TABS: 50.0000 mg | ORAL_TABLET | Freq: Three times a day (TID) | ORAL | 3 refills | Status: DC

## 2021-06-29 ENCOUNTER — Telehealth: Payer: Self-pay | Admitting: Cardiology

## 2021-06-29 MED ORDER — BYSTOLIC 20 MG PO TABS: 20.0000 mg | ORAL_TABLET | Freq: Every day | ORAL | 0 refills | Status: DC

## 2021-06-29 NOTE — Telephone Encounter (Signed)
Spoke to patient she stated she has not received Bystolic from mail order pharmacy.Bystolic refill sent to [facility]. ?

## 2021-06-29 NOTE — Telephone Encounter (Signed)
Pt c/o medication issue: ? ?1. Name of Medication: BYSTOLIC 20 MG TABS ? ?2. How are you currently taking this medication (dosage and times per day)? 1 tablet daily ? ?3. Are you having a reaction (difficulty breathing--STAT)? no ? ?4. What is your medication issue? Patient states the pharmacy faxed a some kind of approval for the medication and she would like to know if it was received. She states she also needs to know if there is a way she can get a short supply of the medication, because she is completely out. She says she has gotten it before at [facility], but it is very expensive.   ?

## 2021-06-29 NOTE — Telephone Encounter (Signed)
Patient calling the office for samples of medication: ? ? ?1.  What medication and dosage are you requesting samples for? ?BYSTOLIC 20 MG TABS ? ?2.  Are you currently out of this medication? Yes ?  ?Please leave detailed message if she does not answer.  ?

## 2021-06-29 NOTE — Telephone Encounter (Signed)
Left a message that we do not have samples of Bystolic any longer.  ?

## 2021-07-07 ENCOUNTER — Telehealth: Payer: Self-pay

## 2021-07-07 NOTE — Telephone Encounter (Signed)
**Note De-Identified Birtha Hatler Obfuscation** We received another name brand Bystolic PA. ?Please see following phone note: ? ?November 26, 2020 ?Me ?  ?12:43 PM ?Note ?I called the pt to make her aware that her ins plan will not cover name brand Bystolic but got no answer so I left a message on her VM asking her to call Larita Fife back at Dr [employer] office at [facility] at [phone]. ?  ?I then called [facility] pharmacy and advised Amber that the pts insurance is denying coverage of name brand Bystolic. ?Per [facility] the pt has been getting generic Nebivolol since it became available  in 2016 and is unsure why she is requesting Bystolic. ?  ?Amber states that they will contact the pt to let her know that her Nebivolol refill is ready for pick up and will advise her that her ins will not cover Bystolic. ?Amber also states that she will instruct the pt to call me if she has any questions. ?  ?Amber thanked me for calling them with update. ?   ?  ?I did call to discuss why she cannot take formulary Nebivolol as she has been but got no answer so I left a message asking her to call Larita Fife at Dr [facility] office at [phone]. ?

## 2021-07-09 NOTE — Telephone Encounter (Signed)
Patient returned call. She can be reached at [phone]. ?

## 2021-07-10 NOTE — Telephone Encounter (Signed)
**Note De-Identified Luccia Reinheimer Obfuscation** No answer so I left a message asking the pt to call Jeani Hawking at Dr [facility] office at [phone]. ?

## 2021-08-03 ENCOUNTER — Telehealth: Payer: Self-pay | Admitting: Cardiology

## 2021-08-03 ENCOUNTER — Encounter: Payer: Self-pay | Admitting: *Deleted

## 2021-08-03 MED ORDER — BYSTOLIC 20 MG PO TABS: 20.0000 mg | ORAL_TABLET | Freq: Every day | ORAL | 2 refills | Status: DC

## 2021-08-03 NOTE — Telephone Encounter (Signed)
*  STAT* If patient is at the pharmacy, call can be transferred to refill team.   1. Which medications need to be refilled? (please list name of each medication and dose if known) BYSTOLIC 20 MG TABS  2. Which pharmacy/location (including street and city if local pharmacy) is medication to be sent to? [facility] - [place], [place] - [address]  3. Do they need a 30 day or 90 day supply? 90 day supply   Patient only has 1 tablet left.

## 2021-08-03 NOTE — Telephone Encounter (Signed)
Left message (ok per DPR)-advised no samples.   Will refill as requested to mail order.  If would like or need short supply sent to local pharmacy advised to let us know.

## 2021-08-03 NOTE — Telephone Encounter (Signed)
Patient calling the office for samples of medication: BYSTOLIC 20 MG TABS  2.  Are you currently out of this medication? Only has one tablet left

## 2021-08-04 NOTE — Telephone Encounter (Signed)
Called patient to advise Refill sent to pharmacy. Left message.

## 2021-08-05 ENCOUNTER — Other Ambulatory Visit: Payer: Self-pay

## 2021-08-06 ENCOUNTER — Encounter: Payer: Self-pay | Admitting: *Deleted

## 2021-08-07 ENCOUNTER — Other Ambulatory Visit: Payer: Self-pay

## 2021-08-07 MED ORDER — BYSTOLIC 20 MG PO TABS: 20.0000 mg | ORAL_TABLET | Freq: Every day | ORAL | 2 refills | Status: DC

## 2021-08-11 ENCOUNTER — Telehealth: Payer: Self-pay | Admitting: *Deleted

## 2021-08-11 NOTE — Telephone Encounter (Signed)
[facility] with questions to get brand bystolic approved. Left message for pt to call to discuss why she is unable to take generic.

## 2021-08-12 ENCOUNTER — Other Ambulatory Visit: Payer: Self-pay

## 2021-08-14 ENCOUNTER — Other Ambulatory Visit: Payer: Self-pay | Admitting: *Deleted

## 2021-08-18 MED ORDER — BYSTOLIC 20 MG PO TABS: 20.0000 mg | ORAL_TABLET | Freq: Every day | ORAL | 6 refills | Status: DC

## 2021-08-18 NOTE — Telephone Encounter (Signed)
Spoke with pt, her follow up appointment is in July, refill sent to the local pharmacy at patient request. She will discuss with dr hochrein at time of follow up.

## 2021-08-18 NOTE — Telephone Encounter (Signed)
Spoke with pt, she reports she was not able to swallow the generic and she felt bad. I called and spoke with [facility], they will not approve brand only for the previous reasons. Left message for the patient that the medication is denied. Question if she wants to try the generic again or change to a different medication.

## 2021-09-21 ENCOUNTER — Ambulatory Visit: Payer: Medicare Other | Admitting: Cardiology

## 2021-11-04 ENCOUNTER — Ambulatory Visit: Payer: Medicare Other | Admitting: Cardiology

## 2021-11-16 DIAGNOSIS — I7121 Aneurysm of the ascending aorta, without rupture: Secondary | ICD-10-CM | POA: Insufficient documentation

## 2021-11-16 NOTE — Progress Notes (Unsigned)
Cardiology Office Note   Date:  11/18/2021   ID:  [name], DOB 04/14/1950, MRN [id number]  PCP:  Madelin Headings, MD  Cardiologist:   Rollene Rotunda, MD   Chief Complaint  Patient presents with   Hand Numbness      History of Present Illness: [name] is a 71 y.o. female who presents follow up of difficult to control hypertension.  She has been intolerant of multiple medications.   She was in the ED in March with hypertensive urgency.  She was taking by systolic brand name with good control but has not been able to get this.  She has been taking her hydralazine twice daily.  She takes her clonidine a half a pill when needed.  She said her blood pressures are still running in the 140s over 80s consistently.  She has not had any more hypertensive urgency.  She has some hand numbness and some facial numbness residual from her stroke.  However, she says she is otherwise doing okay.  She is not walking as much as she was.  She denies any chest pressure, neck or arm discomfort.  She has had no new palpitations, presyncope or syncope.  She has had no new shortness of breath, PND or orthopnea.   Past Medical History:  Diagnosis Date   Arthritis    Chicken pox    Depression    Diverticulitis    Elevated urinary free cortisol level 03/25/2014   mild with low potasssium  endo consult   disc with patient     Hypertension    Sciatica    Seasonal allergies    Stroke [facility]) 09/2008   Thyroid disease     Past Surgical History:  Procedure Laterality Date   MINOR BREAST BIOPSY  1979   WISDOM TOOTH EXTRACTION       Current Outpatient Medications  Medication Sig Dispense Refill   Ascorbic Acid (VITAMIN C) 1000 MG tablet Take 1,000 mg by mouth every morning.     ASHWAGANDHA PO Take 1 capsule by mouth daily.     B Complex-C (B-COMPLEX WITH VITAMIN C) tablet Take 1 tablet by mouth daily.     BYSTOLIC 20 MG TABS Take 20 mg daily 90 tablet 3   Cholecalciferol  (VITAMIN D PO) Take 5,000 Units by mouth daily.      cloNIDine (CATAPRES) 0.1 MG tablet Take 1/2 tablet daily as needed 60 tablet 11   co-enzyme Q-10 30 MG capsule Take 30 mg by mouth daily.     hydrALAZINE (APRESOLINE) 50 MG tablet Take 50 mg twice a day 270 tablet 3   Moringa Oleifera (MORINGA PO) Take 1 packet by mouth daily.      MULTIPLE VITAMIN PO Take 1 tablet by mouth daily.      NATTOKINASE PO Take 1 tablet by mouth every evening. Helps blood flow     Nebivolol HCl (BYSTOLIC) 20 MG TABS Take 20 mg daily 90 tablet 3   NON FORMULARY Chlorella:  Takes 15 capsules daily     NON FORMULARY Super Beet powder that she adds to her smoothie     OVER THE COUNTER MEDICATION Take 1 packet by mouth daily. Mushroom supplement     Probiotic Product (PROBIOTIC PO) Take 1 tablet by mouth daily. Bifido Beadlet 50+     SPIRULINA PO Take 1 teaspoon by mouth daily     aspirin 81 MG chewable tablet Chew 1 tablet (81 mg total) by mouth daily. (Patient  not taking: Reported on 11/18/2021) 30 tablet 1   No current facility-administered medications for this visit.    Allergies:   Isovue [iopamidol], Erythromycin base, Penicillins, and Sulfa antibiotics    ROS:  Please see the history of present illness.   Otherwise, review of systems are positive for none.   All other systems are reviewed and negative.    PHYSICAL EXAM: VS:  BP 130/88   Pulse 62   Ht 5\' 4"  (1.626 m)   Wt 174 lb (78.9 kg)   SpO2 96%   BMI 29.87 kg/m  , BMI Body mass index is 29.87 kg/m. GENERAL:  Well appearing NECK:  No jugular venous distention, waveform within normal limits, carotid upstroke brisk and symmetric, no bruits, no thyromegaly LUNGS:  Clear to auscultation bilaterally CHEST:  Unremarkable HEART:  PMI not displaced or sustained,S1 and S2 within normal limits, no S3, no S4, no clicks, no rubs, no murmurs ABD:  Flat, positive bowel sounds normal in frequency in pitch, no bruits, no rebound, no guarding, no midline  pulsatile mass, no hepatomegaly, no splenomegaly EXT:  2 plus pulses throughout, no edema, no cyanosis no clubbing   EKG:  EKG not ordered today The ekg ordered today demonstrates sinus rhythm, rate 66, left bundle branch block, no acute ST-T wave changes.  06/08/2021   Recent Labs: 05/25/2021: BUN 14; Creatinine, Ser 1.12; Hemoglobin 14.5; Platelets 185; Potassium 3.5; Sodium 139    Lipid Panel    Component Value Date/Time   CHOL 166 02/20/2020 1206   TRIG 70.0 02/20/2020 1206   HDL 64.30 02/20/2020 1206   CHOLHDL 3 02/20/2020 1206   VLDL 14.0 02/20/2020 1206   LDLCALC 88 02/20/2020 1206      Wt Readings from Last 3 Encounters:  11/18/21 174 lb (78.9 kg)  06/08/21 170 lb (77.1 kg)  05/28/21 171 lb (77.6 kg)      Other studies Reviewed: Additional studies/ records that were reviewed today include: Hospital records Review of the above records demonstrates:  Please see elsewhere in the note.     ASSESSMENT AND PLAN:  CVA:   No change in therapy.  She continues to have some unchanged vague residual symptoms.   HTN:    She really wants to go back to the Bystolic and I will resume this and see if we can get her the branded Bystolic as she does not tolerate the generic.  This would be 20 mg.  If we restart this I probably will be able to cut back on the hydralazine.   LBBB: This has been chronic.  No change in therapy.   ASCENDING AORTIC DILATATION:      She needs follow up with a CT.   however, she has contrast allergy and she does not want to take.  Therefore, we will follow with an MRI.  Her aorta was on echocardiogram.   Current medicines are reviewed at length with the patient today.  The patient does not have concerns regarding medicines.  The following changes have been made:  As above  Labs/ tests ordered today include:   Orders Placed This Encounter  Procedures   MR Angiogram Chest W Wo Contrast     Disposition:   FU with APP in six months.     Signed, M, MD  11/18/2021 11:51 AM    [facility]

## 2021-11-18 ENCOUNTER — Ambulatory Visit: Payer: Medicare Other | Attending: Cardiology | Admitting: Cardiology

## 2021-11-18 ENCOUNTER — Encounter: Payer: Self-pay | Admitting: Cardiology

## 2021-11-18 VITALS — BP 130/88 | HR 62 | Ht 64.0 in | Wt 174.0 lb

## 2021-11-18 DIAGNOSIS — I10 Essential (primary) hypertension: Secondary | ICD-10-CM | POA: Diagnosis not present

## 2021-11-18 DIAGNOSIS — I7121 Aneurysm of the ascending aorta, without rupture: Secondary | ICD-10-CM | POA: Insufficient documentation

## 2021-11-18 DIAGNOSIS — I2510 Atherosclerotic heart disease of native coronary artery without angina pectoris: Secondary | ICD-10-CM

## 2021-11-18 DIAGNOSIS — I447 Left bundle-branch block, unspecified: Secondary | ICD-10-CM | POA: Insufficient documentation

## 2021-11-18 MED ORDER — NEBIVOLOL HCL 20 MG PO TABS: ORAL_TABLET | ORAL | 3 refills | Status: DC

## 2021-11-18 MED ORDER — BYSTOLIC 20 MG PO TABS
ORAL_TABLET | ORAL | 3 refills | Status: DC
Start: 2021-11-18 — End: 2022-04-02

## 2021-11-18 NOTE — Addendum Note (Signed)
Addended by: Neoma Laming on: 11/18/2021 12:21 PM   Modules accepted: Orders

## 2021-11-18 NOTE — Patient Instructions (Signed)
Medication Instructions:  Start Bystolic 20 mg daily Continue all other medications *If you need a refill on your cardiac medications before your next appointment, please call your pharmacy*   Lab Work: None ordered   Testing/Procedures: MRI without contrast of chest   Follow-Up: At [facility], you and your health needs are our priority.  As part of our continuing mission to provide you with exceptional heart care, we have created designated Provider Care Teams.  These Care Teams include your primary Cardiologist (physician) and Advanced Practice Providers (APPs -  Physician Assistants and Nurse Practitioners) who all work together to provide you with the care you need, when you need it.  We recommend signing up for the patient portal called "MyChart".  Sign up information is provided on this After Visit Summary.  MyChart is used to connect with patients for Virtual Visits (Telemedicine).  Patients are able to view lab/test results, encounter notes, upcoming appointments, etc.  Non-urgent messages can be sent to your provider as well.   To learn more about what you can do with MyChart, go to [email or web address].    Your next appointment:  6 months   Call in Oct to schedule March appointment     The format for your next appointment: Office    Provider:  Dr.Hochrein   Important Information About Sugar

## 2021-12-14 ENCOUNTER — Ambulatory Visit (HOSPITAL_COMMUNITY): Admission: RE | Admit: 2021-12-14 | Payer: Medicare Other | Source: Ambulatory Visit

## 2022-04-02 ENCOUNTER — Telehealth: Payer: Self-pay | Admitting: Cardiology

## 2022-04-02 ENCOUNTER — Other Ambulatory Visit: Payer: Self-pay | Admitting: Cardiology

## 2022-04-02 MED ORDER — BYSTOLIC 20 MG PO TABS
ORAL_TABLET | ORAL | 3 refills | Status: DC
Start: 2022-04-02 — End: 2023-06-06

## 2022-04-02 NOTE — Telephone Encounter (Signed)
*  STAT* If patient is at the pharmacy, call can be transferred to refill team.   1. Which medications need to be refilled? (please list name of each medication and dose if known) BYSTOLIC 20 MG TABS   2. Which pharmacy/location (including street and city if local pharmacy) is medication to be sent to? [facility] # [place], [place]   3. Do they need a 30 day or 90 day supply? [place]

## 2022-04-16 DIAGNOSIS — F4322 Adjustment disorder with anxiety: Secondary | ICD-10-CM | POA: Diagnosis not present

## 2022-04-20 DIAGNOSIS — F4322 Adjustment disorder with anxiety: Secondary | ICD-10-CM | POA: Diagnosis not present

## 2022-04-27 DIAGNOSIS — F4322 Adjustment disorder with anxiety: Secondary | ICD-10-CM | POA: Diagnosis not present

## 2022-05-02 IMAGING — CT CT HEAD W/O CM
4 series · 17 of 47 positions shown, 19 images · non-contrast
Comparison: Head CT dated September 03, 2019

CLINICAL DATA: Evaluate for intracranial hemorrhage



[Series 3: head wo · axial · 0.42mm/px · z∈[-80,+40]mm · 7 of 34 slices shown, 9 images]
[im 5/34  brain]
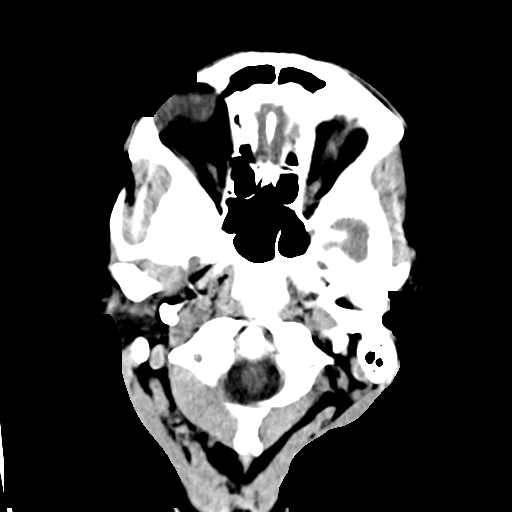
[im 5/34  bone]
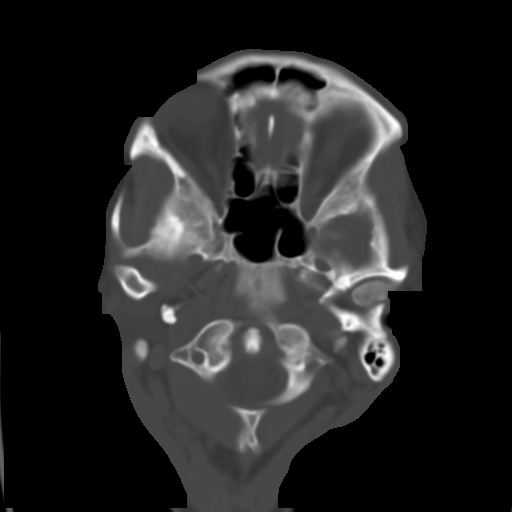
[im 9/34  brain]
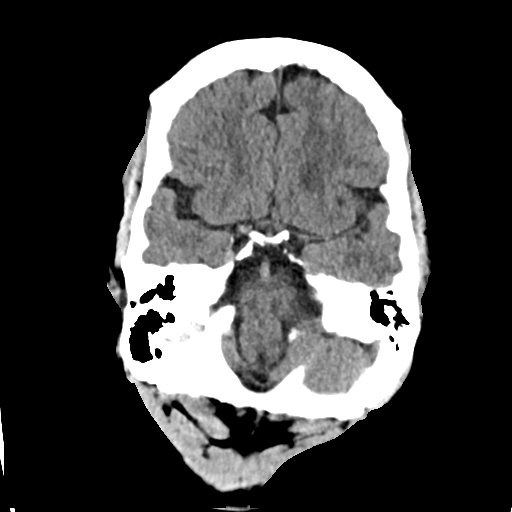
[im 13/34  brain]
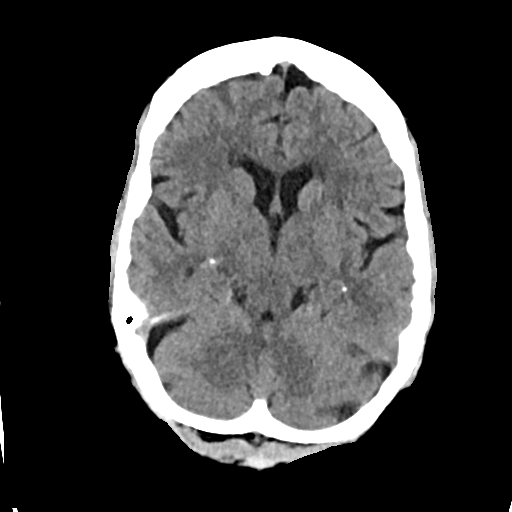
[im 17/34  brain]
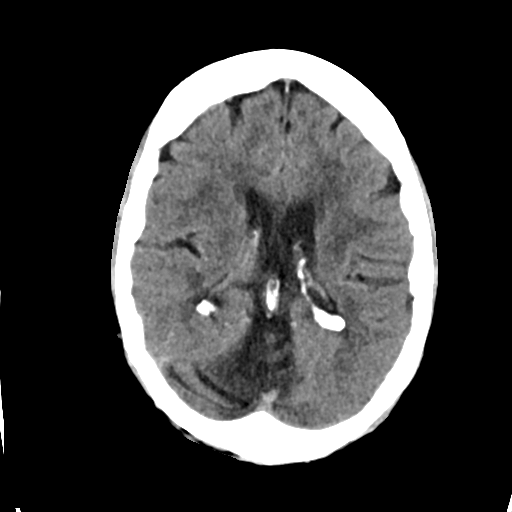
[im 21/34  brain]
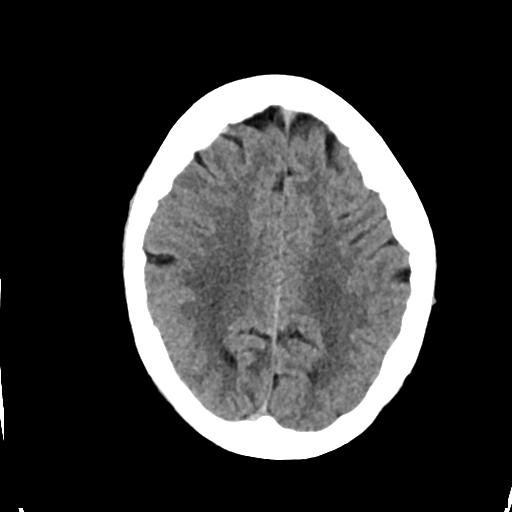
[im 21/34  bone]
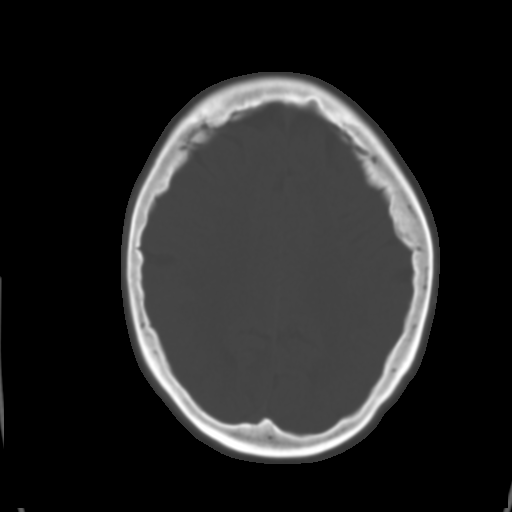
[im 25/34  brain]
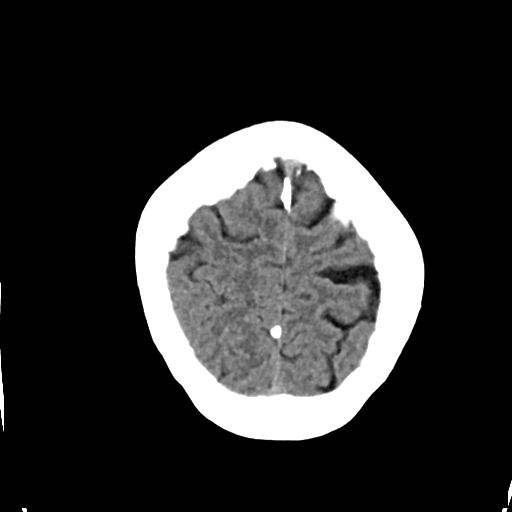
[im 29/34  brain]
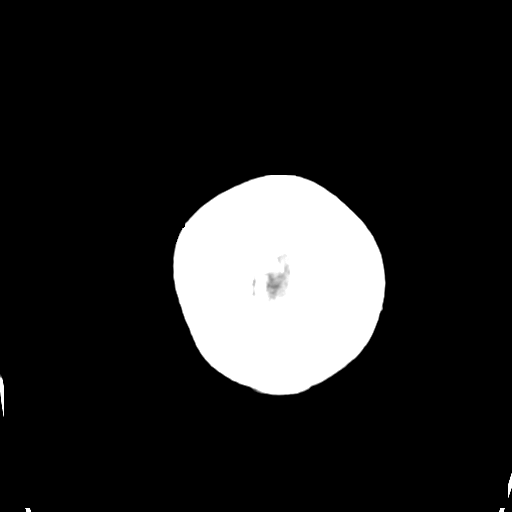

[Series 4: head bone · axial · 0.42mm/px · z∈[-84,-26]mm · 4 of 84 slices shown]
[im 9/84  bone]
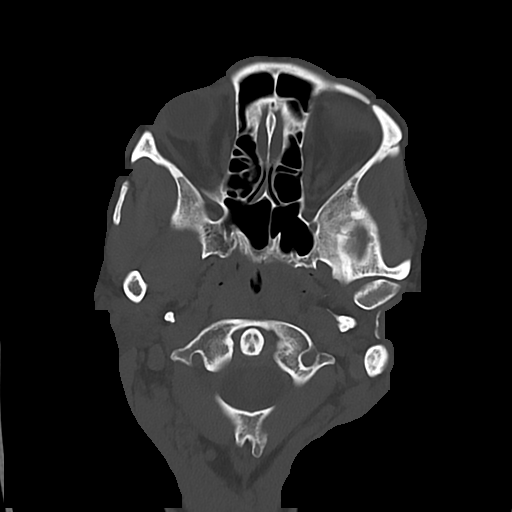
[im 17/84  bone]
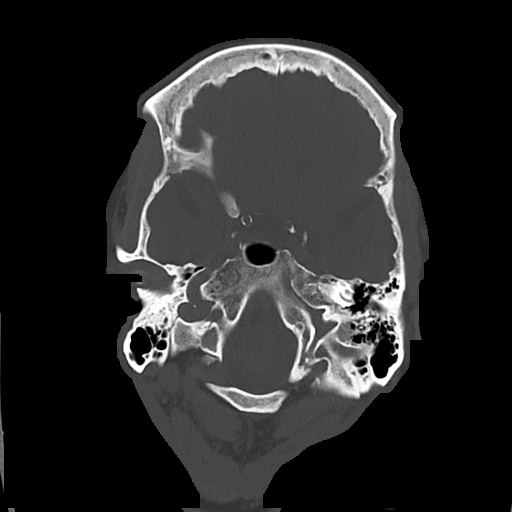
[im 25/84  bone]
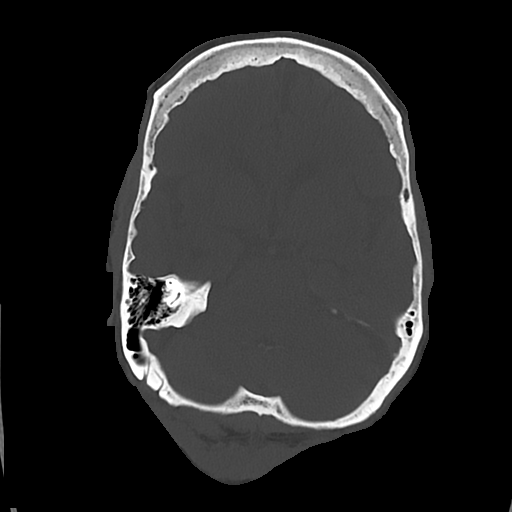
[im 38/84  bone]
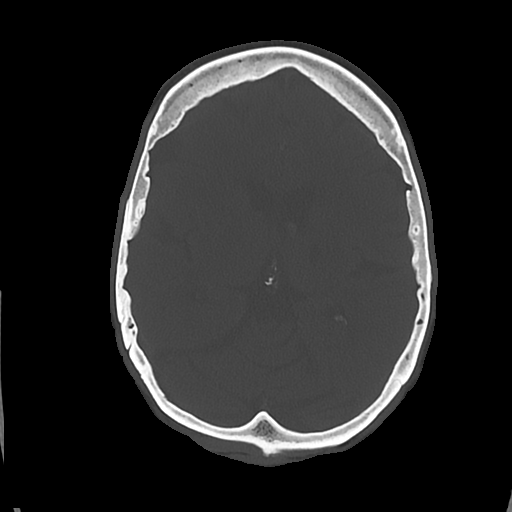

[Series 5: cor soft · coronal · 0.33mm/px · 3 of 74 slices shown]
[im 29/74  brain]
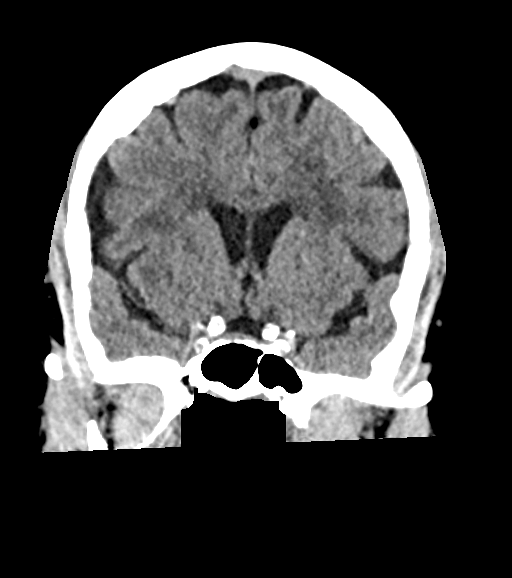
[im 34/74  brain]
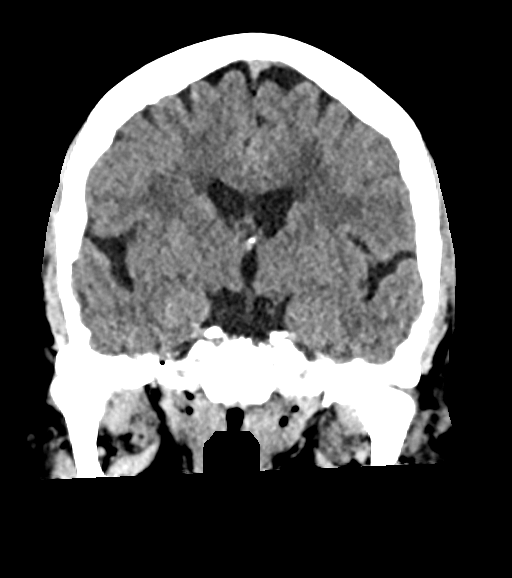
[im 40/74  brain]
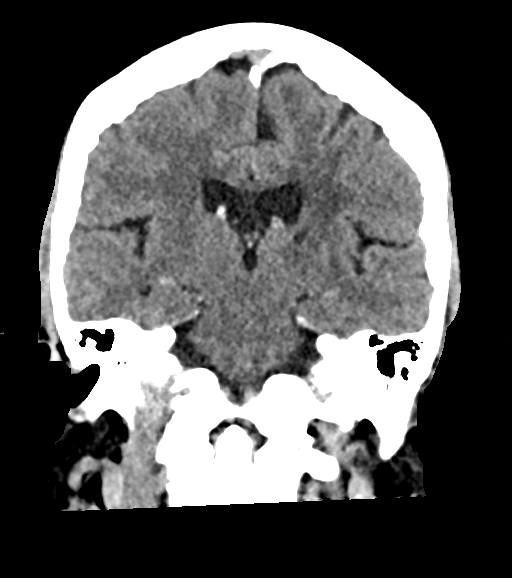

[Series 6: sag soft · sagittal · 0.37mm/px · 3 of 57 slices shown]
[im 19/57  brain]
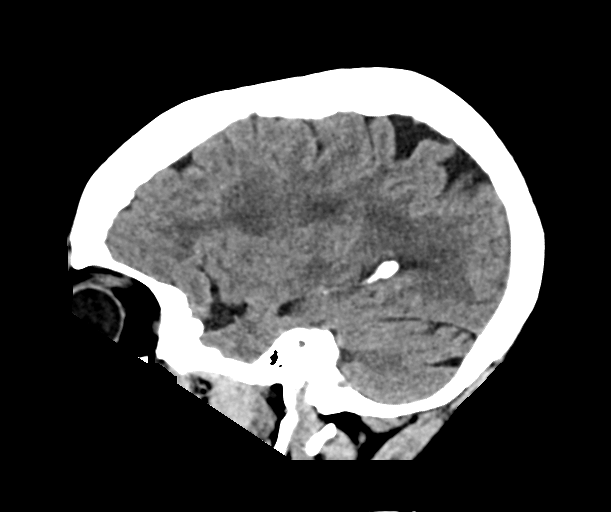
[im 29/57  brain]
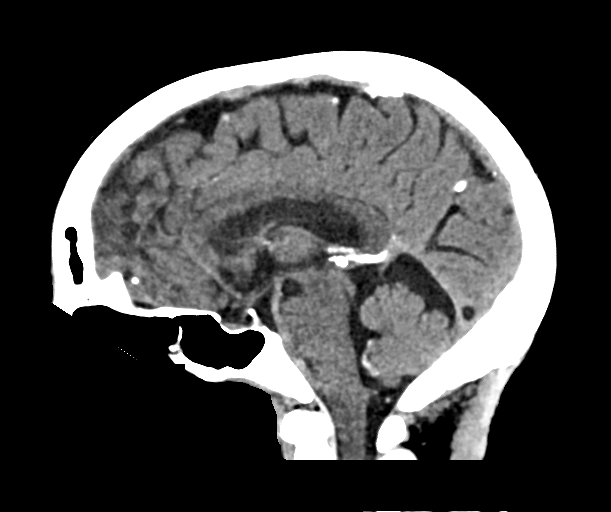
[im 38/57  brain]
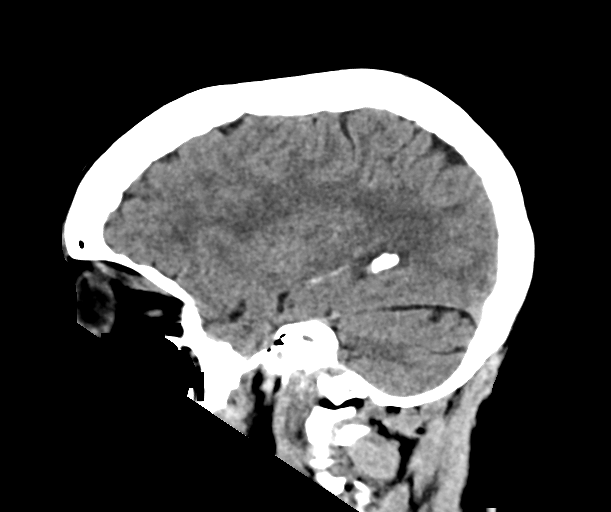

[17 of 47 positions shown; findings below may reference images not displayed]

FINDINGS: Brain: Chronic white matter ischemic change. Old left basal ganglia
lacunar infarcts. No evidence of acute infarction, hemorrhage,
hydrocephalus, extra-axial collection or mass lesion/mass effect.

Vascular: No hyperdense vessel or unexpected calcification.

Skull: Normal. Negative for fracture or focal lesion.

Sinuses/Orbits: No acute finding.

Other: None.
IMPRESSION: No acute intracranial abnormality.

## 2022-05-04 DIAGNOSIS — F4322 Adjustment disorder with anxiety: Secondary | ICD-10-CM | POA: Diagnosis not present

## 2022-05-11 DIAGNOSIS — F4322 Adjustment disorder with anxiety: Secondary | ICD-10-CM | POA: Diagnosis not present

## 2022-05-18 DIAGNOSIS — F4322 Adjustment disorder with anxiety: Secondary | ICD-10-CM | POA: Diagnosis not present

## 2022-05-25 DIAGNOSIS — F4322 Adjustment disorder with anxiety: Secondary | ICD-10-CM | POA: Diagnosis not present

## 2022-05-27 ENCOUNTER — Ambulatory Visit: Admitting: Cardiology

## 2022-05-31 ENCOUNTER — Ambulatory Visit (INDEPENDENT_AMBULATORY_CARE_PROVIDER_SITE_OTHER): Payer: Medicare Other

## 2022-05-31 VITALS — Ht 64.0 in | Wt 179.0 lb

## 2022-05-31 DIAGNOSIS — Z0000 Encounter for general adult medical examination without abnormal findings: Secondary | ICD-10-CM | POA: Diagnosis not present
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
## 2022-05-31 NOTE — Patient Instructions (Addendum)
[name] , Thank you for taking time to come for your Medicare Wellness Visit. I appreciate your ongoing commitment to your health goals. Please review the following plan we discussed and let me know if I can assist you in the future.   These are the goals we discussed:  Goals       Patient Stated (pt-stated)      Working towards getting healthier to get off meds and lose weight.        This is a list of the screening recommended for you and due dates:  Health Maintenance  Topic Date Due   DTaP/Tdap/Td vaccine (1 - Tdap) Never done   Flu Shot  06/13/2022*   COVID-19 Vaccine (1) 06/16/2022*   Zoster (Shingles) Vaccine (1 of 2) 08/31/2022*   Pneumonia Vaccine (1 of 1 - PCV) 05/31/2023*   Mammogram  05/31/2023*   Colon Cancer Screening  05/31/2023*   Hepatitis C Screening: USPSTF Recommendation to screen - Ages 18-79 yo.  05/31/2023*   Medicare Annual Wellness Visit  05/31/2023   DEXA scan (bone density measurement)  Completed   HPV Vaccine  Aged Out  *Topic was postponed. The date shown is not the original due date.    Advanced directives: Advance directive discussed with you today. Even though you declined this today, please call our office should you change your mind, and we can give you the proper paperwork for you to fill out.   Conditions/risks identified: None  Next appointment: Follow up in one year for your annual wellness visit    Preventive Care 65 Years and Older, Female Preventive care refers to lifestyle choices and visits with your health care provider that can promote health and wellness. What does preventive care include? A yearly physical exam. This is also called an annual well check. Dental exams once or twice a year. Routine eye exams. Ask your health care provider how often you should have your eyes checked. Personal lifestyle choices, including: Daily care of your teeth and gums. Regular physical activity. Eating a healthy diet. Avoiding  tobacco and drug use. Limiting alcohol use. Practicing safe sex. Taking low-dose aspirin every day. Taking vitamin and mineral supplements as recommended by your health care provider. What happens during an annual well check? The services and screenings done by your health care provider during your annual well check will depend on your age, overall health, lifestyle risk factors, and family history of disease. Counseling  Your health care provider may ask you questions about your: Alcohol use. Tobacco use. Drug use. Emotional well-being. Home and relationship well-being. Sexual activity. Eating habits. History of falls. Memory and ability to understand (cognition). Work and work Statistician. Reproductive health. Screening  You may have the following tests or measurements: Height, weight, and BMI. Blood pressure. Lipid and cholesterol levels. These may be checked every 5 years, or more frequently if you are over 71 years old. Skin check. Lung cancer screening. You may have this screening every year starting at age 71 if you have a 30-pack-year history of smoking and currently smoke or have quit within the past 15 years. Fecal occult blood test (FOBT) of the stool. You may have this test every year starting at age 11. Flexible sigmoidoscopy or colonoscopy. You may have a sigmoidoscopy every 5 years or a colonoscopy every 10 years starting at age 71. Hepatitis C blood test. Hepatitis B blood test. Sexually transmitted disease (STD) testing. Diabetes screening. This is done by checking your blood sugar (glucose) after  you have not eaten for a while (fasting). You may have this done every 1-3 years. Bone density scan. This is done to screen for osteoporosis. You may have this done starting at age 71. Mammogram. This may be done every 1-2 years. Talk to your health care provider about how often you should have regular mammograms. Talk with your health care provider about your test  results, treatment options, and if necessary, the need for more tests. Vaccines  Your health care provider may recommend certain vaccines, such as: Influenza vaccine. This is recommended every year. Tetanus, diphtheria, and acellular pertussis (Tdap, Td) vaccine. You may need a Td booster every 10 years. Zoster vaccine. You may need this after age 71. Pneumococcal 13-valent conjugate (PCV13) vaccine. One dose is recommended after age 71. Pneumococcal polysaccharide (PPSV23) vaccine. One dose is recommended after age 71. Talk to your health care provider about which screenings and vaccines you need and how often you need them. This information is not intended to replace advice given to you by your health care provider. Make sure you discuss any questions you have with your health care provider. Document Released: 03/28/2015 Document Revised: 11/19/2015 Document Reviewed: 12/31/2014 Elsevier Interactive Patient Education  2017 [place] Prevention in the Home Falls can cause injuries. They can happen to people of all ages. There are many things you can do to make your home safe and to help prevent falls. What can I do on the outside of my home? Regularly fix the edges of walkways and driveways and fix any cracks. Remove anything that might make you trip as you walk through a door, such as a raised step or threshold. Trim any bushes or trees on the path to your home. Use bright outdoor lighting. Clear any walking paths of anything that might make someone trip, such as rocks or tools. Regularly check to see if handrails are loose or broken. Make sure that both sides of any steps have handrails. Any raised decks and porches should have guardrails on the edges. Have any leaves, snow, or ice cleared regularly. Use sand or salt on walking paths during winter. Clean up any spills in your garage right away. This includes oil or grease spills. What can I do in the bathroom? Use night  lights. Install grab bars by the toilet and in the tub and shower. Do not use towel bars as grab bars. Use non-skid mats or decals in the tub or shower. If you need to sit down in the shower, use a plastic, non-slip stool. Keep the floor dry. Clean up any water that spills on the floor as soon as it happens. Remove soap buildup in the tub or shower regularly. Attach bath mats securely with double-sided non-slip rug tape. Do not have throw rugs and other things on the floor that can make you trip. What can I do in the bedroom? Use night lights. Make sure that you have a light by your bed that is easy to reach. Do not use any sheets or blankets that are too big for your bed. They should not hang down onto the floor. Have a firm chair that has side arms. You can use this for support while you get dressed. Do not have throw rugs and other things on the floor that can make you trip. What can I do in the kitchen? Clean up any spills right away. Avoid walking on wet floors. Keep items that you use a lot in easy-to-reach places. If you  need to reach something above you, use a strong step stool that has a grab bar. Keep electrical cords out of the way. Do not use floor polish or wax that makes floors slippery. If you must use wax, use non-skid floor wax. Do not have throw rugs and other things on the floor that can make you trip. What can I do with my stairs? Do not leave any items on the stairs. Make sure that there are handrails on both sides of the stairs and use them. Fix handrails that are broken or loose. Make sure that handrails are as long as the stairways. Check any carpeting to make sure that it is firmly attached to the stairs. Fix any carpet that is loose or worn. Avoid having throw rugs at the top or bottom of the stairs. If you do have throw rugs, attach them to the floor with carpet tape. Make sure that you have a light switch at the top of the stairs and the bottom of the stairs. If  you do not have them, ask someone to add them for you. What else can I do to help prevent falls? Wear shoes that: Do not have high heels. Have rubber bottoms. Are comfortable and fit you well. Are closed at the toe. Do not wear sandals. If you use a stepladder: Make sure that it is fully opened. Do not climb a closed stepladder. Make sure that both sides of the stepladder are locked into place. Ask someone to hold it for you, if possible. Clearly mark and make sure that you can see: Any grab bars or handrails. First and last steps. Where the edge of each step is. Use tools that help you move around (mobility aids) if they are needed. These include: Canes. Walkers. Scooters. Crutches. Turn on the lights when you go into a dark area. Replace any light bulbs as soon as they burn out. Set up your furniture so you have a clear path. Avoid moving your furniture around. If any of your floors are uneven, fix them. If there are any pets around you, be aware of where they are. Review your medicines with your doctor. Some medicines can make you feel dizzy. This can increase your chance of falling. Ask your doctor what other things that you can do to help prevent falls. This information is not intended to replace advice given to you by your health care provider. Make sure you discuss any questions you have with your health care provider. Document Released: 12/26/2008 Document Revised: 08/07/2015 Document Reviewed: 04/05/2014 Elsevier Interactive Patient Education  2017 [employer].

## 2022-06-01 DIAGNOSIS — F4322 Adjustment disorder with anxiety: Secondary | ICD-10-CM | POA: Diagnosis not present

## 2022-06-08 DIAGNOSIS — F4322 Adjustment disorder with anxiety: Secondary | ICD-10-CM | POA: Diagnosis not present

## 2022-06-15 DIAGNOSIS — F4322 Adjustment disorder with anxiety: Secondary | ICD-10-CM | POA: Diagnosis not present

## 2022-06-22 DIAGNOSIS — F4322 Adjustment disorder with anxiety: Secondary | ICD-10-CM | POA: Diagnosis not present

## 2022-06-29 DIAGNOSIS — F4322 Adjustment disorder with anxiety: Secondary | ICD-10-CM | POA: Diagnosis not present

## 2022-07-06 DIAGNOSIS — F4322 Adjustment disorder with anxiety: Secondary | ICD-10-CM | POA: Diagnosis not present

## 2022-07-13 DIAGNOSIS — F4322 Adjustment disorder with anxiety: Secondary | ICD-10-CM | POA: Diagnosis not present

## 2022-07-20 DIAGNOSIS — F4322 Adjustment disorder with anxiety: Secondary | ICD-10-CM | POA: Diagnosis not present

## 2022-07-26 ENCOUNTER — Ambulatory Visit: Admitting: Cardiology

## 2022-07-27 DIAGNOSIS — F4322 Adjustment disorder with anxiety: Secondary | ICD-10-CM | POA: Diagnosis not present

## 2022-08-03 DIAGNOSIS — F4322 Adjustment disorder with anxiety: Secondary | ICD-10-CM | POA: Diagnosis not present

## 2022-08-10 DIAGNOSIS — F4322 Adjustment disorder with anxiety: Secondary | ICD-10-CM | POA: Diagnosis not present

## 2022-08-17 DIAGNOSIS — F4322 Adjustment disorder with anxiety: Secondary | ICD-10-CM | POA: Diagnosis not present

## 2022-08-24 DIAGNOSIS — F4322 Adjustment disorder with anxiety: Secondary | ICD-10-CM | POA: Diagnosis not present

## 2022-08-31 DIAGNOSIS — F4322 Adjustment disorder with anxiety: Secondary | ICD-10-CM | POA: Diagnosis not present

## 2022-09-06 NOTE — Progress Notes (Unsigned)
  Cardiology Office Note:   Date:  09/08/2022  ID:  [name], DOB [birth year], MRN [id number] PCP: Madelin Headings, MD  [facility] Providers Cardiologist:  Rollene Rotunda, MD {  History of Present Illness:   [name] is a 72 y.o. female who presents follow up of difficult to control hypertension.  She has been intolerant of multiple medications.   He has not tolerated carvedilol, amlodipine, spironolactone and most recently hydralazine.  She seems to tolerate only brand-name Bystolic.  He presents for follow-up.  She has been aggravated because she has had sciatica.  Her blood pressure today is elevated but she says that at home it is in the 140s systolic or lower and diastolic is controlled.  If she sees it being 160/100 or so she will take her clonidine which he is only had to do about 4 times since I last saw her.  ROS: As stated in the HPI and negative for all other systems.  Studies Reviewed:    EKG:   EKG Interpretation  Date/Time:  Wednesday September 08 2022 11:47:16 EDT Ventricular Rate:  67 PR Interval:  224 QRS Duration: 164 QT Interval:  466 QTC Calculation: 492 R Axis:   -52 Text Interpretation: Sinus rhythm with 1st degree A-V block Left axis deviation Left bundle branch block When compared with ECG of 25-May-2021 16:36, No significant change since last tracing Confirmed by Rollene Rotunda ([id number]) on 09/08/2022 1:11:11 PM     Risk Assessment/Calculations:           Physical Exam:   VS:  BP (!) 158/100 (BP Location: Left Arm, Patient Position: Sitting, Cuff Size: Large)   Pulse 84   Ht 5\' 3"  (1.6 m)   Wt 182 lb (82.6 kg)   SpO2 94%   BMI 32.24 kg/m    Wt Readings from Last 3 Encounters:  09/08/22 182 lb (82.6 kg)  05/31/22 179 lb (81.2 kg)  11/18/21 174 lb (78.9 kg)     GEN: Well nourished, well developed in no acute distress NECK: No JVD; No carotid bruits CARDIAC: RRR, soft apical systolic murmur, no diastolic murmurs, rubs,  gallops RESPIRATORY:  Clear to auscultation without rales, wheezing or rhonchi  ABDOMEN: Soft, non-tender, non-distended EXTREMITIES:  No edema; No deformity   ASSESSMENT AND PLAN:    CVA:   She has no significant residual from this.  No change in therapy.   HTN:     She has been intolerant of all of multiple medications and tolerates current diastolic brand only and will take her clonidine as needed.  She stopped the hydralazine as it did not make her feel good.  We also discussed therapeutic lifestyle changes.  LBBB: This has been.  No change in therapy.   ASCENDING AORTIC DILATATION:      She has a contrast allergy and does not want CT somebody tried to follow- with an MRA.          Follow up me in 1 year or sooner if needed.  Signed, Rollene Rotunda, MD

## 2022-09-07 DIAGNOSIS — F4322 Adjustment disorder with anxiety: Secondary | ICD-10-CM | POA: Diagnosis not present

## 2022-09-08 ENCOUNTER — Encounter: Payer: Self-pay | Admitting: Cardiology

## 2022-09-08 ENCOUNTER — Ambulatory Visit: Payer: Medicare Other | Attending: Cardiology | Admitting: Cardiology

## 2022-09-08 VITALS — BP 158/100 | HR 84 | Ht 63.0 in | Wt 182.0 lb

## 2022-09-08 DIAGNOSIS — I447 Left bundle-branch block, unspecified: Secondary | ICD-10-CM

## 2022-09-08 DIAGNOSIS — I10 Essential (primary) hypertension: Secondary | ICD-10-CM

## 2022-09-08 DIAGNOSIS — I77810 Thoracic aortic ectasia: Secondary | ICD-10-CM

## 2022-09-08 NOTE — Patient Instructions (Signed)
Medication Instructions:  Your physician recommends that you continue on your current medications as directed. Please refer to the Current Medication list given to you today.  *If you need a refill on your cardiac medications before your next appointment, please call your pharmacy*   Testing/Procedures: MRI of chest without contrast    Follow-Up: At [facility], you and your health needs are our priority.  As part of our continuing mission to provide you with exceptional heart care, we have created designated Provider Care Teams.  These Care Teams include your primary Cardiologist (physician) and Advanced Practice Providers (APPs -  Physician Assistants and Nurse Practitioners) who all work together to provide you with the care you need, when you need it.  We recommend signing up for the patient portal called "MyChart".  Sign up information is provided on this After Visit Summary.  MyChart is used to connect with patients for Virtual Visits (Telemedicine).  Patients are able to view lab/test results, encounter notes, upcoming appointments, etc.  Non-urgent messages can be sent to your provider as well.   To learn more about what you can do with MyChart, go to [email or web address].    Your next appointment:   12 month(s)  Provider:   Rollene Rotunda, MD

## 2022-09-14 DIAGNOSIS — F4322 Adjustment disorder with anxiety: Secondary | ICD-10-CM | POA: Diagnosis not present

## 2022-10-05 DIAGNOSIS — F4322 Adjustment disorder with anxiety: Secondary | ICD-10-CM | POA: Diagnosis not present

## 2022-10-12 DIAGNOSIS — F4322 Adjustment disorder with anxiety: Secondary | ICD-10-CM | POA: Diagnosis not present

## 2022-10-19 DIAGNOSIS — F4322 Adjustment disorder with anxiety: Secondary | ICD-10-CM | POA: Diagnosis not present

## 2022-10-26 DIAGNOSIS — F4322 Adjustment disorder with anxiety: Secondary | ICD-10-CM | POA: Diagnosis not present

## 2022-11-02 DIAGNOSIS — F4322 Adjustment disorder with anxiety: Secondary | ICD-10-CM | POA: Diagnosis not present

## 2022-11-03 DIAGNOSIS — M5431 Sciatica, right side: Secondary | ICD-10-CM | POA: Diagnosis not present

## 2022-11-03 DIAGNOSIS — M50322 Other cervical disc degeneration at C5-C6 level: Secondary | ICD-10-CM | POA: Diagnosis not present

## 2022-11-03 DIAGNOSIS — M791 Myalgia, unspecified site: Secondary | ICD-10-CM | POA: Diagnosis not present

## 2022-11-03 DIAGNOSIS — M9901 Segmental and somatic dysfunction of cervical region: Secondary | ICD-10-CM | POA: Diagnosis not present

## 2022-11-03 DIAGNOSIS — M9902 Segmental and somatic dysfunction of thoracic region: Secondary | ICD-10-CM | POA: Diagnosis not present

## 2022-11-03 DIAGNOSIS — M542 Cervicalgia: Secondary | ICD-10-CM | POA: Diagnosis not present

## 2022-11-03 DIAGNOSIS — M9903 Segmental and somatic dysfunction of lumbar region: Secondary | ICD-10-CM | POA: Diagnosis not present

## 2022-11-03 DIAGNOSIS — M5137 Other intervertebral disc degeneration, lumbosacral region: Secondary | ICD-10-CM | POA: Diagnosis not present

## 2022-11-08 DIAGNOSIS — M542 Cervicalgia: Secondary | ICD-10-CM | POA: Diagnosis not present

## 2022-11-08 DIAGNOSIS — M9901 Segmental and somatic dysfunction of cervical region: Secondary | ICD-10-CM | POA: Diagnosis not present

## 2022-11-08 DIAGNOSIS — M791 Myalgia, unspecified site: Secondary | ICD-10-CM | POA: Diagnosis not present

## 2022-11-08 DIAGNOSIS — M9903 Segmental and somatic dysfunction of lumbar region: Secondary | ICD-10-CM | POA: Diagnosis not present

## 2022-11-08 DIAGNOSIS — M9902 Segmental and somatic dysfunction of thoracic region: Secondary | ICD-10-CM | POA: Diagnosis not present

## 2022-11-08 DIAGNOSIS — M5137 Other intervertebral disc degeneration, lumbosacral region: Secondary | ICD-10-CM | POA: Diagnosis not present

## 2022-11-08 DIAGNOSIS — M50322 Other cervical disc degeneration at C5-C6 level: Secondary | ICD-10-CM | POA: Diagnosis not present

## 2022-11-08 DIAGNOSIS — M5431 Sciatica, right side: Secondary | ICD-10-CM | POA: Diagnosis not present

## 2022-11-09 DIAGNOSIS — F4322 Adjustment disorder with anxiety: Secondary | ICD-10-CM | POA: Diagnosis not present

## 2022-11-16 DIAGNOSIS — F4322 Adjustment disorder with anxiety: Secondary | ICD-10-CM | POA: Diagnosis not present

## 2022-11-18 DIAGNOSIS — M5431 Sciatica, right side: Secondary | ICD-10-CM | POA: Diagnosis not present

## 2022-11-18 DIAGNOSIS — M542 Cervicalgia: Secondary | ICD-10-CM | POA: Diagnosis not present

## 2022-11-18 DIAGNOSIS — M9903 Segmental and somatic dysfunction of lumbar region: Secondary | ICD-10-CM | POA: Diagnosis not present

## 2022-11-18 DIAGNOSIS — M9902 Segmental and somatic dysfunction of thoracic region: Secondary | ICD-10-CM | POA: Diagnosis not present

## 2022-11-18 DIAGNOSIS — M50322 Other cervical disc degeneration at C5-C6 level: Secondary | ICD-10-CM | POA: Diagnosis not present

## 2022-11-18 DIAGNOSIS — M5137 Other intervertebral disc degeneration, lumbosacral region: Secondary | ICD-10-CM | POA: Diagnosis not present

## 2022-11-18 DIAGNOSIS — M791 Myalgia, unspecified site: Secondary | ICD-10-CM | POA: Diagnosis not present

## 2022-11-18 DIAGNOSIS — M9901 Segmental and somatic dysfunction of cervical region: Secondary | ICD-10-CM | POA: Diagnosis not present

## 2022-11-23 DIAGNOSIS — F4322 Adjustment disorder with anxiety: Secondary | ICD-10-CM | POA: Diagnosis not present

## 2022-11-24 DIAGNOSIS — M9903 Segmental and somatic dysfunction of lumbar region: Secondary | ICD-10-CM | POA: Diagnosis not present

## 2022-11-24 DIAGNOSIS — M9901 Segmental and somatic dysfunction of cervical region: Secondary | ICD-10-CM | POA: Diagnosis not present

## 2022-11-24 DIAGNOSIS — M5459 Other low back pain: Secondary | ICD-10-CM | POA: Diagnosis not present

## 2022-11-24 DIAGNOSIS — M542 Cervicalgia: Secondary | ICD-10-CM | POA: Diagnosis not present

## 2022-11-24 DIAGNOSIS — M9902 Segmental and somatic dysfunction of thoracic region: Secondary | ICD-10-CM | POA: Diagnosis not present

## 2022-11-24 DIAGNOSIS — M546 Pain in thoracic spine: Secondary | ICD-10-CM | POA: Diagnosis not present

## 2022-11-30 ENCOUNTER — Telehealth: Payer: Self-pay | Admitting: *Deleted

## 2022-11-30 DIAGNOSIS — F4322 Adjustment disorder with anxiety: Secondary | ICD-10-CM | POA: Diagnosis not present

## 2022-11-30 NOTE — Telephone Encounter (Signed)
Left message for pt to call, she is due to have a mra of the chest to follow up dilated aortic root. According to last office note, she was having done somewhere else. ? Was it done, where can I get the results or do I need to order testing for her?

## 2022-12-01 DIAGNOSIS — M9901 Segmental and somatic dysfunction of cervical region: Secondary | ICD-10-CM | POA: Diagnosis not present

## 2022-12-01 DIAGNOSIS — M9903 Segmental and somatic dysfunction of lumbar region: Secondary | ICD-10-CM | POA: Diagnosis not present

## 2022-12-01 DIAGNOSIS — M542 Cervicalgia: Secondary | ICD-10-CM | POA: Diagnosis not present

## 2022-12-01 DIAGNOSIS — M546 Pain in thoracic spine: Secondary | ICD-10-CM | POA: Diagnosis not present

## 2022-12-01 DIAGNOSIS — M9902 Segmental and somatic dysfunction of thoracic region: Secondary | ICD-10-CM | POA: Diagnosis not present

## 2022-12-01 DIAGNOSIS — M5459 Other low back pain: Secondary | ICD-10-CM | POA: Diagnosis not present

## 2022-12-07 DIAGNOSIS — F4322 Adjustment disorder with anxiety: Secondary | ICD-10-CM | POA: Diagnosis not present

## 2022-12-08 DIAGNOSIS — M546 Pain in thoracic spine: Secondary | ICD-10-CM | POA: Diagnosis not present

## 2022-12-08 DIAGNOSIS — M542 Cervicalgia: Secondary | ICD-10-CM | POA: Diagnosis not present

## 2022-12-08 DIAGNOSIS — M9902 Segmental and somatic dysfunction of thoracic region: Secondary | ICD-10-CM | POA: Diagnosis not present

## 2022-12-08 DIAGNOSIS — M5459 Other low back pain: Secondary | ICD-10-CM | POA: Diagnosis not present

## 2022-12-08 DIAGNOSIS — M9901 Segmental and somatic dysfunction of cervical region: Secondary | ICD-10-CM | POA: Diagnosis not present

## 2022-12-08 DIAGNOSIS — M9903 Segmental and somatic dysfunction of lumbar region: Secondary | ICD-10-CM | POA: Diagnosis not present

## 2022-12-14 DIAGNOSIS — M542 Cervicalgia: Secondary | ICD-10-CM | POA: Diagnosis not present

## 2022-12-14 DIAGNOSIS — M9902 Segmental and somatic dysfunction of thoracic region: Secondary | ICD-10-CM | POA: Diagnosis not present

## 2022-12-14 DIAGNOSIS — M9903 Segmental and somatic dysfunction of lumbar region: Secondary | ICD-10-CM | POA: Diagnosis not present

## 2022-12-14 DIAGNOSIS — M5459 Other low back pain: Secondary | ICD-10-CM | POA: Diagnosis not present

## 2022-12-14 DIAGNOSIS — F4322 Adjustment disorder with anxiety: Secondary | ICD-10-CM | POA: Diagnosis not present

## 2022-12-14 DIAGNOSIS — M9901 Segmental and somatic dysfunction of cervical region: Secondary | ICD-10-CM | POA: Diagnosis not present

## 2022-12-14 DIAGNOSIS — M546 Pain in thoracic spine: Secondary | ICD-10-CM | POA: Diagnosis not present

## 2022-12-21 DIAGNOSIS — F4322 Adjustment disorder with anxiety: Secondary | ICD-10-CM | POA: Diagnosis not present

## 2022-12-22 DIAGNOSIS — M9901 Segmental and somatic dysfunction of cervical region: Secondary | ICD-10-CM | POA: Diagnosis not present

## 2022-12-22 DIAGNOSIS — M9903 Segmental and somatic dysfunction of lumbar region: Secondary | ICD-10-CM | POA: Diagnosis not present

## 2022-12-22 DIAGNOSIS — M546 Pain in thoracic spine: Secondary | ICD-10-CM | POA: Diagnosis not present

## 2022-12-22 DIAGNOSIS — M542 Cervicalgia: Secondary | ICD-10-CM | POA: Diagnosis not present

## 2022-12-22 DIAGNOSIS — M9902 Segmental and somatic dysfunction of thoracic region: Secondary | ICD-10-CM | POA: Diagnosis not present

## 2022-12-22 DIAGNOSIS — M5459 Other low back pain: Secondary | ICD-10-CM | POA: Diagnosis not present

## 2022-12-24 ENCOUNTER — Encounter: Payer: Self-pay | Admitting: *Deleted

## 2022-12-24 NOTE — Telephone Encounter (Signed)
Mychart message sent to the patient .

## 2022-12-28 DIAGNOSIS — F4322 Adjustment disorder with anxiety: Secondary | ICD-10-CM | POA: Diagnosis not present

## 2022-12-29 DIAGNOSIS — M9901 Segmental and somatic dysfunction of cervical region: Secondary | ICD-10-CM | POA: Diagnosis not present

## 2022-12-29 DIAGNOSIS — M546 Pain in thoracic spine: Secondary | ICD-10-CM | POA: Diagnosis not present

## 2022-12-29 DIAGNOSIS — M9903 Segmental and somatic dysfunction of lumbar region: Secondary | ICD-10-CM | POA: Diagnosis not present

## 2022-12-29 DIAGNOSIS — M9902 Segmental and somatic dysfunction of thoracic region: Secondary | ICD-10-CM | POA: Diagnosis not present

## 2022-12-29 DIAGNOSIS — M5459 Other low back pain: Secondary | ICD-10-CM | POA: Diagnosis not present

## 2022-12-29 DIAGNOSIS — M542 Cervicalgia: Secondary | ICD-10-CM | POA: Diagnosis not present

## 2022-12-30 ENCOUNTER — Encounter: Payer: Self-pay | Admitting: *Deleted

## 2022-12-30 NOTE — Telephone Encounter (Signed)
Letter out to patient asking her to call if testing needs to be ordered by [name].

## 2023-01-04 DIAGNOSIS — F4322 Adjustment disorder with anxiety: Secondary | ICD-10-CM | POA: Diagnosis not present

## 2023-01-05 DIAGNOSIS — M9901 Segmental and somatic dysfunction of cervical region: Secondary | ICD-10-CM | POA: Diagnosis not present

## 2023-01-05 DIAGNOSIS — M9902 Segmental and somatic dysfunction of thoracic region: Secondary | ICD-10-CM | POA: Diagnosis not present

## 2023-01-05 DIAGNOSIS — M5459 Other low back pain: Secondary | ICD-10-CM | POA: Diagnosis not present

## 2023-01-05 DIAGNOSIS — M546 Pain in thoracic spine: Secondary | ICD-10-CM | POA: Diagnosis not present

## 2023-01-05 DIAGNOSIS — M542 Cervicalgia: Secondary | ICD-10-CM | POA: Diagnosis not present

## 2023-01-05 DIAGNOSIS — M9903 Segmental and somatic dysfunction of lumbar region: Secondary | ICD-10-CM | POA: Diagnosis not present

## 2023-01-11 DIAGNOSIS — F4322 Adjustment disorder with anxiety: Secondary | ICD-10-CM | POA: Diagnosis not present

## 2023-01-12 DIAGNOSIS — M9903 Segmental and somatic dysfunction of lumbar region: Secondary | ICD-10-CM | POA: Diagnosis not present

## 2023-01-12 DIAGNOSIS — M9902 Segmental and somatic dysfunction of thoracic region: Secondary | ICD-10-CM | POA: Diagnosis not present

## 2023-01-12 DIAGNOSIS — M5137 Other intervertebral disc degeneration, lumbosacral region with discogenic back pain only: Secondary | ICD-10-CM | POA: Diagnosis not present

## 2023-01-12 DIAGNOSIS — M5431 Sciatica, right side: Secondary | ICD-10-CM | POA: Diagnosis not present

## 2023-01-12 DIAGNOSIS — M542 Cervicalgia: Secondary | ICD-10-CM | POA: Diagnosis not present

## 2023-01-12 DIAGNOSIS — M9901 Segmental and somatic dysfunction of cervical region: Secondary | ICD-10-CM | POA: Diagnosis not present

## 2023-01-12 DIAGNOSIS — M791 Myalgia, unspecified site: Secondary | ICD-10-CM | POA: Diagnosis not present

## 2023-01-12 DIAGNOSIS — M50322 Other cervical disc degeneration at C5-C6 level: Secondary | ICD-10-CM | POA: Diagnosis not present

## 2023-01-18 DIAGNOSIS — F4322 Adjustment disorder with anxiety: Secondary | ICD-10-CM | POA: Diagnosis not present

## 2023-01-25 DIAGNOSIS — F4322 Adjustment disorder with anxiety: Secondary | ICD-10-CM | POA: Diagnosis not present

## 2023-01-26 DIAGNOSIS — M5137 Other intervertebral disc degeneration, lumbosacral region with discogenic back pain only: Secondary | ICD-10-CM | POA: Diagnosis not present

## 2023-01-26 DIAGNOSIS — M9901 Segmental and somatic dysfunction of cervical region: Secondary | ICD-10-CM | POA: Diagnosis not present

## 2023-01-26 DIAGNOSIS — M542 Cervicalgia: Secondary | ICD-10-CM | POA: Diagnosis not present

## 2023-01-26 DIAGNOSIS — M9902 Segmental and somatic dysfunction of thoracic region: Secondary | ICD-10-CM | POA: Diagnosis not present

## 2023-01-26 DIAGNOSIS — M9903 Segmental and somatic dysfunction of lumbar region: Secondary | ICD-10-CM | POA: Diagnosis not present

## 2023-01-26 DIAGNOSIS — M5431 Sciatica, right side: Secondary | ICD-10-CM | POA: Diagnosis not present

## 2023-01-26 DIAGNOSIS — M50322 Other cervical disc degeneration at C5-C6 level: Secondary | ICD-10-CM | POA: Diagnosis not present

## 2023-01-26 DIAGNOSIS — M791 Myalgia, unspecified site: Secondary | ICD-10-CM | POA: Diagnosis not present

## 2023-02-01 DIAGNOSIS — F4322 Adjustment disorder with anxiety: Secondary | ICD-10-CM | POA: Diagnosis not present

## 2023-02-02 DIAGNOSIS — M791 Myalgia, unspecified site: Secondary | ICD-10-CM | POA: Diagnosis not present

## 2023-02-02 DIAGNOSIS — M5137 Other intervertebral disc degeneration, lumbosacral region with discogenic back pain only: Secondary | ICD-10-CM | POA: Diagnosis not present

## 2023-02-02 DIAGNOSIS — M542 Cervicalgia: Secondary | ICD-10-CM | POA: Diagnosis not present

## 2023-02-02 DIAGNOSIS — M50322 Other cervical disc degeneration at C5-C6 level: Secondary | ICD-10-CM | POA: Diagnosis not present

## 2023-02-02 DIAGNOSIS — M9903 Segmental and somatic dysfunction of lumbar region: Secondary | ICD-10-CM | POA: Diagnosis not present

## 2023-02-02 DIAGNOSIS — M5431 Sciatica, right side: Secondary | ICD-10-CM | POA: Diagnosis not present

## 2023-02-02 DIAGNOSIS — M9902 Segmental and somatic dysfunction of thoracic region: Secondary | ICD-10-CM | POA: Diagnosis not present

## 2023-02-02 DIAGNOSIS — M9901 Segmental and somatic dysfunction of cervical region: Secondary | ICD-10-CM | POA: Diagnosis not present

## 2023-02-08 DIAGNOSIS — F4322 Adjustment disorder with anxiety: Secondary | ICD-10-CM | POA: Diagnosis not present

## 2023-02-16 DIAGNOSIS — M50322 Other cervical disc degeneration at C5-C6 level: Secondary | ICD-10-CM | POA: Diagnosis not present

## 2023-02-16 DIAGNOSIS — M9902 Segmental and somatic dysfunction of thoracic region: Secondary | ICD-10-CM | POA: Diagnosis not present

## 2023-02-16 DIAGNOSIS — M791 Myalgia, unspecified site: Secondary | ICD-10-CM | POA: Diagnosis not present

## 2023-02-16 DIAGNOSIS — M9901 Segmental and somatic dysfunction of cervical region: Secondary | ICD-10-CM | POA: Diagnosis not present

## 2023-02-16 DIAGNOSIS — M5431 Sciatica, right side: Secondary | ICD-10-CM | POA: Diagnosis not present

## 2023-02-16 DIAGNOSIS — M9903 Segmental and somatic dysfunction of lumbar region: Secondary | ICD-10-CM | POA: Diagnosis not present

## 2023-02-16 DIAGNOSIS — M542 Cervicalgia: Secondary | ICD-10-CM | POA: Diagnosis not present

## 2023-02-16 DIAGNOSIS — M5137 Other intervertebral disc degeneration, lumbosacral region with discogenic back pain only: Secondary | ICD-10-CM | POA: Diagnosis not present

## 2023-02-22 DIAGNOSIS — F4322 Adjustment disorder with anxiety: Secondary | ICD-10-CM | POA: Diagnosis not present

## 2023-02-23 DIAGNOSIS — M791 Myalgia, unspecified site: Secondary | ICD-10-CM | POA: Diagnosis not present

## 2023-02-23 DIAGNOSIS — M9901 Segmental and somatic dysfunction of cervical region: Secondary | ICD-10-CM | POA: Diagnosis not present

## 2023-02-23 DIAGNOSIS — M9902 Segmental and somatic dysfunction of thoracic region: Secondary | ICD-10-CM | POA: Diagnosis not present

## 2023-02-23 DIAGNOSIS — M5137 Other intervertebral disc degeneration, lumbosacral region with discogenic back pain only: Secondary | ICD-10-CM | POA: Diagnosis not present

## 2023-02-23 DIAGNOSIS — M5431 Sciatica, right side: Secondary | ICD-10-CM | POA: Diagnosis not present

## 2023-02-23 DIAGNOSIS — M9903 Segmental and somatic dysfunction of lumbar region: Secondary | ICD-10-CM | POA: Diagnosis not present

## 2023-02-23 DIAGNOSIS — M50322 Other cervical disc degeneration at C5-C6 level: Secondary | ICD-10-CM | POA: Diagnosis not present

## 2023-02-23 DIAGNOSIS — M542 Cervicalgia: Secondary | ICD-10-CM | POA: Diagnosis not present

## 2023-03-01 DIAGNOSIS — F4322 Adjustment disorder with anxiety: Secondary | ICD-10-CM | POA: Diagnosis not present

## 2023-03-02 DIAGNOSIS — M9901 Segmental and somatic dysfunction of cervical region: Secondary | ICD-10-CM | POA: Diagnosis not present

## 2023-03-02 DIAGNOSIS — M9903 Segmental and somatic dysfunction of lumbar region: Secondary | ICD-10-CM | POA: Diagnosis not present

## 2023-03-02 DIAGNOSIS — M5431 Sciatica, right side: Secondary | ICD-10-CM | POA: Diagnosis not present

## 2023-03-02 DIAGNOSIS — M791 Myalgia, unspecified site: Secondary | ICD-10-CM | POA: Diagnosis not present

## 2023-03-02 DIAGNOSIS — M5137 Other intervertebral disc degeneration, lumbosacral region with discogenic back pain only: Secondary | ICD-10-CM | POA: Diagnosis not present

## 2023-03-02 DIAGNOSIS — M9902 Segmental and somatic dysfunction of thoracic region: Secondary | ICD-10-CM | POA: Diagnosis not present

## 2023-03-02 DIAGNOSIS — M50322 Other cervical disc degeneration at C5-C6 level: Secondary | ICD-10-CM | POA: Diagnosis not present

## 2023-03-02 DIAGNOSIS — M542 Cervicalgia: Secondary | ICD-10-CM | POA: Diagnosis not present

## 2023-03-04 DIAGNOSIS — M9902 Segmental and somatic dysfunction of thoracic region: Secondary | ICD-10-CM | POA: Diagnosis not present

## 2023-03-04 DIAGNOSIS — M5431 Sciatica, right side: Secondary | ICD-10-CM | POA: Diagnosis not present

## 2023-03-04 DIAGNOSIS — M9903 Segmental and somatic dysfunction of lumbar region: Secondary | ICD-10-CM | POA: Diagnosis not present

## 2023-03-04 DIAGNOSIS — M542 Cervicalgia: Secondary | ICD-10-CM | POA: Diagnosis not present

## 2023-03-04 DIAGNOSIS — M791 Myalgia, unspecified site: Secondary | ICD-10-CM | POA: Diagnosis not present

## 2023-03-04 DIAGNOSIS — M50322 Other cervical disc degeneration at C5-C6 level: Secondary | ICD-10-CM | POA: Diagnosis not present

## 2023-03-04 DIAGNOSIS — M9901 Segmental and somatic dysfunction of cervical region: Secondary | ICD-10-CM | POA: Diagnosis not present

## 2023-03-04 DIAGNOSIS — M5137 Other intervertebral disc degeneration, lumbosacral region with discogenic back pain only: Secondary | ICD-10-CM | POA: Diagnosis not present

## 2023-03-14 DIAGNOSIS — M9903 Segmental and somatic dysfunction of lumbar region: Secondary | ICD-10-CM | POA: Diagnosis not present

## 2023-03-14 DIAGNOSIS — M791 Myalgia, unspecified site: Secondary | ICD-10-CM | POA: Diagnosis not present

## 2023-03-14 DIAGNOSIS — M542 Cervicalgia: Secondary | ICD-10-CM | POA: Diagnosis not present

## 2023-03-14 DIAGNOSIS — M5137 Other intervertebral disc degeneration, lumbosacral region with discogenic back pain only: Secondary | ICD-10-CM | POA: Diagnosis not present

## 2023-03-14 DIAGNOSIS — M9901 Segmental and somatic dysfunction of cervical region: Secondary | ICD-10-CM | POA: Diagnosis not present

## 2023-03-14 DIAGNOSIS — M50322 Other cervical disc degeneration at C5-C6 level: Secondary | ICD-10-CM | POA: Diagnosis not present

## 2023-03-14 DIAGNOSIS — M9902 Segmental and somatic dysfunction of thoracic region: Secondary | ICD-10-CM | POA: Diagnosis not present

## 2023-03-14 DIAGNOSIS — M5431 Sciatica, right side: Secondary | ICD-10-CM | POA: Diagnosis not present

## 2023-03-22 ENCOUNTER — Telehealth: Payer: Self-pay | Admitting: Internal Medicine

## 2023-03-22 DIAGNOSIS — F4322 Adjustment disorder with anxiety: Secondary | ICD-10-CM | POA: Diagnosis not present

## 2023-03-22 NOTE — Telephone Encounter (Signed)
 Called pt to sch physical, left vm. Pls sch pt for appt.

## 2023-03-29 DIAGNOSIS — F4322 Adjustment disorder with anxiety: Secondary | ICD-10-CM | POA: Diagnosis not present

## 2023-04-05 DIAGNOSIS — F4322 Adjustment disorder with anxiety: Secondary | ICD-10-CM | POA: Diagnosis not present

## 2023-04-12 DIAGNOSIS — F4322 Adjustment disorder with anxiety: Secondary | ICD-10-CM | POA: Diagnosis not present

## 2023-04-19 DIAGNOSIS — F4322 Adjustment disorder with anxiety: Secondary | ICD-10-CM | POA: Diagnosis not present

## 2023-04-26 DIAGNOSIS — F4322 Adjustment disorder with anxiety: Secondary | ICD-10-CM | POA: Diagnosis not present

## 2023-05-10 DIAGNOSIS — N950 Postmenopausal bleeding: Secondary | ICD-10-CM | POA: Diagnosis not present

## 2023-05-10 DIAGNOSIS — N6019 Diffuse cystic mastopathy of unspecified breast: Secondary | ICD-10-CM | POA: Insufficient documentation

## 2023-05-14 DIAGNOSIS — Z8542 Personal history of malignant neoplasm of other parts of uterus: Secondary | ICD-10-CM | POA: Insufficient documentation

## 2023-06-03 ENCOUNTER — Telehealth: Payer: Self-pay

## 2023-06-03 DIAGNOSIS — Z131 Encounter for screening for diabetes mellitus: Secondary | ICD-10-CM | POA: Diagnosis not present

## 2023-06-03 DIAGNOSIS — E785 Hyperlipidemia, unspecified: Secondary | ICD-10-CM | POA: Diagnosis not present

## 2023-06-03 DIAGNOSIS — C541 Malignant neoplasm of endometrium: Secondary | ICD-10-CM | POA: Diagnosis not present

## 2023-06-03 DIAGNOSIS — I639 Cerebral infarction, unspecified: Secondary | ICD-10-CM | POA: Diagnosis not present

## 2023-06-03 DIAGNOSIS — E063 Autoimmune thyroiditis: Secondary | ICD-10-CM | POA: Diagnosis not present

## 2023-06-03 DIAGNOSIS — I10 Essential (primary) hypertension: Secondary | ICD-10-CM | POA: Diagnosis not present

## 2023-06-03 DIAGNOSIS — G459 Transient cerebral ischemic attack, unspecified: Secondary | ICD-10-CM | POA: Diagnosis not present

## 2023-06-03 DIAGNOSIS — Z6830 Body mass index (BMI) 30.0-30.9, adult: Secondary | ICD-10-CM | POA: Diagnosis not present

## 2023-06-03 DIAGNOSIS — I219 Acute myocardial infarction, unspecified: Secondary | ICD-10-CM | POA: Diagnosis not present

## 2023-06-03 DIAGNOSIS — Z01818 Encounter for other preprocedural examination: Secondary | ICD-10-CM | POA: Diagnosis not present

## 2023-06-03 NOTE — Telephone Encounter (Signed)
   Pre-operative Risk Assessment    Patient Name: [name]  DOB: 1950/12/16 MRN: [id number]   Date of last office visit: 09/08/22 Rollene Rotunda, MD Date of next office visit: NONE   Request for Surgical Clearance    Procedure:   ROBOTIC ASSISTED TOTAL HYSTERECTOMY, BILATERAL SALPINGO OOPHORECTOMY, BILATERAL SENTINEL LYMPH NODE MAP & BIOPSY  Date of Surgery:  Clearance 06/29/23                                Surgeon:  DR Adolphus Birchwood Surgeon's Group or Practice Name:  [facility] Phone number:  [phone] Fax number:  [phone]   Type of Clearance Requested:   - Medical  - Pharmacy:  Hold Aspirin     Type of Anesthesia:  Not Indicated   Additional requests/questions:    SignedMarlow Gordon   06/03/2023, 12:57 PM

## 2023-06-03 NOTE — Telephone Encounter (Signed)
 Tried to call the pt to scheduled tele preop appt. No answer.

## 2023-06-03 NOTE — Telephone Encounter (Signed)
   Name: [name]  DOB: 08-14-1950  MRN: [id number]  Primary Cardiologist: Rollene Rotunda, MD   Preoperative team, please contact this patient and set up a phone call appointment for further preoperative risk assessment. Please obtain consent and complete medication review. Thank you for your help.  I confirm that guidance regarding antiplatelet and oral anticoagulation therapy has been completed and, if necessary, noted below.  Pt takes Aspirin for a history of CVA. Cardiology does not manage patient's Aspirin. Recommendations for holding Aspirin prior to procedure should come from managing provider (PCP or neurology).   I also confirmed the patient resides in the state of [place]. As per [facility] Medical Board telemedicine laws, the patient must reside in the state in which the provider is licensed.   Joylene Grapes, NP 06/03/2023, 1:32 PM [facility]

## 2023-06-06 ENCOUNTER — Other Ambulatory Visit: Payer: Self-pay | Admitting: Cardiology

## 2023-06-06 NOTE — Telephone Encounter (Signed)
 2nd attempt to reach pt regarding surgical clearance and the need for an TELE appointment.  Left pt a detailed message to call back and get that scheduled.

## 2023-06-07 ENCOUNTER — Other Ambulatory Visit (HOSPITAL_COMMUNITY): Payer: Self-pay

## 2023-06-07 ENCOUNTER — Telehealth: Payer: Self-pay | Admitting: Pharmacy Technician

## 2023-06-07 NOTE — Telephone Encounter (Signed)
 Pharmacy Patient Advocate Encounter   Received notification from CoverMyMeds that prior authorization for Bystolic brand is required/requested.   Insurance verification completed.   The patient is insured through [employer] .   Per test claim: PA required; PA submitted to above mentioned insurance via CoverMyMeds Key/confirmation #/EOC [id number] Status is pending

## 2023-06-08 NOTE — Telephone Encounter (Signed)
 Pharmacy Patient Advocate Encounter  Received notification from [employer] that Prior Authorization for Bystolic has been DENIED.  Full denial letter will be uploaded to the media tab. See denial reason below.   PA #/Case ID/Reference #: [id number]

## 2023-06-09 ENCOUNTER — Encounter (INDEPENDENT_AMBULATORY_CARE_PROVIDER_SITE_OTHER): Admitting: Family Medicine

## 2023-06-09 NOTE — Telephone Encounter (Signed)
 Pt returning call in regards to clearance. Please advise

## 2023-06-09 NOTE — Progress Notes (Signed)
 Error

## 2023-06-10 ENCOUNTER — Encounter

## 2023-06-13 DIAGNOSIS — C541 Malignant neoplasm of endometrium: Secondary | ICD-10-CM | POA: Diagnosis not present

## 2023-06-13 NOTE — Telephone Encounter (Signed)
 3rd  attempt to reach pt regarding tele visit.  Left another message to call back and ask for the preop team.

## 2023-06-14 ENCOUNTER — Telehealth: Payer: Self-pay | Admitting: *Deleted

## 2023-06-14 NOTE — Telephone Encounter (Signed)
 S/w the pt and we have reviewed her medications for the tele appt tomorrow.  Med rec and consent are done.      Patient Consent for Virtual Visit        [name] has provided verbal consent on 06/14/2023 for a virtual visit (video or telephone).   CONSENT FOR VIRTUAL VISIT FOR:  [name]  By participating in this virtual visit I agree to the following:  I hereby voluntarily request, consent and authorize [facility] and its employed or contracted physicians, physician assistants, nurse practitioners or other licensed health care professionals (the Practitioner), to provide me with telemedicine health care services (the "Services") as deemed necessary by the treating Practitioner. I acknowledge and consent to receive the Services by the Practitioner via telemedicine. I understand that the telemedicine visit will involve communicating with the Practitioner through live audiovisual communication technology and the disclosure of certain medical information by electronic transmission. I acknowledge that I have been given the opportunity to request an in-person assessment or other available alternative prior to the telemedicine visit and am voluntarily participating in the telemedicine visit.  I understand that I have the right to withhold or withdraw my consent to the use of telemedicine in the course of my care at any time, without affecting my right to future care or treatment, and that the Practitioner or I may terminate the telemedicine visit at any time. I understand that I have the right to inspect all information obtained and/or recorded in the course of the telemedicine visit and may receive copies of available information for a reasonable fee.  I understand that some of the potential risks of receiving the Services via telemedicine include:  Delay or interruption in medical evaluation due to technological equipment failure or disruption; Information  transmitted may not be sufficient (e.g. poor resolution of images) to allow for appropriate medical decision making by the Practitioner; and/or  In rare instances, security protocols could fail, causing a breach of personal health information.  Furthermore, I acknowledge that it is my responsibility to provide information about my medical history, conditions and care that is complete and accurate to the best of my ability. I acknowledge that Practitioner's advice, recommendations, and/or decision may be based on factors not within their control, such as incomplete or inaccurate data provided by me or distortions of diagnostic images or specimens that may result from electronic transmissions. I understand that the practice of medicine is not an exact science and that Practitioner makes no warranties or guarantees regarding treatment outcomes. I acknowledge that a copy of this consent can be made available to me via my patient portal [facility] MyChart), or I can request a printed copy by calling the office of  [facility].    I understand that my insurance will be billed for this visit.   I have read or had this consent read to me. I understand the contents of this consent, which adequately explains the benefits and risks of the Services being provided via telemedicine.  I have been provided ample opportunity to ask questions regarding this consent and the Services and have had my questions answered to my satisfaction. I give my informed consent for the services to be provided through the use of telemedicine in my medical care

## 2023-06-14 NOTE — Telephone Encounter (Signed)
 Pt has tele appt 06/15/23

## 2023-06-15 ENCOUNTER — Ambulatory Visit: Attending: Cardiovascular Disease | Admitting: Student

## 2023-06-15 DIAGNOSIS — C541 Malignant neoplasm of endometrium: Secondary | ICD-10-CM | POA: Diagnosis not present

## 2023-06-15 DIAGNOSIS — E063 Autoimmune thyroiditis: Secondary | ICD-10-CM | POA: Diagnosis not present

## 2023-06-15 DIAGNOSIS — I2510 Atherosclerotic heart disease of native coronary artery without angina pectoris: Secondary | ICD-10-CM | POA: Diagnosis not present

## 2023-06-15 DIAGNOSIS — I10 Essential (primary) hypertension: Secondary | ICD-10-CM | POA: Diagnosis not present

## 2023-06-15 DIAGNOSIS — I7121 Aneurysm of the ascending aorta, without rupture: Secondary | ICD-10-CM | POA: Diagnosis not present

## 2023-06-15 DIAGNOSIS — I252 Old myocardial infarction: Secondary | ICD-10-CM | POA: Diagnosis not present

## 2023-06-15 DIAGNOSIS — Z8673 Personal history of transient ischemic attack (TIA), and cerebral infarction without residual deficits: Secondary | ICD-10-CM | POA: Diagnosis not present

## 2023-06-15 DIAGNOSIS — Z01810 Encounter for preprocedural cardiovascular examination: Secondary | ICD-10-CM | POA: Diagnosis not present

## 2023-06-15 DIAGNOSIS — R109 Unspecified abdominal pain: Secondary | ICD-10-CM | POA: Diagnosis not present

## 2023-06-15 DIAGNOSIS — I447 Left bundle-branch block, unspecified: Secondary | ICD-10-CM | POA: Diagnosis not present

## 2023-06-15 NOTE — Progress Notes (Signed)
 Virtual Visit via Telephone Note   Because of Lidya Brewington-Glenn's co-morbid illnesses, she is at least at moderate risk for complications without adequate follow up.  This format is felt to be most appropriate for this patient at this time.  The patient did not have access to video technology/had technical difficulties with video requiring transitioning to audio format only (telephone).  All issues noted in this document were discussed and addressed.  No physical exam could be performed with this format.  Please refer to the patient's chart for her consent to telehealth for [facility].  Evaluation Performed:  Preoperative cardiovascular risk assessment _____________   Date:  06/15/2023   Patient ID:  Jay Kempe, DOB 12/03/1950, MRN [id number] Patient Location:  Home Provider location:   Office  Primary Care Provider:  Madelin Headings, MD Primary Cardiologist:  Rollene Rotunda, MD  Chief Complaint / Patient Profile   73 y.o. y/o female with a h/o LBBB, ascending aortic aneurysm, carotid artery disease, hypertension, hyperlipidemia, TIA, CVA who is pending robotic assisted total hysterectomy, bilateral salpingo-oophorectomy, bilateral sentinel lymph node mapping and biopsy by Dr. Andrey Farmer on 06/29/2023 and presents today for telephonic preoperative cardiovascular risk assessment.  History of Present Illness    Sunshyne Horvath is a 73 y.o. female who presents via audio/video conferencing for a telehealth visit today.  Pt was last seen in cardiology clinic on 09/08/2022 by Dr. Antoine Poche.  At that time Emiliya Chretien was stable from a cardiac standpoint.  The patient is now pending procedure as outlined above. Since her last visit, she is doing well. Patient denies shortness of breath, lower extremity edema, orthopnea or PND. No chest pain, pressure, or tightness. No palpitations.  She reports some dyspnea with heavier exertion that she attributes to inconsistent  exercising. She has been trying to get back into more consistent exercise. She walks outside in her backyard and around her home, performs stretches and sit-ups, and is able to perform light to moderate household activities including washing dishes and vacuuming.   Past Medical History    Past Medical History:  Diagnosis Date   Arthritis    Chicken pox    Depression    Diverticulitis    Elevated urinary free cortisol level 03/25/2014   mild with low potasssium  endo consult   disc with patient     Hypertension    Sciatica    Seasonal allergies    Stroke [facility]) 09/2008   Thyroid disease    Past Surgical History:  Procedure Laterality Date   MINOR BREAST BIOPSY  1979   WISDOM TOOTH EXTRACTION      Allergies  Allergies  Allergen Reactions   Isovue [Iopamidol] Itching    Pt states she had itching after CT in 2015.   Erythromycin Base    Penicillins Swelling    Has patient had a PCN reaction causing immediate rash, facial/tongue/throat swelling, SOB or lightheadedness with hypotension: yes Has patient had a PCN reaction causing severe rash involving mucus membranes or skin necrosis: no Has patient had a PCN reaction that required hospitalization no Has patient had a PCN reaction occurring within the last 10 years: no If all of the above answers are "NO", then may proceed with Cephalosporin use.    Sulfa Antibiotics Itching    Home Medications    Prior to Admission medications   Medication Sig Start Date End Date Taking? Authorizing Provider  Ascorbic Acid (VITAMIN C) 1000 MG tablet Take 1,000 mg by mouth every morning.  [provider]  ASHWAGANDHA PO Take 1 capsule by mouth daily.    [provider]  aspirin 81 MG chewable tablet Chew 1 tablet (81 mg total) by mouth daily. Patient not taking: Reported on 06/14/2023 09/07/19   Burnadette Pop, MD  B Complex-C (B-COMPLEX WITH VITAMIN C) tablet Take 1 tablet by mouth daily.    [provider]   BYSTOLIC 20 MG TABS TAKE ONE TABLET BY MOUTH ONE TIME DAILY 06/06/23   Rollene Rotunda, MD  Cholecalciferol (VITAMIN D PO) Take 5,000 Units by mouth daily.     [provider]  cloNIDine (CATAPRES) 0.1 MG tablet Take 1/2 tablet daily as needed Patient not taking: Reported on 06/14/2023 11/18/21   Rollene Rotunda, MD  co-enzyme Q-10 30 MG capsule Take 30 mg by mouth daily.    [provider]  hydrALAZINE (APRESOLINE) 50 MG tablet Take 50 mg twice a day Patient not taking: Reported on 06/14/2023 11/18/21   Rollene Rotunda, MD  Moringa Oleifera (MORINGA PO) Take 1 packet by mouth daily.     [provider]  MULTIPLE VITAMIN PO Take 1 tablet by mouth daily.     [provider]  NATTOKINASE PO Take 1 tablet by mouth every evening. Helps blood flow    [provider]  NON FORMULARY Chlorella:  Takes 15 capsules daily    [provider]  NON FORMULARY Super Beet powder that she adds to her smoothie    [provider]  OVER THE COUNTER MEDICATION Take 1 packet by mouth daily. Mushroom supplement    [provider]  Probiotic Product (PROBIOTIC PO) Take 1 tablet by mouth daily. Bifido Beadlet 50+    [provider]  SPIRULINA PO Take 1 teaspoon by mouth daily    [provider]    Physical Exam    Vital Signs:  Jalexia Brewington-Glenn does not have vital signs available for review today.  Given telephonic nature of communication, physical exam is limited. AAOx3. NAD. Normal affect.  Speech and respirations are unlabored.   Assessment & Plan    Primary Cardiologist: Rollene Rotunda, MD  Preoperative cardiovascular risk assessment.  Robotic assisted total hysterectomy, bilateral salpingo-oophorectomy, bilateral sentinel lymph node mapping and biopsy by Dr. Andrey Farmer on 06/29/2023.  Chart reviewed as part of pre-operative protocol coverage. According to the RCRI, patient has a 0.9% risk of MACE. Patient reports activity  equivalent to >4.0 METS (walking, stretching, sit-ups, light to moderate household activities).   Given past medical history and time since last visit, based on ACC/AHA guidelines, Daira Hine would be at acceptable risk for the planned procedure without further cardiovascular testing.   Patient was advised that if she develops new symptoms prior to surgery to contact our office to arrange a follow-up appointment.  she verbalized understanding.  Aspirin prescribed by a noncardiology provider (neurology) therefore recommendations for holding deferred to prescribing provider.    I will route this recommendation to the requesting party via Epic fax function.  Please call with questions.  Time:   Today, I have spent 12 minutes with the patient with telehealth technology discussing medical history, symptoms, and management plan.     Carlos Levering, NP  06/15/2023, 7:34 AM

## 2023-07-05 DIAGNOSIS — K5720 Diverticulitis of large intestine with perforation and abscess without bleeding: Secondary | ICD-10-CM | POA: Diagnosis not present

## 2023-07-05 DIAGNOSIS — Z170 Estrogen receptor positive status [ER+]: Secondary | ICD-10-CM | POA: Diagnosis not present

## 2023-07-05 DIAGNOSIS — Z79899 Other long term (current) drug therapy: Secondary | ICD-10-CM | POA: Diagnosis not present

## 2023-07-05 DIAGNOSIS — C775 Secondary and unspecified malignant neoplasm of intrapelvic lymph nodes: Secondary | ICD-10-CM | POA: Diagnosis not present

## 2023-07-05 DIAGNOSIS — E063 Autoimmune thyroiditis: Secondary | ICD-10-CM | POA: Diagnosis not present

## 2023-07-05 DIAGNOSIS — I10 Essential (primary) hypertension: Secondary | ICD-10-CM | POA: Diagnosis not present

## 2023-07-05 DIAGNOSIS — K5730 Diverticulosis of large intestine without perforation or abscess without bleeding: Secondary | ICD-10-CM | POA: Diagnosis not present

## 2023-07-05 DIAGNOSIS — Z8542 Personal history of malignant neoplasm of other parts of uterus: Secondary | ICD-10-CM | POA: Diagnosis not present

## 2023-07-05 DIAGNOSIS — E785 Hyperlipidemia, unspecified: Secondary | ICD-10-CM | POA: Diagnosis not present

## 2023-07-05 DIAGNOSIS — I2510 Atherosclerotic heart disease of native coronary artery without angina pectoris: Secondary | ICD-10-CM | POA: Diagnosis not present

## 2023-07-05 DIAGNOSIS — C779 Secondary and unspecified malignant neoplasm of lymph node, unspecified: Secondary | ICD-10-CM | POA: Diagnosis not present

## 2023-07-05 DIAGNOSIS — Z8673 Personal history of transient ischemic attack (TIA), and cerebral infarction without residual deficits: Secondary | ICD-10-CM | POA: Diagnosis not present

## 2023-07-05 DIAGNOSIS — D259 Leiomyoma of uterus, unspecified: Secondary | ICD-10-CM | POA: Diagnosis not present

## 2023-07-05 DIAGNOSIS — I252 Old myocardial infarction: Secondary | ICD-10-CM | POA: Diagnosis not present

## 2023-07-05 DIAGNOSIS — C541 Malignant neoplasm of endometrium: Secondary | ICD-10-CM | POA: Diagnosis not present

## 2023-07-05 DIAGNOSIS — C548 Malignant neoplasm of overlapping sites of corpus uteri: Secondary | ICD-10-CM | POA: Diagnosis not present

## 2023-07-05 DIAGNOSIS — C786 Secondary malignant neoplasm of retroperitoneum and peritoneum: Secondary | ICD-10-CM | POA: Diagnosis not present

## 2023-07-05 DIAGNOSIS — N736 Female pelvic peritoneal adhesions (postinfective): Secondary | ICD-10-CM | POA: Diagnosis not present

## 2023-07-18 ENCOUNTER — Encounter (HOSPITAL_BASED_OUTPATIENT_CLINIC_OR_DEPARTMENT_OTHER): Payer: Self-pay

## 2023-07-18 NOTE — Progress Notes (Unsigned)
 Cardiology Clinic Note   Patient Name: [name] Date of Encounter: 07/19/2023  Primary Care Provider:  No primary care provider on file. Primary Cardiologist:  Eilleen Grates, MD  Patient Profile    [name] 73 year old female presents to the clinic today for evaluation of her hypertension.  Past Medical History    Past Medical History:  Diagnosis Date   Arthritis    Chicken pox    Depression    Diverticulitis    Elevated urinary free cortisol level 03/25/2014   mild with low potasssium  endo consult   disc with patient     Hypertension    Sciatica    Seasonal allergies    Stroke [facility]) 09/2008   Thyroid  disease    Past Surgical History:  Procedure Laterality Date   MINOR BREAST BIOPSY  1979   WISDOM TOOTH EXTRACTION      Allergies  Allergies  Allergen Reactions   Isovue [Iopamidol] Itching    Pt states she had itching after CT in 2015.   Erythromycin Base    Penicillins Swelling    Has patient had a PCN reaction causing immediate rash, facial/tongue/throat swelling, SOB or lightheadedness with hypotension: yes Has patient had a PCN reaction causing severe rash involving mucus membranes or skin necrosis: no Has patient had a PCN reaction that required hospitalization no Has patient had a PCN reaction occurring within the last 10 years: no If all of the above answers are "NO", then may proceed with Cephalosporin use.    Sulfa Antibiotics Itching    History of Present Illness    [name] has PMH of hypertension, LBBB, PVCs, CAD, TIA, and CVA.  She has multiple medication intolerances.  She has not been able to tolerate carvedilol , amlodipine , spironolactone , or hydralazine .  She does tolerate brand-name Bystolic .  She was seen in follow-up by Dr. Yancy Heller and on 09/08/2022.  Her main complaint during that visit was related to her sciatica.  Her blood pressure at that time was noted to be 160/100.  She reported that her blood  pressure at home was in the 140s.  She was instructed to take her as needed clonidine .  Her EKG showed sinus rhythm with first-degree AV block.  Therapeutic lifestyle changes were discussed.  She was seen by her PCP who asked her to follow-up with cardiology for further management and evaluation of her blood pressure.  She presents to the clinic today for follow-up evaluation and states she is recovering from her recent hysterectomy.  She has not been very active.  She notes that she has not been outside very much since the pandemic.  We reviewed her previous cardiology visit and her blood pressure medications.  She reports that she is only able to take brand-name Bystolic .  She has been having this filled at [facility] and been pain for brand-name.  Initially her blood pressure today is 188/104 and on recheck it is 158/98.  We reviewed recommendations for taking as needed clonidine .  I also offered to prescribe ARB for her.  She wishes to defer at this time.  I recommended follow-up with advanced hypertension clinic.  I will give her the mindfulness stress reduction sheet refill her Bystolic  and clonidine  and plan follow-up in around 3 months.  Today she denies chest pain, shortness of breath, lower extremity edema, fatigue, palpitations, melena, hematuria, hemoptysis, diaphoresis, weakness, presyncope, syncope, orthopnea, and PND.    Home Medications    Prior to Admission medications   Medication Sig  Start Date End Date Taking? Authorizing Provider  Ascorbic Acid (VITAMIN C) 1000 MG tablet Take 1,000 mg by mouth every morning.    [provider]  ASHWAGANDHA PO Take 1 capsule by mouth daily.    [provider]  aspirin  81 MG chewable tablet Chew 1 tablet (81 mg total) by mouth daily. Patient not taking: Reported on 06/14/2023 09/07/19   Adhikari, Amrit, MD  B Complex-C (B-COMPLEX WITH VITAMIN C) tablet Take 1 tablet by mouth daily.    [provider]  BYSTOLIC  20 MG TABS TAKE  ONE TABLET BY MOUTH ONE TIME DAILY 06/06/23   Eilleen Grates, MD  Cholecalciferol (VITAMIN D PO) Take 5,000 Units by mouth daily.     [provider]  cloNIDine  (CATAPRES ) 0.1 MG tablet Take 1/2 tablet daily as needed Patient not taking: Reported on 06/14/2023 11/18/21   Eilleen Grates, MD  co-enzyme Q-10 30 MG capsule Take 30 mg by mouth daily.    [provider]  hydrALAZINE  (APRESOLINE ) 50 MG tablet Take 50 mg twice a day Patient not taking: Reported on 06/14/2023 11/18/21   Eilleen Grates, MD  Moringa Oleifera (MORINGA PO) Take 1 packet by mouth daily.     [provider]  MULTIPLE VITAMIN PO Take 1 tablet by mouth daily.     [provider]  NATTOKINASE PO Take 1 tablet by mouth every evening. Helps blood flow    [provider]  NON FORMULARY Chlorella:  Takes 15 capsules daily    [provider]  NON FORMULARY Super Beet powder that she adds to her smoothie    [provider]  OVER THE COUNTER MEDICATION Take 1 packet by mouth daily. Mushroom supplement    [provider]  Probiotic Product (PROBIOTIC PO) Take 1 tablet by mouth daily. Bifido Beadlet 50+    [provider]  SPIRULINA PO Take 1 teaspoon by mouth daily    [provider]    Family History    Family History  Problem Relation Age of Onset   Heart failure Mother    Heart disease Mother    Diabetes Mother    Hypertension Mother    Hypertension Father    Diabetes Maternal Grandmother    Hypertension Sister    Hypertension Brother    Cancer Brother        Prostate   Hypertension Sister    She indicated that her mother is deceased. She indicated that her father is alive. She indicated that the status of her brother is unknown. She indicated that her maternal grandmother is deceased. She indicated that her maternal grandfather is deceased. She indicated that her paternal grandmother is deceased. She indicated that her paternal grandfather  is deceased.  Social History    Social History   Socioeconomic History   Marital status: Widowed    Spouse name: Not on file   Number of children: Not on file   Years of education: Not on file   Highest education level: Not on file  Occupational History   Not on file  Tobacco Use   Smoking status: Never   Smokeless tobacco: Never  Vaping Use   Vaping status: Not on file  Substance and Sexual Activity   Alcohol use: Yes    Comment: Social Use only   Drug use: No   Sexual activity: Not Currently  Other Topics Concern   Not on file  Social History Narrative   Usually sleeps 7 hours per night  Lives with her son 6 y no pets    No pets   BA degree in various grad work   Note about 10 years husband was in the [employer]   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 very much   g2p2   Neg tad herbals exercise       Right handed   Social Drivers of Corporate investment banker Strain: Low Risk  (05/31/2022)   Overall Financial Resource Strain (CARDIA)    Difficulty of Paying Living Expenses: Not hard at all  Food Insecurity: No Food Insecurity (05/31/2022)   Hunger Vital Sign    Worried About Running Out of Food in the Last Year: Never true    Ran Out of Food in the Last Year: Never true  Transportation Needs: No Transportation Needs (05/31/2022)   PRAPARE - Administrator, Civil Service (Medical): No    Lack of Transportation (Non-Medical): No  Physical Activity: Insufficiently Active (05/31/2022)   Exercise Vital Sign    Days of Exercise per Week: 2 days    Minutes of Exercise per Session: 10 min  Stress: No Stress Concern Present (05/31/2022)   [employer] of Occupational Health - Occupational Stress Questionnaire    Feeling of Stress : Not at all  Social Connections: Moderately Integrated (05/31/2022)   Social Connection and Isolation Panel [NHANES]    Frequency of Communication with Friends  and Family: More than three times a week    Frequency of Social Gatherings with Friends and Family: More than three times a week    Attends Religious Services: More than 4 times per year    Active Member of [employer] or Organizations: Yes    Attends Banker Meetings: More than 4 times per year    Marital Status: Widowed  Intimate Partner Violence: Not At Risk (05/31/2022)   Humiliation, Afraid, Rape, and Kick questionnaire    Fear of Current or Ex-Partner: No    Emotionally Abused: No    Physically Abused: No    Sexually Abused: No     Review of Systems    General:  No chills, fever, night sweats or weight changes.  Cardiovascular:  No chest pain, dyspnea on exertion, edema, orthopnea, palpitations, paroxysmal nocturnal dyspnea. Dermatological: No rash, lesions/masses Respiratory: No cough, dyspnea Urologic: No hematuria, dysuria Abdominal:   No nausea, vomiting, diarrhea, bright red blood per rectum, melena, or hematemesis Neurologic:  No visual changes, wkns, changes in mental status. All other systems reviewed and are otherwise negative except as noted above.  Physical Exam    VS:  BP (!) 158/98   Pulse 67   Ht 5\' 3"  (1.6 m)   Wt 175 lb (79.4 kg)   SpO2 96%   BMI 31.00 kg/m  , BMI Body mass index is 31 kg/m. GEN: Well nourished, well developed, in no acute distress. HEENT: normal. Neck: Supple, no JVD, carotid bruits, or masses. Cardiac: RRR, no murmurs, rubs, or gallops. No clubbing, cyanosis, edema.  Radials/DP/PT 2+ and equal bilaterally.  Respiratory:  Respirations regular and unlabored, clear to auscultation bilaterally. GI: Soft, nontender, nondistended, BS + x 4. MS: no deformity or atrophy. Skin: warm and dry, no rash. Neuro:  Strength and sensation are intact. Psych: Normal affect.  Accessory Clinical Findings    Recent Labs: No results found for requested labs within last 365 days.   Recent Lipid Panel    Component Value Date/Time  CHOL 166  02/20/2020 1206   TRIG 70.0 02/20/2020 1206   HDL 64.30 02/20/2020 1206   CHOLHDL 3 02/20/2020 1206   VLDL 14.0 02/20/2020 1206   LDLCALC 88 02/20/2020 1206    HYPERTENSION CONTROL Vitals:   07/19/23 0829 07/19/23 0937  BP: (!) 188/104 (!) 158/98    The patient's blood pressure is elevated above target today.  In order to address the patient's elevated BP: Blood pressure will be monitored at home to determine if medication changes need to be made.; A referral to the Advanced Hypertension Clinic will be placed.       ECG personally reviewed by me today- EKG Interpretation Date/Time:  Tuesday Jul 19 2023 08:35:15 EDT Ventricular Rate:  67 PR Interval:  216 QRS Duration:  162 QT Interval:  466 QTC Calculation: 492 R Axis:   -42  Text Interpretation: Sinus rhythm with 1st degree A-V block Left axis deviation Left bundle branch block Confirmed by Lawana Pray [id number]) on 07/19/2023 8:41:44 AM    MRA abdomen 03/30/2016   FINDINGS: ARTERIAL   Aorta: Minor wall irregularity from atherosclerosis. Tortuosity of the abdominal aorta noted. Negative for significant aneurysm or dissection. No retroperitoneal abnormality or hemorrhage.   Celiac axis:          Widely patent   Superior mesenteric:  Widely patent   Left renal:           Widely patent   Right renal:          Widely patent   Inferior mesenteric:  Widely patent   Left iliac: Tortuous left common, internal, and external iliac arteries but patent   Right iliac: Slightly tortuous right common, internal and external iliac arteries but remain patent   VENOUS   Hepatic, portal, mesenteric, and renal veins appear patent. IVC and iliac veins are also patent. No veno-occlusive process.   NONVASCULAR   Lower chest:  No acute findings.   Hepatobiliary: No masses or other significant abnormality.   Pancreas: No mass, inflammatory changes, or other significant abnormality.   Spleen: Within normal limits in size  and appearance.   Adrenals/Urinary Tract: No hydronephrosis. Scattered bilateral cortical renal cyst all measuring 14 mm or less in size. No renal obstruction or hydronephrosis.   Stomach/Bowel: No evidence of obstruction, inflammatory process, or abnormal fluid collections.   Lymphatic: No pathologically enlarged lymph nodes.   Reproductive: Not imaged   Other: None.   Musculoskeletal: No suspicious bone lesions identified.   IMPRESSION: Widely patent renal arteries.  Negative for renal artery stenosis.   Minor aortoiliac atherosclerosis     Electronically Signed   By: Melven Stable.  Shick M.D.   On: 03/30/2016 13:16    Assessment & Plan   1.  Essential hypertension-BP today 188/ 104 and on recheck 158/98.  Offered an ARB.  She wishes to defer at this time.  Will place referral to advanced hypertension clinic Maintain blood pressure log Heart healthy low-sodium diet Secondary causes of hypertension reviewed Continue Bystolic , clonidine  as needed-refill Encouraged to maintain blood pressure log.  Reviewed secondary causes of hypertension. Mindfulness stress reduction sheet given  LBBB-denies episodes of lightheadedness, presyncope or syncope.  EKG today shows sinus rhythm with first-degree AV block left axis deviation LBBB QTc 492.  History of CVA-neurologically intact. Continue aspirin  Follows with PCP  Ascending aortic dilation-of note.  She has contrast allergy.  MRI abdomen 03/30/2016 showed she was negative for significant aneurysm or dissection. Maintain good blood pressure control  Disposition: Follow-up with  Dr. Lavonne Prairie or me in 3 months.   Chet Cota. Surie Suchocki NP-C     07/19/2023, 9:38 AM [facility] [address] Office [phone] Fax [phone]    I spent 15 minutes examining this patient, reviewing medications, and using patient centered shared decision making involving their cardiac care.   I spent  20 minutes  reviewing past medical history,  medications, and prior cardiac tests.

## 2023-07-19 ENCOUNTER — Ambulatory Visit: Attending: General Practice | Admitting: General Practice

## 2023-07-19 ENCOUNTER — Encounter: Payer: Self-pay | Admitting: General Practice

## 2023-07-19 VITALS — BP 158/98 | HR 67 | Ht 63.0 in | Wt 175.0 lb

## 2023-07-19 DIAGNOSIS — I10 Essential (primary) hypertension: Secondary | ICD-10-CM | POA: Diagnosis not present

## 2023-07-19 DIAGNOSIS — I77810 Thoracic aortic ectasia: Secondary | ICD-10-CM | POA: Insufficient documentation

## 2023-07-19 DIAGNOSIS — I447 Left bundle-branch block, unspecified: Secondary | ICD-10-CM | POA: Diagnosis not present

## 2023-07-19 DIAGNOSIS — F4322 Adjustment disorder with anxiety: Secondary | ICD-10-CM | POA: Diagnosis not present

## 2023-07-19 MED ORDER — BYSTOLIC 20 MG PO TABS: 1.0000 | ORAL_TABLET | Freq: Every day | ORAL | 1 refills | Status: DC

## 2023-07-19 NOTE — Patient Instructions (Addendum)
 Medication Instructions:  YOUR REFILL HAS BEEN SENT TO [facility]. *If you need a refill on your cardiac medications before your next appointment, please call your pharmacy*   Lab Work: No labs were ordered during today's visit.  If you have labs (blood work) drawn today and your tests are completely normal, you will receive your results only by: MyChart Message (if you have MyChart) OR A paper copy in the mail If you have any lab test that is abnormal or we need to change your treatment, we will call you to review the results.    Follow-Up: At [facility], you and your health needs are our priority.  As part of our continuing mission to provide you with exceptional heart care, we have created designated Provider Care Teams.  These Care Teams include your primary Cardiologist (physician) and Advanced Practice Providers (APPs -  Physician Assistants and Nurse Practitioners) who all work together to provide you with the care you need, when you need it.  We recommend signing up for the patient portal called "MyChart".  Sign up information is provided on this After Visit Summary.  MyChart is used to connect with patients for Virtual Visits (Telemedicine).  Patients are able to view lab/test results, encounter notes, upcoming appointments, etc.  Non-urgent messages can be sent to your provider as well.   To learn more about what you can do with MyChart, go to [email or web address].    Your next appointment:   3 month(s)  Provider:   Eilleen Grates, MD OR Lawana Pray   Other Instructions [facility]:

## 2023-07-21 ENCOUNTER — Inpatient Hospital Stay: Admitting: Internal Medicine

## 2023-07-22 ENCOUNTER — Telehealth: Payer: Self-pay | Admitting: General Practice

## 2023-07-22 NOTE — Telephone Encounter (Signed)
 Pt c/o medication issue:  1. Name of Medication: Bystolic   2. How are you currently taking this medication (dosage and times per day)?   3. Are you having a reaction (difficulty breathing--STAT)?   4. What is your medication issue? Patient must have the Bystolic - she said they need the  authorization so patient can have Bystolic  only- she said she was told to tell the provider to call this number [phone] ask for Lavonia Powers and it is Option 2

## 2023-07-25 ENCOUNTER — Other Ambulatory Visit (HOSPITAL_COMMUNITY): Payer: Self-pay

## 2023-07-25 ENCOUNTER — Telehealth: Payer: Self-pay | Admitting: Pharmacy Technician

## 2023-07-25 NOTE — Telephone Encounter (Signed)
 Received notification from [employer] that Prior Authorization for BYSTOLIC  has been APPROVED from 07/25/23 to 03/14/2098    Per test claim it will be 150.00 for 90 days patient gets mail order. I called the patient to tell her about the approval.

## 2023-07-25 NOTE — Telephone Encounter (Signed)
 I [facility] the [facility] and [facility] and [facility] are filling her prescription now 150.00 for 90 days. Her prior auth until 2099. [phone] ph ins but mail order pharmacy is : [phone]

## 2023-07-25 NOTE — Telephone Encounter (Signed)
 Pharmacy Patient Advocate Encounter  Received notification from [employer] that Prior Authorization for BYSTOLIC  has been APPROVED from 07/25/23 to 03/14/2098   Per test claim it will be 150.00 for 90 days patient gets mail order. I called the patient to tell her about the approval.

## 2023-07-25 NOTE — Telephone Encounter (Signed)
 [facility] not [facility]  had to fill at [facility] 172.80 30 TABLETS

## 2023-07-26 DIAGNOSIS — F4322 Adjustment disorder with anxiety: Secondary | ICD-10-CM | POA: Diagnosis not present

## 2023-07-29 DIAGNOSIS — Z79899 Other long term (current) drug therapy: Secondary | ICD-10-CM | POA: Diagnosis not present

## 2023-07-29 DIAGNOSIS — C775 Secondary and unspecified malignant neoplasm of intrapelvic lymph nodes: Secondary | ICD-10-CM | POA: Diagnosis not present

## 2023-07-29 DIAGNOSIS — Z7189 Other specified counseling: Secondary | ICD-10-CM | POA: Diagnosis not present

## 2023-07-29 DIAGNOSIS — C541 Malignant neoplasm of endometrium: Secondary | ICD-10-CM | POA: Diagnosis not present

## 2023-07-29 DIAGNOSIS — Z5181 Encounter for therapeutic drug level monitoring: Secondary | ICD-10-CM | POA: Diagnosis not present

## 2023-08-02 DIAGNOSIS — F4322 Adjustment disorder with anxiety: Secondary | ICD-10-CM | POA: Diagnosis not present

## 2023-08-05 DIAGNOSIS — Z79899 Other long term (current) drug therapy: Secondary | ICD-10-CM | POA: Diagnosis not present

## 2023-08-05 DIAGNOSIS — Z5181 Encounter for therapeutic drug level monitoring: Secondary | ICD-10-CM | POA: Diagnosis not present

## 2023-08-05 DIAGNOSIS — C541 Malignant neoplasm of endometrium: Secondary | ICD-10-CM | POA: Diagnosis not present

## 2023-08-09 DIAGNOSIS — Z5112 Encounter for antineoplastic immunotherapy: Secondary | ICD-10-CM | POA: Diagnosis not present

## 2023-08-09 DIAGNOSIS — C541 Malignant neoplasm of endometrium: Secondary | ICD-10-CM | POA: Diagnosis not present

## 2023-08-09 DIAGNOSIS — Z79899 Other long term (current) drug therapy: Secondary | ICD-10-CM | POA: Diagnosis not present

## 2023-08-09 DIAGNOSIS — I10 Essential (primary) hypertension: Secondary | ICD-10-CM | POA: Diagnosis not present

## 2023-08-09 DIAGNOSIS — Z5111 Encounter for antineoplastic chemotherapy: Secondary | ICD-10-CM | POA: Diagnosis not present

## 2023-08-30 ENCOUNTER — Encounter (HOSPITAL_BASED_OUTPATIENT_CLINIC_OR_DEPARTMENT_OTHER): Payer: Self-pay

## 2023-09-01 ENCOUNTER — Other Ambulatory Visit (HOSPITAL_COMMUNITY): Payer: Self-pay

## 2023-09-01 ENCOUNTER — Other Ambulatory Visit (HOSPITAL_BASED_OUTPATIENT_CLINIC_OR_DEPARTMENT_OTHER): Payer: Self-pay

## 2023-09-01 ENCOUNTER — Encounter (HOSPITAL_BASED_OUTPATIENT_CLINIC_OR_DEPARTMENT_OTHER): Payer: Self-pay | Admitting: Family

## 2023-09-01 ENCOUNTER — Ambulatory Visit (HOSPITAL_BASED_OUTPATIENT_CLINIC_OR_DEPARTMENT_OTHER): Admitting: Family

## 2023-09-01 VITALS — BP 140/80 | HR 67 | Ht 64.0 in | Wt 171.0 lb

## 2023-09-01 DIAGNOSIS — I10 Essential (primary) hypertension: Secondary | ICD-10-CM

## 2023-09-01 DIAGNOSIS — I447 Left bundle-branch block, unspecified: Secondary | ICD-10-CM

## 2023-09-01 DIAGNOSIS — Z8673 Personal history of transient ischemic attack (TIA), and cerebral infarction without residual deficits: Secondary | ICD-10-CM

## 2023-09-01 NOTE — Progress Notes (Unsigned)
 Advanced Hypertension Clinic Initial Assessment:    Date:  09/01/2023   ID:  [name], DOB 1950/09/15, MRN [id number]  PCP:  No primary care provider on file.  Cardiologist:  Krystal Grates, MD  Nephrologist:  Referring MD: Krystal Charity, NP   CC: Hypertension  History of Present Illness:    [name] is a 73 y.o. female with a hx of hypertension, LBBB, PVC, CAD, TIA, CVA here to establish care in the Advanced Hypertension Clinic.   Multiple medication intolerances including carvedilol , amlodipine , spironolactone , hydralazine .  She tolerates brand-name Bystolic .  Seen 07/19/23 by Krystal Pray, NP recovering from hysterectomy. BP initially 188/104 and repeat 158/98. She declined ARB. Clonidine  and Bystolic  refilled. Referral to Advanced Hypertension Clinic.   [name] was diagnosed with hypertension in her 33s after the passing of her husband. It has been difficult to control. Blood pressure checked with wrist cuff at home. Readings have been 170s/90s. she reports tobacco use never.For exercise she enjoys walking but notes more difficulty with her right leg. She plans to get back to her daily 5 exercises.  she eats at home and outside of the home. Has been working to reduce her sodium intake Does not drink caffeine.   She takes her Clonidine  if she needs it, when her right hand feels tight or her right lip ferels tight she willt ake her Clonidine . Taking her Clonidine  every so often less than once per week.   Taking Clonidine  half tablet daily as needed  Black seen oil Nitric oxide one tablet Salon cinnamon Olive oil Coconut oil Bp platinum  Previous antihypertensives: Carvedilol  Amlodipine  - heart to flip, creakign bones Spironolactone  - rash Hydralazine  - felt poorly  Secondary Causes of Hypertension  Medications/Herbal: OCP, steroids, stimulants, antidepressants, weight loss medication, immune suppressants, NSAIDs,  sympathomimetics, alcohol, caffeine, licorice, ginseng, St. John's wort, chemo  Sleep Apnea Renal artery stenosis Hyperaldosteronism Hyper/hypothyroidism Pheochromocytoma: palpitations, tachycardia, headache, diaphoresis (plasma metanephrines) Cushing's syndrome: Cushingoid facies, central obesity, proximal muscle weakness, and ecchymoses, adrenal incidentaloma (cortisol) Coarctation of the aorta  Past Medical History:  Diagnosis Date   Arthritis    Chicken pox    Depression    Diverticulitis    Elevated urinary free cortisol level 03/25/2014   mild with low potasssium  endo consult   disc with patient     Hypertension    Sciatica    Seasonal allergies    Stroke (HCC) 09/2008   Thyroid  disease     Past Surgical History:  Procedure Laterality Date   MINOR BREAST BIOPSY  1979   WISDOM TOOTH EXTRACTION      Current Medications: Current Meds  Medication Sig   Ascorbic Acid (VITAMIN C) 1000 MG tablet Take 1,000 mg by mouth every morning.   ASHWAGANDHA PO Take 1 capsule by mouth daily.   B Complex-C (B-COMPLEX WITH VITAMIN C) tablet Take 1 tablet by mouth daily.   BYSTOLIC  20 MG TABS Take 1 tablet (20 mg total) by mouth daily.   Cholecalciferol (VITAMIN D PO) Take 5,000 Units by mouth daily.    cloNIDine  (CATAPRES ) 0.1 MG tablet Take 1/2 tablet daily as needed (Patient taking differently: Take 0.1 mg by mouth as needed. Take 1/2 tablet daily as needed)   co-enzyme Q-10 30 MG capsule Take 30 mg by mouth daily.   dexamethasone  (DECADRON ) 4 MG tablet Take 4 mg by mouth daily.   Moringa Oleifera (MORINGA PO) Take 1 packet by mouth daily.    MULTIPLE VITAMIN PO Take  1 tablet by mouth daily.    OVER THE COUNTER MEDICATION Take 1 packet by mouth daily. Mushroom supplement   Probiotic Product (PROBIOTIC PO) Take 1 tablet by mouth daily. Bifido Beadlet 50+   SPIRULINA PO Take 1 teaspoon by mouth daily     Allergies:   Isovue [iopamidol], Erythromycin base, Penicillins, and Sulfa  antibiotics   Social History   Socioeconomic History   Marital status: Widowed    Spouse name: Not on file   Number of children: Not on file   Years of education: Not on file   Highest education level: Not on file  Occupational History   Not on file  Tobacco Use   Smoking status: Never   Smokeless tobacco: Never  Vaping Use   Vaping status: Not on file  Substance and Sexual Activity   Alcohol use: Never    Comment: Social Use only   Drug use: No   Sexual activity: Not Currently  Other Topics Concern   Not on file  Social History Narrative   Usually sleeps 7 hours per night   Lives with her son 7 y no pets    No pets   BA degree in various grad work   Note about 10 years husband was in the [employer]   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 very much   g2p2   Neg tad herbals exercise       Right handed   Social Drivers of Corporate investment banker Strain: Medium Risk (09/01/2023)   Overall Financial Resource Strain (CARDIA)    Difficulty of Paying Living Expenses: Somewhat hard  Food Insecurity: No Food Insecurity (09/01/2023)   Hunger Vital Sign    Worried About Running Out of Food in the Last Year: Never true    Ran Out of Food in the Last Year: Never true  Transportation Needs: No Transportation Needs (09/01/2023)   PRAPARE - Administrator, Civil Service (Medical): No    Lack of Transportation (Non-Medical): No  Physical Activity: Insufficiently Active (09/01/2023)   Exercise Vital Sign    Days of Exercise per Week: 2 days    Minutes of Exercise per Session: 10 min  Stress: No Stress Concern Present (09/01/2023)   [employer] of Occupational Health - Occupational Stress Questionnaire    Feeling of Stress: Not at all  Social Connections: Moderately Integrated (09/01/2023)   Social Connection and Isolation Panel    Frequency of Communication with Friends and Family: More than three  times a week    Frequency of Social Gatherings with Friends and Family: Three times a week    Attends Religious Services: 1 to 4 times per year    Active Member of Clubs or Organizations: Yes    Attends Banker Meetings: 1 to 4 times per year    Marital Status: Widowed     Family History: The patient's family history includes Cancer in her brother; Diabetes in her maternal grandmother and mother; Heart disease in her mother; Heart failure in her mother; Hypertension in her brother, father, mother, sister, and sister.  ROS:   Please see the history of present illness.    All other systems reviewed and are negative.  EKGs/Labs/Other Studies Reviewed:         Recent Labs: No results found for requested labs within last 365 days.   Recent Lipid Panel    Component Value Date/Time  CHOL 166 02/20/2020 1206   TRIG 70.0 02/20/2020 1206   HDL 64.30 02/20/2020 1206   CHOLHDL 3 02/20/2020 1206   VLDL 14.0 02/20/2020 1206   LDLCALC 88 02/20/2020 1206    Physical Exam:   VS:  BP (!) 154/108   Pulse 67   Ht 5' 4 (1.626 m)   Wt 171 lb (77.6 kg)   SpO2 98%   BMI 29.35 kg/m  , BMI Body mass index is 29.35 kg/m. GENERAL:  Well appearing HEENT: Pupils equal round and reactive, fundi not visualized, oral mucosa unremarkable NECK:  No jugular venous distention, waveform within normal limits, carotid upstroke brisk and symmetric, no bruits, no thyromegaly LYMPHATICS:  No cervical adenopathy LUNGS:  Clear to auscultation bilaterally HEART:  RRR.  PMI not displaced or sustained,S1 and S2 within normal limits, no S3, no S4, no clicks, no rubs, *** murmurs ABD:  Flat, positive bowel sounds normal in frequency in pitch, no bruits, no rebound, no guarding, no midline pulsatile mass, no hepatomegaly, no splenomegaly EXT:  2 plus pulses throughout, no edema, no cyanosis no clubbing SKIN:  No rashes no nodules NEURO:  Cranial nerves II through XII grossly intact, motor grossly  intact throughout PSYCH:  Cognitively intact, oriented to person place and time   ASSESSMENT/PLAN:    HTN -   LBBB - Monitor with periodic EKG.  Ascending aortic dilation - noted by prior imaging. Contrast allergy precludes CT. MRI 03/30/16 negative of significant aneurysm or dissection. Continue optimal BP control.   Hx of CVA - Continue to follow with PCP.   Screening for Secondary Hypertension: { Click here to document screening for secondary causes of HTN  :[id number]}    Relevant Labs/Studies:    Latest Ref Rng & Units 05/25/2021    3:18 PM 02/20/2020   12:06 PM 09/06/2019    2:57 AM  Basic Labs  Sodium 135 - 145 mmol/L 139  138  138   Potassium 3.5 - 5.1 mmol/L 3.5  4.1  3.7   Creatinine 0.44 - 1.00 mg/dL 4.09  8.11  9.14        Latest Ref Rng & Units 01/02/2018    9:49 AM 09/28/2016   12:05 PM  Thyroid    TSH 0.35 - 4.50 uIU/mL 4.12  3.655                  she *** interested in enrolling in the PREP exercise and nutrition program through the [facility].     Disposition:    FU with MD/APP/PharmD in {gen number [id number]} {Days to years:10300}    Medication Adjustments/Labs and Tests Ordered: Current medicines are reviewed at length with the patient today.  Concerns regarding medicines are outlined above.  No orders of the defined types were placed in this encounter.  No orders of the defined types were placed in this encounter.    Signed, Clearnce Curia, NP  09/01/2023 1:57 PM    [facility]

## 2023-09-01 NOTE — Patient Instructions (Addendum)
 Medication Instructions:  Continue your current medications   Labwork: Your physician recommends that you return for lab work today: magnesium, thyroid  panel, renin-aldosterone   Testing/Procedures: Your physician has requested that you have a renal artery duplex. During this test, an ultrasound is used to evaluate blood flow to the kidneys. Allow one hour for this exam. Do not eat after midnight the day before and avoid carbonated beverages. Take your medications as you usually do.   Bring your BP cuff to renal duplex and we will check it's accuracy   Follow-Up: Please follow up in 2 months in ADV HTN CLINIC with Dr. Theodis Fiscal, Neomi Banks, NP or Donivan Furry PharmD    Special Instructions:

## 2023-09-02 DIAGNOSIS — Z1731 Human epidermal growth factor receptor 2 positive status: Secondary | ICD-10-CM | POA: Diagnosis not present

## 2023-09-02 DIAGNOSIS — G629 Polyneuropathy, unspecified: Secondary | ICD-10-CM | POA: Diagnosis not present

## 2023-09-02 DIAGNOSIS — Z170 Estrogen receptor positive status [ER+]: Secondary | ICD-10-CM | POA: Diagnosis not present

## 2023-09-02 DIAGNOSIS — Z5112 Encounter for antineoplastic immunotherapy: Secondary | ICD-10-CM | POA: Diagnosis not present

## 2023-09-02 DIAGNOSIS — Z713 Dietary counseling and surveillance: Secondary | ICD-10-CM | POA: Diagnosis not present

## 2023-09-02 DIAGNOSIS — R6881 Early satiety: Secondary | ICD-10-CM | POA: Diagnosis not present

## 2023-09-02 DIAGNOSIS — Z5111 Encounter for antineoplastic chemotherapy: Secondary | ICD-10-CM | POA: Diagnosis not present

## 2023-09-02 DIAGNOSIS — C775 Secondary and unspecified malignant neoplasm of intrapelvic lymph nodes: Secondary | ICD-10-CM | POA: Diagnosis not present

## 2023-09-02 DIAGNOSIS — Z8673 Personal history of transient ischemic attack (TIA), and cerebral infarction without residual deficits: Secondary | ICD-10-CM | POA: Diagnosis not present

## 2023-09-02 DIAGNOSIS — I10 Essential (primary) hypertension: Secondary | ICD-10-CM | POA: Diagnosis not present

## 2023-09-02 DIAGNOSIS — E063 Autoimmune thyroiditis: Secondary | ICD-10-CM | POA: Diagnosis not present

## 2023-09-02 DIAGNOSIS — C541 Malignant neoplasm of endometrium: Secondary | ICD-10-CM | POA: Diagnosis not present

## 2023-09-02 DIAGNOSIS — R21 Rash and other nonspecific skin eruption: Secondary | ICD-10-CM | POA: Diagnosis not present

## 2023-09-02 DIAGNOSIS — I252 Old myocardial infarction: Secondary | ICD-10-CM | POA: Diagnosis not present

## 2023-09-02 DIAGNOSIS — E785 Hyperlipidemia, unspecified: Secondary | ICD-10-CM | POA: Diagnosis not present

## 2023-09-06 LAB — THYROID PANEL WITH TSH
Free Thyroxine Index: 2.4 (ref 1.2–4.9)
T3 Uptake Ratio: 31 % (ref 24–39)
T4, Total: 7.7 ug/dL (ref 4.5–12.0)
TSH: 2.49 u[IU]/mL (ref 0.450–4.500)

## 2023-09-06 LAB — ALDOSTERONE + RENIN ACTIVITY W/ RATIO
Aldos/Renin Ratio: >74.9 — ABNORMAL HIGH (ref 0.0–30.0)
Aldosterone: 12.5 ng/dL (ref 0.0–30.0)
Renin Activity, Plasma: <0.167 ng/mL/h — ABNORMAL LOW (ref 0.167–5.380)

## 2023-09-06 LAB — MAGNESIUM: Magnesium: 2.2 mg/dL (ref 1.6–2.3)

## 2023-09-07 ENCOUNTER — Ambulatory Visit (HOSPITAL_BASED_OUTPATIENT_CLINIC_OR_DEPARTMENT_OTHER): Payer: Self-pay | Admitting: Family

## 2023-09-07 ENCOUNTER — Telehealth: Payer: Self-pay

## 2023-09-07 ENCOUNTER — Other Ambulatory Visit (HOSPITAL_COMMUNITY): Payer: Self-pay

## 2023-09-07 ENCOUNTER — Other Ambulatory Visit (HOSPITAL_BASED_OUTPATIENT_CLINIC_OR_DEPARTMENT_OTHER): Payer: Self-pay

## 2023-09-07 ENCOUNTER — Telehealth: Payer: Self-pay | Admitting: Pharmacy Technician

## 2023-09-07 NOTE — Telephone Encounter (Signed)
 Secure chat sent to pharmacy at [facility] team to inquire on 90 day cost for comparison.   Krystal Cinelli S Cayla Wiegand, NP

## 2023-09-07 NOTE — Telephone Encounter (Signed)
 Called [facility] pharmacy (option 4, then 2)- last picked up 08/25/2023 172.80 for 30 day supply

## 2023-09-07 NOTE — Telephone Encounter (Signed)
 [facility] at [facility] and we are not contracted. She has tricare. I got her a coupon and just on the coupon it is 235.84 for 90 days at our pharmacy. Cash price for 30 days is 181.94 at our pharmacy. [facility] and [place]. [facility] and they are now saying she does have an approval on file from 2016 that showed her prior auth was good until 2099 but then the generic became available in 2021 and that made the approval void. Back in May I had to talk to a supervisor to get the answer of they would fill it. However, that fill never happened and now they are saying we need to do another prior authorization for patient to get brand. They said the a prior authorization for this has been denied previously multiple times since 2021. I did the prior authorization over the phone and the brand was denied again with case number [id number] they said it was denied due to not meeting tricare criteria. They said the criteria not met was that she did not have a significant adverse reaction from the generic, they said adverse reaction would be: life threatening, caused hospitalization or disability. Every note I can find says she can not tolerate generic but doesn't say what exactly happens. One note did say the brand was the only med that could help her severe hypertension but no notes saying she had an adverse reaction to the generic. [facility] to see how she has been getting it filled there and they said she actually has not been getting it on her insurance there, she has been getting it on their member discount to give that price of 172.80 for 30 days. I gave them the coupon that I got and they said on the coupon there at [facility] it is 102.43 for 30 days or 203.62 for 60 days. She doesn't have 90 days left on the prescription to test that cost at [facility] but looks like  it would be a little over 300.00 there. I called the patient and left her a message on both of her numbers.

## 2023-09-07 NOTE — Telephone Encounter (Signed)
 Discussed Bystolic  during recent clinic visit. Reports persistent difficulty picking up brand name Bystolic . Unclear when she last picked up. Cost is cheaper at [facility] than [facility] per my discussion with pharmacy.   Erma - can we call [facility] to see when she last picked up? Corean - CCing you in case we need further assistance or they won't fill  Reche GORMAN Finder, NP

## 2023-09-07 NOTE — Telephone Encounter (Signed)
 [facility] claim at [facility] and we are not contracted. She has tricare. I got her a coupon and just on the coupon it is 235.84 for 90 days at our pharmacy. Cash price for 30 days is 181.94 at our pharmacy. [facility] to see if [facility] can [facility] and they [place]. I [facility] and they are now saying she does have an approval on file from 2016 that showed her prior auth was good until 2099 but then the generic became available in 2021 and that made the approval void. Back in May I had to talk to a supervisor to get the answer of they would fill it. However, that fill never happened and now they are saying we need to do another prior authorization for patient to get brand. They said the a prior authorization for this has been denied previously multiple times since 2021. I did the prior authorization over the phone and the brand was denied again with case number [id number] they said it was denied due to not meeting tricare criteria. They said the criteria not met was that she did not have a significant adverse reaction from the generic, they said adverse reaction would be: life threatening, caused hospitalization or disability. Every note I can find says she can not tolerate generic but doesn't say what exactly happens. One note did say the brand was the only med that could help her severe hypertension but no notes saying she had an adverse reaction to the generic. [facility] to see how she has been getting it filled there and they said she actually has not been getting it on her insurance there, she has been getting it on their member discount to give that price of 172.80 for 30 days. I gave them the coupon that I got and they said on the coupon there at [facility] it is 102.43 for 30 days or 203.62 for 60 days. She doesn't have 90 days left on the prescription to test that cost at [facility] but looks like  it would be a little over 300.00 there. I called the patient and left her a message on both of her numbers.         Krystal Reche RAMAN, NP    09/07/23 12:07 PM Note Secure chat sent to pharmacy at [facility] team to inquire on 90 day cost for comparison.    Reche Krystal Vannie, NP        09/07/23 11:58 AM Krystal [name] HERO, RN routed this conversation to Krystal Reche RAMAN, NP  Krystal [name] HERO, RN KW   09/07/23 11:58 AM Note Called [facility] pharmacy (option 4, then 2)- last picked up 08/25/2023 172.80 for 30 day supply          09/07/23 11:53 AM Krystal Reche RAMAN, NP routed this conversation to Krystal [name] HERO, RN  Krystal Krystal, CPhT  Krystal Reche RAMAN, NP    09/07/23 11:53 AM Note Discussed Bystolic  during recent clinic visit. Reports persistent difficulty picking up brand name Bystolic . Unclear when she last picked up. Cost is cheaper at [facility] than [facility] per my discussion with pharmacy.    [name] - can we call [facility] to see when she last picked up? Gordon - CCing you in case we need further assistance or they won't fill   Reche Krystal Vannie, NP

## 2023-09-08 ENCOUNTER — Other Ambulatory Visit (HOSPITAL_COMMUNITY): Payer: Self-pay

## 2023-09-08 NOTE — Telephone Encounter (Signed)
 Hi, the patient called back and was wanting to get 90 days of Bystolic  at our [facility] if possible please? Our pharmacy seems to be the cheapest with running on just the coupon for 90 days. Also, the patient said she was very thankful for how much time you spent with her at her visit. Just wanted you to know how appreciative she was of your time. Thank you!

## 2023-09-09 ENCOUNTER — Other Ambulatory Visit (HOSPITAL_BASED_OUTPATIENT_CLINIC_OR_DEPARTMENT_OTHER): Payer: Self-pay

## 2023-09-09 ENCOUNTER — Other Ambulatory Visit (HOSPITAL_COMMUNITY): Payer: Self-pay

## 2023-09-09 MED ORDER — BYSTOLIC 20 MG PO TABS
1.0000 | ORAL_TABLET | Freq: Every day | ORAL | 1 refills | Status: DC
  Filled 2023-09-09: qty 90, 90d supply, fill #0

## 2023-09-09 MED ORDER — BYSTOLIC 20 MG PO TABS: 1.0000 | ORAL_TABLET | Freq: Every day | ORAL | 1 refills | Status: DC

## 2023-09-09 NOTE — Telephone Encounter (Signed)
 Our pharmacy got the rx and it was the price quoted but they said they can not run the bystolic  on just the coupon since she has federal insurance. They said even though the coupon manf said it was ok, pharmacy said it was not ok after all. I [facility] back since that is where she has been getting it. They ran it on just the coupon and it is 102.43 for 30 days. I called the patient and had to leave a message.

## 2023-09-09 NOTE — Addendum Note (Signed)
 Addended by: Khali Perella S on: 09/09/2023 01:21 PM   Modules accepted: Orders

## 2023-09-09 NOTE — Addendum Note (Signed)
 Addended by: Katieann Hungate S on: 09/09/2023 04:07 PM   Modules accepted: Orders

## 2023-09-09 NOTE — Telephone Encounter (Signed)
 Waiting on provider. Need to follow up to make sure runs for that price on the coupon for 90 days

## 2023-09-09 NOTE — Telephone Encounter (Signed)
 Pharmacy said they can not fill it on just the coupon since she has federal insurance even though the coupon said it was ok. I called the patient and left her a message. [facility] and asked them to fill instead.

## 2023-09-09 NOTE — Telephone Encounter (Signed)
 Rx sent to [facility].   Reche GORMAN Finder, NP

## 2023-09-13 NOTE — Telephone Encounter (Signed)
 Patient called back and she is aware we are not able to fill on just the coupon but [facility] did

## 2023-09-14 NOTE — Telephone Encounter (Signed)
 Discussed with patient  She is in the middle of chemo and worried that cause inaccurate levels, would like to wait until completes before doing  Will forward to Reche ORN NP as RICK

## 2023-09-15 ENCOUNTER — Telehealth: Payer: Self-pay

## 2023-09-15 NOTE — Telephone Encounter (Signed)
 [name] left me a message on 09/14/2023, I returned her call, mentioned the upcoming PREP class, and asked her to call me back

## 2023-09-22 ENCOUNTER — Encounter: Admitting: Family Medicine

## 2023-09-23 DIAGNOSIS — Z8673 Personal history of transient ischemic attack (TIA), and cerebral infarction without residual deficits: Secondary | ICD-10-CM | POA: Diagnosis not present

## 2023-09-23 DIAGNOSIS — Z5112 Encounter for antineoplastic immunotherapy: Secondary | ICD-10-CM | POA: Diagnosis not present

## 2023-09-23 DIAGNOSIS — Z5111 Encounter for antineoplastic chemotherapy: Secondary | ICD-10-CM | POA: Diagnosis not present

## 2023-09-23 DIAGNOSIS — Z1731 Human epidermal growth factor receptor 2 positive status: Secondary | ICD-10-CM | POA: Diagnosis not present

## 2023-09-23 DIAGNOSIS — I252 Old myocardial infarction: Secondary | ICD-10-CM | POA: Diagnosis not present

## 2023-09-23 DIAGNOSIS — Z79899 Other long term (current) drug therapy: Secondary | ICD-10-CM | POA: Diagnosis not present

## 2023-09-23 DIAGNOSIS — R21 Rash and other nonspecific skin eruption: Secondary | ICD-10-CM | POA: Diagnosis not present

## 2023-09-23 DIAGNOSIS — R079 Chest pain, unspecified: Secondary | ICD-10-CM | POA: Diagnosis not present

## 2023-09-23 DIAGNOSIS — C541 Malignant neoplasm of endometrium: Secondary | ICD-10-CM | POA: Diagnosis not present

## 2023-09-23 DIAGNOSIS — E063 Autoimmune thyroiditis: Secondary | ICD-10-CM | POA: Diagnosis not present

## 2023-09-23 DIAGNOSIS — Z170 Estrogen receptor positive status [ER+]: Secondary | ICD-10-CM | POA: Diagnosis not present

## 2023-09-23 DIAGNOSIS — G629 Polyneuropathy, unspecified: Secondary | ICD-10-CM | POA: Diagnosis not present

## 2023-09-23 DIAGNOSIS — E785 Hyperlipidemia, unspecified: Secondary | ICD-10-CM | POA: Diagnosis not present

## 2023-09-23 DIAGNOSIS — Z7952 Long term (current) use of systemic steroids: Secondary | ICD-10-CM | POA: Diagnosis not present

## 2023-09-23 DIAGNOSIS — C775 Secondary and unspecified malignant neoplasm of intrapelvic lymph nodes: Secondary | ICD-10-CM | POA: Diagnosis not present

## 2023-09-23 DIAGNOSIS — I10 Essential (primary) hypertension: Secondary | ICD-10-CM | POA: Diagnosis not present

## 2023-09-25 NOTE — Progress Notes (Unsigned)
  Cardiology Office Note:   Date:  09/25/2023  ID:  [name], DOB Aug 08, 1950, MRN [id number] PCP: Patient, No Pcp Per  [facility] Providers Cardiologist:  Lynwood Schilling, MD {  History of Present Illness:   [name] is a 73 y.o. female  who presents follow up of difficult to control hypertension.  She has been intolerant of multiple medications.   He has not tolerated carvedilol , amlodipine , spironolactone  and most recently hydralazine .  She seems to tolerate only brand-name Bystolic .  She presents for follow-up.  She is being seen at [facility] for management of endometrial cancer.   She has had adjuvant chemotherapy with carboplatin  AUC 6, paclitaxel  135 mg/m2, and trastuzumab  8 mg/kg . Plan for 6 cycles.  She is fatigued.  However, she is not having any acute cardiovascular symptoms.  She says she has not been exercising.  She is not having any new chest pressure, neck or arm discomfort.  She is not having any new shortness of breath, PND or orthopnea.  He had no palpitations, presyncope or syncope.  Unfortunately her blood pressure still not well-controlled.  She does get hydralazine  at [facility] sometimes prior to chemotherapy.  Of note I did see an echocardiogram they did and her ejection fraction was low normal.  She had some mild aortic insufficiency.  ROS: As stated in the HPI and negative for all other systems.  Studies Reviewed:    EKG:     NA  Risk Assessment/Calculations:      Physical Exam:   VS:  There were no vitals taken for this visit.   Wt Readings from Last 3 Encounters:  09/01/23 171 lb (77.6 kg)  07/19/23 175 lb (79.4 kg)  09/08/22 182 lb (82.6 kg)     GEN: Well nourished, well developed in no acute distress NECK: No JVD; No carotid bruits CARDIAC: RRR, 2 out of 6 diastolic murmur no systolic murmurs, rubs, gallops RESPIRATORY:  Clear to auscultation without rales, wheezing or rhonchi  ABDOMEN: Soft, non-tender, non-distended EXTREMITIES:   No edema; No deformity   ASSESSMENT AND PLAN:   CVA:   She has had no residual from this.  No change in therapy other than control of her blood pressure.   HTN:     She is very sensitive to medications.  She does agree again to try to take hydralazine  10 mg every 8 hours as needed systolic blood pressure greater than 180.   LBBB: This has been chronic.  No change in therapy.  ASCENDING AORTIC DILATATION:      The  was 41 mm on echo.  I will follow this in the future probably with an MRI as she has a contrast allergy.   Follow up with me in six months.   Signed, Lynwood Schilling, MD

## 2023-09-27 ENCOUNTER — Ambulatory Visit: Attending: Cardiology | Admitting: Cardiology

## 2023-09-27 ENCOUNTER — Encounter: Payer: Self-pay | Admitting: Cardiology

## 2023-09-27 ENCOUNTER — Other Ambulatory Visit (HOSPITAL_COMMUNITY): Payer: Self-pay

## 2023-09-27 VITALS — BP 196/116 | HR 68 | Ht 62.0 in | Wt 172.0 lb

## 2023-09-27 DIAGNOSIS — Z8673 Personal history of transient ischemic attack (TIA), and cerebral infarction without residual deficits: Secondary | ICD-10-CM | POA: Diagnosis not present

## 2023-09-27 DIAGNOSIS — I10 Essential (primary) hypertension: Secondary | ICD-10-CM | POA: Insufficient documentation

## 2023-09-27 DIAGNOSIS — I447 Left bundle-branch block, unspecified: Secondary | ICD-10-CM | POA: Diagnosis not present

## 2023-09-27 DIAGNOSIS — I77810 Thoracic aortic ectasia: Secondary | ICD-10-CM | POA: Diagnosis not present

## 2023-09-27 MED ORDER — HYDRALAZINE HCL 10 MG PO TABS: 10.0000 mg | ORAL_TABLET | ORAL | 3 refills | Status: DC | PRN

## 2023-09-27 MED ORDER — HYDRALAZINE HCL 10 MG PO TABS
10.0000 mg | ORAL_TABLET | ORAL | 3 refills | Status: DC | PRN
  Filled 2023-09-27: qty 30, 30d supply, fill #0

## 2023-09-27 NOTE — Patient Instructions (Signed)
 Medication Instructions:  STARTING hydralazine  10 mg as needed if systolic blood pressure (top number) is greater than 180.   *If you need a refill on your cardiac medications before your next appointment, please call your pharmacy*  Follow-Up: At [facility], you and your health needs are our priority.  As part of our continuing mission to provide you with exceptional heart care, our providers are all part of one team.  This team includes your primary Cardiologist (physician) and Advanced Practice Providers or APPs (Physician Assistants and Nurse Practitioners) who all work together to provide you with the care you need, when you need it.  Your next appointment:   1 year(s)  Provider:   Lynwood Schilling, MD    We recommend signing up for the patient portal called MyChart.  Sign up information is provided on this After Visit Summary.  MyChart is used to connect with patients for Virtual Visits (Telemedicine).  Patients are able to view lab/test results, encounter notes, upcoming appointments, etc.  Non-urgent messages can be sent to your provider as well.   To learn more about what you can do with MyChart, go to [email or web address].

## 2023-10-03 DIAGNOSIS — C541 Malignant neoplasm of endometrium: Secondary | ICD-10-CM | POA: Diagnosis not present

## 2023-10-10 ENCOUNTER — Telehealth: Payer: Self-pay

## 2023-10-10 NOTE — Telephone Encounter (Signed)
 Left message about next PREP class starting on 8/12, asked to return call.

## 2023-10-11 DIAGNOSIS — F4322 Adjustment disorder with anxiety: Secondary | ICD-10-CM | POA: Diagnosis not present

## 2023-10-14 DIAGNOSIS — I10 Essential (primary) hypertension: Secondary | ICD-10-CM | POA: Diagnosis not present

## 2023-10-14 DIAGNOSIS — E785 Hyperlipidemia, unspecified: Secondary | ICD-10-CM | POA: Diagnosis not present

## 2023-10-14 DIAGNOSIS — C541 Malignant neoplasm of endometrium: Secondary | ICD-10-CM | POA: Diagnosis not present

## 2023-10-14 DIAGNOSIS — Z5111 Encounter for antineoplastic chemotherapy: Secondary | ICD-10-CM | POA: Diagnosis not present

## 2023-10-14 DIAGNOSIS — Z5112 Encounter for antineoplastic immunotherapy: Secondary | ICD-10-CM | POA: Diagnosis not present

## 2023-10-17 ENCOUNTER — Encounter (HOSPITAL_BASED_OUTPATIENT_CLINIC_OR_DEPARTMENT_OTHER)

## 2023-10-21 ENCOUNTER — Encounter (HOSPITAL_BASED_OUTPATIENT_CLINIC_OR_DEPARTMENT_OTHER): Payer: Self-pay | Admitting: Family

## 2023-10-25 ENCOUNTER — Telehealth: Payer: Self-pay | Admitting: Pharmacy Technician

## 2023-10-25 ENCOUNTER — Telehealth: Payer: Self-pay | Admitting: Family

## 2023-10-25 DIAGNOSIS — F4322 Adjustment disorder with anxiety: Secondary | ICD-10-CM | POA: Diagnosis not present

## 2023-10-25 NOTE — Telephone Encounter (Signed)
 Pt called in stating she missed her appt on 10/17/23 and asked if she still needs to pay no show fee. Please advise.

## 2023-10-25 NOTE — Telephone Encounter (Signed)
    Had to call [facility]. Bystolic  is not approved by her insurance

## 2023-10-25 NOTE — Telephone Encounter (Signed)
 I called [facility] and they are having it filled on a coupon

## 2023-11-01 ENCOUNTER — Encounter: Admitting: Family Medicine

## 2023-11-01 DIAGNOSIS — F4322 Adjustment disorder with anxiety: Secondary | ICD-10-CM | POA: Diagnosis not present

## 2023-11-04 DIAGNOSIS — Z8673 Personal history of transient ischemic attack (TIA), and cerebral infarction without residual deficits: Secondary | ICD-10-CM | POA: Diagnosis not present

## 2023-11-04 DIAGNOSIS — C775 Secondary and unspecified malignant neoplasm of intrapelvic lymph nodes: Secondary | ICD-10-CM | POA: Diagnosis not present

## 2023-11-04 DIAGNOSIS — E063 Autoimmune thyroiditis: Secondary | ICD-10-CM | POA: Diagnosis not present

## 2023-11-04 DIAGNOSIS — C541 Malignant neoplasm of endometrium: Secondary | ICD-10-CM | POA: Diagnosis not present

## 2023-11-04 DIAGNOSIS — E785 Hyperlipidemia, unspecified: Secondary | ICD-10-CM | POA: Diagnosis not present

## 2023-11-04 DIAGNOSIS — Z170 Estrogen receptor positive status [ER+]: Secondary | ICD-10-CM | POA: Diagnosis not present

## 2023-11-04 DIAGNOSIS — Z7952 Long term (current) use of systemic steroids: Secondary | ICD-10-CM | POA: Diagnosis not present

## 2023-11-04 DIAGNOSIS — Z5112 Encounter for antineoplastic immunotherapy: Secondary | ICD-10-CM | POA: Diagnosis not present

## 2023-11-04 DIAGNOSIS — I10 Essential (primary) hypertension: Secondary | ICD-10-CM | POA: Diagnosis not present

## 2023-11-04 DIAGNOSIS — G629 Polyneuropathy, unspecified: Secondary | ICD-10-CM | POA: Diagnosis not present

## 2023-11-04 DIAGNOSIS — Z5111 Encounter for antineoplastic chemotherapy: Secondary | ICD-10-CM | POA: Diagnosis not present

## 2023-11-04 DIAGNOSIS — Z79899 Other long term (current) drug therapy: Secondary | ICD-10-CM | POA: Diagnosis not present

## 2023-11-04 DIAGNOSIS — R21 Rash and other nonspecific skin eruption: Secondary | ICD-10-CM | POA: Diagnosis not present

## 2023-11-10 ENCOUNTER — Ambulatory Visit (HOSPITAL_BASED_OUTPATIENT_CLINIC_OR_DEPARTMENT_OTHER): Admitting: Family

## 2023-11-10 ENCOUNTER — Encounter (HOSPITAL_BASED_OUTPATIENT_CLINIC_OR_DEPARTMENT_OTHER): Payer: Self-pay | Admitting: Family

## 2023-11-10 VITALS — BP 180/106 | HR 81 | Ht 61.5 in | Wt 168.6 lb

## 2023-11-10 DIAGNOSIS — I447 Left bundle-branch block, unspecified: Secondary | ICD-10-CM

## 2023-11-10 DIAGNOSIS — I10 Essential (primary) hypertension: Secondary | ICD-10-CM | POA: Diagnosis not present

## 2023-11-10 DIAGNOSIS — Z8673 Personal history of transient ischemic attack (TIA), and cerebral infarction without residual deficits: Secondary | ICD-10-CM

## 2023-11-10 MED ORDER — HYDRALAZINE HCL 10 MG PO TABS
10.0000 mg | ORAL_TABLET | Freq: Two times a day (BID) | ORAL | 1 refills | Status: AC
Start: 2023-11-10 — End: ?

## 2023-11-10 MED ORDER — HYDRALAZINE HCL 10 MG PO TABS
10.0000 mg | ORAL_TABLET | Freq: Two times a day (BID) | ORAL | 1 refills | Status: DC
Start: 2023-11-10 — End: 2023-11-10

## 2023-11-10 NOTE — Progress Notes (Signed)
 [facility] Initial Assessment:    Date:  11/10/2023   ID:  [name], DOB 01/06/1951, MRN [id number]  PCP:  Patient, No Pcp Per  Cardiologist:  Lynwood Schilling, MD  Nephrologist:  Referring MD: No ref. provider found   CC: Hypertension  History of Present Illness:    [name] is a 73 y.o. female with a hx of hypertension, LBBB, PVC, CAD, TIA, CVA here to establish care in the [facility].   Multiple medication intolerances including carvedilol , amlodipine , spironolactone , hydralazine .  She tolerates brand-name Bystolic .  Seen 07/19/23 by Josefa Beauvais, NP recovering from hysterectomy. BP initially 188/104 and repeat 158/98. She declined ARB. Clonidine  and Bystolic  refilled. Referral to [facility].   Established with [facility] 09/01/23. [name] was diagnosed with hypertension in her 60s after the passing of her husband. It has been difficult to control. Blood pressure checked with wrist cuff at home.No tobacco use, no caffeine use. Was working to reduce salt. She was taking Clonidine  PRN ~ once per week. She declined medication changes and wished to pursue secondary hypertension evaluation. Prior MRA 2018 and imaging 2025 with normal adrenal glands. She reports sleep apnea for which she previously worse nose strip but no prior formal testing. 08/2023 TSH unremarkable. Testing indeterminate for hyperaldosteronism (aldosterone 12.5, renin <0.167, aldos/renin ratio >74.) recommended for salt loading and 24 h urine testing which was not performed. Renal artery duplex upcoming 11/24/23.  At visit 09/27/23 she was given Hydralazine  10mg  PRN for SBP >180. BP at [facility] oncology visit 11/04/23 BP 190//96.   Presents today for follow up with her daughter. She has one more chemotherapy treatment upcoming in September then repeat imaging 3 weeks later. Feels different in her bones with Hydralazine   which she is taking sparingly but overall tolerating reasonably well. Generally prefers more naturopathic regimens. She is historically very sensitive to medication. BP at home on average 170s/90s. Notes this morning while getting ready was dizzy which her family member noted she might be dehydrated. She did feel better after drinking some water.   Previous antihypertensives: Carvedilol  Amlodipine  - heart to flip, creaking bones Spironolactone  - rash Hydralazine  - felt poorly  Past Medical History:  Diagnosis Date   Arthritis    Chicken pox    Depression    Diverticulitis    Elevated urinary free cortisol level 03/25/2014   mild with low potasssium  endo consult   disc with patient     Hypertension    Sciatica    Seasonal allergies    Stroke (HCC) 09/2008   Thyroid  disease     Past Surgical History:  Procedure Laterality Date   MINOR BREAST BIOPSY  1979   WISDOM TOOTH EXTRACTION      Current Medications: Current Meds  Medication Sig   Ascorbic Acid (VITAMIN C) 1000 MG tablet Take 1,000 mg by mouth every morning.   ASHWAGANDHA PO Take 1 capsule by mouth daily.   B Complex-C (B-COMPLEX WITH VITAMIN C) tablet Take 1 tablet by mouth daily.   BLACK CURRANT SEED OIL PO Take by mouth as needed.   BYSTOLIC  20 MG TABS Take 1 tablet (20 mg total) by mouth daily.   Cholecalciferol (VITAMIN D PO) Take 5,000 Units by mouth daily.    cloNIDine  (CATAPRES ) 0.1 MG tablet Take 1/2 tablet daily as needed   dexamethasone  (DECADRON ) 4 MG tablet Take 4 mg by mouth daily.   hydrALAZINE  (APRESOLINE ) 10 MG tablet Take 1 tablet (10 mg  total) by mouth as needed (For systolic blood pressure (top number) is greater than 180.).   Moringa Oleifera (MORINGA PO) Take 1 packet by mouth daily.    MULTIPLE VITAMIN PO Take 1 tablet by mouth daily.    NATTOKINASE PO Take 1 tablet by mouth every evening. Helps blood flow   OVER THE COUNTER MEDICATION Take 1 packet by mouth daily. Mushroom supplement    Probiotic Product (PROBIOTIC PO) Take 1 tablet by mouth daily. Bifido Beadlet 50+   SPIRULINA PO Take 1 teaspoon by mouth daily     Allergies:   Isovue [iopamidol], Erythromycin base, Penicillins, and Sulfa antibiotics   Social History   Socioeconomic History   Marital status: Widowed    Spouse name: Not on file   Number of children: Not on file   Years of education: Not on file   Highest education level: Not on file  Occupational History   Not on file  Tobacco Use   Smoking status: Never   Smokeless tobacco: Never  Vaping Use   Vaping status: Not on file  Substance and Sexual Activity   Alcohol use: Never    Comment: Social Use only   Drug use: No   Sexual activity: Not Currently  Other Topics Concern   Not on file  Social History Narrative   Usually sleeps 7 hours per night   Lives with her son 61 y no pets    No pets   BA degree in various grad work   Note about 10 years husband was in the [employer]   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 very much   g2p2   Neg tad herbals exercise       Right handed   Social Drivers of Corporate investment banker Strain: Medium Risk (09/01/2023)   Overall Financial Resource Strain (CARDIA)    Difficulty of Paying Living Expenses: Somewhat hard  Food Insecurity: No Food Insecurity (09/01/2023)   Hunger Vital Sign    Worried About Running Out of Food in the Last Year: Never true    Ran Out of Food in the Last Year: Never true  Transportation Needs: No Transportation Needs (09/01/2023)   PRAPARE - Administrator, Civil Service (Medical): No    Lack of Transportation (Non-Medical): No  Physical Activity: Insufficiently Active (09/01/2023)   Exercise Vital Sign    Days of Exercise per Week: 2 days    Minutes of Exercise per Session: 10 min  Stress: No Stress Concern Present (09/01/2023)   [employer] of Occupational Health - Occupational Stress  Questionnaire    Feeling of Stress: Not at all  Social Connections: Moderately Integrated (09/01/2023)   Social Connection and Isolation Panel    Frequency of Communication with Friends and Family: More than three times a week    Frequency of Social Gatherings with Friends and Family: Three times a week    Attends Religious Services: 1 to 4 times per year    Active Member of Clubs or Organizations: Yes    Attends Banker Meetings: 1 to 4 times per year    Marital Status: Widowed     Family History: The patient's family history includes Cancer in her brother; Diabetes in her maternal grandmother and mother; Heart disease in her mother; Heart failure in her mother; Hypertension in her brother, father, mother, sister, and sister.  ROS:   Please see the history of present  illness.    All other systems reviewed and are negative.  EKGs/Labs/Other Studies Reviewed:         Recent Labs: 09/01/2023: Magnesium 2.2; TSH 2.490   Recent Lipid Panel    Component Value Date/Time   CHOL 166 02/20/2020 1206   TRIG 70.0 02/20/2020 1206   HDL 64.30 02/20/2020 1206   CHOLHDL 3 02/20/2020 1206   VLDL 14.0 02/20/2020 1206   LDLCALC 88 02/20/2020 1206    Physical Exam:   VS:  BP (!) 180/106 (BP Location: Left Arm, Patient Position: Sitting, Cuff Size: Normal)   Pulse 81   Ht 5' 1.5 (1.562 m)   Wt 168 lb 9.6 oz (76.5 kg)   SpO2 95%   BMI 31.34 kg/m  , BMI Body mass index is 31.34 kg/m. GENERAL:  Well appearing HEENT: Pupils equal round and reactive, fundi not visualized, oral mucosa unremarkable NECK:  No jugular venous distention, waveform within normal limits, carotid upstroke brisk and symmetric, no bruits, no thyromegaly LYMPHATICS:  No cervical adenopathy LUNGS:  Clear to auscultation bilaterally HEART:  RRR.  PMI not displaced or sustained,S1 and S2 within normal limits, no S3, no S4, no clicks, no rubs, no murmurs ABD:  Flat, positive bowel sounds normal in frequency in  pitch, no bruits, no rebound, no guarding, no midline pulsatile mass, no hepatomegaly, no splenomegaly EXT:  2 plus pulses throughout, no edema, no cyanosis no clubbing SKIN:  No rashes no nodules NEURO:  Cranial nerves II through XII grossly intact, motor grossly intact throughout PSYCH:  Cognitively intact, oriented to person place and time   ASSESSMENT/PLAN:    HTN - BP not at goal <130/80.  Labs indeterminate for hyperaldosteronism. With elevated BP, hesitant to pursue sodium loading for confirmatory testing. Discussed possible trial of Eplerenone, prior intolerance ot Spironolactone  with rash though does not recall severity. She is hesitant about starting a new medication in midst of chemotherapy.  Adjust Hydralazine  10mg  from PRN to BID with additional does PRN for SBP >180. Continue Bystolic  20mg  daily. Complete renal artery duplex as scheduled Bring home BP cuff to next clinic visit Recommend aiming for 150 minutes of moderate intensity activity per week and following a heart healthy diet.  Consider sleep study at follow up  LBBB - Monitor with periodic EKG. No near syncope, syncope.   Ascending aortic dilation - noted by prior imaging. Contrast allergy precludes CT. MRI 03/30/16 negative for significant aneurysm or dissection. Continue optimal BP control.   Hx of CVA - Continue to follow with PCP.   Screening for Secondary Hypertension:     Relevant Labs/Studies:    Latest Ref Rng & Units 05/25/2021    3:18 PM 02/20/2020   12:06 PM 09/06/2019    2:57 AM  Basic Labs  Sodium 135 - 145 mmol/L 139  138  138   Potassium 3.5 - 5.1 mmol/L 3.5  4.1  3.7   Creatinine 0.44 - 1.00 mg/dL 8.87  8.93  8.81        Latest Ref Rng & Units 09/01/2023    3:12 PM 01/02/2018    9:49 AM  Thyroid    TSH 0.450 - 4.500 uIU/mL 2.490  4.12        Latest Ref Rng & Units 09/01/2023    3:12 PM  Renin/Aldosterone   Aldosterone 0.0 - 30.0 ng/dL 87.4   Aldos/Renin Ratio 0.0 - 30.0 >74.9               09/01/2023  2:50 PM  Renovascular   Renal Artery US  Completed Yes     Disposition:    FU with MD/APP/PharmD in 2 months    Medication Adjustments/Labs and Tests Ordered: Current medicines are reviewed at length with the patient today.  Concerns regarding medicines are outlined above.  No orders of the defined types were placed in this encounter.  No orders of the defined types were placed in this encounter.    Signed, Reche GORMAN Finder, NP  11/10/2023 1:59 PM    [facility]

## 2023-11-10 NOTE — Patient Instructions (Addendum)
 Medication Instructions:  CHANGE Hydralazine  to 10mg  twice per day. May take an additional tablet as needed for systolic blood pressure (top number) more than 180.    Testing/Procedures: Proceed with renal artery duplex as scheduled.   Follow-Up: Please follow up in 2-3 months in [facility] with Dr. Raford, Reche Finder, NP or Allean Mink PharmD

## 2023-11-11 DIAGNOSIS — F4322 Adjustment disorder with anxiety: Secondary | ICD-10-CM | POA: Diagnosis not present

## 2023-11-24 ENCOUNTER — Ambulatory Visit (HOSPITAL_BASED_OUTPATIENT_CLINIC_OR_DEPARTMENT_OTHER)

## 2023-11-24 DIAGNOSIS — I10 Essential (primary) hypertension: Secondary | ICD-10-CM | POA: Diagnosis not present

## 2023-11-25 DIAGNOSIS — E785 Hyperlipidemia, unspecified: Secondary | ICD-10-CM | POA: Diagnosis not present

## 2023-11-25 DIAGNOSIS — Z8673 Personal history of transient ischemic attack (TIA), and cerebral infarction without residual deficits: Secondary | ICD-10-CM | POA: Diagnosis not present

## 2023-11-25 DIAGNOSIS — C541 Malignant neoplasm of endometrium: Secondary | ICD-10-CM | POA: Diagnosis not present

## 2023-11-25 DIAGNOSIS — I252 Old myocardial infarction: Secondary | ICD-10-CM | POA: Diagnosis not present

## 2023-11-25 DIAGNOSIS — Z170 Estrogen receptor positive status [ER+]: Secondary | ICD-10-CM | POA: Diagnosis not present

## 2023-11-25 DIAGNOSIS — Z5111 Encounter for antineoplastic chemotherapy: Secondary | ICD-10-CM | POA: Diagnosis not present

## 2023-11-25 DIAGNOSIS — Z5112 Encounter for antineoplastic immunotherapy: Secondary | ICD-10-CM | POA: Diagnosis not present

## 2023-11-25 DIAGNOSIS — I10 Essential (primary) hypertension: Secondary | ICD-10-CM | POA: Diagnosis not present

## 2023-11-25 DIAGNOSIS — Z1731 Human epidermal growth factor receptor 2 positive status: Secondary | ICD-10-CM | POA: Diagnosis not present

## 2023-12-14 DIAGNOSIS — C541 Malignant neoplasm of endometrium: Secondary | ICD-10-CM | POA: Diagnosis not present

## 2023-12-16 DIAGNOSIS — Z1731 Human epidermal growth factor receptor 2 positive status: Secondary | ICD-10-CM | POA: Diagnosis not present

## 2023-12-16 DIAGNOSIS — C541 Malignant neoplasm of endometrium: Secondary | ICD-10-CM | POA: Diagnosis not present

## 2023-12-16 DIAGNOSIS — I10 Essential (primary) hypertension: Secondary | ICD-10-CM | POA: Diagnosis not present

## 2023-12-16 DIAGNOSIS — E063 Autoimmune thyroiditis: Secondary | ICD-10-CM | POA: Diagnosis not present

## 2023-12-16 DIAGNOSIS — Z8673 Personal history of transient ischemic attack (TIA), and cerebral infarction without residual deficits: Secondary | ICD-10-CM | POA: Diagnosis not present

## 2023-12-16 DIAGNOSIS — Z5112 Encounter for antineoplastic immunotherapy: Secondary | ICD-10-CM | POA: Diagnosis not present

## 2023-12-16 DIAGNOSIS — C775 Secondary and unspecified malignant neoplasm of intrapelvic lymph nodes: Secondary | ICD-10-CM | POA: Diagnosis not present

## 2023-12-16 DIAGNOSIS — Z5111 Encounter for antineoplastic chemotherapy: Secondary | ICD-10-CM | POA: Diagnosis not present

## 2023-12-16 DIAGNOSIS — I252 Old myocardial infarction: Secondary | ICD-10-CM | POA: Diagnosis not present

## 2023-12-16 DIAGNOSIS — E785 Hyperlipidemia, unspecified: Secondary | ICD-10-CM | POA: Diagnosis not present

## 2023-12-16 DIAGNOSIS — Z170 Estrogen receptor positive status [ER+]: Secondary | ICD-10-CM | POA: Diagnosis not present

## 2023-12-22 DIAGNOSIS — I10 Essential (primary) hypertension: Secondary | ICD-10-CM | POA: Diagnosis not present

## 2023-12-22 DIAGNOSIS — C541 Malignant neoplasm of endometrium: Secondary | ICD-10-CM | POA: Diagnosis not present

## 2023-12-27 ENCOUNTER — Encounter (INDEPENDENT_AMBULATORY_CARE_PROVIDER_SITE_OTHER): Admitting: Family Medicine

## 2023-12-27 ENCOUNTER — Telehealth: Payer: Self-pay

## 2023-12-27 NOTE — Telephone Encounter (Signed)
 Copied from CRM [id number]. Topic: Appointments - Transfer of Care >> Dec 27, 2023  1:41 PM Ashley R wrote: Pt is requesting to transfer FROM: [facility] Pt is requesting to transfer TO: Geni Shutter, DO Reason for requested transfer: Patient/s calling to schedule an appointment. Needs an in person AWV but also needs to establish care. Was unable to connect with a provider at [facility] previously and now does not know who can assist. Recently complete treatment and looking for a good fit in the area for more holistic care.  It is the responsibility of the team the patient would like to transfer to (Dr. Shutter) to reach out to the patient if for any reason this transfer is not acceptable.  Contacted patient to inform her Dr. Shutter is not with us  until Nov. I was unable to get in contact with patient. LVMTCB

## 2023-12-27 NOTE — Progress Notes (Signed)
 error

## 2023-12-28 NOTE — Telephone Encounter (Signed)
Noted. Appt has been scheduled.

## 2023-12-30 DIAGNOSIS — F4322 Adjustment disorder with anxiety: Secondary | ICD-10-CM | POA: Diagnosis not present

## 2024-01-09 DIAGNOSIS — R931 Abnormal findings on diagnostic imaging of heart and coronary circulation: Secondary | ICD-10-CM | POA: Diagnosis not present

## 2024-01-09 DIAGNOSIS — C541 Malignant neoplasm of endometrium: Secondary | ICD-10-CM | POA: Diagnosis not present

## 2024-01-16 ENCOUNTER — Encounter (HOSPITAL_BASED_OUTPATIENT_CLINIC_OR_DEPARTMENT_OTHER): Payer: Self-pay

## 2024-01-16 NOTE — Progress Notes (Unsigned)
 Advanced Hypertension Clinic Assessment:    Date:  01/19/2024   ID:  [name], DOB 1950/03/18, MRN [id number]  PCP:  Patient, No Pcp Per  Cardiologist:  Lynwood Schilling, MD  Nephrologist:  Referring MD: No ref. provider found   CC: Hypertension  History of Present Illness:    [name] is a 73 y.o. female with a hx of hypertension, LBBB, PVC, CAD, TIA, CVA here to establish care in the Advanced Hypertension Clinic.   Multiple medication intolerances including carvedilol , amlodipine , spironolactone , hydralazine .  She tolerates brand-name Bystolic .  05/10/23 she had endometrial biopsy indicating high grad serous endometrial adenocaarcinoma, p53 mutated. 07/05/23 underwent robotic assisted hysterectomy. Final pathology revealed serous carcinoma of the endometrium, 5.2cm with 1mm of 10mm myo invasion (no LVSI), negative adnexa and cervix, with a 2cm left external iliac macrometastis.  She subsequently 08/09/23 started chemotherapy regimen of carboplatin  AUC 6, paclitaxel  135 mg/m2, trastuzumab . However, on 09/02/23 paclitaxel  caused intense itching and was changed to abraxane  200mg /m2.   Established with Advanced Hypertension Clinic 09/01/23. [name] was diagnosed with hypertension in her 66s after the passing of her husband. Blood pressure checked with wrist cuff at home.No tobacco nor caffeine use. Was working to reduce salt. She was taking Clonidine  PRN ~ once per week. She declined medication changes and wished to pursue secondary hypertension evaluation. Prior MRA 2018 and imaging 2025 with normal adrenal glands. She reported sleep apnea for which she previously worse nose strip but no prior formal testing. 08/2023 TSH unremarkable. Testing indeterminate for hyperaldosteronism (aldosterone 12.5, renin <0.167, aldos/renin ratio >74.) recommended for salt loading and 24 h urine testing which was not performed.   At visit 09/27/23 she was again hesitant  regarding medication changes though agreeable to Hydralazine  10mg  PRN for SBP >180. At visit 11/10/23 Hydralazine  adjusted to 10mg  BID with additional 10mg  PRN.  Prior echo 07/2023 LVEF 55% (though 58% by 3D measurement), gr2dd, RVSF normal, mild AI (AI peak velocity 5.2 m/s), trivial MR, no PR, mild TR, normal wall motion. Ascending aorta 4.5 cm.   Renal duplex 11/24/23 with no renal artery stenosis.   CT 12/2023 with no metastatic disease. Carboplatin  AUC and aabraxane were discontinued, trastuzumab  maintenance initiated. Most recent cycle of trastuzumab  12/22/23. Echo per [facility] team 12/22/23 LVEF 35% (though 41% by 3D measurement), indeterminate diastolic function, RVSF normal, moderate AI *AI peak velocity 5.5 m/s), trivial MR/PR/TR. Also noted abnormal LV septal wall motion abnormality which could be related to wide QRS, clinical correlation advised.  Ascending aorta 3.7 cm.   At visit with GYN-ONC  team 01/09/24 noted exertional dyspnea but no edema, chest pain. Trastuzumab  placed on hold due to decreased LVEF. Her BP at that clinic visit was 173/97.  Presents today for follow up with her daughter. Per GYN-ONC team, if heart failure felt to be from underlying cardiothoracic history and not trastuzumab  could reconsider restarting maintenance therapy. Trastuzumab  with instance of heart failure 4-22%. Reviewed potential causes of heart failure including trastuzumab , uncontrolled hypertension, coronary artery disease.  No prior ischemic evaluation.  She notes exertional dyspnea and fatigue which she attributes to completing physical therapy and has been hopeful will gradually improve.  No orthopnea, PND, chest pain.  She has some mild swelling in her left hand but not in her feet or ankles.  She notes being sensitive to medications though does tolerate Bystolic  20 mg daily, hydralazine  10 mg twice daily.  Her BP is not adequately controlled at home or at recent visits with  medical providers.  She notes  sleeping overall well recently though notes her sleep cycles with the moon. Reviewed need for low sodium diet and fluid restriction <64 oz. She has been referred to [facility] cardio-onc [facility] but visit not until 03/2024.  Previous antihypertensives: Carvedilol  Amlodipine  - heart to flip, creaking bones Spironolactone  - rash Hydralazine  - felt poorly Losartan  - creaking bones, muscles didn't feel right  Past Medical History:  Diagnosis Date   Arthritis    Cancer (HCC) 05/2023   Chicken pox    Depression    Diverticulitis    Elevated urinary free cortisol level 03/25/2014   mild with low potasssium  endo consult   disc with patient     Hypertension    Sciatica    Seasonal allergies    Stroke (HCC) 09/2008   Thyroid  disease     Past Surgical History:  Procedure Laterality Date   MINOR BREAST BIOPSY  1979   WISDOM TOOTH EXTRACTION      Current Medications: Current Meds  Medication Sig   Ascorbic Acid (VITAMIN C) 1000 MG tablet Take 1,000 mg by mouth every morning.   ASHWAGANDHA PO Take 1 capsule by mouth daily.   B Complex-C (B-COMPLEX WITH VITAMIN C) tablet Take 1 tablet by mouth daily.   BYSTOLIC  20 MG TABS Take 1 tablet (20 mg total) by mouth daily.   Cholecalciferol (VITAMIN D PO) Take 5,000 Units by mouth daily.    Cyanocobalamin (B-12 PO) Take 1 tablet by mouth daily.   diphenhydrAMINE  (BENADRYL ) 25 MG tablet Take two tablets 1 hour prior to scan.   hydrALAZINE  (APRESOLINE ) 10 MG tablet Take 1 tablet (10 mg total) by mouth 2 (two) times daily. May take an additional tablet as needed for systolic blood pressure (top number) more than 180.   [EXPIRED] ivabradine (CORLANOR) 7.5 MG TABS tablet Take 2 tablets (15 mg total) by mouth once for 1 dose. Take 90-120 minutes prior to scan.   Moringa Oleifera (MORINGA PO) Take 1 packet by mouth daily.    MULTIPLE VITAMIN PO Take 1 tablet by mouth daily.    NATTOKINASE PO Take 1 tablet by mouth every evening. Helps blood flow    OVER THE COUNTER MEDICATION Take 1 packet by mouth daily. Mushroom supplement   OVER THE COUNTER MEDICATION as directed. Black seed oil   predniSONE  (DELTASONE ) 50 MG tablet Take one tablet 13 hours, 7 hours, and 1 hour prior to scan.   sacubitril-valsartan (ENTRESTO) 49-51 MG Take 1 tablet by mouth 2 (two) times daily.   sacubitril-valsartan (ENTRESTO) 49-51 MG Take 1 tablet by mouth 2 (two) times daily.   SPIRULINA PO Take 1 teaspoon by mouth daily   [DISCONTINUED] BLACK CURRANT SEED OIL PO Take by mouth as needed.   [DISCONTINUED] cloNIDine  (CATAPRES ) 0.1 MG tablet Take 1/2 tablet daily as needed     Allergies:   Isovue [iopamidol], Erythromycin base, Iodinated contrast media, Penicillins, and Sulfa antibiotics   Social History   Socioeconomic History   Marital status: Widowed    Spouse name: Not on file   Number of children: Not on file   Years of education: Not on file   Highest education level: Bachelor's degree (e.g., BA, AB, BS)  Occupational History   Not on file  Tobacco Use   Smoking status: Never    Passive exposure: Past (Dad smoked all the time)   Smokeless tobacco: Never  Vaping Use   Vaping status: Not on file  Substance and Sexual Activity  Alcohol use: Never    Comment: Social Use only   Drug use: No   Sexual activity: Not Currently  Other Topics Concern   Not on file  Social History Narrative   Usually sleeps 7 hours per night   Lives with her son 2 y no pets    No pets   BA degree in various grad work   Note about 10 years husband was in the [employer]   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 right now very much   g2p2   Neg tad herbals exercise       Right handed   Social Drivers of Corporate Investment Banker Strain: Medium Risk (12/26/2023)   Overall Financial Resource Strain (CARDIA)    Difficulty of Paying Living Expenses: Somewhat hard  Food Insecurity: Patient Declined (01/17/2024)    Hunger Vital Sign    Worried About Running Out of Food in the Last Year: Patient declined    Ran Out of Food in the Last Year: Patient declined  Transportation Needs: No Transportation Needs (12/26/2023)   PRAPARE - Administrator, Civil Service (Medical): No    Lack of Transportation (Non-Medical): No  Physical Activity: Inactive (12/26/2023)   Exercise Vital Sign    Days of Exercise per Week: 0 days    Minutes of Exercise per Session: Not on file  Stress: Stress Concern Present (12/26/2023)   [employer] of Occupational Health - Occupational Stress Questionnaire    Feeling of Stress: To some extent  Social Connections: Moderately Isolated (12/26/2023)   Social Connection and Isolation Panel    Frequency of Communication with Friends and Family: Once a week    Frequency of Social Gatherings with Friends and Family: Once a week    Attends Religious Services: More than 4 times per year    Active Member of [employer] or Organizations: Yes    Attends Banker Meetings: More than 4 times per year    Marital Status: Widowed     Family History: The patient's family history includes Asthma in her brother, brother, maternal grandmother, and paternal grandmother; Cancer in her brother; Diabetes in her maternal grandmother and mother; Heart disease in her mother; Heart failure in her mother; Hypertension in her brother, father, mother, sister, and sister.  ROS:   Please see the history of present illness.    All other systems reviewed and are negative.  EKGs/Labs/Other Studies Reviewed:         Recent Labs: 09/01/2023: Magnesium 2.2; TSH 2.490   Recent Lipid Panel    Component Value Date/Time   CHOL 166 02/20/2020 1206   TRIG 70.0 02/20/2020 1206   HDL 64.30 02/20/2020 1206   CHOLHDL 3 02/20/2020 1206   VLDL 14.0 02/20/2020 1206   LDLCALC 88 02/20/2020 1206    Physical Exam:   VS:  BP (!) 162/102 (BP Location: Left Arm, Patient Position: Sitting, Cuff  Size: Large)   Pulse 91   Ht 5' 1.5 (1.562 m)   Wt 169 lb 4.8 oz (76.8 kg)   SpO2 97%   BMI 31.47 kg/m  , BMI Body mass index is 31.47 kg/m.  Vitals:   01/17/24 1447 01/17/24 1452  BP: (!) 160/108 (!) 162/102  Pulse: 91   Height: 5' 1.5 (1.562 m)   Weight: 169 lb 4.8 oz (76.8 kg)   SpO2: 97%   BMI (Calculated): 31.47     GENERAL:  Well appearing  HEENT: Pupils equal round and reactive, fundi not visualized, oral mucosa unremarkable NECK:  No jugular venous distention, waveform within normal limits, carotid upstroke brisk and symmetric, no bruits, no thyromegaly LYMPHATICS:  No cervical adenopathy LUNGS:  Clear to auscultation bilaterally HEART:  RRR.  PMI not displaced or sustained,S1 and S2 within normal limits, no S3, no S4, no clicks, no rubs, no murmurs ABD:  Flat, positive bowel sounds normal in frequency in pitch, no bruits, no rebound, no guarding, no midline pulsatile mass, no hepatomegaly, no splenomegaly EXT:  2 plus pulses throughout, no edema, no cyanosis no clubbing SKIN:  No rashes no nodules NEURO:  Cranial nerves II through XII grossly intact, motor grossly intact throughout PSYCH:  Cognitively intact, oriented to person place and time   ASSESSMENT/PLAN:    HFrEF - new onset HFrEF echo 07/2023 LVEF 58% ? 12/2023 LVEF 41% by 3D measurement. Consider etiology Trastuzumab  vs uncontrolled hypertension vs CAD. Symptomatic  Plan for ischemic evaluation with cardiac CTA. Aware of use of nitroglycerin during study.  We discussed that completing workup without starting medical therapy is not recommended and would need to make medication changes to strengthen heart muscle function. She has been historically hesitant regarding medications as endorses being sensitive. Medical therapy Continue Bystolic  20mg  daily.  Rx Entresto 49-51mg  BID with BMET, BNP in 1-2 weeks. Samples provided in clinic.  Defer loop diuretic as euvolemic on exam Of note, previously did not tolerate  Spironolactone  with rash. Could consider trial of Eplerenone.  Consider SGLT2i at follow up  Will route to Advanced Heart Failure team to see if she can be evaluated sooner than present cardio-onc appointment 03/2024 at [facility]  HTN - Multiple prior medication intolerances. Has been historically hesitant regarding medication changes.  As above, continue Bystolic  20mg  daily, Hydralazine  10mg  BID (additional 10mg  PRN for SBP >180), and add Entresto 49-51mg  BID Stop PRN clonidine  due to potential rebound hypertension Discussed to monitor BP at home at least 2 hours after medications and sitting for 5-10 minutes.  Secondary workup Imaging 2025 normal adrenals 08/2023 normal TSH Labs indeterminate for hyperaldosteronism. With elevated BP, hesitant to pursue sodium loading for confirmatory testing. Consider future trial of Eplerenone, prior intolerance ot Spironolactone  with rash though does not recall severity. 11/2023 renal artery duplex with on stenosis Notes sleeping overall well, will defer sleep study at this time.  LBBB - Monitor with periodic EKG. No near syncope, syncope. If HF persists, could consider CRT-D.  Ascending aortic dilation - noted by prior imaging. Contrast allergy precludes CT. MRI 03/30/16 negative for significant aneurysm or dissection. Echo 07/2023 ascending aorta 4.5cm. Repeat echo 11/2023 ascending aorta 3.7 cm. Suspect was over-measured on 07/2023 study. Recommend optimal BP control, as above.    Hx of CVA - Continue to follow with PCP.   Screening for Secondary Hypertension:     Relevant Labs/Studies:    Latest Ref Rng & Units 05/25/2021    3:18 PM 02/20/2020   12:06 PM 09/06/2019    2:57 AM  Basic Labs  Sodium 135 - 145 mmol/L 139  138  138   Potassium 3.5 - 5.1 mmol/L 3.5  4.1  3.7   Creatinine 0.44 - 1.00 mg/dL 8.87  8.93  8.81        Latest Ref Rng & Units 09/01/2023    3:12 PM 01/02/2018    9:49 AM  Thyroid    TSH 0.450 - 4.500 uIU/mL 2.490  4.12        Latest  Ref Rng & Units 09/01/2023    3:12 PM  Renin/Aldosterone   Aldosterone 0.0 - 30.0 ng/dL 87.4   Aldos/Renin Ratio 0.0 - 30.0 >74.9              11/24/2023   10:00 AM  Renovascular   Renal Artery US  Completed Yes     Disposition:    FU with MD/APP/PharmD in 1 months    Medication Adjustments/Labs and Tests Ordered: Current medicines are reviewed at length with the patient today.  Concerns regarding medicines are outlined above.  Orders Placed This Encounter  Procedures   CT CORONARY MORPH W/CTA COR W/SCORE W/CA W/CM &/OR WO/CM   Basic metabolic panel with GFR   AMB referral to [facility]   Meds ordered this encounter  Medications   predniSONE  (DELTASONE ) 50 MG tablet    Sig: Take one tablet 13 hours, 7 hours, and 1 hour prior to scan.    Dispense:  3 tablet    Refill:  0   diphenhydrAMINE  (BENADRYL ) 25 MG tablet    Sig: Take two tablets 1 hour prior to scan.    Dispense:  2 tablet    Refill:  0    MD approved change   ivabradine (CORLANOR) 7.5 MG TABS tablet    Sig: Take 2 tablets (15 mg total) by mouth once for 1 dose. Take 90-120 minutes prior to scan.    Dispense:  2 tablet    Refill:  0    Use cash pricing for corlanor procedure   sacubitril-valsartan (ENTRESTO) 49-51 MG    Sig: Take 1 tablet by mouth 2 (two) times daily.    Dispense:  56 tablet    Refill:  0    Lot Number?:   [id number]    Expiration Date?:   12/13/2024    Quantity:   56   sacubitril-valsartan (ENTRESTO) 49-51 MG    Sig: Take 1 tablet by mouth 2 (two) times daily.    Dispense:  60 tablet    Refill:  3    Please Honor Card patient is presenting for Sim KLEIN: [id number]; KARIN: [id number]; RXPCN: [id number]; RXID: [id number]     Signed, Reche GORMAN Finder, NP  01/19/2024 1:28 PM    [facility]

## 2024-01-17 ENCOUNTER — Other Ambulatory Visit (HOSPITAL_BASED_OUTPATIENT_CLINIC_OR_DEPARTMENT_OTHER): Payer: Self-pay

## 2024-01-17 ENCOUNTER — Encounter (HOSPITAL_BASED_OUTPATIENT_CLINIC_OR_DEPARTMENT_OTHER): Payer: Self-pay

## 2024-01-17 ENCOUNTER — Ambulatory Visit (INDEPENDENT_AMBULATORY_CARE_PROVIDER_SITE_OTHER): Admitting: Family

## 2024-01-17 ENCOUNTER — Other Ambulatory Visit (HOSPITAL_COMMUNITY): Payer: Self-pay

## 2024-01-17 ENCOUNTER — Encounter (HOSPITAL_BASED_OUTPATIENT_CLINIC_OR_DEPARTMENT_OTHER): Payer: Self-pay | Admitting: Family

## 2024-01-17 VITALS — BP 162/102 | HR 91 | Ht 61.5 in | Wt 169.3 lb

## 2024-01-17 DIAGNOSIS — R072 Precordial pain: Secondary | ICD-10-CM | POA: Diagnosis not present

## 2024-01-17 DIAGNOSIS — I447 Left bundle-branch block, unspecified: Secondary | ICD-10-CM | POA: Diagnosis not present

## 2024-01-17 DIAGNOSIS — I5022 Chronic systolic (congestive) heart failure: Secondary | ICD-10-CM

## 2024-01-17 DIAGNOSIS — I10 Essential (primary) hypertension: Secondary | ICD-10-CM

## 2024-01-17 MED ORDER — IVABRADINE HCL 7.5 MG PO TABS
15.0000 mg | ORAL_TABLET | Freq: Once | ORAL | 0 refills | Status: AC
  Filled 2024-01-17: qty 2, 1d supply, fill #0

## 2024-01-17 MED ORDER — SACUBITRIL-VALSARTAN 49-51 MG PO TABS: 1.0000 | ORAL_TABLET | Freq: Two times a day (BID) | ORAL | 0 refills | Status: DC

## 2024-01-17 MED ORDER — SACUBITRIL-VALSARTAN 49-51 MG PO TABS: 1.0000 | ORAL_TABLET | Freq: Two times a day (BID) | ORAL | 3 refills | Status: DC

## 2024-01-17 MED ORDER — PREDNISONE 50 MG PO TABS
ORAL_TABLET | ORAL | 0 refills | Status: DC
  Filled 2024-01-17: qty 3, 1d supply, fill #0

## 2024-01-17 MED ORDER — DIPHENHYDRAMINE HCL 25 MG PO TABS
ORAL_TABLET | ORAL | 0 refills | Status: DC
  Filled 2024-01-17: qty 2, 1d supply, fill #0

## 2024-01-17 NOTE — Patient Instructions (Addendum)
 Medication Instructions:   STOP Clonidine    Continue Bystolic  20mg  daily  START Entresto 49-51mg  twice per day This is to help your heart muscle get stronger, lower your blood pressure, and also protects your kidneys from the effects of high blood pressure  CONTINUE Hydralazine  10mg  twice per day May take additional 10mg  as needed for systolic blood pressure more than 160  *If you need a refill on your cardiac medications before your next appointment, please call your pharmacy*  Lab Work: Your physician recommends that you return for lab work in 1-2 weeks: BMP, BNP  If you have labs (blood work) drawn today and your tests are completely normal, you will receive your results only by: MyChart Message (if you have MyChart) OR A paper copy in the mail If you have any lab test that is abnormal or we need to change your treatment, we will call you to review the results.  Testing/Procedures: Your provider has recommended a cardiac CTA---SEE INSTRUCTIONS BELOW TO REFER TO PRIOR TO HAVING THIS SCAN DONE   Follow-Up:  Your next appointment:   1 month(s) with Dr. Raford, Reche GORMAN Finder, NP, or Allean Mink, [facility] in Hypertension Clinic We have also referred you to the Advanced Heart Failure Clinic    Other Instructions  Potential causes of your heart failure include your Trastuzumab , uncontrolled blood pressure, or possible coronary artery plaque. We have ordered coronary CTA to evaluate your heart arteries for plaque build up.   We have referred you to the Advanced Heart Failure Clinic at [facility] for further evaluation of your heart failure.  Recommend limiting to 64 oz (2L) of fluid intake per day.   Recommend low sodium diet, limiting to less than 2,000mg  of sodium per day.     Your cardiac CT will be scheduled at one of the below locations:   [facility] [address] [place], [place] [address] [phone] (Severe contrast allergies  only)    If scheduled at [facility], please arrive at the [facility] and Children's Entrance (Entrance C2) of [facility] 30 minutes prior to test start time. You can use the FREE valet parking offered at entrance C (encouraged to control the heart rate for the test)  Proceed to the [facility] Radiology Department (first floor) to check-in and test prep.  All radiology patients and guests should use entrance C2 at [facility], accessed from [facility], even though the hospital's physical address listed is [address].   Please follow these instructions carefully (unless otherwise directed):  An IV will be required for this test and Nitroglycerin will be given.  Hold all erectile dysfunction medications at least 3 days (72 hrs) prior to test. (Ie viagra, cialis, sildenafil, tadalafil, etc)   On the Night Before the Test: Be sure to Drink plenty of water. Do not consume any caffeinated/decaffeinated beverages or chocolate 12 hours prior to your test. Do not take any antihistamines 12 hours prior to your test.  If the patient has contrast allergy: Patient will need a prescription for Prednisone  and very clear instructions (as follows): Prednisone  50 mg - take 13 hours prior to test Take another Prednisone  50 mg 7 hours prior to test Take another Prednisone  50 mg 1 hour prior to test Take Benadryl  50 mg 1 hour prior to test Patient must complete all four doses of above prophylactic medications. Patient will need a ride after test due to Benadryl .  On the Day of  the Test: Drink plenty of water until 1 hour prior to the test. Do not eat any food 1 hour prior to test. You may take your regular medications prior to the test.  Take BOTH Nebivolol  20 mg (Bystolic ) AND Ivabradine (Corlanor) 15 mg by mouth 2 hours prior to your CT SCAN.  Patients who wear a continuous glucose monitor MUST remove the device prior to scanning. FEMALES- please wear  underwire-free bra if available, avoid dresses & tight clothing       After the Test: Drink plenty of water. After receiving IV contrast, you may experience a mild flushed feeling. This is normal. On occasion, you may experience a mild rash up to 24 hours after the test. This is not dangerous. If this occurs, you can take Benadryl  25 mg, Zyrtec, Claritin, or Allegra and increase your fluid intake. (Patients taking Tikosyn should avoid Benadryl , and may take Zyrtec, Claritin, or Allegra) If you experience trouble breathing, this can be serious. If it is severe call 911 IMMEDIATELY. If it is mild, please call our office.  We will call to schedule your test 2-4 weeks out understanding that some insurance companies will need an authorization prior to the service being performed.   For more information and frequently asked questions, please visit our website : [email or web address]  For non-scheduling related questions, please contact the cardiac imaging nurse navigator should you have any questions/concerns: Cardiac Imaging Nurse Navigators Direct Office Dial: [phone]   For scheduling needs, including cancellations and rescheduling, please call Brittany, [phone].

## 2024-01-18 ENCOUNTER — Other Ambulatory Visit (HOSPITAL_BASED_OUTPATIENT_CLINIC_OR_DEPARTMENT_OTHER): Payer: Self-pay

## 2024-01-18 DIAGNOSIS — F4322 Adjustment disorder with anxiety: Secondary | ICD-10-CM | POA: Diagnosis not present

## 2024-01-19 ENCOUNTER — Other Ambulatory Visit (HOSPITAL_COMMUNITY): Payer: Self-pay

## 2024-01-25 DIAGNOSIS — F4322 Adjustment disorder with anxiety: Secondary | ICD-10-CM | POA: Diagnosis not present

## 2024-01-26 DIAGNOSIS — I5022 Chronic systolic (congestive) heart failure: Secondary | ICD-10-CM | POA: Diagnosis not present

## 2024-01-26 LAB — BASIC METABOLIC PANEL WITH GFR
BUN/Creatinine Ratio: 14 (ref 12–28)
BUN: 15 mg/dL (ref 8–27)
CO2: 24 mmol/L (ref 20–29)
Calcium: 9.7 mg/dL (ref 8.7–10.3)
Chloride: 104 mmol/L (ref 96–106)
Creatinine, Ser: 1.1 mg/dL — ABNORMAL HIGH (ref 0.57–1.00)
Glucose: 94 mg/dL (ref 70–99)
Potassium: 4.3 mmol/L (ref 3.5–5.2)
Sodium: 141 mmol/L (ref 134–144)
eGFR: 53 mL/min/1.73 — ABNORMAL LOW (ref >59)

## 2024-01-27 ENCOUNTER — Ambulatory Visit (HOSPITAL_BASED_OUTPATIENT_CLINIC_OR_DEPARTMENT_OTHER): Payer: Self-pay | Admitting: Family

## 2024-01-27 DIAGNOSIS — I77810 Thoracic aortic ectasia: Secondary | ICD-10-CM

## 2024-01-31 DIAGNOSIS — F4322 Adjustment disorder with anxiety: Secondary | ICD-10-CM | POA: Diagnosis not present

## 2024-02-02 ENCOUNTER — Encounter (HOSPITAL_BASED_OUTPATIENT_CLINIC_OR_DEPARTMENT_OTHER): Admitting: Family

## 2024-02-06 ENCOUNTER — Encounter (HOSPITAL_COMMUNITY): Payer: Self-pay

## 2024-02-07 ENCOUNTER — Telehealth (HOSPITAL_COMMUNITY): Payer: Self-pay | Admitting: Emergency Medicine

## 2024-02-07 NOTE — Telephone Encounter (Signed)
 Attempted to call patient regarding upcoming cardiac CT appointment. Left message on voicemail with name and callback number Rockwell Alexandria RN Navigator Cardiac Imaging [facility] [phone] Office [phone] Cell

## 2024-02-08 ENCOUNTER — Ambulatory Visit (HOSPITAL_COMMUNITY)
Admission: RE | Admit: 2024-02-08 | Discharge: 2024-02-08 | Disposition: A | Discharge status: Home / Self Care | Source: Ambulatory Visit | Attending: Cardiology | Admitting: Cardiology

## 2024-02-08 DIAGNOSIS — I447 Left bundle-branch block, unspecified: Secondary | ICD-10-CM | POA: Insufficient documentation

## 2024-02-08 DIAGNOSIS — R072 Precordial pain: Secondary | ICD-10-CM | POA: Diagnosis not present

## 2024-02-08 DIAGNOSIS — I5022 Chronic systolic (congestive) heart failure: Secondary | ICD-10-CM | POA: Insufficient documentation

## 2024-02-08 DIAGNOSIS — I10 Essential (primary) hypertension: Secondary | ICD-10-CM | POA: Insufficient documentation

## 2024-02-08 MED ORDER — NITROGLYCERIN 0.4 MG SL SUBL
0.8000 mg | SUBLINGUAL_TABLET | Freq: Once | SUBLINGUAL | Status: AC
  Administered 2024-02-08: 0.8 mg via SUBLINGUAL

## 2024-02-08 MED ORDER — IOHEXOL 350 MG/ML SOLN
100.0000 mL | Freq: Once | INTRAVENOUS | Status: AC | PRN
  Administered 2024-02-08: 100 mL via INTRAVENOUS

## 2024-02-13 NOTE — Telephone Encounter (Signed)
 Results were sent to the pts active mychart account to review by Reche Finder, NP.   Will monitor for review.   Will go ahead and place the CT AORTA in one year in the system and have our scheduling dept reach out to her closer to that time frame to arrange that appt.   Pre-med for contrast allergy will need to be called in for this pt to take prior to one year CT ANGIO AORTA, and our office will send this script in for the pt, a little closer to that time frame.    Will endorse this to the pt as well.

## 2024-02-13 NOTE — Telephone Encounter (Signed)
-----   Message from Reche GORMAN Finder sent at 02/13/2024  8:21 AM EST ----- Cardiac CTA with calcium  score of 97 placing her in the 74th percentile for age/sex/race. Overall minimal 1-24% nonobstructive disease in the LAD, LCx, RCA.   Mild dilation of ascending aorta 43mm. Overall similar to previous (12/14/23 CT chest 4.2 cm). Recommend CT Aorta in 1 year for monitoring.   Dilation of pulmonary artery noted.   Not significant enough to cause HFrEF. Recommend GDMT for CAD. Continue Bystolic  20mg  daily. Will discuss statin at upcoming OV 02/15/24.   ----- Message ----- From: Interface, Rad Results In Sent: 02/10/2024   8:49 AM EST To: Reche GORMAN Finder, NP

## 2024-02-14 DIAGNOSIS — F4322 Adjustment disorder with anxiety: Secondary | ICD-10-CM | POA: Diagnosis not present

## 2024-02-15 ENCOUNTER — Other Ambulatory Visit (HOSPITAL_BASED_OUTPATIENT_CLINIC_OR_DEPARTMENT_OTHER): Payer: Self-pay

## 2024-02-15 ENCOUNTER — Encounter (HOSPITAL_BASED_OUTPATIENT_CLINIC_OR_DEPARTMENT_OTHER): Payer: Self-pay | Admitting: Family

## 2024-02-15 ENCOUNTER — Ambulatory Visit (INDEPENDENT_AMBULATORY_CARE_PROVIDER_SITE_OTHER): Admitting: Family

## 2024-02-15 VITALS — BP 150/82 | HR 82 | Ht 61.05 in | Wt 166.9 lb

## 2024-02-15 DIAGNOSIS — I5020 Unspecified systolic (congestive) heart failure: Secondary | ICD-10-CM

## 2024-02-15 DIAGNOSIS — E785 Hyperlipidemia, unspecified: Secondary | ICD-10-CM

## 2024-02-15 DIAGNOSIS — I2510 Atherosclerotic heart disease of native coronary artery without angina pectoris: Secondary | ICD-10-CM

## 2024-02-15 DIAGNOSIS — Z8673 Personal history of transient ischemic attack (TIA), and cerebral infarction without residual deficits: Secondary | ICD-10-CM | POA: Diagnosis not present

## 2024-02-15 DIAGNOSIS — I10 Essential (primary) hypertension: Secondary | ICD-10-CM

## 2024-02-15 DIAGNOSIS — I77810 Thoracic aortic ectasia: Secondary | ICD-10-CM | POA: Diagnosis not present

## 2024-02-15 MED ORDER — COMFORT TOUCH BP CUFF/LARGE MISC: 1.0000 [IU] | Freq: Once | 0 refills | Status: AC

## 2024-02-15 MED ORDER — COMFORT TOUCH BP CUFF/LARGE MISC
1.0000 [IU] | Freq: Once | 0 refills | Status: DC
  Filled 2024-02-15: qty 1, fill #0

## 2024-02-15 MED ORDER — SACUBITRIL-VALSARTAN 97-103 MG PO TABS: 1.0000 | ORAL_TABLET | Freq: Two times a day (BID) | ORAL | 5 refills | Status: AC

## 2024-02-15 MED ORDER — SACUBITRIL-VALSARTAN 97-103 MG PO TABS
1.0000 | ORAL_TABLET | Freq: Two times a day (BID) | ORAL | 5 refills | Status: DC
  Filled 2024-02-15: qty 60, 30d supply, fill #0

## 2024-02-15 NOTE — Progress Notes (Signed)
 Advanced Hypertension Clinic Assessment:    Date:  02/15/2024   ID:  [name], DOB [birth year], MRN [id number]  PCP:  Patient, No Pcp Per  Cardiologist:  Lynwood Schilling, MD  Nephrologist:  Referring MD: No ref. provider found   CC: Hypertension  History of Present Illness:    [name] is a 73 y.o. female with a hx of hypertension, LBBB, PVC, CAD, TIA, CVA here to establish care in the [facility].   Multiple medication intolerances including carvedilol , amlodipine , spironolactone , hydralazine .  She tolerates brand-name Bystolic .  05/10/23 she had endometrial biopsy indicating high grad serous endometrial adenocaarcinoma, p53 mutated. 07/05/23 underwent robotic assisted hysterectomy. Final pathology revealed serous carcinoma of the endometrium, 5.2cm with 1mm of 10mm myo invasion (no LVSI), negative adnexa and cervix, with a 2cm left external iliac macrometastis.  She subsequently 08/09/23 started chemotherapy regimen of carboplatin  AUC 6, paclitaxel  135 mg/m2, trastuzumab . However, on 09/02/23 paclitaxel  caused intense itching and was changed to abraxane  200mg /m2.   Established with [facility] 09/01/23. [name] was diagnosed with hypertension in her 37s after the passing of her husband. Blood pressure checked with wrist cuff at home.No tobacco nor caffeine use. Was working to reduce salt. She was taking Clonidine  PRN ~ once per week. She declined medication changes and wished to pursue secondary hypertension evaluation. Prior MRA 2018 and imaging 2025 with normal adrenal glands. She reported sleep apnea for which she previously worse nose strip but no prior formal testing. 08/2023 TSH unremarkable. Testing indeterminate for hyperaldosteronism (aldosterone 12.5, renin <0.167, aldos/renin ratio >74.) recommended for salt loading and 24 h urine testing which was not performed.   At visit 09/27/23 she was again hesitant  regarding medication changes though agreeable to Hydralazine  10mg  PRN for SBP >180. At visit 11/10/23 Hydralazine  adjusted to 10mg  BID with additional 10mg  PRN.  Prior echo 07/2023 LVEF 55% (though 58% by 3D measurement), gr2dd, RVSF normal, mild AI (AI peak velocity 5.2 m/s), trivial MR, no PR, mild TR, normal wall motion. Ascending aorta 4.5 cm.   Renal duplex 11/24/23 with no renal artery stenosis.   CT 12/2023 with no metastatic disease. Carboplatin  AUC and aabraxane were discontinued, trastuzumab  maintenance initiated. Most recent cycle of trastuzumab  12/22/23. Echo per [facility] team 12/22/23 LVEF 35% (though 41% by 3D measurement), indeterminate diastolic function, RVSF normal, moderate AI *AI peak velocity 5.5 m/s), trivial MR/PR/TR. Also noted abnormal LV septal wall motion abnormality which could be related to wide QRS, clinical correlation advised.  Ascending aorta 3.7 cm.   At visit with GYN-ONC  team 01/09/24 noted exertional dyspnea but no edema, chest pain. Trastuzumab  placed on hold due to decreased LVEF. Her BP at that clinic visit was 173/97.  She was seen in clinic 02/06/24 with elevated BP 162/102.  Given HFrEF she was set up for cardiac CTA.  She was started on Entresto  49-51 mg twice daily.  She was referred to the heart failure clinic to see Dr. Zenaida for cardio-oncology care.  Cardiac CTA 02/08/2024 calcium  score 97 placing her in the 74th percentile with overall minimal nonobstructive CAD 1-24% stenosis of mid LAD, proximal/mid LCx, mid RCA. Severe dilation of pulmonary artery 35 mm and mild ascending aortic dilation 43 mm with tortuosity and aortic atherosclerosis. Radiology read with no acute findings.  Presents today for follow-up.  Weight loss of 3 pounds since clinic visit 1 month ago. Reports tolerating Entresto  without issue. Dyspnea with more than usual activity, has not significantly increased activity  after completing chemo regimen. No LE edema, orthopnea, PND.  Reviewed CTA. Prior intolerance to atorvastatin  with itching and hesitant regarding cholesterol lowering medications. Reviewed HFrEF and guideline recommendations for multimodal therapy.   Previous antihypertensives: Carvedilol  Amlodipine  - heart to flip, creaking bones Spironolactone  - rash Hydralazine  - felt poorly Losartan  - creaking bones, muscles didn't feel right  Past Medical History:  Diagnosis Date   Arthritis    Cancer (HCC) 05/2023   Chicken pox    Depression    Diverticulitis    Elevated urinary free cortisol level 03/25/2014   mild with low potasssium  endo consult   disc with patient     Hypertension    Sciatica    Seasonal allergies    Stroke (HCC) 09/2008   Thyroid  disease     Past Surgical History:  Procedure Laterality Date   MINOR BREAST BIOPSY  1979   WISDOM TOOTH EXTRACTION      Current Medications: Current Meds  Medication Sig   Ascorbic Acid (VITAMIN C) 1000 MG tablet Take 1,000 mg by mouth every morning.   ASHWAGANDHA PO Take 1 capsule by mouth daily.   B Complex-C (B-COMPLEX WITH VITAMIN C) tablet Take 1 tablet by mouth daily.   BYSTOLIC  20 MG TABS Take 1 tablet (20 mg total) by mouth daily.   Cholecalciferol (VITAMIN D PO) Take 5,000 Units by mouth daily.    Cyanocobalamin (B-12 PO) Take 1 tablet by mouth daily.   hydrALAZINE  (APRESOLINE ) 10 MG tablet Take 1 tablet (10 mg total) by mouth 2 (two) times daily. May take an additional tablet as needed for systolic blood pressure (top number) more than 180.   Moringa Oleifera (MORINGA PO) Take 1 packet by mouth daily.    MULTIPLE VITAMIN PO Take 1 tablet by mouth daily.    NATTOKINASE PO Take 1 tablet by mouth every evening. Helps blood flow   OVER THE COUNTER MEDICATION Take 1 packet by mouth daily. Mushroom supplement   OVER THE COUNTER MEDICATION as directed. Black seed oil   [DISCONTINUED] Blood Pressure Monitoring (COMFORT TOUCH BP CUFF/LARGE) MISC 1 Units by Does not apply route once for 1  dose.   [DISCONTINUED] sacubitril -valsartan  (ENTRESTO ) 49-51 MG Take 1 tablet by mouth 2 (two) times daily.   [DISCONTINUED] sacubitril -valsartan  (ENTRESTO ) 49-51 MG Take 1 tablet by mouth 2 (two) times daily.   [DISCONTINUED] sacubitril -valsartan  (ENTRESTO ) 97-103 MG Take 1 tablet by mouth 2 (two) times daily.     Allergies:   Isovue [iopamidol], Erythromycin base, Iodinated contrast media, Penicillins, and Sulfa antibiotics   Social History   Socioeconomic History   Marital status: Widowed    Spouse name: Not on file   Number of children: Not on file   Years of education: Not on file   Highest education level: Bachelor's degree (e.g., BA, AB, BS)  Occupational History   Not on file  Tobacco Use   Smoking status: Never    Passive exposure: Past (Dad smoked all the time)   Smokeless tobacco: Never  Vaping Use   Vaping status: Not on file  Substance and Sexual Activity   Alcohol use: Never    Comment: Social Use only   Drug use: No   Sexual activity: Not Currently  Other Topics Concern   Not on file  Social History Narrative   Usually sleeps 7 hours per night   Lives with her son 79 y no pets    No pets   BA degree in various grad work  Note about 10 years husband was in the [employer]   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 right now very much   g2p2   Neg tad herbals exercise       Right handed   Social Drivers of Corporate Investment Banker Strain: Medium Risk (12/26/2023)   Overall Financial Resource Strain (CARDIA)    Difficulty of Paying Living Expenses: Somewhat hard  Food Insecurity: Patient Declined (01/17/2024)   Hunger Vital Sign    Worried About Running Out of Food in the Last Year: Patient declined    Ran Out of Food in the Last Year: Patient declined  Transportation Needs: No Transportation Needs (12/26/2023)   PRAPARE - Administrator, Civil Service (Medical): No    Lack of  Transportation (Non-Medical): No  Physical Activity: Inactive (12/26/2023)   Exercise Vital Sign    Days of Exercise per Week: 0 days    Minutes of Exercise per Session: Not on file  Stress: Stress Concern Present (12/26/2023)   [employer] of Occupational Health - Occupational Stress Questionnaire    Feeling of Stress: To some extent  Social Connections: Moderately Isolated (12/26/2023)   Social Connection and Isolation Panel    Frequency of Communication with Friends and Family: Once a week    Frequency of Social Gatherings with Friends and Family: Once a week    Attends Religious Services: More than 4 times per year    Active Member of [employer] or Organizations: Yes    Attends Banker Meetings: More than 4 times per year    Marital Status: Widowed     Family History: The patient's family history includes Asthma in her brother, brother, maternal grandmother, and paternal grandmother; Cancer in her brother; Diabetes in her maternal grandmother and mother; Heart disease in her mother; Heart failure in her mother; Hypertension in her brother, father, mother, sister, and sister.  ROS:   Please see the history of present illness.    All other systems reviewed and are negative.  EKGs/Labs/Other Studies Reviewed:         Recent Labs: 09/01/2023: Magnesium 2.2; TSH 2.490 01/26/2024: BUN 15; Creatinine, Ser 1.10; Potassium 4.3; Sodium 141   Recent Lipid Panel    Component Value Date/Time   CHOL 166 02/20/2020 1206   TRIG 70.0 02/20/2020 1206   HDL 64.30 02/20/2020 1206   CHOLHDL 3 02/20/2020 1206   VLDL 14.0 02/20/2020 1206   LDLCALC 88 02/20/2020 1206    Physical Exam:   VS:  BP (!) 150/82   Pulse 82   Ht 5' 1.05 (1.551 m)   Wt 166 lb 14.4 oz (75.7 kg)   SpO2 98%   BMI 31.48 kg/m  , BMI Body mass index is 31.48 kg/m.  Vitals:   02/15/24 1502  BP: (!) 150/82  Pulse: 82  Height: 5' 1.05 (1.551 m)  Weight: 166 lb 14.4 oz (75.7 kg)  SpO2: 98%  BMI  (Calculated): 31.47   GENERAL:  Well appearing HEENT: Pupils equal round and reactive, fundi not visualized, oral mucosa unremarkable NECK:  No jugular venous distention, waveform within normal limits, carotid upstroke brisk and symmetric, no bruits, no thyromegaly LYMPHATICS:  No cervical adenopathy LUNGS:  Clear to auscultation bilaterally HEART:  RRR.  PMI not displaced or sustained,S1 and S2 within normal limits, no S3, no S4, no clicks, no rubs, no murmurs ABD:  Flat, positive bowel sounds normal in frequency in pitch,  no bruits, no rebound, no guarding, no midline pulsatile mass, no hepatomegaly, no splenomegaly EXT:  2 plus pulses throughout, no edema, no cyanosis no clubbing SKIN:  No rashes no nodules NEURO:  Cranial nerves II through XII grossly intact, motor grossly intact throughout PSYCH:  Cognitively intact, oriented to person place and time   ASSESSMENT/PLAN:    HFrEF - new onset HFrEF echo 07/2023 LVEF 58% ? 12/2023 LVEF 41% by 3D measurement. Consider etiology multifactorial Trastuzumab  and uncontrolled hypertension. Cardiac CTA 02/08/2024 with minimal nonobstructive CAD 1-24% stenosis of LAD, LCx, RCA.  Not significant enough to cause HFrEF. Medical therapy Continue Bystolic  20mg  daily Increase Entresto  to 97-103mg  BID Defer loop diuretic as euvolemic on exam Of note, previously did not tolerate Spironolactone  with rash. Could consider trial of Eplerenone.  Consider SGLT2i at follow up - she declines to add to regimen today.  CMET in 1-2 weeks Has been referred to Advanced Heart Failure team for cardio-onc consult.  Pulmonary artery dilation noted on CCTA 35 mm.  Consider workup for PAH with Advanced Heart Failure team.  HTN - Multiple prior medication intolerances. Has been historically hesitant regarding medication changes.  As above, continue Bystolic  20mg  daily, Hydralazine  10mg  BID (additional 10mg  PRN for SBP >180), and increase to Entresto  97-103mg  BID Discussed  to monitor BP at home at least 2 hours after medications and sitting for 5-10 minutes.  Secondary workup Imaging 2025 normal adrenals 08/2023 normal TSH Labs indeterminate for hyperaldosteronism. With elevated BP, hesitant to pursue sodium loading for confirmatory testing. Consider future trial of Eplerenone, prior intolerance ot Spironolactone  with rash though does not recall severity.  11/2023 renal artery duplex with on stenosis Notes sleeping overall well,  previously opted to defer sleep study.  Nonobstructive CAD/HLD, LDL less than 70- CCTA 02/08/24 minimal nonobstructive CAD. No anginal symptoms.  Not fasting today, lipid panel and CMET in 1-2 weeks Prior intolerance to Atorvastatin  with itching. She is very hesitant regarding medication changes. Could consider low dose statin such as Rosuvastatin 3x per week based on lipid results. Anticipate she may defer lipid lowering therapy, requests to review labs first. Recommend aiming for 150 minutes of moderate intensity activity per week and following a heart healthy diet.    LBBB - Monitor with periodic EKG. No near syncope, syncope. If HF persists, could consider CRT-D.  Ascending aortic dilation - noted by prior imaging. Contrast allergy precludes CT. MRI 03/30/16 negative for significant aneurysm or dissection. Echo 07/2023 ascending aorta 4.5cm. Repeat echo 11/2023 ascending aorta 3.7 cm.  CTA 02/08/2024 ascending aorta 43 mm.Repeat CT aorta in 1 year already ordered. Continue beta blocker and BP control, as above.   Hx of CVA - Continue to follow with PCP.   Screening for Secondary Hypertension:     Relevant Labs/Studies:    Latest Ref Rng & Units 01/26/2024    3:03 PM 05/25/2021    3:18 PM 02/20/2020   12:06 PM  Basic Labs  Sodium 134 - 144 mmol/L 141  139  138   Potassium 3.5 - 5.2 mmol/L 4.3  3.5  4.1   Creatinine 0.57 - 1.00 mg/dL 8.89  8.87  8.93        Latest Ref Rng & Units 09/01/2023    3:12 PM 01/02/2018    9:49 AM   Thyroid    TSH 0.450 - 4.500 uIU/mL 2.490  4.12        Latest Ref Rng & Units 09/01/2023    3:12 PM  Renin/Aldosterone  Aldosterone 0.0 - 30.0 ng/dL 87.4   Aldos/Renin Ratio 0.0 - 30.0 >74.9              11/24/2023   10:00 AM  Renovascular   Renal Artery US  Completed Yes     Disposition:    FU with MD/APP/PharmD in 1 months    Medication Adjustments/Labs and Tests Ordered: Current medicines are reviewed at length with the patient today.  Concerns regarding medicines are outlined above.  Orders Placed This Encounter  Procedures   Comprehensive metabolic panel with GFR   Lipid panel   Meds ordered this encounter  Medications   DISCONTD: Blood Pressure Monitoring (COMFORT TOUCH BP CUFF/LARGE) MISC    Sig: 1 Units by Does not apply route once for 1 dose.    Dispense:  1 each    Refill:  0    Supervising Provider:   LONNI SLAIN [[id number]]   DISCONTD: sacubitril -valsartan  (ENTRESTO ) 97-103 MG    Sig: Take 1 tablet by mouth 2 (two) times daily.    Dispense:  60 tablet    Refill:  5    Discontinue 49-51mg  tablet    Supervising Provider:   CHRISTOPHER, BRIDGETTE [[id number]]   sacubitril -valsartan  (ENTRESTO ) 97-103 MG    Sig: Take 1 tablet by mouth 2 (two) times daily.    Dispense:  60 tablet    Refill:  5    Discontinue 49-51mg  tablet    Supervising Provider:   LONNI SLAIN [[id number]]   Blood Pressure Monitoring (COMFORT TOUCH BP CUFF/LARGE) MISC    Sig: 1 Units by Does not apply route once for 1 dose.    Dispense:  1 each    Refill:  0    Supervising Provider:   LONNI SLAIN [[id number]]     Signed, Reche GORMAN Finder, NP  02/15/2024 4:29 PM    [facility]

## 2024-02-15 NOTE — Patient Instructions (Signed)
 Medication Instructions:   CHANGE Entresto  to 97-103mg  twice per day This will help to strengthen your heart muscle function and control blood pressure  *If you need a refill on your cardiac medications before your next appointment, please call your pharmacy*  Lab Work: Your physician recommends that you return for lab work in 1-2 weeks for fasting lipid panel and CMET  Your provider has recommended lab work. Please have this collected at [facility] [place]. The lab is open 8:00 am - 4:30 pm. Please avoid 12:00p - 1:00p for lunch hour. You do not need an appointment. Please go to [address] [place], [place] [address]. This is in the Primary Care office on the 3rd floor, let them know you are there for blood work and they will direct you to the lab.   If you have labs (blood work) drawn today and your tests are completely normal, you will receive your results only by: MyChart Message (if you have MyChart) OR A paper copy in the mail If you have any lab test that is abnormal or we need to change your treatment, we will call you to review the results.  Testing/Procedures: Your CT scan showed minimal nonobstructive plaque build up in your heart arteries. There was less than 25% blockage. We prevent this from worsening by keeping cholesterol controlled, following a heart healthy diet, and  exercising regularly.  Follow-Up: At [facility], you and your health needs are our priority.  As part of our continuing mission to provide you with exceptional heart care, our providers are all part of one team.  This team includes your primary Cardiologist (physician) and Advanced Practice Providers or APPs (Physician Assistants and Nurse Practitioners) who all work together to provide you with the care you need, when you need it.  Your next appointment:   2 month(s) in Advanced Hypertension Clinic   We recommend signing up for the patient portal called MyChart.   Sign up information is provided on this After Visit Summary.  MyChart is used to connect with patients for Virtual Visits (Telemedicine).  Patients are able to view lab/test results, encounter notes, upcoming appointments, etc.  Non-urgent messages can be sent to your provider as well.   To learn more about what you can do with MyChart, go to [email or web address].   Other Instructions  We have sent a prescription for BP cuff to [facility] pharmacy to see if your insurance covers.  Recommend checking BP once per day at home and keeping a log.

## 2024-02-21 ENCOUNTER — Encounter (HOSPITAL_BASED_OUTPATIENT_CLINIC_OR_DEPARTMENT_OTHER): Payer: Self-pay

## 2024-02-21 DIAGNOSIS — F4322 Adjustment disorder with anxiety: Secondary | ICD-10-CM | POA: Diagnosis not present

## 2024-03-01 LAB — COMPREHENSIVE METABOLIC PANEL WITH GFR
ALT: 11 IU/L (ref 0–32)
AST: 26 IU/L (ref 0–40)
Albumin: 4.3 g/dL (ref 3.8–4.8)
Alkaline Phosphatase: 51 IU/L (ref 49–135)
BUN/Creatinine Ratio: 11 — ABNORMAL LOW (ref 12–28)
BUN: 13 mg/dL (ref 8–27)
Bilirubin Total: 1.3 mg/dL — ABNORMAL HIGH (ref 0.0–1.2)
CO2: 24 mmol/L (ref 20–29)
Calcium: 9.5 mg/dL (ref 8.7–10.3)
Chloride: 103 mmol/L (ref 96–106)
Creatinine, Ser: 1.18 mg/dL — ABNORMAL HIGH (ref 0.57–1.00)
Globulin, Total: 2.3 g/dL (ref 1.5–4.5)
Glucose: 94 mg/dL (ref 70–99)
Potassium: 4.2 mmol/L (ref 3.5–5.2)
Sodium: 140 mmol/L (ref 134–144)
Total Protein: 6.6 g/dL (ref 6.0–8.5)
eGFR: 49 mL/min/1.73 — ABNORMAL LOW (ref >59)

## 2024-03-01 LAB — LIPID PANEL
Chol/HDL Ratio: 3.3 ratio (ref 0.0–4.4)
Cholesterol, Total: 225 mg/dL — ABNORMAL HIGH (ref 100–199)
HDL: 69 mg/dL (ref >39)
LDL Chol Calc (NIH): 139 mg/dL — ABNORMAL HIGH (ref 0–99)
Triglycerides: 95 mg/dL (ref 0–149)
VLDL Cholesterol Cal: 17 mg/dL (ref 5–40)

## 2024-03-02 ENCOUNTER — Ambulatory Visit (HOSPITAL_BASED_OUTPATIENT_CLINIC_OR_DEPARTMENT_OTHER): Payer: Self-pay | Admitting: Family

## 2024-03-02 DIAGNOSIS — E785 Hyperlipidemia, unspecified: Secondary | ICD-10-CM

## 2024-03-02 DIAGNOSIS — Z79899 Other long term (current) drug therapy: Secondary | ICD-10-CM

## 2024-03-02 MED ORDER — ROSUVASTATIN CALCIUM 10 MG PO TABS: 10.0000 mg | ORAL_TABLET | ORAL | 1 refills | Status: AC

## 2024-03-02 NOTE — Telephone Encounter (Signed)
 The patient has been notified of the result and verbalized understanding.  All questions (if any) were answered.  Pt states she would like to start out taking crestor 10 mg po three times weekly and have her repeat lipids/ALT done in 2-3 months to reassess.   Confirmed the pharmacy of choice with the pt.   Pt aware to come in for repeat lipids/ALT in 2-3 months and to write this down on her calendar.  Pt aware to come fasting to that lab appt.  Pt verbalized understanding and agrees with this plan.

## 2024-03-02 NOTE — Telephone Encounter (Signed)
-----   Message from Reche Finder, NP sent at 03/02/2024  8:44 AM EST ----- Stable kidney function. Normal liver. Cholesterol not at goal. LDL (bad cholesterol)  goal <70 and on labs it was 139. Recommend start Rosuvastatin  10mg  daily. If she is hesitant, may start at  Rosuvastatin  10mg  three times per week. This is a low dose of a cholesterol lowering medication to help reduce cardiovascular risk. Repeat FLP/ALT in 2-3 months.

## 2024-03-06 ENCOUNTER — Telehealth (HOSPITAL_COMMUNITY): Payer: Self-pay | Admitting: Vascular Surgery

## 2024-03-13 ENCOUNTER — Encounter: Payer: Self-pay | Admitting: Family Medicine

## 2024-03-13 ENCOUNTER — Ambulatory Visit (INDEPENDENT_AMBULATORY_CARE_PROVIDER_SITE_OTHER): Admitting: Family Medicine

## 2024-03-13 VITALS — BP 148/96 | HR 80 | Temp 98.4°F | Ht 61.5 in | Wt 163.0 lb

## 2024-03-13 DIAGNOSIS — E559 Vitamin D deficiency, unspecified: Secondary | ICD-10-CM

## 2024-03-13 DIAGNOSIS — G8929 Other chronic pain: Secondary | ICD-10-CM

## 2024-03-13 DIAGNOSIS — Z79899 Other long term (current) drug therapy: Secondary | ICD-10-CM

## 2024-03-13 DIAGNOSIS — I5020 Unspecified systolic (congestive) heart failure: Secondary | ICD-10-CM

## 2024-03-13 DIAGNOSIS — E063 Autoimmune thyroiditis: Secondary | ICD-10-CM | POA: Diagnosis not present

## 2024-03-13 DIAGNOSIS — M545 Low back pain, unspecified: Secondary | ICD-10-CM | POA: Diagnosis not present

## 2024-03-13 DIAGNOSIS — Z8542 Personal history of malignant neoplasm of other parts of uterus: Secondary | ICD-10-CM

## 2024-03-13 NOTE — Progress Notes (Unsigned)
 PHYSICAL THERAPY - [facility]WE[facility]  SOURSAP - VITAMIN

## 2024-03-14 LAB — CBC WITH DIFFERENTIAL/PLATELET
Basophils Absolute: 0.1 K/uL (ref 0.0–0.1)
Basophils Relative: 1 % (ref 0.0–3.0)
Eosinophils Absolute: 0.2 K/uL (ref 0.0–0.7)
Eosinophils Relative: 3.8 % (ref 0.0–5.0)
HCT: 40.7 % (ref 36.0–46.0)
Hemoglobin: 13.6 g/dL (ref 12.0–15.0)
Lymphocytes Relative: 32.4 % (ref 12.0–46.0)
Lymphs Abs: 2.1 K/uL (ref 0.7–4.0)
MCHC: 33.4 g/dL (ref 30.0–36.0)
MCV: 98.6 fl (ref 78.0–100.0)
Monocytes Absolute: 0.6 K/uL (ref 0.1–1.0)
Monocytes Relative: 8.8 % (ref 3.0–12.0)
Neutro Abs: 3.4 K/uL (ref 1.4–7.7)
Neutrophils Relative %: 54 % (ref 43.0–77.0)
Platelets: 139 K/uL — ABNORMAL LOW (ref 150.0–400.0)
RBC: 4.13 Mil/uL (ref 3.87–5.11)
RDW: 13.1 % (ref 11.5–15.5)
WBC: 6.3 K/uL (ref 4.0–10.5)

## 2024-03-14 LAB — COMPREHENSIVE METABOLIC PANEL WITH GFR
ALT: 13 U/L (ref 3–35)
AST: 25 U/L (ref 5–37)
Albumin: 4.1 g/dL (ref 3.5–5.2)
Alkaline Phosphatase: 46 U/L (ref 39–117)
BUN: 19 mg/dL (ref 6–23)
CO2: 30 meq/L (ref 19–32)
Calcium: 9.5 mg/dL (ref 8.4–10.5)
Chloride: 104 meq/L (ref 96–112)
Creatinine, Ser: 1.22 mg/dL — ABNORMAL HIGH (ref 0.40–1.20)
GFR: 44.24 mL/min — ABNORMAL LOW
Glucose, Bld: 90 mg/dL (ref 70–99)
Potassium: 4.2 meq/L (ref 3.5–5.1)
Sodium: 141 meq/L (ref 135–145)
Total Bilirubin: 1 mg/dL (ref 0.2–1.2)
Total Protein: 7 g/dL (ref 6.0–8.3)

## 2024-03-14 LAB — VITAMIN B12: Vitamin B-12: 810 pg/mL (ref 211–911)

## 2024-03-14 LAB — MAGNESIUM: Magnesium: 2.2 mg/dL (ref 1.5–2.5)

## 2024-03-14 LAB — VITAMIN D 25 HYDROXY (VIT D DEFICIENCY, FRACTURES): VITD: 67.95 ng/mL (ref 30.00–100.00)

## 2024-03-14 LAB — TSH: TSH: 5.12 u[IU]/mL (ref 0.35–5.50)

## 2024-03-26 ENCOUNTER — Telehealth (HOSPITAL_COMMUNITY): Payer: Self-pay | Admitting: Cardiology

## 2024-03-26 NOTE — Telephone Encounter (Signed)
 Called to confirm/remind patient of their appointment at the Advanced Heart Failure Clinic on 03/26/24.   Appointment:   [] Confirmed  [] Left mess   [x] No answer/No voice mail  [] VM Full/unable to leave message  [] Phone not in service  Patient reminded to bring all medications and/or complete list.  Confirmed patient has transportation. Gave directions, instructed to utilize valet parking.

## 2024-03-27 ENCOUNTER — Ambulatory Visit (HOSPITAL_COMMUNITY): Admitting: Cardiology

## 2024-03-27 NOTE — Progress Notes (Incomplete)
" ° °  ADVANCED HEART FAILURE FOLLOW UP CLINIC NOTE  Referring Physician: Vannie Reche RAMAN, NP  Primary Care: Prentiss Frieze, DO Primary Cardiologist:  HPI: [name] is a 74 y.o. female who presents for follow up of ***.      {Anything typed between these two boxes will persist and can be pulled forward to future notes. This phrase will delete itself when the note is signed :1}   @HCHISTORYNARRATIVE @     SUBJECTIVE:  PMH, current medications, allergies, social history, and family history reviewed in epic.  PHYSICAL EXAM: There were no vitals filed for this visit. GENERAL: Well nourished and in no apparent distress at rest.  PULM:  Normal work of breathing, clear to auscultation bilaterally. Respirations are unlabored.  CARDIAC:  JVP: ***         Normal rate with regular rhythm. No murmurs, rubs or gallops.  *** edema. Warm and well perfused extremities. ABDOMEN: Soft, non-tender, non-distended. NEUROLOGIC: Patient is oriented x3 with no focal or lateralizing neurologic deficits.    DATA REVIEW  ECG: ***    ECHO: ***   CATH: ***   Heart failure review: - Classification: {HFCLASS:30917} - Etiology: {Cardiomyopathy:30918} - NYHA Class:  - Volume status: {volumechf:30919} - ACEi/ARB/ARNI: {HF:30752} - Aldosterone antagonist: {HF:30752} - Beta-blocker: {HF:30752} - Digoxin: {HF:30752} - Hydralazine /Nitrates: {HF:30752} - SGLT2i: {HF:30752} - GLP-1: {GLP:30906} - Advanced therapies: {Advancedtherapies:30916} - ICD: {ICD:30901}  ASSESSMENT & PLAN:  ***  Follow up in ***  Morene Brownie, MD Advanced Heart Failure Mechanical Circulatory Support 03/27/2024 "

## 2024-03-30 ENCOUNTER — Ambulatory Visit: Payer: Self-pay | Admitting: Family Medicine

## 2024-04-05 ENCOUNTER — Other Ambulatory Visit: Payer: Self-pay

## 2024-04-05 ENCOUNTER — Ambulatory Visit: Attending: Family Medicine | Admitting: Physical Therapy

## 2024-04-05 ENCOUNTER — Encounter: Payer: Self-pay | Admitting: Physical Therapy

## 2024-04-05 DIAGNOSIS — M62830 Muscle spasm of back: Secondary | ICD-10-CM | POA: Insufficient documentation

## 2024-04-05 DIAGNOSIS — G8929 Other chronic pain: Secondary | ICD-10-CM | POA: Diagnosis not present

## 2024-04-05 DIAGNOSIS — R262 Difficulty in walking, not elsewhere classified: Secondary | ICD-10-CM | POA: Insufficient documentation

## 2024-04-05 DIAGNOSIS — M545 Low back pain, unspecified: Secondary | ICD-10-CM | POA: Diagnosis not present

## 2024-04-05 DIAGNOSIS — M5459 Other low back pain: Secondary | ICD-10-CM | POA: Diagnosis present

## 2024-04-05 NOTE — Therapy (Signed)
 " OUTPATIENT PHYSICAL THERAPY THORACOLUMBAR EVALUATION   Patient Name: [name] MRN: [id number] DOB:Sep 29, 1950, 74 y.o., female Today's Date: 04/05/2024  END OF SESSION:  PT End of Session - 04/05/24 1358     Visit Number 1    Date for Recertification  07/04/24    Authorization Type Medicare    PT Start Time 1358    PT Stop Time 1440    PT Time Calculation (min) 42 min    Activity Tolerance Patient tolerated treatment well    Behavior During Therapy [facility] for tasks assessed/performed          Past Medical History:  Diagnosis Date   Arthritis    Cancer (HCC) 05/2023   Chicken pox    Depression    Diverticulitis    Elevated urinary free cortisol level 03/25/2014   mild with low potasssium  endo consult   disc with patient     Hypertension    Sciatica    Seasonal allergies    Stroke (HCC) 09/2008   Thyroid  disease    Past Surgical History:  Procedure Laterality Date   MINOR BREAST BIOPSY  1979   WISDOM TOOTH EXTRACTION     Patient Active Problem List   Diagnosis Date Noted   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) 07/29/2023   Endometrial cancer (HCC) 06/03/2023   History of endometrial cancer 05/14/2023   Fibrocystic disease of breast 05/10/2023   Aneurysm of ascending aorta without rupture 11/16/2021   Stroke (HCC) 09/03/2019   Influenza vaccination declined 11/17/2016   TIA (transient ischemic attack) 09/28/2016   Right sided weakness 09/28/2016   CAD (coronary artery disease) 09/25/2014   Pulse slow 04/26/2014   Hypertensive heart disease without heart failure 12/22/2013   Essential hypertension 12/21/2013   PVC (premature ventricular contraction) 09/11/2013   Left bundle branch block 07/25/2013   Right thyroid  nodule 07/18/2013   Fatigue 07/18/2013   Autoimmune thyroiditis 06/24/2013   Goiter 06/19/2013   Left bundle branch block (LBBB) on electrocardiogram 06/19/2013   Snoring 06/19/2013   Hypertensive urgency 05/23/2013    History of CVA (cerebrovascular accident) 05/23/2013   Dyslipidemia 10/24/2009   AMI, NON-Q WAVE, INITIAL EPISODE 12/04/2008   H/O acute myocardial infarction 12/04/2008   Allergic rhinitis 12/02/2008    PCP: Geni Shutter, DO  REFERRING PROVIDER: Shutter, DO  REFERRING DIAG: LBP  Rationale for Evaluation and Treatment: Rehabilitation  THERAPY DIAG:  Other low back pain  Muscle spasm of back  Difficulty in walking, not elsewhere classified  ONSET DATE: 03/13/24  SUBJECTIVE:  SUBJECTIVE STATEMENT: Patient reports that she had surgery Jul 29, 2023, for endometrial CA, she reports that she underwent chemo and has finished this treatment, she reports that she feels that over the past year she has had issues with legs being weak, she was also having some thigh pain bilaterally.  PERTINENT HISTORY:  Arthritis, depression, HTN, sciatica, CVA, endometrial CA just finished chemo, CHF  PAIN:  Are you having pain? Yes: NPRS scale: 2-3/10 Pain location: legs and back Pain description: ache, burn, sharp Aggravating factors: weather, up on feet, pain up to 8/10 Relieving factors: movements, heat at best pain is a 2/10  PRECAUTIONS: None  RED FLAGS: None   WEIGHT BEARING RESTRICTIONS: No  FALLS:  Has patient fallen in last 6 months? No  LIVING ENVIRONMENT: Lives with: lives with their family Lives in: House/apartment Stairs: Yes: Internal: 16 steps; can reach both Has following equipment at home: Single point cane  OCCUPATION: health and wellness coach  PLOF: Independent and did housework some patio gardening  PATIENT GOALS: no pain, feel stronger, walk better, walk a mile  NEXT MD VISIT: 3 months  OBJECTIVE:  Note: Objective measures were completed at Evaluation unless otherwise  noted.  DIAGNOSTIC FINDINGS:  MRI negative for any mets  COGNITION: Overall cognitive status: Within functional limits for tasks assessed     SENSATION: WFL Hands and feet are cold , feels like rope is on the bottom of the feet since chemo  MUSCLE LENGTH: HS 45 degree SLR, tight nad tender piriformis, very tight adductors  POSTURE: rounded shoulders, forward head, and stooped posture  PALPATION: Tight in the low back, , tender in the thighs  LUMBAR ROM:   AROM eval  Flexion Decreased 25%  Extension Decreased 100%  Right lateral flexion Decreased 75%  Left lateral flexion Decreased 75%  Right rotation   Left rotation    (Blank rows = not tested)  LOWER EXTREMITY ROM:   WFL's  LOWER EXTREMITY MMT:    MMT Right eval Left eval  Hip flexion 4- 4-  Hip extension    Hip abduction 4- 4-  Hip adduction 4 4  Hip internal rotation    Hip external rotation    Knee flexion 4 4  Knee extension 4 4  Ankle dorsiflexion 4- 4-  Ankle plantarflexion    Ankle inversion    Ankle eversion     (Blank rows = not tested)  LUMBAR SPECIAL TESTS:  Straight leg raise test: Positive and Thomas test: Positive  FUNCTIONAL TESTS:  5 times sit to stand: 17 seconds Timed up and go (TUG): 18 seconds  GAIT: Distance walked: 100 feet Assistive device utilized: Single point cane Level of assistance: Modified independence Comments: slow and very stiff in the pelvis  TREATMENT DATE:  04/05/24  Evaluation and HEP  PATIENT EDUCATION:  Education details: HEP/POC Person educated: Patient Education method: Programmer, Multimedia, Facilities Manager, Actor cues, Verbal cues, and Handouts Education comprehension: verbalized understanding  HOME EXERCISE PROGRAM: Access Code: [email or web address] URL: [email or web address] Date: 04/05/2024 Prepared by: Ozell Mainland  Exercises - Supine Bridge  - 2 x daily - 7 x weekly - 1 sets - 10 reps - 3 hold - Supine Lower Trunk Rotation  - 2 x daily - 7 x weekly - 1 sets - 10 reps - 3 hold - Hooklying Single Knee to Chest Stretch  - 2 x daily - 7 x weekly - 1 sets - 10 reps - 5 hold - Supine Double Knee to Chest  - 2 x daily - 7 x weekly - 3 sets - 10 reps - 5 hold - Supine Piriformis Stretch Pulling Heel to Hip  - 1 x daily - 7 x weekly - 1 sets - 10 reps - 5 hold - Supine Hip Adductor Stretch  - 2 x daily - 7 x weekly - 1 sets - 10 reps - 20 hold  ASSESSMENT:  CLINICAL IMPRESSION: Patient is a 74 y.o. female who was seen today for physical therapy evaluation and treatment for weakness and difficulty walking with some back and leg pain, she had a surgery for endometrial cancer in May 2025 and has finished chemo recently, recent MRI showed not mets.  She reports that she struggles to walk, he takes small shuffling steps, very stiff in the pelvis, back and legs, very tight mms.  Hips are weak, has a goal to be able to travel by the fall to [place]   OBJECTIVE IMPAIRMENTS: Abnormal gait, cardiopulmonary status limiting activity, decreased activity tolerance, decreased balance, decreased endurance, decreased mobility, difficulty walking, decreased ROM, decreased strength, increased fascial restrictions, increased muscle spasms, impaired flexibility, improper body mechanics, postural dysfunction, and pain.   REHAB POTENTIAL: Good  CLINICAL DECISION MAKING: Stable/uncomplicated  EVALUATION COMPLEXITY: Low   GOALS: Goals reviewed with patient? Yes  SHORT TERM GOALS: Target date: 04/30/24  Independent with initial HEP Baseline: Goal status: INITIAL  LONG TERM GOALS: Target date: 07/04/24  Independent with advanced HEP Baseline:  Goal status: INITIAL  2.  Understand posture and body mechanics Baseline:  Goal status: INITIAL  3.  Decrease TUG time to 13 seconds Baseline: 18 s Goal status:  INITIAL  4.  Increase hip strength to 4+/5 Baseline: 4- Goal status: INITIAL  5.  Tolerate waling 1 ,ile without pain > 4/10 Baseline: 300 feet and pain up to 7/10 Goal status: INITIAL  PLAN:  PT FREQUENCY: 2x/week  PT DURATION: 12 weeks  PLANNED INTERVENTIONS: 97164- PT Re-evaluation, 97110-Therapeutic exercises, 97530- Therapeutic activity, 97112- Neuromuscular re-education, 97535- Self Care, [id number]- Manual therapy, [id number]- Electrical stimulation (unattended), 97035- Ultrasound, 02987- Traction (mechanical), Patient/Family education, Taping, Joint mobilization, Spinal mobilization, Cryotherapy, and Moist heat.  PLAN FOR NEXT SESSION: gym activities, flexibility, strength and balance   Stepehn Eckard W, PT 04/05/2024, 1:58 PM  "

## 2024-04-17 ENCOUNTER — Ambulatory Visit: Admitting: Physical Therapy

## 2024-04-19 ENCOUNTER — Ambulatory Visit: Admitting: Physical Therapy

## 2024-04-20 ENCOUNTER — Telehealth: Payer: Self-pay | Admitting: Family

## 2024-04-20 ENCOUNTER — Other Ambulatory Visit (HOSPITAL_BASED_OUTPATIENT_CLINIC_OR_DEPARTMENT_OTHER): Payer: Self-pay

## 2024-04-20 ENCOUNTER — Other Ambulatory Visit (HOSPITAL_BASED_OUTPATIENT_CLINIC_OR_DEPARTMENT_OTHER): Payer: Self-pay | Admitting: Family

## 2024-04-20 DIAGNOSIS — I10 Essential (primary) hypertension: Secondary | ICD-10-CM

## 2024-04-20 MED ORDER — BYSTOLIC 20 MG PO TABS
1.0000 | ORAL_TABLET | Freq: Every day | ORAL | 1 refills | Status: AC
  Filled 2024-04-20: qty 90, 90d supply, fill #0

## 2024-04-20 NOTE — Telephone Encounter (Signed)
 Refilled Bystolic .  Sent over phone note to Cardiologist / team for Entresto  problem.

## 2024-04-20 NOTE — Telephone Encounter (Signed)
 Left message for patient to call back

## 2024-04-20 NOTE — Telephone Encounter (Signed)
" °*  STAT* If patient is at the pharmacy, call can be transferred to refill team.   1. Which medications need to be refilled? (please list name of each medication and dose if known)  BYSTOLIC  20 MG TABS   2. Which pharmacy/location (including street and city if local pharmacy) is medication to be sent to? [facility]   3. Do they need a 30 day or 90 day supply? 90 day   Pt is out of medication   Pt c/o medication issue:  1. Name of Medication: sacubitril -valsartan  (ENTRESTO ) 97-103 MG   2. How are you currently taking this medication (dosage and times per day)? As written  3. Are you having a reaction (difficulty breathing--STAT)? no  4. What is your medication issue? Pt states the above medication is causing her to have vision problems, tired. Pt would like a c/b regarding this matter. Please advise  "

## 2024-04-24 ENCOUNTER — Ambulatory Visit: Admitting: Physical Therapy

## 2024-04-26 ENCOUNTER — Ambulatory Visit: Admitting: Physical Therapy

## 2024-04-30 ENCOUNTER — Encounter (HOSPITAL_BASED_OUTPATIENT_CLINIC_OR_DEPARTMENT_OTHER): Admitting: Family

## 2024-05-01 ENCOUNTER — Ambulatory Visit: Admitting: Physical Therapy

## 2024-05-08 ENCOUNTER — Ambulatory Visit: Admitting: Physical Therapy

## 2024-06-12 ENCOUNTER — Ambulatory Visit: Admitting: Family Medicine
